# Patient Record
Sex: Male | Born: 1965 | Race: Black or African American | Hispanic: No | Marital: Single
Health system: Midwestern US, Community
[De-identification: ages and names within clinical notes are randomized; demographics above are authoritative.]

## PROBLEM LIST (undated history)

## (undated) ENCOUNTER — Emergency Department: Payer: Medicaid Other

## (undated) DIAGNOSIS — G459 Transient cerebral ischemic attack, unspecified: Secondary | ICD-10-CM

## (undated) DIAGNOSIS — F1010 Alcohol abuse, uncomplicated: Secondary | ICD-10-CM

## (undated) DIAGNOSIS — I5189 Other ill-defined heart diseases: Secondary | ICD-10-CM

## (undated) DIAGNOSIS — I10 Essential (primary) hypertension: Secondary | ICD-10-CM

## (undated) DIAGNOSIS — F1410 Cocaine abuse, uncomplicated: Secondary | ICD-10-CM

## (undated) DIAGNOSIS — F259 Schizoaffective disorder, unspecified: Secondary | ICD-10-CM

## (undated) DIAGNOSIS — I2699 Other pulmonary embolism without acute cor pulmonale: Secondary | ICD-10-CM

## (undated) DIAGNOSIS — E119 Type 2 diabetes mellitus without complications: Secondary | ICD-10-CM

## (undated) DIAGNOSIS — Z720 Tobacco use: Secondary | ICD-10-CM

## (undated) DIAGNOSIS — I639 Cerebral infarction, unspecified: Secondary | ICD-10-CM

## (undated) DIAGNOSIS — F250 Schizoaffective disorder, bipolar type: Secondary | ICD-10-CM

---

## 2013-03-12 LAB — METABOLIC PANEL, COMPREHENSIVE
A-G Ratio: 0.9 — ABNORMAL LOW (ref 1.0–3.1)
ALT (SGPT): 37 U/L (ref 12.0–78.0)
AST (SGOT): 16 U/L (ref 15–37)
Albumin: 3.4 g/dL (ref 3.40–5.00)
Alk. phosphatase: 130 U/L — ABNORMAL HIGH (ref 46–116)
Anion gap: 12 mmol/L (ref 10–17)
BUN/Creatinine ratio: 19 (ref 6.0–20.0)
BUN: 23 MG/DL — ABNORMAL HIGH (ref 7–18)
Bilirubin, total: 0.1 MG/DL — ABNORMAL LOW (ref 0.20–1.00)
CO2: 28 mmol/L (ref 21–32)
Calcium: 8.6 MG/DL (ref 8.5–10.1)
Chloride: 102 mmol/L (ref 98–107)
Creatinine: 1.2 MG/DL (ref 0.6–1.3)
GFR est AA: >60 mL/min/{1.73_m2} (ref >60)
GFR est non-AA: >60 mL/min/{1.73_m2} (ref >60)
Globulin: 3.6 g/dL
Glucose: 183 mg/dL — ABNORMAL HIGH (ref 74–106)
Potassium: 4.2 mmol/L (ref 3.50–5.10)
Protein, total: 7 g/dL (ref 6.40–8.20)
Sodium: 138 mmol/L (ref 136–145)

## 2013-08-09 LAB — URINALYSIS W/ RFLX MICROSCOPIC
Bilirubin: NEGATIVE
Blood: NEGATIVE
Glucose: NEGATIVE mg/dL
Ketone: NEGATIVE mg/dL
Leukocyte Esterase: NEGATIVE
Nitrites: NEGATIVE
Protein: 30 mg/dL — AB
Specific gravity: 1.03
Urobilinogen: 0.2 EU/dL (ref 0.2–1.0)
pH (UA): 6 (ref 5.0–7.0)

## 2013-08-09 LAB — METABOLIC PANEL, COMPREHENSIVE
A-G Ratio: 1.2 (ref 1.0–3.1)
ALT (SGPT): 29 U/L (ref 12.0–78.0)
AST (SGOT): 22 U/L (ref 15–37)
Albumin: 3.6 g/dL (ref 3.40–5.00)
Alk. phosphatase: 88 U/L (ref 46–116)
Anion gap: 13 mmol/L (ref 10–17)
BUN/Creatinine ratio: 12 (ref 6.0–20.0)
BUN: 18 MG/DL (ref 7–18)
Bilirubin, total: 0.5 MG/DL (ref 0.20–1.00)
CO2: 27 mmol/L (ref 21–32)
Calcium: 8.3 MG/DL — ABNORMAL LOW (ref 8.5–10.1)
Chloride: 98 mmol/L (ref 98–107)
Creatinine: 1.5 MG/DL — ABNORMAL HIGH (ref 0.6–1.3)
GFR est AA: >60 mL/min/{1.73_m2} (ref >60)
GFR est non-AA: 53 mL/min/{1.73_m2} — ABNORMAL LOW (ref >60)
Globulin: 3 g/dL
Glucose: 127 mg/dL — ABNORMAL HIGH (ref 74–106)
Potassium: 4 mmol/L (ref 3.50–5.10)
Protein, total: 6.6 g/dL (ref 6.40–8.20)
Sodium: 134 mmol/L — ABNORMAL LOW (ref 136–145)

## 2013-08-09 LAB — CBC W/O DIFF
HCT: 38.2 % (ref 36.8–45.2)
HGB: 12.5 g/dL — ABNORMAL LOW (ref 12.8–15.0)
MCH: 29.6 PG (ref 27–31)
MCHC: 32.7 g/dL (ref 32–36)
MCV: 90.5 FL (ref 81–99)
MPV: 9.4 FL (ref 7.4–10.4)
PLATELET: 208 10*3/uL (ref 140–450)
RBC: 4.22 M/uL (ref 4.0–5.2)
RDW: 14 % (ref 11.5–14.5)
WBC: 11.2 10*3/uL — ABNORMAL HIGH (ref 4.8–10.8)

## 2013-08-09 LAB — URINE MICROSCOPIC
Casts: NONE SEEN /lpf
Crystals, urine: NONE SEEN /LPF

## 2013-08-13 ENCOUNTER — Inpatient Hospital Stay
Admit: 2013-08-13 | Discharge: 2013-08-15 | Payer: MEDICAID | Attending: Infectious Disease | Admitting: Infectious Disease

## 2013-08-13 DIAGNOSIS — A419 Sepsis, unspecified organism: Secondary | ICD-10-CM

## 2013-08-13 MED ORDER — IPRATROPIUM BROMIDE 0.02 % SOLN FOR INHALATION
0.02 % | RESPIRATORY_TRACT | Status: AC
Start: 2013-08-13 — End: 2013-08-13
  Administered 2013-08-13: via RESPIRATORY_TRACT

## 2013-08-13 MED ORDER — ALBUTEROL SULFATE 0.083 % (0.83 MG/ML) SOLN FOR INHALATION
2.5 mg /3 mL (0.083 %) | RESPIRATORY_TRACT | Status: AC
Start: 2013-08-13 — End: 2013-08-13
  Administered 2013-08-13: via RESPIRATORY_TRACT

## 2013-08-13 MED ORDER — DIPHENHYDRAMINE HCL 50 MG/ML IJ SOLN
50 mg/mL | INTRAMUSCULAR | Status: AC
Start: 2013-08-13 — End: 2013-08-13
  Administered 2013-08-14: 01:00:00 via INTRAVENOUS

## 2013-08-13 MED FILL — ALBUTEROL SULFATE 0.083 % (0.83 MG/ML) SOLN FOR INHALATION: 2.5 mg /3 mL (0.083 %) | RESPIRATORY_TRACT | Qty: 3

## 2013-08-13 MED FILL — IPRATROPIUM BROMIDE 0.02 % SOLN FOR INHALATION: 0.02 % | RESPIRATORY_TRACT | Qty: 2.5

## 2013-08-13 MED FILL — CLEOCIN 900 MG/50 ML IN 5 % DEXTROSE INTRAVENOUS PIGGYBACK: 900 mg/50 mL | INTRAVENOUS | Qty: 50

## 2013-08-13 NOTE — ED Notes (Addendum)
Evaluated by Dr. Guillermina CityW. Robinson. Awaiting PICC line placement by vascular access RN.

## 2013-08-13 NOTE — ED Notes (Signed)
Assumed care of pt.  Report rec'd from Laura G. RN.  No change in status. No new orders. VSS. NAD noted.  Pt resting c no c/o @ present time.  awaiting provider reeval/dispo.

## 2013-08-13 NOTE — Progress Notes (Signed)
PICC Placement Note    PRE-PROCEDURE VERIFICATION  Correct Procedure: yes  Correct Site:  yes  Temperature: Temp: 98.4 ??F (36.9 ??C), Temperature Source: Temp Source: Oral  No results for input(s): BUN, CREA, PLT, INR, WBC in the last 72 hours.    Invalid input(s): PLTEXT, INREXT, APTHR  Allergies: Fish derived; Captopril; Glyburide; Haldol; Lovastatin; and Metoprolol    Education materials for PICC Care given to family: no. See Patient Education activity for further details.  PICC Booklet placed on chart: yes    PROCEDURE DETAIL  START TIME:     20:01  STOP TIME:       20:10  Utilizing realtime ultrasound guidance.A double lumen PICC line was started for vascular access. The following documentation is in addition to the PICC properties in the lines/airways flowsheet :  Lot #: [id number]  xylocaine used: yes, 1ML  Mid-Arm Circumference: 51 (cm)  Internal Catheter Length: 48 (cm)  External Catheter Length: 1 (cm)  Total Catheter Length: 49 (cm)  Vein Selection for PICC:right brachial  Central Line Bundle followed yes  Complication Related to Insertion: none    The placement was verified by X-ray: yes. The x-ray results state the tip location is on the right side and the tip overlies the lower superior vena cava. YES    Line is okay to use: yes    FIDEL Retail buyerVECINO BSN, RN, [facility]-BC, CRNI

## 2013-08-13 NOTE — ED Notes (Signed)
Bloods drwan from PICC line. CXR done to confirm placement.

## 2013-08-13 NOTE — ED Notes (Signed)
[facility] to [facility] out [facility]. States he has had left lower leg pain and swelling for 4 days, has been getting abx and Lovenox without any relief.

## 2013-08-13 NOTE — ED Provider Notes (Signed)
HPI Comments: Willie Powell is a 48 y.o. male who [facility] of left leg pain and swelling onset 5 days ago. Pt states that he has taken abx twice with no relief. Denies hx of DVT or cellulitis.       Patient is a 48 y.o. male presenting with lower extremity edema. The history is provided by the patient and medical records.   Leg Swelling   This is a new problem. The current episode started more than 2 days ago (5 days ago). The problem occurs constantly. The problem has not changed since onset.The pain is present in the left lower leg. The pain is moderate. Treatments tried: antibiotics  The treatment provided no relief. There has been no history of extremity trauma.        Past Medical History   Diagnosis Date   ??? Asthma    ??? Hypertension    ??? Diabetes (HCC)    ??? Psychiatric disorder      bipolar   ??? Hyperlipemia         History reviewed. No pertinent past surgical history.      History reviewed. No pertinent family history.     History     Social History   ??? Marital Status: SINGLE     Spouse Name: N/A     Number of Children: N/A   ??? Years of Education: N/A     Occupational History   ??? Not on file.     Social History Main Topics   ??? Smoking status: Never Smoker    ??? Smokeless tobacco: Not on file   ??? Alcohol Use: No   ??? Drug Use: No   ??? Sexual Activity: Not on file     Other Topics Concern   ??? Not on file     Social History Narrative   ??? No narrative on file                  ALLERGIES: Fish derived; Captopril; Glyburide; Haldol; Lovastatin; and Metoprolol      Review of Systems   Constitutional: Negative.  Negative for fever and chills.   HENT: Negative.  Negative for congestion and sore throat.    Respiratory: Negative.  Negative for cough and shortness of breath.    Cardiovascular: Positive for leg swelling (right leg). Negative for chest pain and palpitations.   Gastrointestinal: Negative.  Negative for nausea, vomiting, abdominal pain and diarrhea.    Genitourinary: Negative.  Negative for dysuria and frequency.   Musculoskeletal: Negative.  Negative for myalgias and arthralgias.        Right leg pain    Skin: Negative.  Negative for pallor and rash.   Allergic/Immunologic: Negative.  Negative for immunocompromised state.   Neurological: Negative.  Negative for weakness, light-headedness and headaches.   All other systems reviewed and are negative.      Filed Vitals:    08/13/13 1518   BP: 146/92   Pulse: 92   Temp: 98.1 ??F (36.7 ??C)   Resp: 18   Height: 5\' 7"  (1.702 m)   Weight: 124.739 kg (275 lb)   SpO2: 95%            Physical Exam   Constitutional: He is oriented to person, place, and time. He appears well-developed and well-nourished.   HENT:   Head: Normocephalic and atraumatic.   Eyes: Conjunctivae and EOM are normal. Pupils are equal, round, and reactive to light. Right  eye exhibits no discharge. Left eye exhibits no discharge. No scleral icterus.   Neck: Normal range of motion. Neck supple.   Cardiovascular: Normal rate, regular rhythm and normal heart sounds.  Exam reveals no gallop and no friction rub.    No murmur heard.  Pulmonary/Chest: Effort normal. No respiratory distress. He has wheezes (scattered, expiratory). He has no rales.   Abdominal: Soft. Bowel sounds are normal. He exhibits no distension. There is no tenderness. There is no rebound and no guarding.   Genitourinary:   Tenderness in left groin    Musculoskeletal: Normal range of motion. He exhibits edema.   edema to the distal portion of left lower leg up to the knee, hyperemia above the knee    Neurological: He is alert and oriented to person, place, and time.   Skin: Skin is warm and dry. Rash noted. No erythema.   Large urticarial plaques on lateral aspect of both elbows and upper arms laterally    Psychiatric: He has a normal mood and affect. His behavior is normal. Judgment and thought content normal.   Nursing note and vitals reviewed.       MDM   Number of Diagnoses or Management Options  Diagnosis management comments: 48 y.o. male with 4-5 day hx of progressive left foot, ankle and leg pain and edema. [facility] and was [facility] here for workup and [facility], Dr. Dwyane DeeAtnafu, was called and agrees to admit the pt. Incidentally, pt was found to have bronchial spasm. Pt has hx of asthma.     DDx  Cellulitis  DVT      Plan:   Labs: CBC, metabolic panel, lactic acid  UA  CT left leg   CXR  Abx: Vancomycin, Clindamycin  Benadryl   -----  5:41 PM  Documented by Ileene RubensLashaunda B Johnson, acting as a scribe for Dr. Johnna AcostaWalesia L Yerania Chamorro, MD      PROVIDER ATTESTATION:  11:43 PM    The entirety of this note, signed by me, accurately reflects all works, treatments, procedures, and medical decision making performed by me, Johnna AcostaWalesia L Arwen Haseley, MD.              Patient Progress  Patient progress: stable    Diagnostic Studies:  CXR 7:32 PM    Viewed by me     Interpreted by me   No acute disease    Abnormal : see below    Discussed with       Radiologist      Normal mediastinum  No infiltrates   Normal heart size   Interpretation: questionable borderline cardiomegaly, no focal infiltrate    Lab Data:  Labs Reviewed   CULTURE, BLOOD   CBC WITH AUTOMATED DIFF   METABOLIC PANEL, COMPREHENSIVE   LACTIC ACID, PLASMA   PROTHROMBIN TIME + INR   PTT   URINALYSIS W/ RFLX MICROSCOPIC   POC GLUCOSE         ED Course:   6:18 PM  Pt initially evaluated.     6:25 PM  Medical records reviwed. Sx began 4-5 days ago.     6:34 PM  Dr. Dwyane DeeAtnafu called. He wants a PICC line, double antibiotic therapy, CT of leg and admission.     6:41 PM  Spoke with PICC line access team. They agree to come down an put in a PICC line.     Procedures

## 2013-08-14 LAB — D-DIMER, QUANTITATIVE: D DIMER,DDMR: 1190 NG/ML — ABNORMAL HIGH (ref <400)

## 2013-08-14 LAB — HIV 1/2 SCREEN - L & D EXPOSURE
HIV RAPID SCR 1/2,RHIV12: NEGATIVE
HIV-1/2 rapid screen: NEGATIVE

## 2013-08-14 LAB — URINALYSIS W/ RFLX MICROSCOPIC
Bilirubin: NEGATIVE
Blood: NEGATIVE
Glucose: 100 mg/dL — AB
Ketone: NEGATIVE mg/dL
Leukocyte Esterase: NEGATIVE
Nitrites: NEGATIVE
Protein: NEGATIVE mg/dL
Specific gravity: 1.025 (ref 1.003–1.030)
Urobilinogen: 0.2 EU/dL (ref 0.2–1.0)
pH (UA): 6.5 (ref 5.0–7.0)

## 2013-08-14 LAB — EKG, 12 LEAD, INITIAL
Atrial Rate: 71 {beats}/min
Atrial Rate: 73 {beats}/min
Calculated P Axis: 38 degrees
Calculated P Axis: 47 degrees
Calculated R Axis: -11 degrees
Calculated R Axis: -15 degrees
Calculated T Axis: -2 degrees
Calculated T Axis: 0 degrees
Diagnosis: NORMAL
Diagnosis: NORMAL
P-R Interval: 174 ms
P-R Interval: 172 ms
Q-T Interval: 400 ms
Q-T Interval: 374 ms
QRS Duration: 104 ms
QRS Duration: 112 ms
QTC Calculation (Bezet): 434 ms
QTC Calculation (Bezet): 412 ms
Ventricular Rate: 71 {beats}/min
Ventricular Rate: 73 {beats}/min

## 2013-08-14 LAB — CBC WITH AUTOMATED DIFF
ABS. BASOPHILS: 0 10*3/uL (ref 0.0–0.2)
ABS. EOSINOPHILS: 0.2 10*3/uL (ref 0.0–0.7)
ABS. LYMPHOCYTES: 2.3 10*3/uL (ref 1.2–3.4)
ABS. MONOCYTES: 0.7 10*3/uL — ABNORMAL LOW (ref 1.1–3.2)
ABS. NEUTROPHILS: 4.5 10*3/uL (ref 1.4–6.5)
BASOPHILS: 0 % (ref 0–2)
EOSINOPHILS: 2 % (ref 0–5)
HCT: 34.6 % — ABNORMAL LOW (ref 36.8–45.2)
HGB: 11.7 g/dL — ABNORMAL LOW (ref 12.8–15.0)
IMMATURE GRANULOCYTES: 0.5 % (ref 0.0–5.0)
LYMPHOCYTES: 30 % (ref 16–40)
MCH: 29.1 PG (ref 27–31)
MCHC: 33.8 g/dL (ref 32–36)
MCV: 86.1 FL (ref 81–99)
MONOCYTES: 9 % (ref 0–12)
MPV: 9.4 FL (ref 7.4–10.4)
NEUTROPHILS: 59 % (ref 40–70)
PLATELET: 221 10*3/uL (ref 140–450)
RBC: 4.02 M/uL (ref 4.0–5.2)
RDW: 13 % (ref 11.5–14.5)
WBC: 7.7 10*3/uL (ref 4.8–10.8)

## 2013-08-14 LAB — METABOLIC PANEL, COMPREHENSIVE
A-G Ratio: 0.7 — ABNORMAL LOW (ref 1.0–3.1)
ALT (SGPT): 41 U/L (ref 12.0–78.0)
AST (SGOT): 35 U/L (ref 15–37)
Albumin: 2.9 g/dL — ABNORMAL LOW (ref 3.40–5.00)
Alk. phosphatase: 110 U/L (ref 46–116)
Anion gap: 12 mmol/L (ref 10–17)
BUN/Creatinine ratio: 11 (ref 6.0–20.0)
BUN: 12 MG/DL (ref 7–18)
Bilirubin, total: 0.3 MG/DL (ref 0.20–1.00)
CO2: 29 mmol/L (ref 21–32)
Calcium: 8.4 MG/DL — ABNORMAL LOW (ref 8.5–10.1)
Chloride: 100 mmol/L (ref 98–107)
Creatinine: 1.1 MG/DL (ref 0.6–1.3)
GFR est AA: >60 mL/min/{1.73_m2} (ref >60)
GFR est non-AA: >60 mL/min/{1.73_m2} (ref >60)
Globulin: 3.9 g/dL
Glucose: 135 mg/dL — ABNORMAL HIGH (ref 74–106)
Potassium: 3.2 mmol/L — ABNORMAL LOW (ref 3.50–5.10)
Protein, total: 6.8 g/dL (ref 6.40–8.20)
Sodium: 138 mmol/L (ref 136–145)

## 2013-08-14 LAB — GLUCOSE, POC
Glucose (POC): 152 mg/dL — ABNORMAL HIGH (ref 74–106)
Glucose (POC): 108 mg/dL — ABNORMAL HIGH (ref 74–106)
Glucose (POC): 150 mg/dL — ABNORMAL HIGH (ref 74–106)

## 2013-08-14 LAB — PTT: aPTT: 29.6 s (ref 24.5–31.6)

## 2013-08-14 LAB — LACTIC ACID: Lactic acid: 1.3 MMOL/L (ref 0.4–2.0)

## 2013-08-14 LAB — PROTHROMBIN TIME + INR
INR: 1.1
Prothrombin time: 11.1 s (ref 9.8–12.0)

## 2013-08-14 LAB — D DIMER: D-dimer: 1190 NG/ML — ABNORMAL HIGH (ref <400)

## 2013-08-14 MED ORDER — SODIUM CHLORIDE 0.9 % IJ SYRG
Freq: Once | INTRAMUSCULAR | Status: AC
Start: 2013-08-14 — End: 2013-08-13
  Administered 2013-08-14: 03:00:00 via INTRAVENOUS

## 2013-08-14 MED ORDER — IOPAMIDOL 61 % IV SOLN
300 mg iodine /mL (61 %) | Freq: Once | INTRAVENOUS | Status: AC
Start: 2013-08-14 — End: 2013-08-13
  Administered 2013-08-14: 03:00:00 via INTRAVENOUS

## 2013-08-14 MED ORDER — SODIUM CHLORIDE 0.9 % IV
10 gram | Freq: Two times a day (BID) | INTRAVENOUS | Status: DC
Start: 2013-08-14 — End: 2013-08-15
  Administered 2013-08-14: 21:00:00 via INTRAVENOUS

## 2013-08-14 MED ORDER — PHARMACY VANCOMYCIN NOTE
Status: DC | PRN
Start: 2013-08-14 — End: 2013-08-15

## 2013-08-14 MED ORDER — CLOTRIMAZOLE 1 % TOPICAL CREAM
1 % | CUTANEOUS | Status: AC
Start: 2013-08-14 — End: ?

## 2013-08-14 MED ADMIN — potassium chloride (K-DUR, KLOR-CON) SR tablet 40 mEq: ORAL | @ 21:00:00 | NDC 68084036011

## 2013-08-14 MED ADMIN — clindamycin (CLEOCIN) 900mg D5W 50mL IVPB (premix): INTRAVENOUS | @ 01:00:00 | NDC 00009338202

## 2013-08-14 MED ADMIN — cloNIDine HCl (CATAPRES) tablet 0.1 mg: ORAL | @ 21:00:00 | NDC 62584065711

## 2013-08-14 MED ADMIN — sodium chloride (NS) flush 5-10 mL: INTRAVENOUS | @ 21:00:00 | NDC 87701099893

## 2013-08-14 MED ADMIN — cloNIDine HCl (CATAPRES) tablet 0.1 mg: ORAL | @ 12:00:00 | NDC 62584065711

## 2013-08-14 MED ADMIN — dextrose 5% and 0.9% NaCl infusion: INTRAVENOUS | @ 12:00:00 | NDC 00338008904

## 2013-08-14 MED ADMIN — oxyCODONE-acetaminophen (PERCOCET) 5-325 mg per tablet 2 Tab: ORAL | @ 21:00:00 | NDC 68084035511

## 2013-08-14 MED ADMIN — oxyCODONE-acetaminophen (PERCOCET) 5-325 mg per tablet 2 Tab: ORAL | @ 12:00:00 | NDC 68084035511

## 2013-08-14 MED ADMIN — amLODIPine (NORVASC) tablet 10 mg: ORAL | @ 12:00:00 | NDC 68084026011

## 2013-08-14 MED ADMIN — 0.9% sodium chloride infusion: INTRAVENOUS | @ 16:00:00 | NDC 00338004904

## 2013-08-14 MED ADMIN — enoxaparin (LOVENOX) injection 120 mg: SUBCUTANEOUS | @ 16:00:00 | NDC 00703861021

## 2013-08-14 MED ADMIN — acetaminophen (TYLENOL) tablet 650 mg: ORAL | @ 12:00:00 | NDC 50580050110

## 2013-08-14 MED ADMIN — aspirin delayed-release tablet 81 mg: ORAL | @ 12:00:00 | NDC 00603002622

## 2013-08-14 MED ADMIN — sodium chloride (NS) flush 5-10 mL: INTRAVENOUS | @ 12:00:00 | NDC 87701099893

## 2013-08-14 MED ADMIN — oxyCODONE-acetaminophen (PERCOCET) 5-325 mg per tablet 2 Tab: ORAL | @ 16:00:00 | NDC 68084035511

## 2013-08-14 MED ADMIN — vancomycin (VANCOCIN) 1,000 mg in dextrose 5% (MBP/ADV) 250 mL adv: INTRAVENOUS | @ 01:00:00 | NDC 00409653501

## 2013-08-14 MED FILL — DEXTROSE 5% IN WATER (D5W) IV PIGGY BACK: 5 % | INTRAVENOUS | Qty: 250

## 2013-08-14 MED FILL — OXYCODONE-ACETAMINOPHEN 5 MG-325 MG TAB: 5-325 mg | ORAL | Qty: 2

## 2013-08-14 MED FILL — DEXTROSE 5% IN NORMAL SALINE IV: INTRAVENOUS | Qty: 1000

## 2013-08-14 MED FILL — PHARMACY VANCOMYCIN NOTE: Qty: 1

## 2013-08-14 MED FILL — TYLENOL 325 MG TABLET: 325 mg | ORAL | Qty: 2

## 2013-08-14 MED FILL — ENOXAPARIN 120 MG/0.8 ML SUB-Q SYRINGE: 120 mg/0.8 mL | SUBCUTANEOUS | Qty: 0.8

## 2013-08-14 MED FILL — CLONIDINE 0.1 MG TAB: 0.1 mg | ORAL | Qty: 1

## 2013-08-14 MED FILL — CARBAMAZEPINE 200 MG TAB: 200 mg | ORAL | Qty: 1

## 2013-08-14 MED FILL — BD POSIFLUSH NORMAL SALINE 0.9 % INJECTION SYRINGE: INTRAMUSCULAR | Qty: 10

## 2013-08-14 MED FILL — VANCOMYCIN 10 GRAM IV SOLR: 10 gram | INTRAVENOUS | Qty: 2000

## 2013-08-14 MED FILL — INSULIN ASPART 100 UNIT/ML INJECTION: 100 unit/mL | SUBCUTANEOUS | Qty: 1

## 2013-08-14 MED FILL — ASPIRIN 81 MG TAB, DELAYED RELEASE: 81 mg | ORAL | Qty: 1

## 2013-08-14 MED FILL — SODIUM CHLORIDE 0.9 % IV: INTRAVENOUS | Qty: 1000

## 2013-08-14 MED FILL — NORTRIPTYLINE 50 MG CAP: 50 mg | ORAL | Qty: 2

## 2013-08-14 MED FILL — AMLODIPINE 10 MG TAB: 10 mg | ORAL | Qty: 1

## 2013-08-14 MED FILL — KLOR-CON M20 MEQ TABLET,EXTENDED RELEASE: 20 mEq | ORAL | Qty: 2

## 2013-08-14 MED FILL — CLOTRIMAZOLE 1 % TOPICAL CREAM: 1 % | CUTANEOUS | Qty: 15

## 2013-08-14 MED FILL — ISOVUE-300  61 % INTRAVENOUS SOLUTION: 300 mg iodine /mL (61 %) | INTRAVENOUS | Qty: 100

## 2013-08-14 MED FILL — DIPHENHYDRAMINE HCL 50 MG/ML IJ SOLN: 50 mg/mL | INTRAMUSCULAR | Qty: 1

## 2013-08-14 NOTE — Progress Notes (Signed)
[facility] at 0330 am with a diagnosis of cellulitis and pain of the left lower leg. Patient has a history of Asthma, DM, HTN, Bipolar disorder.  Patient is alert and oriented x 3 lung sounds are clear upon ascultation.  IV PICC line in upper right arm, hep locked at this time.  Patient did not complain of pain, his leg has some swelling but is intact and open to air.  Patient has been oriented to the unit and the call light is within reach.  Will continue to monitor and follow plan of care already in place for this patient.

## 2013-08-14 NOTE — ED Notes (Signed)
No change in status. No new orders. VSS. NAD noted.  Pt resting c no c/o @ present time.  awaiting provider reeval/dispo.

## 2013-08-14 NOTE — H&P (Signed)
DATE OF ADMISSION: 08/14/2013    REASON FOR ADMISSION: Cellulitis of the right lower extremity with  lymphangitis and lymphadenitis on the same side.    HISTORY OF PRESENT ILLNESS: The patient is a 48 year old, African American  man. He has a past complicated medical history that includes hypertension,  obesity, diabetes mellitus, and depression with no suicidal or homicidal  ideation. He was complaining of pain in the right groin area 2 weeks ago.  Later it was followed by swelling of the lower extremity. [facility] He was started on oral antibiotic. Despite that, the  selling continued to get worse and got worse. I was consulted  on telemedicine about him. We put him in the infirmary, started him on  vancomycin. However, because of lack of IV access, he was switched to oral  Zyvox. Despite that, over the weekend, Dr. Jill PolingSisay, and on yesterday, Dr.  Girtha HakeAyalew told me that he was not doing good. Because of that, he was sent to the ER.  I discussed the case with Dr. Guillermina CityW. Robinson in the emergency room. She was  also concerned for a possible necrotizing infection. CAT scan was done and  it was not revealing a necrotizing infection. He will have venous Doppler.  The right leg is by far swollen than the left one. He started already from  the get go on Lovenox 120 mg subcutaneous daily so far with no sign of  bleeding. I will continue the IV vancomycin as he has a PICC line now. He  is also on clindamycin as well. Based on the venous Doppler, will make a  final decision later. All of his symptoms are associated with fever and  chills. On admission, he was tachycardic.    PAST MEDICAL HISTORY: Diabetes mellitus, hypertension, obesity, depression  and dyslipidemia.    PAST SURGICAL HISTORY: None.    MEDICATIONS AS AN OUTPATIENT  Include:  1. Amlodipine.  2. Aspirin.  3. Tegretol.  4. Clonidine.  5. Lovenox.  6. Zyvox.  7. NovoLog sliding scale.  8. Nortriptyline.  9. Senna.  10. Zocor.   11. Levemir.    ALLERGIES TO MEDICATIONS  1. FISH.  2. CAPTOPRIL.  3. GLYBURIDE.  4. HALDOL.  5. LOVASTATIN.  6. METOPROLOL.    PERSONAL HISTORY: He is single. He has 4 kids; they are all healthy. He  used to smoke cigarettes and socially drink alcohol, and quit in 1989.  Denies use of any drugs or intravenous drugs years ago.    PERSONAL HISTORY: Both his parents, his 4 brothers, and 1 sister are all  living. Denied any medical problem including clot forming or pulmonary  embolism.    REVIEW OF SYSTEMS: A 14-point review of systems asked and answered, and the  pertinent findings are the ones I mentioned in HPI.    PHYSICAL EXAMINATION  GENERAL: Not acutely sick-looking man, comfortable, lying on bed in no form  of cardiorespiratory distress.  VITAL SIGNS: Blood pressure 147/102 mmHg, pulse of 74 per minute,  respiratory rate of 20 per minute, saturating 94% to 100% on room air with  a T-max of 98.5 degrees Fahrenheit.  SKIN: Warm, dry. No rash or itch mark noted.  HEAD: Atraumatic, normocephalic.  EYES: Pink conjunctivae, anicteric sclerae. Extraocular muscles were  intact.  ENT: No sinus tenderness. No stuffy nose. Clear oropharynx and poor  dentition.  NECK: Supple. No JVP. No carotid bruit. No thyromegaly. No  lymphadenopathy.  AXILLAE: Free  of lymphadenopathy.  CHEST: Symmetrical, resonant, clear to auscultation.  HEART: S1, S2 regular. There was no gallop or murmur or rub.  ABDOMEN: Obese. It moves with respiration. It is soft, nontender. Bowel  sounds active. No mass or organomegaly.  GENITOURINARY: No CVA or suprapubic tenderness. No focal tenderness along  the spine and in the inguinal area. The left side of the inguinal area is  normal. The right side inguinal area is puffy and I could not feel any  lymphadenopathy; however, the area is very tender and a little bit warm and  red. There is sign of lymphangitis on the lower extremity on the right side   with massive swelling, erythema and warmth on the right side of the lower  extremity. Distal pulses are intact. Bilateral lower extremities athlete's  foot signs with cracking between the toes was noted.  NEUROLOGIC: He is awake, alert, oriented x3, and grossly nonfocal.    LABORATORY FINDINGS: His white blood cell count is 7.7, with 59%  neutrophils, 30% lymphocytes, 9% monocytes. Hemoglobin is 11.7, hematocrit  is 34.6, and platelet count is 221. Urinalysis is negative. INR is 1.1.  Sodium is 138, potassium is 3.2, bicarbonate is 29, glucose 135, BUN of 12,  creatinine 1.1, and bilirubin of 0.3, protein 6.8, albumin 2.9, ALT of 41,  lactic acid is 1.3.    CT scan of the lower extremity, still the results are pending. Chest x-ray  was reported as normal. A venous Doppler of the lower extremity is also  pending.    IMPRESSION  1. Sepsis.  2. Cellulitis with lymphangitis and lymphadenitis of the right lower  extremity.  3. Tinea pedis.  4. Obesity.  5. Hypertension.  6. Diabetes mellitus.  7. Dyslipidemia.  8. Hypokalemia.  9. Depression with no suicidal or homicidal ideation.    RECOMMENDATIONS  1. PICC line is already placed in.  2. IV hydration.  3. Will give him IV vancomycin and clindamycin.  4. Venous Doppler of the lower extremity.  5. Follow the CAT scan results.  6. Based on these results, there is a possibility that he may be discharged  as early as this afternoon.        Pauleen Goleman        Dictated byShelda Pal: [Shanard Treto]  Job#: [[id number]]: [[id number]]  DD: [08/14/2013 10:04 A]  DT: [08/14/2013 10:45 A]  Transcribed by: [[email or web address]]    cc:            Halina AndreasGedion Harolyn Cocker

## 2013-08-14 NOTE — Progress Notes (Signed)
Bedside and Verbal shift change report given to Patricia Drake RN (oncoming nurse) by ZAKIYA  KASUMBA-MUHANGI, RN (offgoing nurse). Report included the following information SBAR, Kardex and MAR.

## 2013-08-14 NOTE — Other (Signed)
Bedside and Verbal shift change report given to henya johnson rn (oncoming nurse) by patdrakern (offgoing nurse). Report included the following information SBAR and Kardex.   Pending discharge report have to be call to receiving facilty

## 2013-08-14 NOTE — H&P (Signed)
Admission note dictated # [id number]

## 2013-08-14 NOTE — Progress Notes (Signed)
Assumed care of this patient at 1915 in no acute distress. Pt is for discharge.  Ambulance to arrive shortly.  Ambulance was on back log due to the number of ambulance discharges that we had for these late discharges.  Pt with RUE DL PICC and [facility].  Report was given to [employer] RN.  Pt appears stable.  Recent low grade temp however pt has cellulitis and if for long term IV Vancomycin which has been started.  Safety maintained.  [facility].  Safety maintained.

## 2013-08-14 NOTE — Progress Notes (Signed)
Consult for Vancomycin Dosing by Pharmacy by Dr. Dwyane DeeAtnafu  Indication: Cellulitis with lymphangitis and Lymphadenitis of rt lower extremity  Target Level:10-15  Day of Therapy :2  Previous Regimen 1000 mg x 1 dose on 7/6   Last Level None   Other Current Antibiotics None   Significant Cultures None   Serum Creatinine Lab Results   Component Value Date/Time    CREATININE 1.10 08/13/2013 08:45 PM       Creatinine Clearance Estimated Creatinine Clearance: 104 mL/min (based on Cr of 1.1).   BUN Lab Results   Component Value Date/Time    BUN 12 08/13/2013 08:45 PM       WBC Lab Results   Component Value Date/Time    WBC 7.7 08/13/2013 08:45 PM       H/H Lab Results   Component Value Date/Time    HGB 11.7 08/13/2013 08:45 PM      Platelets Lab Results   Component Value Date/Time    PLATELET 221 08/13/2013 08:45 PM      Temp Temp: 97.7 ??F (36.5 ??C) ()       Dose administration notes:   Doses given appropriately as scheduled    Plan for level:Trough level on 17:30 pm on 7/8  Adjustments:  Start Vancomycin 2,000 mg q12h  Plan:  Continue to monitor

## 2013-08-14 NOTE — Other (Signed)
TRANSFER - OUT REPORT:    Verbal report given to [facility] RN(name) on Willie Powell  [facility] for routine progression of care.  Report consisted of patient???s Situation, Background, Assessment and   Recommendations(SBAR).     Information from the following report(s) SBAR, ED Summary, [facility] and Recent Results was reviewed with the receiving nurse.    Lines:   PICC Double Lumen 08/13/13 Right;Brachial (Active)        Opportunity for questions and clarification was provided.      Patient transported with:  Transport

## 2013-08-14 NOTE — Other (Signed)
Alert D-DIMER 1190. Dr Dwyane DeeAtnafu made aware order stat doppler. Pt left leg  remains red raised and swollen. Continue on lovenox  120 mg . Potassium 3.2 will receive k replacement.

## 2013-08-14 NOTE — Progress Notes (Signed)
Vascular lab preliminary report:      No evidence of DVT seen in the left lower extremity by duplex scan.

## 2013-08-14 NOTE — Discharge Summary (Signed)
Patient Discharge Summary    Willie Powell / [id number] DOB: 10/28/1965    Admitted 08/13/2013 Discharged: 08/14/2013     Patient Active Problem List   Diagnosis Code   ??? Cellulitis and abscess of leg, except foot 682.6   ??? Sepsis (HCC) 038.9, 995.91   ??? Hypokalemia 276.8     Plan:  Follow up as below  Vancomycin for 7 days  Elevate leg  ID consult on site  Lotrimin for 2 weeks for Athlet's foot    Procedures Done:  PICC INSERTION VASCULAR ACCESS TEAM  SALINE LOCK IV  INSERT PERIPHERAL IV  CT LOW EXT LT W CONT  XR CHEST PORT  XR CHEST PORT  DUPLEX LOWER EXT VENOUS LEFT  APPLY SEQUENTIAL COMPRESSION DEVICE  APPLY/MAINTAIN SEQUENTIAL COMPRESSION DEVICE  FULL CODE  IP CONSULT TO PHARMACY - VANCOMYCIN DOSING    Lab:  Recent Labs      08/13/13   2045   WBC  7.7   HGB  11.7*   HCT  34.6*   PLT  221     Recent Labs      08/13/13   2045   NA  138   K  3.2*   CL  100   CO2  29   GLU  135*   BUN  12   CREA  1.10   CA  8.4*   ALB  2.9*   SGOT  35   ALT  41     No components found for: GLPOC  No results for input(s): PH, PCO2, PO2, HCO3, FIO2 in the last 72 hours.  Recent Labs      08/13/13   2045   INR  1.1     Culture  No results found for: SDES, SREQ, CULT, RPT, GMS    Urine Culture  COLOR   Date Value Ref Range Status   08/13/2013 YELLOW   Final     APPEARANCE   Date Value Ref Range Status   08/13/2013 CLEAR   Final     SPECIFIC GRAVITY   Date Value Ref Range Status   08/09/2013 1.030   Final     PH (UA)   Date Value Ref Range Status   08/13/2013 6.5 5.0 - 7.0   Final     PROTEIN   Date Value Ref Range Status   08/13/2013 NEGATIVE  NEG mg/dL Final     KETONE   Date Value Ref Range Status   08/13/2013 NEGATIVE  NEG mg/dL Final     BILIRUBIN   Date Value Ref Range Status   08/13/2013 NEGATIVE  NEG   Final     BLOOD   Date Value Ref Range Status   08/13/2013 NEGATIVE  NEG   Final     UROBILINOGEN   Date Value Ref Range Status   08/13/2013 0.2 0.2 - 1.0 EU/dL Final     NITRITES   Date Value Ref Range Status    08/13/2013 NEGATIVE  NEG   Final     LEUKOCYTE ESTERASE   Date Value Ref Range Status   08/13/2013 NEGATIVE  NEG   Final       Urine Micro  WBC   Date Value Ref Range Status   08/09/2013 3-5 0 /hpf Final     RBC   Date Value Ref Range Status   08/09/2013 0-3 0 /hpf Final     EPITHELIAL CELLS   Date Value Ref Range Status   08/09/2013 FEW /hpf Final  BACTERIA   Date Value Ref Range Status   08/09/2013 FEW /hpf Final     CASTS   Date Value Ref Range Status   08/09/2013 NONE SEEN /lpf Final     CRYSTALS   Date Value Ref Range Status   08/09/2013 NONE SEEN /LPF Final     Radiology Studies:  Ct Low Ext Lt W Cont    08/14/2013   EXAM:  CT LOW EXT LT W CONT dated 08/13/2013 11:21 PM  INDICATION:   cellulitis of entire leg  COMPARISON:  None.  TECHNIQUE: Spiral CT through the left lower extremity from mid thigh to the foot was performed. Contrast material was     administered. 100 cc Isovue-300. 2.5 mm axial images with sagittal and coronal reconstructions produced. Preliminary report was given at the time of the exam.   FINDINGS:  There is diffuse subcutaneous edema consistent with the given diagnosis of cellulitis. This is mostly noted in the lower leg and foot. No focal fluid collection to indicate abscess. No distinct involvement of the deeper muscular tissues. No abnormal enhancement. The bones are unremarkable. There is a  tug lesion from the posterior distal femoral shaft at tendon insertion site.     08/14/2013   IMPRESSION: Diffuse subcutaneous edema. No focal abscess.     Xr Chest Port    08/14/2013   EXAM:  XR CHEST PORT dated 08/13/2013 8:19 PM  INDICATION:  Right Arm PICC placement, tip confirmation  COMPARISON: 08/13/13 at 1822 hrs.  FINDINGS: A portable AP erect radiograph of the chest was obtained at 1947 hours.      A PICC has been placed from the right. The tip is not well seen as it is projected over the spine. The tip appears to be in the lower SVC. Lungs remain clear. Mediastinum normal. No effusion.          08/14/2013   IMPRESSION:  No evidence of active disease. PICC tip not well seen but appears to be in the lower SVC.     Xr Chest Port    08/14/2013   EXAM:  XR CHEST PORT dated 08/13/2013 6:53 PM  INDICATION:  bronchospasm, chills  COMPARISON: None  FINDINGS: A portable AP erect radiograph of the chest was obtained at 1822 hours.      The lungs are normally expanded and clear of acute infiltrate.     Mediastinal structures are unremarkable. There is no effusion. Visualized bones are normal.              08/14/2013   IMPRESSION:  No evidence of active disease.         Isolation:   There are currently no Active Isolations    Lines:   Central Line    Allergy:   Allergies   Allergen Reactions   ??? Fish Derived Anaphylaxis   ??? Captopril Unknown (comments)   ??? Glyburide Other (comments)     Headache     ??? Haldol [Haloperidol Lactate] Unknown (comments)   ??? Lovastatin Other (comments)     Chest tightness   ??? Metoprolol Unknown (comments)     Medications:  Current Facility-Administered Medications   Medication Dose Route Frequency   ??? amLODIPine (NORVASC) tablet 10 mg  10 mg Oral DAILY   ??? aspirin delayed-release tablet 81 mg  81 mg Oral DAILY   ??? carBAMazepine (TEGretol) tablet 200 mg  200 mg Oral TID   ??? cloNIDine HCl (CATAPRES) tablet 0.1 mg  0.1 mg Oral  TID   ??? nitroglycerin (NITROSTAT) tablet 0.4 mg  0.4 mg SubLINGual Q5MIN PRN   ??? nortriptyline (PAMELOR) capsule 100 mg  100 mg Oral QHS   ??? simvastatin (ZOCOR) tablet 40 mg  40 mg Oral QHS   ??? senna (SENOKOT) tablet 8.6 mg  1 Tab Oral DAILY   ??? PERCOCET 5-325 mg per tablet 2 Tab  2 Tab Oral Q4H PRN   ??? insulin aspart (NOVOLOG) injection   SubCUTAneous AC&HS   ??? clotrimazole (LOTRIMIN) 1 % cream   Topical BID   ??? Vancomycin 2,000 mg in NS 500 mL IVPB  2,000 mg IntraVENous Q12H for 7 days       Recommended Diet:   Regular Diet    Recommended Activity:   Activity as tolerated    Follow up:  Follow-up Appointments   Procedures    ??? FOLLOW UP VISIT Appointment in: Other (Specify) Follow up with Primary Care Provider in 1-2 days.     Follow up with Primary Care Provider in 1-2 days.     Standing Status: Standing      Number of Occurrences: 1      Standing Expiration Date: 08/15/2013     Order Specific Question:  Appointment in     Answer:  Other (Specify)   ??? FOLLOW UP VISIT Appointment in: One Week ID specialist consult in one week     ID specialist consult in one week     Standing Status: Standing      Number of Occurrences: 1      Standing Expiration Date: 08/15/2013     Order Specific Question:  Appointment in     Answer:  One Week   ??? FOLLOW UP VISIT Appointment in: Other (Specify) CBC, CMP, Mg Q week for 2 weeks     CBC, CMP, Mg Q week for 2 weeks     Standing Status: Standing      Number of Occurrences: 1      Standing Expiration Date: 08/15/2013     Order Specific Question:  Appointment in     Answer:  Other [facility](Specify)     Hospital Course:  Willie Powell is a 48 y.o. year-old BLACK [place] male   He has a past complicated medical history that includes hypertension,  obesity, diabetes mellitus, and depression with no suicidal or homicidal  ideation. He was complaining of pain in the right groin area 2 weeks ago.  Later it was followed by swelling of the lower extremity. [facility] He was started on oral antibiotic. Despite that, the  selling continued to get worse and got worse. I was consulted  on telemedicine about him. We put him in the infirmary, started him on  vancomycin. However, because of lack of IV access, he was switched to oral  Zyvox. Despite that, over the weekend, Dr. Jill PolingSisay, and on yesterday, Dr.  Girtha HakeAyalew told me that he was not doing good. Because of that, he was sent to the ER.  I discussed the case with Dr. Guillermina CityW. Robinson in the emergency room. She was  also concerned for a possible necrotizing infection. CAT scan was done and   it was not revealing a necrotizing infection. Venous Doppler study was negative too.  I think he has bad cellulitis due to his athlete's foot and need to be treated aggressively.     A comprehensive review of systems was negative except for that written in the  HPI.     Physical Exam:     Patient Vitals for the past 12 hrs:   Temp Pulse Resp BP SpO2   08/14/13 1550 98.2 ??F (36.8 ??C) 79 18 128/85 mmHg 95 %   08/14/13 1134 97.7 ??F (36.5 ??C) 71 20 127/80 mmHg 96 %   08/14/13 0744 98 ??F (36.7 ??C) 73 20 147/102 mmHg 94 %       GENERAL: In no acute distress, comfortable, lying in bed.   SKIN: Warm, moist. Normal skin turgor. No rash or scratch mark noted.   HEENT: Normocephalic, Atraumatic,   Pink conjunctiva, Anicteric, EOMI,   No sinus tenderness or stuffy nose and   Clear & moist oral mucosa.   NECK: Supple, no JVD, no thyroidmegammy or lymphadenopathy.   AXILLA: No Lymphadenopathy.   LUNGS: Symmetrical, Resonant and CTA BL. No wheezes, rales,or rhonchi,   CARDIAC: S1 & S2, regular rate and rhythmn. No murmurs, rubs or gallops heard   ABDOMEN: Moves with respiration. Soft, NT, BS +, & no mass or organomegaly.   INGUINAL: No hernia or lymphadenopathy.   GUS: Genitals grossly intact. No supra pubic area tenderness. No CVAT.   EXTREM.: No leg edema and distal pulses intact on BL DP.  NEURO: AOX3, No obvious focal deficits. Sensation and strength grossly intact.   No astexis, Clonus or Babinski noted.     CODE STATUS:  Full code.    I have discussed the diagnosis and the treatment plan as well as the prognosis with the patient. I have discussed the type and duration of antibiotics to be administered. Potential side effects of these antibiotics like C. Diff colitis has been discussed with the patient as well.   Advised to be compliant with meds and follow ups.   Risks including death has been explained.   It is important that you take the medication exactly as they are prescribed.    Do not take other medications without consulting your doctor.   If you experience any worsening of your symptoms, please follow up with PMD  ASAP.      Signed By: Halina Andreas, MD     August 14, 2013

## 2013-08-15 LAB — GLUCOSE, POC: Glucose (POC): 174 mg/dL — ABNORMAL HIGH (ref 74–106)

## 2013-08-15 MED ADMIN — cloNIDine HCl (CATAPRES) tablet 0.1 mg: ORAL | @ 02:00:00 | NDC 62584065711

## 2013-08-15 MED ADMIN — oxyCODONE-acetaminophen (PERCOCET) 5-325 mg per tablet 2 Tab: ORAL | @ 02:00:00 | NDC 68084035511

## 2013-08-15 MED ADMIN — simvastatin (ZOCOR) tablet 40 mg: ORAL | @ 03:00:00 | NDC 68084051311

## 2013-08-15 MED ADMIN — nortriptyline (PAMELOR) capsule 100 mg: ORAL | @ 03:00:00 | NDC 00093081201

## 2013-08-15 MED ADMIN — sodium chloride (NS) flush 5-10 mL: INTRAVENOUS | @ 03:00:00 | NDC 87701099893

## 2013-08-15 MED FILL — CARBAMAZEPINE 200 MG TAB: 200 mg | ORAL | Qty: 1

## 2013-08-15 MED FILL — OXYCODONE-ACETAMINOPHEN 5 MG-325 MG TAB: 5-325 mg | ORAL | Qty: 2

## 2013-08-15 MED FILL — SIMVASTATIN 40 MG TAB: 40 mg | ORAL | Qty: 1

## 2013-08-15 MED FILL — CLONIDINE 0.1 MG TAB: 0.1 mg | ORAL | Qty: 1

## 2013-08-15 NOTE — Other (Signed)
Utilization Review          Systemic or Infectious Condition [facility] - Care Day 1 (08/14/2013) by Artist Pais, RN     08/14/2013 8:51 PM EDT by CABELL, CABELL      Subject: Admission     08/14/2013  v/s 98.1 146/92 92 18 95%  labs d dimer=1190  med note=48 y/o complaining of pain in the right groin area 2 weeks ago.Later it was followed by swelling of the lower extremity. He was [facility]. He was started on oral antibiotic. Despite that, theselling continued to get worse and got worse. However, because of lack of IV access, he was switched to oral Zyvox. Despite that, over the weekend, Dr. Jill Poling, and on yesterday, Dr.Ayalew told me that he was not doing good. Because of that, he was sent to the ER.I discussed the case with Dr. Guillermina City in the emergency room. She wasalso concerned for a possible necrotizing infection. CAT scan was done andit was not revealing a necrotizing infection. He will have venous Doppler.The right leg is by far swollen than the left one. I will continue the IV vancomycin as he has a PICC line now. He is also on clindamycin as well. Based on the venous Doppler, will make a final decision later.

## 2013-08-15 NOTE — Progress Notes (Signed)
[facility].  Pt leaving with RUE DL PICC for IV Vancomycin.  Discharge papers sent with ambo crew.  Safety maintained.

## 2013-08-17 LAB — METABOLIC PANEL, COMPREHENSIVE
A-G Ratio: 0.7 — ABNORMAL LOW (ref 1.0–3.1)
ALT (SGPT): 90 U/L — ABNORMAL HIGH (ref 12.0–78.0)
AST (SGOT): 60 U/L — ABNORMAL HIGH (ref 15–37)
Albumin: 2.8 g/dL — ABNORMAL LOW (ref 3.40–5.00)
Alk. phosphatase: 111 U/L (ref 46–116)
Anion gap: 12 mmol/L (ref 10–17)
BUN/Creatinine ratio: 11 (ref 6.0–20.0)
BUN: 14 MG/DL (ref 7–18)
Bilirubin, total: 0.2 MG/DL (ref 0.20–1.00)
CO2: 30 mmol/L (ref 21–32)
Calcium: 8.2 MG/DL — ABNORMAL LOW (ref 8.5–10.1)
Chloride: 101 mmol/L (ref 98–107)
Creatinine: 1.3 MG/DL (ref 0.6–1.3)
GFR est AA: >60 mL/min/{1.73_m2} (ref >60)
GFR est non-AA: >60 mL/min/{1.73_m2} (ref >60)
Globulin: 4.1 g/dL
Glucose: 190 mg/dL — ABNORMAL HIGH (ref 74–106)
Potassium: 4.2 mmol/L (ref 3.50–5.10)
Protein, total: 6.9 g/dL (ref 6.40–8.20)
Sodium: 139 mmol/L (ref 136–145)

## 2013-08-17 LAB — VANCOMYCIN, TROUGH: Vancomycin,trough: 22.2 ug/mL — ABNORMAL HIGH (ref 15–20)

## 2013-08-17 LAB — GENTAMICIN, TROUGH: Gentamicin, trough: 0.1 ug/ml — ABNORMAL LOW (ref 1–2)

## 2014-10-13 ENCOUNTER — Emergency Department
Admission: EM | Admit: 2014-10-13 | Discharge: 2014-10-13 | Disposition: A | Discharge status: Home / Self Care | Payer: Self-pay | Attending: Emergency Medicine | Admitting: Emergency Medicine

## 2014-10-13 ENCOUNTER — Encounter: Payer: Self-pay | Admitting: Medical Oncology

## 2014-10-13 DIAGNOSIS — I10 Essential (primary) hypertension: Secondary | ICD-10-CM | POA: Insufficient documentation

## 2014-10-13 DIAGNOSIS — E119 Type 2 diabetes mellitus without complications: Secondary | ICD-10-CM | POA: Insufficient documentation

## 2014-10-13 DIAGNOSIS — Z794 Long term (current) use of insulin: Secondary | ICD-10-CM | POA: Insufficient documentation

## 2014-10-13 DIAGNOSIS — E1165 Type 2 diabetes mellitus with hyperglycemia: Secondary | ICD-10-CM | POA: Insufficient documentation

## 2014-10-13 HISTORY — DX: Essential (primary) hypertension: I10

## 2014-10-13 HISTORY — DX: Type 2 diabetes mellitus without complications: E11.9

## 2014-10-13 LAB — CBC
HCT: 41.3 % (ref 40.0–52.0)
Hemoglobin: 14 g/dL (ref 13.0–18.0)
MCH: 31.8 pg (ref 26.0–34.0)
MCHC: 34 g/dL (ref 32.0–36.0)
MCV: 93.6 fL (ref 80.0–100.0)
Platelets: 200 10*3/uL (ref 150–440)
RBC: 4.41 MIL/uL (ref 4.40–5.90)
RDW: 13.7 % (ref 11.5–14.5)
WBC: 5.9 10*3/uL (ref 3.8–10.6)

## 2014-10-13 LAB — URINALYSIS COMPLETE WITH MICROSCOPIC (ARMC ONLY)
BILIRUBIN URINE: NEGATIVE
Bacteria, UA: NONE SEEN
Hgb urine dipstick: NEGATIVE
KETONES UR: NEGATIVE mg/dL
Leukocytes, UA: NEGATIVE
Nitrite: NEGATIVE
Protein, ur: NEGATIVE mg/dL
Specific Gravity, Urine: 1.041 — ABNORMAL HIGH (ref 1.005–1.030)
pH: 5 (ref 5.0–8.0)

## 2014-10-13 LAB — BASIC METABOLIC PANEL
Anion gap: 8 (ref 5–15)
BUN: 15 mg/dL (ref 6–20)
CALCIUM: 9.2 mg/dL (ref 8.9–10.3)
CO2: 27 mmol/L (ref 22–32)
CREATININE: 1.21 mg/dL (ref 0.61–1.24)
Chloride: 103 mmol/L (ref 101–111)
GFR calc non Af Amer: >60 mL/min (ref >60)
Glucose, Bld: 293 mg/dL — ABNORMAL HIGH (ref 65–99)
Potassium: 3.8 mmol/L (ref 3.5–5.1)
Sodium: 138 mmol/L (ref 135–145)

## 2014-10-13 LAB — GLUCOSE, CAPILLARY
Glucose-Capillary: 278 mg/dL — ABNORMAL HIGH (ref 65–99)
Glucose-Capillary: 209 mg/dL — ABNORMAL HIGH (ref 65–99)

## 2014-10-13 MED ORDER — HYDROCHLOROTHIAZIDE 25 MG PO TABS
25.0000 mg | ORAL_TABLET | Freq: Every day | ORAL | Status: DC
  Administered 2014-10-13: 25 mg via ORAL
  Filled 2014-10-13: qty 1

## 2014-10-13 MED ORDER — INSULIN DETEMIR 100 UNIT/ML ~~LOC~~ SOLN: 5.0000 [IU] | Freq: Every day | SUBCUTANEOUS | Status: DC

## 2014-10-13 NOTE — ED Provider Notes (Addendum)
[facility] Emergency Department Provider Note REMINDER - THIS NOTE IS NOT A FINAL MEDICAL RECORD UNTIL IT IS SIGNED. UNTIL THEN, THE CONTENT BELOW MAY REFLECT INFORMATION FROM A DOCUMENTATION TEMPLATE, NOT THE ACTUAL PATIENT VISIT. ____________________________________________  Time seen: Approximately 8:53 PM  I have reviewed the triage vital signs and the nursing notes.   HISTORY  Chief Complaint Hyperglycemia    HPI Henry Munoz is a 49 y.o. male history of diabetes. Also hypertension.  Patient reports he moved from [place] recently, been on Levemir for about a year for his diabetes and his blood sugars usually run about 200 using 5 or 8 units of Levemir nightly, but he ran out on Friday. He reports that he's been feeling increased thirst and more frequent urination since. He checked her blood sugar at home and it was elevated to over 400, and 1 reading greater than 500.  He comes without any other symptoms, denies any fevers or chills. No nausea or vomiting. He requested that they have a refill for Levemir and is seeking a primary care doctor.  Discussed blood pressure being elevated, the patient knows that he does have elevated blood pressure but did not take his medicine today. No headaches. No chest pain. No trouble breathing. No vision changes.  Past Medical History  Diagnosis Date  . Hypertension   . Diabetes mellitus without complication     There are no active problems to display for this patient.   History reviewed. No pertinent past surgical history.  Current Outpatient Rx  Name  Route  Sig  Dispense  Refill  . insulin detemir (LEVEMIR) 100 UNIT/ML injection   Subcutaneous   Inject 0.05 mLs (5 Units total) into the skin at bedtime.   10 mL   2     Allergies Review of patient's allergies indicates no known allergies.  No family history on file.  Social History Social History  Substance Use Topics  . Smoking status: Current  Every Day Smoker -- 0.50 packs/day    Types: Cigarettes  . Smokeless tobacco: None  . Alcohol Use: No    Review of Systems Constitutional: No fever/chills Eyes: No visual changes. ENT: No sore throat. Cardiovascular: Denies chest pain. Respiratory: Denies shortness of breath. Gastrointestinal: No abdominal pain.  No nausea, no vomiting.  No diarrhea.  No constipation. Genitourinary: Negative for dysuria. Increased urination. Increased thirst. Musculoskeletal: Negative for back pain. Skin: Negative for rash. Neurological: Negative for headaches, focal weakness or numbness.  10-point ROS otherwise negative.  ____________________________________________   PHYSICAL EXAM:  VITAL SIGNS: ED Triage Vitals  Enc Vitals Group     BP 10/13/14 1813 179/116 mmHg     Pulse Rate 10/13/14 1813 84     Resp 10/13/14 1813 20     Temp 10/13/14 1813 98.2 F (36.8 C)     Temp Source 10/13/14 1813 Oral     SpO2 10/13/14 1813 96 %     Weight 10/13/14 1813 280 lb (127.007 kg)     Height 10/13/14 1813 5\' 7"  (1.702 m)     Head Cir --      Peak Flow --      Pain Score --      Pain Loc --      Pain Edu? --      Excl. in GC? --    Constitutional: Alert and oriented. Well appearing and in no acute distress. Eyes: Conjunctivae are normal. PERRL. EOMI. Head: Atraumatic. Nose: No congestion/rhinnorhea. Mouth/Throat: Mucous membranes  are moist.  Oropharynx non-erythematous. Neck: No stridor.   Cardiovascular: Normal rate, regular rhythm. Grossly normal heart sounds.  Good peripheral circulation. Respiratory: Normal respiratory effort.  No retractions. Lungs CTAB. Gastrointestinal: Soft and nontender. No distention. No abdominal bruits. No CVA tenderness. Musculoskeletal: No lower extremity tenderness nor edema.  No joint effusions. Neurologic:  Normal speech and language. No gross focal neurologic deficits are appreciated. No gait instability. Skin:  Skin is warm, dry and intact. No rash  noted. Psychiatric: Mood and affect are normal. Speech and behavior are normal.  Essentially normal exam. Somewhat overweight. ____________________________________________   LABS (all labs ordered are listed, but only abnormal results are displayed)  Labs Reviewed  BASIC METABOLIC PANEL - Abnormal; Notable for the following:    Glucose, Bld 293 (*)    All other components within normal limits  URINALYSIS COMPLETEWITH MICROSCOPIC ([facility] ONLY) - Abnormal; Notable for the following:    Color, Urine YELLOW (*)    APPearance CLEAR (*)    Glucose, UA >500 (*)    Specific Gravity, Urine 1.041 (*)    Squamous Epithelial / LPF 0-5 (*)    All other components within normal limits  GLUCOSE, CAPILLARY - Abnormal; Notable for the following:    Glucose-Capillary 278 (*)    All other components within normal limits  GLUCOSE, CAPILLARY - Abnormal; Notable for the following:    Glucose-Capillary 209 (*)    All other components within normal limits  CBC  CBG MONITORING, ED   ____________________________________________  EKG   ____________________________________________  RADIOLOGY   ____________________________________________   PROCEDURES  Procedure(s) performed: None  Critical Care performed: No  ____________________________________________   INITIAL IMPRESSION / ASSESSMENT AND PLAN / ED COURSE  Pertinent labs & imaging results that were available during my care of the patient were reviewed by me and considered in my medical decision making (see chart for details).  Patient presents with polyuria and polydipsia he ran out of Levemir Friday. His blood sugar is less than 250, he has no anion gap, no evidence of acidosis or DKA.  I offered to place the patient back on insulin by starting in the ER, rather he would like to be given a prescription for his Levemir be discharged to fill it tonight. I discussed careful treatment of his diabetes, and follow up on his hypertension.  Patient is agreeable and will resume his normal home dosing of Levemir for which she states usually uses 5 units nightly, occasionally 8 units if his blood sugars are over 300.  Return precautions and signs and symptoms to return as well as signs and symptoms of hypoglycemia discussed with the patient is agreeable with plan.  I referred him to follow-up with [place] health care to assist in selecting an area primary care whom his insurance may be covered under. I have given information to follow-up with [facility] as well. ____________________________________________   FINAL CLINICAL IMPRESSION(S) / ED DIAGNOSES  Final diagnoses:  Hyperglycemia due to type 2 diabetes mellitus      Sharyn Creamer, MD 10/13/14 2056  Patient's blood pressure again noted to be quite elevated at the time of discharge. I discussed with the patient, he reports that he does have his blood pressure medication but he has not been taking it for the last 2 days. I offered him further blood pressure lowering, and discussed the risks of discharge with elevated blood pressure and the need for close follow-up. Patient agreeable to taking a blood pressure pill here before going  home, and understands the need for close follow-up. He reports that he does take his blood pressure, and is normally well controlled and he will follow-up closely and continue take his medication which she has been off the last 2 days. He does not require prescription for his HCTZ. No evidence of hypertensive emergency/urgency in the ER.  Sharyn Creamer, MD 10/13/14 2121

## 2014-10-13 NOTE — Discharge Instructions (Signed)
Please continue Levemir. Return to the emergency room right away if your blood sugar is persistently higher than 300, he developed nausea or vomiting, feel you're urinating frequently again, he feel lightheaded or shaky, or other new concerns arise.  Your blood pressure is also elevated, and I suggest she get close follow-up with primary care for repeat blood pressure monitoring and establishing care for diabetes care.  Insulin Treatment for Diabetes Diabetes is a disease that does not go away (chronic). It occurs when the body does not properly use the sugar (glucose) that is released from food after it is digested. Glucose levels are controlled by a hormone called insulin, which is made by your pancreas. Depending on the type of diabetes you have, either of the following will apply:   The pancreas does not make any insulin (type 1 diabetes).  The pancreas makes too little insulin, and the body cannot respond normally to the insulin that is made (type 2 diabetes). Without insulin, death can occur. However, with the addition of insulin, blood sugar monitoring, and treatment, someone with diabetes can live a full and productive life. This document will discuss the role of insulin in your treatment and provide information about its use.  HOW IS INSULIN GIVEN? Insulin is a medicine that can only be given by injection. Taking it by mouth makes it inactive because of the acid in your stomach. Insulin is injected under the skin by a syringe and needle, an insulin pen, a pump, or a jet injector. Your dose will be determined by your health care provider based on your individual needs. You will also be given guidance on which method of giving insulin is right for you. Remember that if you give insulin with a needle and syringe, you must do so using only a special insulin syringe made for this purpose. WHERE ON THE BODY SHOULD INSULIN BE INJECTED? Insulin is injected into the fatty layer of tissue just under  your skin. Good places to inject insulin include the upper arm, the front and outer area of the thigh, the hips, and the abdomen. Giving your insulin in the abdomen is preferred because this provides the most rapid and consistent absorption. Avoid the area 2 inches (5 cm) around the navel and avoid injecting into areas on your body with scar tissue. In addition, it is important to rotate your injection sites with every shot to prevent irritation and improve absorption. WHAT ARE THE DIFFERENT TYPES OF INSULIN?  If you have type 1 diabetes, you must take insulin to stay alive. Your body does not produce it. If you have type 2 diabetes, you might require insulin in addition to, or instead of, other medicines. In either case, proper use of insulin is critical to control your diabetes.  There are a number of different types of insulin. Usually, you will give yourself injections, though others can be trained to give them to you. Some people have an insulin pump that delivers insulin continuously through a tube (cannula) that is placed under the skin. Using insulin requires that you check your blood sugar several times a day. The exact number of times and time of day to check will vary depending on your type of diabetes, your type of insulin, and treatment goals. Your health care provider will direct you.  Generally, different insulins have different properties. The following is a general guide. Specifics will vary by product, and new products are introduced periodically.   Rapid-acting insulin starts working quickly (in as little  as 5 minutes) and wears off in 4 to 6 hours (sometimes longer). This type of insulin works well when taken just before a meal to bring your blood sugar quickly back to normal.   Short-acting insulin starts working in about 30 minutes and can last 6 to 10 hours. This type of insulin should be taken about 30 minutes before you start eating a meal.  Intermediate-acting insulin starts  working in 1-2 hours and wears off after about 10 to 18 hours. This insulin will lower your blood sugar for a longer period of time, but it will not be as effective in lowering your blood sugar right after a meal.   Long-acting insulin mimics the small amount of insulin that your pancreas usually produces throughout the day. You need to have some insulin present at all times. It is crucial to the metabolism of brain cells and other cells. Long-acting insulin is meant to be used either once or twice a day. It is usually used in combination with other types of insulin, or in combination with other diabetes medicines.  Discuss the type of insulin you are taking with your health care provider or pharmacist. You will then be aware of when the insulin can be expected to peak and when it will wear off. This is important to know so you can plan for meal times and periods of exercise.  Your health care provider will usually have a strategy in mind when treating you with insulin. This will vary with your type of diabetes, your diabetes treatment goals, and your health history. It is important that you understand this strategy so you can be an active partner in treating your diabetes. Here are some terms you might hear:   Basal insulin. This refers to the small amount of insulin that needs to be present in your blood at all times. Sometimes oral medicines will be enough. For other people, and especially for people with type 1 diabetes, insulin is needed. Usually, intermediate-acting or long-acting insulin is used once or twice a day to accomplish this.   Prandial (meal-related) insulin. Your blood sugar will rise rapidly after a meal. Rapid-acting or short-acting insulin can be used right before the meal to bring your blood sugar back to normal quickly. You might be instructed to adjust the amount of insulin depending on how much carbohydrate (starch) is in your meal.   Corrective insulin. You might be instructed  to check your blood sugar at certain times of the day. You then might use a small amount of rapid-acting or short-acting insulin to bring the blood sugar down to normal if it is elevated.   Tight control (also called intensive therapy). Tight control means keeping your blood sugar as close to your target as possible and keeping it from going too high after meals. People with tight control of their diabetes are shown to have fewer long-term problems from their diabetes.   Glycohemoglobin (also called glyco, glycosylated hemoglobin, hemoglobin A1c, or A1c) level. This measures how well your blood sugar has been controlled during the past 1 to 3 months. It helps your health care provider see how effective your treatment is and decide if any changes are needed. Your health care provider will discuss your target glycohemoglobin level with you.  Insulin treatment requires your careful attention. Treatment plans will be different for different people. Some people do well with a simple program. Others require more complicated programs, with multiple insulin injections daily. You will work with your health  care provider to develop the best program for you. Regardless of your insulin treatment plan, you must also do your best on weight control, diet and food choices, exercise, blood pressure control, cholesterol control, and stress levels.  WHAT ARE THE SIDE EFFECTS OF INSULIN? Although insulin treatment is important, it does have some side effects, such as:   Insulin can cause your blood sugar to go too low (hypoglycemia).   Weight gain can occur.   Improper injection technique can cause hypoglycemia, blood sugar to go too high (hyperglycemia), skin injury or irritation, or other problems. You must learn to inject insulin properly. Document Released: 04/23/2008 Document Revised: 06/11/2013 Document Reviewed: 07/09/2012 [facility] Patient Information 2015 [place], [place]. This information is not intended to  replace advice given to you by your health care provider. Make sure you discuss any questions you have with your health care provider.

## 2014-10-13 NOTE — ED Notes (Signed)
Pt reports that he has been urinating more frequently than usual and has been excessively thirsty, states that he has DM but has not taken his insulin in over a year. States that he checked his sugar at home and it was in the 500's.

## 2014-10-13 NOTE — ED Notes (Signed)
Patient give Hydrochlorothiazide  for BP.  States has his medication at home just did not take it today.  BP evaluated again prior to DC 188/124.

## 2014-10-13 NOTE — ED Notes (Signed)
In to DC patient BP 197/115, spoke with Dr. Fanny Bien patient to receive medication prior to DC.  Dr. Fanny Bien in to speak with patient.

## 2014-11-01 ENCOUNTER — Emergency Department
Admission: EM | Admit: 2014-11-01 | Discharge: 2014-11-01 | Disposition: A | Discharge status: Home / Self Care | Payer: Self-pay | Attending: Emergency Medicine | Admitting: Emergency Medicine

## 2014-11-01 ENCOUNTER — Encounter: Payer: Self-pay | Admitting: Emergency Medicine

## 2014-11-01 ENCOUNTER — Emergency Department: Payer: Self-pay

## 2014-11-01 DIAGNOSIS — Z794 Long term (current) use of insulin: Secondary | ICD-10-CM | POA: Insufficient documentation

## 2014-11-01 DIAGNOSIS — Z720 Tobacco use: Secondary | ICD-10-CM | POA: Insufficient documentation

## 2014-11-01 DIAGNOSIS — I10 Essential (primary) hypertension: Secondary | ICD-10-CM | POA: Insufficient documentation

## 2014-11-01 DIAGNOSIS — L03116 Cellulitis of left lower limb: Secondary | ICD-10-CM | POA: Insufficient documentation

## 2014-11-01 DIAGNOSIS — E119 Type 2 diabetes mellitus without complications: Secondary | ICD-10-CM | POA: Insufficient documentation

## 2014-11-01 LAB — COMPREHENSIVE METABOLIC PANEL
ALT: 24 U/L (ref 17–63)
ANION GAP: 8 (ref 5–15)
AST: 29 U/L (ref 15–41)
Albumin: 3.5 g/dL (ref 3.5–5.0)
Alkaline Phosphatase: 84 U/L (ref 38–126)
BUN: 16 mg/dL (ref 6–20)
CHLORIDE: 100 mmol/L — AB (ref 101–111)
CO2: 26 mmol/L (ref 22–32)
CREATININE: 1.13 mg/dL (ref 0.61–1.24)
Calcium: 8.9 mg/dL (ref 8.9–10.3)
Glucose, Bld: 257 mg/dL — ABNORMAL HIGH (ref 65–99)
POTASSIUM: 4.7 mmol/L (ref 3.5–5.1)
SODIUM: 134 mmol/L — AB (ref 135–145)
Total Bilirubin: 1 mg/dL (ref 0.3–1.2)
Total Protein: 7.3 g/dL (ref 6.5–8.1)

## 2014-11-01 LAB — CBC
HCT: 38.7 % — ABNORMAL LOW (ref 40.0–52.0)
Hemoglobin: 13 g/dL (ref 13.0–18.0)
MCH: 31.7 pg (ref 26.0–34.0)
MCHC: 33.6 g/dL (ref 32.0–36.0)
MCV: 94.4 fL (ref 80.0–100.0)
PLATELETS: 221 10*3/uL (ref 150–440)
RBC: 4.09 MIL/uL — AB (ref 4.40–5.90)
RDW: 13.3 % (ref 11.5–14.5)
WBC: 7.2 10*3/uL (ref 3.8–10.6)

## 2014-11-01 MED ORDER — ACETAMINOPHEN 500 MG PO TABS
ORAL_TABLET | ORAL | Status: AC
  Administered 2014-11-01: 1000 mg
  Filled 2014-11-01: qty 2

## 2014-11-01 MED ORDER — OXYCODONE-ACETAMINOPHEN 5-325 MG PO TABS: 1.0000 | ORAL_TABLET | Freq: Four times a day (QID) | ORAL | Status: DC | PRN

## 2014-11-01 MED ORDER — DOXYCYCLINE HYCLATE 100 MG PO CAPS: 100.0000 mg | ORAL_CAPSULE | Freq: Two times a day (BID) | ORAL | Status: DC

## 2014-11-01 MED ORDER — ACETAMINOPHEN 500 MG PO TABS: 1000.0000 mg | ORAL_TABLET | Freq: Once | ORAL | Status: AC

## 2014-11-01 MED ORDER — ACETAMINOPHEN 325 MG PO TABS: 650.0000 mg | ORAL_TABLET | Freq: Once | ORAL | Status: DC

## 2014-11-01 MED ORDER — DOXYCYCLINE HYCLATE 100 MG PO TABS
100.0000 mg | ORAL_TABLET | Freq: Once | ORAL | Status: AC
  Administered 2014-11-01: 100 mg via ORAL
  Filled 2014-11-01: qty 1

## 2014-11-01 NOTE — ED Provider Notes (Signed)
[facility] Emergency Department Provider Note  ____________________________________________  Time seen: 5:20 AM  I have reviewed the triage vital signs and the nursing notes.   HISTORY  Chief Complaint Cellulitis    HPI Henry Munoz is a 49 y.o. male who complains of redness swelling and pain of the left leg for about 2 days. He has a history of cellulitis and this seems exactly like it. Denies any recent travel trauma hospitalization or surgery. Review of previous notes that reveals that he has recent travel from [place]. Has a history of hypertension diabetes. Denies any recent wounds on the leg. No fever chills chest pain shortness breath abdominal pain nausea vomiting diarrhea.     Past Medical History  Diagnosis Date  . Hypertension   . Diabetes mellitus without complication      There are no active problems to display for this patient.    History reviewed. No pertinent past surgical history.   Current Outpatient Rx  Name  Route  Sig  Dispense  Refill  . insulin detemir (LEVEMIR) 100 UNIT/ML injection   Subcutaneous   Inject 0.05 mLs (5 Units total) into the skin at bedtime.   10 mL   2   . doxycycline (VIBRAMYCIN) 100 MG capsule   Oral   Take 1 capsule (100 mg total) by mouth 2 (two) times daily.   28 capsule   0   . oxyCODONE-acetaminophen (ROXICET) 5-325 MG per tablet   Oral   Take 1 tablet by mouth every 6 (six) hours as needed for severe pain.   12 tablet   0      Allergies Review of patient's allergies indicates no known allergies.   History reviewed. No pertinent family history.  Social History Social History  Substance Use Topics  . Smoking status: Current Every Day Smoker -- 0.50 packs/day    Types: Cigarettes  . Smokeless tobacco: None  . Alcohol Use: No    Review of Systems  Constitutional:   No fever or chills. No weight changes Eyes:   No blurry vision or double vision.  ENT:   No sore  throat. Cardiovascular:   No chest pain. Respiratory:   No dyspnea or cough. Gastrointestinal:   Negative for abdominal pain, vomiting and diarrhea.  No BRBPR or melena. Genitourinary:   Negative for dysuria, urinary retention, bloody urine, or difficulty urinating. Musculoskeletal:   Negative for back pain. Left leg pain and swelling and redness as above  Skin:   Left leg redness Neurological:   Negative for headaches, focal weakness or numbness. Psychiatric:  No anxiety or depression.   Endocrine:  No hot/cold intolerance, changes in energy, or sleep difficulty.  10-point ROS otherwise negative.  ____________________________________________   PHYSICAL EXAM:  VITAL SIGNS: ED Triage Vitals  Enc Vitals Group     BP 11/01/14 0325 187/109 mmHg     Pulse Rate 11/01/14 0325 83     Resp 11/01/14 0325 18     Temp 11/01/14 0325 99.1 F (37.3 C)     Temp Source 11/01/14 0325 Oral     SpO2 11/01/14 0325 97 %     Weight 11/01/14 0325 289 lb (131.09 kg)     Height 11/01/14 0325  (1.702 m)     Head Cir --      Peak Flow --      Pain Score 11/01/14 0326 9     Pain Loc --      Pain Edu? --  Excl. in GC? --      Constitutional:   Alert and oriented. Well appearing and in no distress. Energetic, no distress. Eyes:   No scleral icterus. No conjunctival pallor. PERRL. EOMI ENT   Head:   Normocephalic and atraumatic.   Nose:   No congestion/rhinnorhea. No septal hematoma   Mouth/Throat:   MMM, no pharyngeal erythema. No peritonsillar mass. No uvula shift.   Neck:   No stridor. No SubQ emphysema. No meningismus. Hematological/Lymphatic/Immunilogical:   No cervical lymphadenopathy. Cardiovascular:   RRR. Normal and symmetric distal pulses are present in all extremities. No murmurs, rubs, or gallops. Respiratory:   Normal respiratory effort without tachypnea nor retractions. Breath sounds are clear and equal bilaterally. No wheezes/rales/rhonchi. Gastrointestinal:    Soft and nontender. No distention. There is no CVA tenderness.  No rebound, rigidity, or guarding. Genitourinary:   deferred Musculoskeletal:   Left lower extremity edema, 1+ pitting, greater than the right. There is also redness in the shin area in between the knee and ankle on the left. It's tender to the touch. There is no crepitus or fluctuance. There is brawny erythema around the area suggestive of venous stasis disease. The right leg is essentially normal except for trace pitting edema. Neurologic:   Normal speech and language.  CN 2-10 normal. Motor grossly intact. No pronator drift.  Normal gait. No gross focal neurologic deficits are appreciated.  Skin:    Skin is warm, dry and intact. No rash noted.  No petechiae, purpura, or bullae. Psychiatric:   Mood and affect are normal. Speech and behavior are normal. Patient exhibits appropriate insight and judgment.  ____________________________________________    LABS (pertinent positives/negatives) (all labs ordered are listed, but only abnormal results are displayed) Labs Reviewed  CBC - Abnormal; Notable for the following:    RBC 4.09 (*)    HCT 38.7 (*)    All other components within normal limits  COMPREHENSIVE METABOLIC PANEL - Abnormal; Notable for the following:    Sodium 134 (*)    Chloride 100 (*)    Glucose, Bld 257 (*)    All other components within normal limits   ____________________________________________   EKG    ____________________________________________    RADIOLOGY  Ultrasound left lower extremity pending  ____________________________________________   PROCEDURES   ____________________________________________   INITIAL IMPRESSION / ASSESSMENT AND PLAN / ED COURSE  Pertinent labs & imaging results that were available during my care of the patient were reviewed by me and considered in my medical decision making (see chart for details).  We'll check ultrasound to evaluate for DVT of the  left leg. Otherwise we'll start the patient on doxycycline for a cellulitis of the leg. Although he has hypertension and diabetes, his vital signs are unremarkable and he looks very well and not in distress and nontoxic and suitable for a trial of outpatient therapy.  ----------------------------------------- 7:00 AM on 11/01/2014 -----------------------------------------  Patient remains hemodynamically stable. Symptoms are under control. Still awaiting ultrasound of the leg. Give the patient is signed out to oncoming physician Dr. Lenard Lance follow-up on the ultrasound. Patient is suitable for outpatient follow-up   ____________________________________________   FINAL CLINICAL IMPRESSION(S) / ED DIAGNOSES  Final diagnoses:  Cellulitis of left leg without foot      Sharman Cheek, MD 11/01/14 [phone]

## 2014-11-01 NOTE — ED Provider Notes (Signed)
-----------------------------------------   7:58 AM on 11/01/2014 -----------------------------------------  Ultrasound negative for DVT. We will discharge with doxycycline and Percocet. I discussed the findings with the patient who is agreeable to plan. Patient follow-up with his primary care physician.  Minna Antis, MD 11/01/14 [phone]

## 2014-11-01 NOTE — ED Notes (Signed)
Swelling and redness to left lower leg, hx of cellulitis a year ago.

## 2014-11-01 NOTE — ED Notes (Signed)
Pt in US at this time 

## 2014-11-01 NOTE — ED Notes (Signed)
Pt returns from US at this time.

## 2014-11-01 NOTE — Discharge Instructions (Signed)

## 2014-12-08 ENCOUNTER — Emergency Department
Admission: EM | Admit: 2014-12-08 | Discharge: 2014-12-08 | Disposition: A | Discharge status: Home / Self Care | Payer: Self-pay | Attending: Emergency Medicine | Admitting: Emergency Medicine

## 2014-12-08 ENCOUNTER — Encounter: Payer: Self-pay | Admitting: Emergency Medicine

## 2014-12-08 DIAGNOSIS — Z792 Long term (current) use of antibiotics: Secondary | ICD-10-CM | POA: Insufficient documentation

## 2014-12-08 DIAGNOSIS — N481 Balanitis: Secondary | ICD-10-CM | POA: Insufficient documentation

## 2014-12-08 DIAGNOSIS — Z720 Tobacco use: Secondary | ICD-10-CM | POA: Insufficient documentation

## 2014-12-08 DIAGNOSIS — I10 Essential (primary) hypertension: Secondary | ICD-10-CM | POA: Insufficient documentation

## 2014-12-08 DIAGNOSIS — E1365 Other specified diabetes mellitus with hyperglycemia: Secondary | ICD-10-CM | POA: Insufficient documentation

## 2014-12-08 DIAGNOSIS — Z794 Long term (current) use of insulin: Secondary | ICD-10-CM | POA: Insufficient documentation

## 2014-12-08 LAB — BASIC METABOLIC PANEL
Anion gap: 7 (ref 5–15)
BUN: 12 mg/dL (ref 6–20)
CALCIUM: 9.2 mg/dL (ref 8.9–10.3)
CO2: 27 mmol/L (ref 22–32)
CREATININE: 1 mg/dL (ref 0.61–1.24)
Chloride: 100 mmol/L — ABNORMAL LOW (ref 101–111)
GFR calc Af Amer: >60 mL/min (ref >60)
GFR calc non Af Amer: >60 mL/min (ref >60)
GLUCOSE: 497 mg/dL — AB (ref 65–99)
Potassium: 4.1 mmol/L (ref 3.5–5.1)
Sodium: 134 mmol/L — ABNORMAL LOW (ref 135–145)

## 2014-12-08 LAB — BLOOD GAS, VENOUS
Acid-Base Excess: 1.9 mmol/L (ref 0.0–3.0)
BICARBONATE: 29 meq/L — AB (ref 21.0–28.0)
Patient temperature: 37
pCO2, Ven: 55 mmHg (ref 44.0–60.0)
pH, Ven: 7.33 (ref 7.320–7.430)

## 2014-12-08 LAB — GLUCOSE, CAPILLARY
GLUCOSE-CAPILLARY: 400 mg/dL — AB (ref 65–99)
GLUCOSE-CAPILLARY: 305 mg/dL — AB (ref 65–99)
GLUCOSE-CAPILLARY: 327 mg/dL — AB (ref 65–99)
Glucose-Capillary: 477 mg/dL — ABNORMAL HIGH (ref 65–99)
Glucose-Capillary: 471 mg/dL — ABNORMAL HIGH (ref 65–99)
Glucose-Capillary: 398 mg/dL — ABNORMAL HIGH (ref 65–99)

## 2014-12-08 MED ORDER — INSULIN ASPART 100 UNIT/ML ~~LOC~~ SOLN
8.0000 [IU] | Freq: Once | SUBCUTANEOUS | Status: DC
  Filled 2014-12-08: qty 8

## 2014-12-08 MED ORDER — INSULIN ASPART 100 UNIT/ML ~~LOC~~ SOLN
4.0000 [IU] | Freq: Once | SUBCUTANEOUS | Status: AC
  Administered 2014-12-08: 4 [IU] via INTRAVENOUS
  Filled 2014-12-08: qty 4

## 2014-12-08 MED ORDER — SODIUM CHLORIDE 0.9 % IV BOLUS (SEPSIS)
1000.0000 mL | Freq: Once | INTRAVENOUS | Status: AC
Start: 2014-12-08 — End: 2014-12-08
  Administered 2014-12-08: 1000 mL via INTRAVENOUS

## 2014-12-08 MED ORDER — INSULIN ASPART 100 UNIT/ML ~~LOC~~ SOLN
6.0000 [IU] | Freq: Once | SUBCUTANEOUS | Status: AC
  Administered 2014-12-08: 6 [IU] via INTRAVENOUS

## 2014-12-08 MED ORDER — CLOTRIMAZOLE 1 % EX CREA: 1.0000 "application " | TOPICAL_CREAM | Freq: Two times a day (BID) | CUTANEOUS | Status: DC

## 2014-12-08 MED ORDER — CLOTRIMAZOLE 1 % EX CREA
TOPICAL_CREAM | CUTANEOUS | Status: AC
Start: 2014-12-08 — End: 2014-12-08
  Administered 2014-12-08: 16:00:00 via TOPICAL
  Filled 2014-12-08: qty 15

## 2014-12-08 NOTE — ED Notes (Signed)
Notified pharm of cream delay

## 2014-12-08 NOTE — ED Provider Notes (Signed)
[facility] Emergency Department Provider Note REMINDER - THIS NOTE IS NOT A FINAL MEDICAL RECORD UNTIL IT IS SIGNED. UNTIL THEN, THE CONTENT BELOW MAY REFLECT INFORMATION FROM A DOCUMENTATION TEMPLATE, NOT THE ACTUAL PATIENT VISIT. ____________________________________________  Time seen: Approximately 12:55 PM  I have reviewed the triage vital signs and the nursing notes.   HISTORY  Chief Complaint Hyperglycemia    HPI Henry Munoz is a 49 y.o. male history of insulin-dependent diabetes, hypertension.  Patient presents today states he's been having some blurring of his vision for last couple of days, he also notices that he gets these symptoms and his blood sugars begin running high. He supposed to be on Levemir, but reports that $400 was unaffordable.  Denies any nausea, vomiting, headache, fevers, chills. He has noticed increased urination over the last week. He reports that he is supposed to be taking 5 units of Levemir each evening and 20 units each morning, and that he is always use this twice daily.  No chest pain or trouble breathing. Denies loss of vision, just states his vision seems slightly off in blurry, having had these symptoms many times before with high sugar.   Past Medical History  Diagnosis Date  . Hypertension   . Diabetes mellitus without complication (HCC)     There are no active problems to display for this patient.   History reviewed. No pertinent past surgical history.  Current Outpatient Rx  Name  Route  Sig  Dispense  Refill  . clotrimazole (LOTRIMIN) 1 % cream   Topical   Apply 1 application topically 2 (two) times daily.   30 g   0   . doxycycline (VIBRAMYCIN) 100 MG capsule   Oral   Take 1 capsule (100 mg total) by mouth 2 (two) times daily.   28 capsule   0   . insulin detemir (LEVEMIR) 100 UNIT/ML injection   Subcutaneous   Inject 0.05 mLs (5 Units total) into the skin at bedtime.   10 mL   2   .  oxyCODONE-acetaminophen (ROXICET) 5-325 MG per tablet   Oral   Take 1 tablet by mouth every 6 (six) hours as needed for severe pain.   12 tablet   0     Allergies Review of patient's allergies indicates no known allergies.  No family history on file.  Social History Social History  Substance Use Topics  . Smoking status: Current Every Day Smoker -- 0.50 packs/day    Types: Cigarettes  . Smokeless tobacco: None  . Alcohol Use: No    Review of Systems Constitutional: No fever/chills Eyes: No visual changes for some slight blurring in both eyes. ENT: No sore throat. Cardiovascular: Denies chest pain. Respiratory: Denies shortness of breath. Gastrointestinal: No abdominal pain.  No nausea, no vomiting.  No diarrhea.  No constipation. Genitourinary: Negative for dysuria. Patient has noticed a slight amount of white crusting around the tip of the penis over the last week. Musculoskeletal: Negative for back pain. Skin: Negative for rash. Neurological: Negative for headaches, focal weakness or numbness.  10-point ROS otherwise negative.  ____________________________________________   PHYSICAL EXAM:  VITAL SIGNS: ED Triage Vitals  Enc Vitals Group     BP 12/08/14 1221 136/85 mmHg     Pulse Rate 12/08/14 1221 95     Resp 12/08/14 1221 14     Temp 12/08/14 1221 98.1 F (36.7 C)     Temp Source 12/08/14 1221 Oral     SpO2 12/08/14  1221 95 %     Weight 12/08/14 1221 274 lb (124.286 kg)     Height 12/08/14 1221 5\' 7"  (1.702 m)     Head Cir --      Peak Flow --      Pain Score 12/08/14 1239 8     Pain Loc --      Pain Edu? --      Excl. in GC? --    Constitutional: Alert and oriented. Well appearing and in no acute distress. Eyes: Conjunctivae are normal. PERRL. EOMI. Head: Atraumatic. Nose: No congestion/rhinnorhea. Extraocular movements normal. No posterior retinopathy or hemorrhages noted on ophthalmologic exam. Mouth/Throat: Mucous membranes are moist.  Oropharynx  non-erythematous. Neck: No stridor.   Cardiovascular: Normal rate, regular rhythm. Grossly normal heart sounds.  Good peripheral circulation. Respiratory: Normal respiratory effort.  No retractions. Lungs CTAB. Gastrointestinal: Soft and nontender. No distention. No abdominal bruits. No CVA tenderness. Normal scrotum and penile shaft, the patient is uncircumcised and has a slight amount of white discharge and slight ulcerations at the tip of the glans penis. he is able to extend and retract foreskin without issue. Musculoskeletal: No lower extremity tenderness nor edema.  No joint effusions. Neurologic:  Normal speech and language. No gross focal neurologic deficits are appreciated. No gait instability. Skin:  Skin is warm, dry and intact. No rash noted. Psychiatric: Mood and affect are normal. Speech and behavior are normal.  ____________________________________________   LABS (all labs ordered are listed, but only abnormal results are displayed)  Labs Reviewed  GLUCOSE, CAPILLARY - Abnormal; Notable for the following:    Glucose-Capillary 477 (*)    All other components within normal limits  BASIC METABOLIC PANEL - Abnormal; Notable for the following:    Sodium 134 (*)    Chloride 100 (*)    Glucose, Bld 497 (*)    All other components within normal limits  BLOOD GAS, VENOUS - Abnormal; Notable for the following:    Bicarbonate 29.0 (*)    All other components within normal limits  GLUCOSE, CAPILLARY - Abnormal; Notable for the following:    Glucose-Capillary 471 (*)    All other components within normal limits  GLUCOSE, CAPILLARY - Abnormal; Notable for the following:    Glucose-Capillary 400 (*)    All other components within normal limits  CBG MONITORING, ED  CBG MONITORING, ED   ____________________________________________  EKG   ____________________________________________  RADIOLOGY   ____________________________________________   PROCEDURES  Procedure(s)  performed: None  Critical Care performed: No  ____________________________________________   INITIAL IMPRESSION / ASSESSMENT AND PLAN / ED COURSE  Pertinent labs & imaging results that were available during my care of the patient were reviewed by me and considered in my medical decision making (see chart for details).  Vision presents for concerns of slight visual blurring, and polyuria over the last week. He most consistent with hyperglycemia, no evidence of stroke symptoms. Reassuring examination. We will check labs, apply insulin and fluids to improve hyperglycemia. Rule out DKA.  I did discuss with the patient that he needs his prescriptions filled, he still has his previous prescription for Levemir but has not filled it yet. Provided the patient with prescription discomfort program cards as well as we'll refer him to the [place] [facility] ([facility]) and [facility]. I printed and provided information for him to go tomorrow to see [facility] program and call [facility] for Levemir.  ----------------------------------------- 3:09 PM on 12/08/2014 -----------------------------------------  Patient blood sugars still  400 despite 2 doses of IV insulin, ordered an additional liter of fluid as well as an additional 6 units of insulin. Plan of care assigned to Dr. Inocencio Homes, once patient's glucose under 300 we will plan to discharge the patient to follow-up with medication assistance program and [facility]. Patient aware of the plan and return precautions. It is notable, the patient reports he still has his Levemir prescription from his previous ED visit which she can fill so I will not be giving him an additional prescription at this time. ____________________________________________   FINAL CLINICAL IMPRESSION(S) / ED DIAGNOSES  Final diagnoses:  Balanitis  Uncontrolled other specified diabetes mellitus with hyperglycemia, with long-term current use of insulin (HCC)      Sharyn Creamer,  MD 12/08/14 1510

## 2014-12-08 NOTE — ED Notes (Signed)
States he thinks his blood sugar is elevated ..and feels like swelling to groin area

## 2014-12-08 NOTE — ED Notes (Signed)
Pt reports history of diabetes. Pt states he has not had his insulin in 2 months. Blood glucose of 477 in triage. Pt states his blurry vision in both eye started this morning. Pt also reports redness and pain to the tip of his penis.

## 2014-12-08 NOTE — Discharge Instructions (Signed)
Balanitis Balanitis is inflammation of the head of the penis (glans).  CAUSES  Balanitis has multiple causes, both infectious and noninfectious. Often balanitis is the result of poor personal hygiene, especially in uncircumcised males. Without adequate washing, viruses, bacteria, and yeast collect between the foreskin and the glans. This can cause an infection. Lack of air and irritation from a normal secretion called smegma contribute to the cause in uncircumcised males. Other causes include:  Chemical irritation from the use of certain soaps and shower gels (especially soaps with perfumes), condoms, personal lubricants, petroleum jelly, spermicides, and fabric conditioners.  Skin conditions, such as eczema, dermatitis, and psoriasis.  Allergies to drugs, such as tetracycline and sulfa.  Certain medical conditions, including liver cirrhosis, congestive heart failure, and kidney disease.  Morbid obesity. RISK FACTORS  Diabetes mellitus.  A tight foreskin that is difficult to pull back past the glans (phimosis).  Sex without the use of a condom. SIGNS AND SYMPTOMS  Symptoms may include:  Discharge coming from under the foreskin.  Tenderness.  Itching and inability to get an erection (because of the pain).  Redness and a rash.  Sores on the glans and on the foreskin. DIAGNOSIS Diagnosis of balanitis is confirmed through a physical exam. TREATMENT The treatment is based on the cause of the balanitis. Treatment may include:  Frequent cleansing.  Keeping the glans and foreskin dry.  Use of medicines such as creams, pain medicines, antibiotics, or medicines to treat fungal infections.  Sitz baths. If the irritation has caused a scar on the foreskin that prevents easy retraction, a circumcision may be recommended.  HOME CARE INSTRUCTIONS  Sex should be avoided until the condition has cleared. MAKE SURE YOU:  Understand these instructions.  Will watch your  condition.  Will get help right away if you are not doing well or get worse.   This information is not intended to replace advice given to you by your health care provider. Make sure you discuss any questions you have with your health care provider.   Document Released: 06/13/2008 Document Revised: 01/30/2013 Document Reviewed: 07/17/2012 Elsevier Interactive Patient Education 2016 Elsevier Inc.  Type 1 Diabetes Mellitus, Adult Type 1 diabetes mellitus, often simply referred to as diabetes, is a long-term (chronic) disease. It occurs when the islet cells in the pancreas that make insulin (a hormone) are destroyed and can no longer make insulin. Insulin is needed to move sugars from food into the tissue cells. The tissue cells use the sugars for energy. In people with type 1 diabetes, the sugars build up in the blood instead of going into the tissue cells. As a result, high blood sugar (hyperglycemia) develops. Without insulin, the body breaks down fat cells for the needed energy. This breakdown of fat cells produces acid chemicals (ketones), which increases the acid levels in the body. The effect of either high ketone or high sugar (glucose) levels can be life-threatening.  Type 1 diabetes was also previously called juvenile diabetes. It most often occurs before the age of 18, but it can occur at any age. RISK FACTORS A person is predisposed to developing type 1 diabetes if someone in his or her family has the disease and is exposed to certain additional environmental triggers.  SYMPTOMS  Symptoms of type 1 diabetes may develop gradually over days to weeks or suddenly. The symptoms occur due to hyperglycemia. The symptoms can include:   Increased thirst (polydipsia).  Increased urination (polyuria).  Increased urination during the night (nocturia).  Weight  loss. This weight loss may be rapid.  Frequent, recurring infections.  Tiredness (fatigue).  Weakness.  Vision changes, such as  blurred vision.  Fruity smell to your breath.  Abdominal pain.  Nausea or vomiting.  An open skin wound (ulcer). DIAGNOSIS  Type 1 diabetes is diagnosed when symptoms of diabetes are present and when blood glucose levels are increased. Your blood glucose level may be checked by one or more of the following blood tests:  A fasting blood glucose test. You will not be allowed to eat for at least 8 hours before a blood sample is taken.  A random blood glucose test. Your blood glucose is checked at any time of the day regardless of when you ate.  A hemoglobin A1c blood glucose test. A hemoglobin A1c test provides information about blood glucose control over the previous 3 months. TREATMENT  Although type 1 diabetes cannot be prevented, it can be managed with insulin, diet, and exercise.  You will need to take insulin daily to keep blood glucose in the desired range.  You will need to match insulin dosing with exercise and healthy food choices. Generally, the goal of treatment is to maintain a pre-meal (preprandial) blood glucose level of 80-130 mg/dL. HOME CARE INSTRUCTIONS   Have your hemoglobin A1c level checked twice a year.  Perform daily blood glucose monitoring as directed by your health care provider.  Monitor urine ketones when you are ill and as directed by your health care provider.  Take your insulin as directed by your health care provider to maintain your blood glucose level in the desired range.  Never run out of insulin. It is needed every day.  Adjust insulin based on your intake of carbohydrates. Carbohydrates can raise blood glucose levels but need to be included in your diet. Carbohydrates provide vitamins, minerals, and fiber, which are an essential part of a healthy diet. Carbohydrates are found in fruits, vegetables, whole grains, dairy products, legumes, and foods containing added sugars.  Eat healthy foods. Alternate 3 meals with 3 snacks.  Maintain a  healthy weight.  Carry a medical alert card or wear your medical alert jewelry.  Carry a 15-gram carbohydrate snack with you at all times to treat low blood glucose (hypoglycemia). Some examples of 15-gram carbohydrate snacks include:  Glucose tablets, 3 or 4.  Glucose gel, 15-gram tube.  Raisins, 2 tablespoons (24 grams).  Jelly beans, 6.  Animal crackers, 8.  Fruit juice, regular soda, or low-fat milk, 4 ounces (120 mL).  Gummy treats, 9.  Recognize hypoglycemia. Hypoglycemia occurs with blood glucose levels of 70 mg/dL and below. The risk for hypoglycemia increases when fasting or skipping meals, during or after intense exercise, and during sleep. Hypoglycemia symptoms can include:  Tremors or shakes.  Decreased ability to concentrate.  Sweating.  Increased heart rate.  Headache.  Dry mouth.  Hunger.  Irritability.  Anxiety.  Restless sleep.  Altered speech or coordination.  Confusion.  Treat hypoglycemia promptly. If you are alert and able to safely swallow, follow the 15:15 rule:  Take 15-20 grams of rapid-acting glucose or carbohydrate. Rapid-acting options include glucose gel, glucose tablets, or 4 ounces (120 mL) of fruit juice, regular soda, or low-fat milk.  Check your blood glucose level 15 minutes after taking the glucose.  Take 15-20 grams more of glucose if the repeat blood glucose level is still 70 mg/dL or below.  Eat a meal or snack within 1 hour once blood glucose levels return to normal.  Be alert to polyuria and polydipsia, which are early signs of hyperglycemia. An early awareness of hyperglycemia allows for prompt treatment. Treat hyperglycemia as directed by your health care provider.  Exercise regularly as directed by your health care provider. This includes:  Stretching and performing strength training exercises, such as yoga or weight lifting, at least 2 times per week.  Performing a total of at least 150 minutes of  moderate-intensity exercise each week, such as brisk walking or water aerobics.  Exercising at least 3 days per week, making sure you allow no more than 2 consecutive days to pass without exercising.  Avoiding long periods of inactivity (90 minutes or more). When you have to spend an extended period of time sitting down, take frequent breaks to walk or stretch.  Adjust your insulin dosing and food intake as needed if you start a new exercise or sport.  Follow your sick-day plan at any time you are unable to eat or drink as usual.   Do not use any tobacco products including cigarettes, chewing tobacco, or electronic cigarettes. If you need help quitting, ask your health care provider.  Limit alcohol intake to no more than 1 drink per day for nonpregnant women and 2 drinks per day for men. You should drink alcohol only when you are also eating food. Talk with your health care provider about whether alcohol is safe for you. Tell your health care provider if you drink alcohol several times a week.  Keep all follow-up visits as directed by your health care provider.  Schedule an eye exam within 5 years of diagnosis and then annually.  Perform daily skin and foot care. Examine your skin and feet daily for cuts, bruises, redness, nail problems, bleeding, blisters, or sores. A foot exam should be done by a health care provider 5 years after diagnosis, and then every year after the first exam.  Brush your teeth and gums at least twice a day and floss at least once a day. Follow up with your dentist regularly.  Share your diabetes management plan with your workplace or school.  Keep your immunizations up to date. It is recommended that you receive a flu (influenza) vaccine every year. It is also recommended that you receive a pneumonia (pneumococcal) vaccine. If you are 62 years of age or older and have never received a pneumonia vaccine, this vaccine may be given as a series of two separate shots.  Ask your health care provider which additional vaccines may be recommended.  Learn to manage stress.  Obtain ongoing diabetes education and support as needed.  Participate in or seek rehabilitation as needed to maintain or improve independence and quality of life. Request a physical or occupational therapy referral if you are having foot or hand numbness, or difficulties with grooming, dressing, eating, or physical activity. SEEK MEDICAL CARE IF:   You are unable to eat food or drink fluids for more than 6 hours.  You have nausea and vomiting for more than 6 hours.  Your blood glucose level is over 240 mg/dL.  There is a change in mental status.  You develop an additional serious illness.  You have diarrhea for more than 6 hours.  You have been sick or have had a fever for a couple of days and are not getting better.  You have pain during any physical activity. SEEK IMMEDIATE MEDICAL CARE IF:  You have difficulty breathing.  You have moderate to large ketone levels. MAKE SURE YOU:  Understand these  instructions.  Will watch your condition.  Will get help right away if you are not doing well or get worse.   This information is not intended to replace advice given to you by your health care provider. Make sure you discuss any questions you have with your health care provider.   Document Released: 01/23/2000 Document Revised: 10/16/2014 Document Reviewed: 08/24/2011 Elsevier Interactive Patient Education [employer]2016 Elsevier Inc.

## 2014-12-08 NOTE — ED Provider Notes (Signed)
-----------------------------------------   4:35 PM on 12/08/2014 -----------------------------------------  Care was assumed from Dr. Fanny BienQuale pending improvement in the patient's hyperglycemia. At this time his glucose has improved to 327. He feels well, his vitals are stable and he is comfortable with discharge. We'll DC with return precautions as well as the prescriptions provided by Dr. Fanny BienQuale.  Gayla DossEryka A Donae Kueker, MD 12/08/14 [id number]

## 2014-12-08 NOTE — ED Notes (Signed)
BGL [id number]

## 2015-10-29 ENCOUNTER — Encounter: Payer: Self-pay | Admitting: Intensive Care

## 2015-10-29 ENCOUNTER — Emergency Department
Admission: EM | Admit: 2015-10-29 | Discharge: 2015-10-29 | Disposition: A | Discharge status: Home / Self Care | Payer: Self-pay | Attending: Emergency Medicine | Admitting: Emergency Medicine

## 2015-10-29 DIAGNOSIS — S39012A Strain of muscle, fascia and tendon of lower back, initial encounter: Secondary | ICD-10-CM | POA: Insufficient documentation

## 2015-10-29 DIAGNOSIS — Z794 Long term (current) use of insulin: Secondary | ICD-10-CM | POA: Insufficient documentation

## 2015-10-29 DIAGNOSIS — T7840XA Allergy, unspecified, initial encounter: Secondary | ICD-10-CM | POA: Insufficient documentation

## 2015-10-29 DIAGNOSIS — T148XXA Other injury of unspecified body region, initial encounter: Secondary | ICD-10-CM

## 2015-10-29 DIAGNOSIS — E119 Type 2 diabetes mellitus without complications: Secondary | ICD-10-CM | POA: Insufficient documentation

## 2015-10-29 DIAGNOSIS — H578 Other specified disorders of eye and adnexa: Secondary | ICD-10-CM | POA: Insufficient documentation

## 2015-10-29 DIAGNOSIS — I10 Essential (primary) hypertension: Secondary | ICD-10-CM | POA: Insufficient documentation

## 2015-10-29 DIAGNOSIS — Y990 Civilian activity done for income or pay: Secondary | ICD-10-CM | POA: Insufficient documentation

## 2015-10-29 DIAGNOSIS — Y929 Unspecified place or not applicable: Secondary | ICD-10-CM | POA: Insufficient documentation

## 2015-10-29 DIAGNOSIS — Y9389 Activity, other specified: Secondary | ICD-10-CM | POA: Insufficient documentation

## 2015-10-29 DIAGNOSIS — Z79899 Other long term (current) drug therapy: Secondary | ICD-10-CM | POA: Insufficient documentation

## 2015-10-29 DIAGNOSIS — F17210 Nicotine dependence, cigarettes, uncomplicated: Secondary | ICD-10-CM | POA: Insufficient documentation

## 2015-10-29 DIAGNOSIS — X500XXA Overexertion from strenuous movement or load, initial encounter: Secondary | ICD-10-CM | POA: Insufficient documentation

## 2015-10-29 LAB — CBC WITH DIFFERENTIAL/PLATELET
Basophils Absolute: 0 10*3/uL (ref 0–0.1)
Basophils Relative: 1 %
EOS ABS: 0 10*3/uL (ref 0–0.7)
Eosinophils Relative: 0 %
HEMATOCRIT: 41.5 % (ref 40.0–52.0)
HEMOGLOBIN: 14.4 g/dL (ref 13.0–18.0)
LYMPHS PCT: 10 %
Lymphs Abs: 0.7 10*3/uL — ABNORMAL LOW (ref 1.0–3.6)
MCH: 32.4 pg (ref 26.0–34.0)
MCHC: 34.7 g/dL (ref 32.0–36.0)
MCV: 93.3 fL (ref 80.0–100.0)
MONOS PCT: 7 %
Monocytes Absolute: 0.5 10*3/uL (ref 0.2–1.0)
NEUTROS PCT: 82 %
Neutro Abs: 5.7 10*3/uL (ref 1.4–6.5)
Platelets: 176 10*3/uL (ref 150–440)
RBC: 4.44 MIL/uL (ref 4.40–5.90)
RDW: 12.9 % (ref 11.5–14.5)
WBC: 7 10*3/uL (ref 3.8–10.6)

## 2015-10-29 LAB — BASIC METABOLIC PANEL
Anion gap: 7 (ref 5–15)
BUN: 10 mg/dL (ref 6–20)
CHLORIDE: 102 mmol/L (ref 101–111)
CO2: 26 mmol/L (ref 22–32)
CREATININE: 0.81 mg/dL (ref 0.61–1.24)
Calcium: 8.8 mg/dL — ABNORMAL LOW (ref 8.9–10.3)
GFR calc non Af Amer: >60 mL/min (ref >60)
Glucose, Bld: 258 mg/dL — ABNORMAL HIGH (ref 65–99)
POTASSIUM: 3.5 mmol/L (ref 3.5–5.1)
Sodium: 135 mmol/L (ref 135–145)

## 2015-10-29 LAB — URINALYSIS COMPLETE WITH MICROSCOPIC (ARMC ONLY)
BACTERIA UA: NONE SEEN
Bilirubin Urine: NEGATIVE
Glucose, UA: >500 mg/dL — AB
Hgb urine dipstick: NEGATIVE
Ketones, ur: NEGATIVE mg/dL
Leukocytes, UA: NEGATIVE
Nitrite: NEGATIVE
PH: 5 (ref 5.0–8.0)
PROTEIN: NEGATIVE mg/dL
Specific Gravity, Urine: 1.029 (ref 1.005–1.030)

## 2015-10-29 LAB — GLUCOSE, CAPILLARY: GLUCOSE-CAPILLARY: 277 mg/dL — AB (ref 65–99)

## 2015-10-29 MED ORDER — KETOROLAC TROMETHAMINE 30 MG/ML IJ SOLN
30.0000 mg | Freq: Once | INTRAMUSCULAR | Status: AC
  Administered 2015-10-29: 30 mg via INTRAMUSCULAR
  Filled 2015-10-29: qty 1

## 2015-10-29 MED ORDER — DIPHENHYDRAMINE HCL 50 MG/ML IJ SOLN
25.0000 mg | Freq: Once | INTRAMUSCULAR | Status: AC
  Administered 2015-10-29: 25 mg via INTRAVENOUS
  Filled 2015-10-29: qty 1

## 2015-10-29 MED ORDER — ACETAMINOPHEN 325 MG PO TABS
650.0000 mg | ORAL_TABLET | Freq: Once | ORAL | Status: AC
Start: 2015-10-29 — End: 2015-10-29
  Administered 2015-10-29: 650 mg via ORAL
  Filled 2015-10-29: qty 2

## 2015-10-29 MED ORDER — NAPROXEN 500 MG PO TABS: 500.0000 mg | ORAL_TABLET | Freq: Two times a day (BID) | ORAL | 0 refills | Status: DC

## 2015-10-29 MED ORDER — SODIUM CHLORIDE 0.9 % IV BOLUS (SEPSIS)
1000.0000 mL | Freq: Once | INTRAVENOUS | Status: AC
  Administered 2015-10-29: 1000 mL via INTRAVENOUS

## 2015-10-29 MED ORDER — CETIRIZINE HCL 10 MG PO TABS: 10.0000 mg | ORAL_TABLET | Freq: Every day | ORAL | 0 refills | Status: DC

## 2015-10-29 MED ORDER — FAMOTIDINE IN NACL 20-0.9 MG/50ML-% IV SOLN
20.0000 mg | Freq: Once | INTRAVENOUS | Status: AC
  Administered 2015-10-29: 20 mg via INTRAVENOUS
  Filled 2015-10-29: qty 50

## 2015-10-29 MED ORDER — DIPHENHYDRAMINE HCL 25 MG PO CAPS: 50.0000 mg | ORAL_CAPSULE | Freq: Four times a day (QID) | ORAL | 0 refills | Status: DC | PRN

## 2015-10-29 NOTE — ED Notes (Signed)
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

## 2015-10-29 NOTE — ED Triage Notes (Signed)
Pt presents to ER with fever and L buttocks pain that travels to lower back X3 days. 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 Temp in triage 99.1 oral. Denies falling. Pt has not had any medication today. States he has been taking Ibuprofen with minimal relief at home. Ambulatory in triage but reports pain during ambulation.

## 2015-10-29 NOTE — ED Provider Notes (Signed)
[facility] Emergency Department Provider Note  ____________________________________________  Time seen: Approximately 10:30 AM  I have reviewed the triage vital signs and the nursing notes.   HISTORY  Chief Complaint Fever and Tailbone Pain    HPI Henry Munoz is a 50 y.o. male, NAD, presents to the emergency department with complaint of left buttock pain that travels up to his left lower back x 3 days.  States the onset was sudden while he was at work, but does not recall any recent trauma, falls, or injury.  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  Patient describes pain as 10/10, sharp and constant.  Worse with walking and movement. He has tried ibuprofen and a muscle relaxant that he received from his mother, both of which only gave him temporary relief of pain. He denies pain radiation down the left leg.  Has had no numbness, weakness, tingling, saddle paresthesias nor loss of bowel or bladder control.  He denies change in bowel or bladder habits.  Has had fever and chills but has not checked his temperature. Reports waking with a headache "behind the eyes" that is achy.  Also notes his eyes were swollen and irritated when he woke this morning. Denies any drainage or matting of the eyes. Has not had any nasal congestion, runny nose, ear pain, sinus pressure. Denies chest pain, shortness breath, cough, chest congestion, abdominal pain, nausea, vomiting. Has not taken anything for eye irritation. Has not been exposed to any allergens. Has had no changes in lotions, soaps, detergents. Notes that he is diabetic and takes insulin twice daily. Last dose of insulin was yesterday morning. States that his blood sugars normally run in the 300s but yesterday it was 110. Has not checked his blood sugar today. States that [facility].   Past Medical History:  Diagnosis Date  . Diabetes mellitus without complication (HCC)   . Hypertension      There are no active problems to display for this patient.   History reviewed. No pertinent surgical history.  Prior to Admission medications   Medication Sig Start Date End Date Taking? Authorizing Provider  cetirizine (ZYRTEC) 10 MG tablet Take 1 tablet (10 mg total) by mouth daily. 10/29/15 11/28/15  Zarayah Lanting L Keilana Morlock, PA-C  clotrimazole (LOTRIMIN) 1 % cream Apply 1 application topically 2 (two) times daily. 12/08/14   Sharyn Creamer, MD  diphenhydrAMINE (BENADRYL) 25 mg capsule Take 2 capsules (50 mg total) by mouth every 6 (six) hours as needed for itching. 10/29/15 11/05/15  Ruthella Kirchman L Vladimir Lenhoff, PA-C  doxycycline (VIBRAMYCIN) 100 MG capsule Take 1 capsule (100 mg total) by mouth 2 (two) times daily. 11/01/14   Sharman Cheek, MD  insulin detemir (LEVEMIR) 100 UNIT/ML injection Inject 0.05 mLs (5 Units total) into the skin at bedtime. 10/13/14   Sharyn Creamer, MD  naproxen (NAPROSYN) 500 MG tablet Take 1 tablet (500 mg total) by mouth 2 (two) times daily with a meal. 10/29/15   Noeh Sparacino L Babbette Dalesandro, PA-C  oxyCODONE-acetaminophen (ROXICET) 5-325 MG per tablet Take 1 tablet by mouth every 6 (six) hours as needed for severe pain. 11/01/14   Sharman Cheek, MD    Allergies Review of patient's allergies indicates no known allergies.  History reviewed. No pertinent family history.  Social History Social History  Substance Use Topics  . Smoking status: Current Every Day Smoker    Packs/day: 0.50    Types: Cigarettes  . Smokeless tobacco: Never Used  .  Alcohol use 1.2 oz/week    2 Cans of beer per week     Comment: per week     Review of Systems  Constitutional: Positive for Subjective fevers, chills. Eyes: Positive bilateral eyelid swelling and irritation. No visual changes. No discharge, pain, redness ENT: No sore throat, nasal congestion, runny nose, sinus pressure, ear pain. Cardiovascular: No chest pain, palpitations. Respiratory: No cough. No shortness of breath. No wheezing Gastrointestinal: No  abdominal pain.  No nausea, vomiting.  No diarrhea.  No constipation.  Genitourinary: Negative for dysuria. No hematuria. No urinary hesitancy, urgency or increased frequency.   Musculoskeletal: Positive for lower left back pain and left buttock pain. Positive for decreased ROM of left hip and back.   Skin: Negative for skin sores, open wounds, oozing, weeping. Neurological: Positive for headache but no focal numbness, weakness, tingling. No saddle paresthesias. No loss of bowel or bladder control. No LOC, dizziness  10-point ROS otherwise negative.  ____________________________________________   PHYSICAL EXAM:  VITAL SIGNS: ED Triage Vitals  Enc Vitals Group     BP 10/29/15 0858 (!) 159/98     Pulse Rate 10/29/15 0858 97     Resp 10/29/15 0858 20     Temp 10/29/15 0858 99.1 F (37.3 C)     Temp Source 10/29/15 0858 Oral     SpO2 10/29/15 0858 96 %     Weight 10/29/15 0858 267 lb (121.1 kg)     Height 10/29/15 0858 5\' 7"  (1.702 m)     Head Circumference --      Peak Flow --      Pain Score 10/29/15 0859 10     Pain Loc --      Pain Edu? --      Excl. in GC? --      Constitutional: Alert and oriented. Appears in pain, but in no acute distress. Eyes: Conjunctivae are normal. PERRLA. EOMI without pain. Bilateral upper and lower eyelids with mild edema and irritation.  Head: Atraumatic.   ENT:      Ears: TMs visualized bilaterally without erythema, effusion, bulging, perforation      Nose: No congestion/rhinnorhea.      Mouth/Throat: Mucous membranes are moist. Airways patent. Uvula is midline. Neck: Upper with full range of motion. No stridor. Trachea is midline. Hematological/Lymphatic/Immunilogical: No cervical lymphadenopathy. Cardiovascular: Normal rate, regular rhythm. Normal S1 and S2.  Good peripheral circulation with 2+ pulses noted in bilateral lower extremities. Respiratory: Normal respiratory effort without tachypnea or retractions. Lungs CTAB sounds noted in all  lung fields. Musculoskeletal: No lower extremity tenderness nor edema.  Tenderness on palpation of left lumbar paraspinal regions and left buttock. No masses were palpated. No central thoracic, lumbar, sacral spinal tenderness. Increased pain in left lower back with raising the left leg but no radiation of pain nor true straight leg raise.   Neurologic:  Normal speech and language. No gross focal neurologic deficits are appreciated.  Skin:  Skin is warm, dry and intact. No rash, redness, swelling, bruising, skin sores noted about the back or buttocks. Psychiatric: Mood and affect are normal. Speech and behavior are normal. Patient exhibits appropriate insight and judgement.   ____________________________________________   LABS (all labs ordered are listed, but only abnormal results are displayed)  Labs Reviewed  BASIC METABOLIC PANEL - Abnormal; Notable for the following:       Result Value   Glucose, Bld 258 (*)    Calcium 8.8 (*)    All other components within  normal limits  CBC WITH DIFFERENTIAL/PLATELET - Abnormal; Notable for the following:    Lymphs Abs 0.7 (*)    All other components within normal limits  URINALYSIS COMPLETEWITH MICROSCOPIC ([facility] ONLY) - Abnormal; Notable for the following:    Color, Urine STRAW (*)    APPearance CLEAR (*)    Glucose, UA >500 (*)    Squamous Epithelial / LPF 0-5 (*)    All other components within normal limits  GLUCOSE, CAPILLARY - Abnormal; Notable for the following:    Glucose-Capillary 277 (*)    All other components within normal limits  CBG MONITORING, ED   ____________________________________________  EKG  None ____________________________________________  RADIOLOGY  None ____________________________________________    PROCEDURES  Procedure(s) performed: None   Procedures   Medications  acetaminophen (TYLENOL) tablet 650 mg (650 mg Oral Given 10/29/15 1038)  ketorolac (TORADOL) 30 MG/ML injection 30 mg (30 mg  Intramuscular Given 10/29/15 1038)  diphenhydrAMINE (BENADRYL) injection 25 mg (25 mg Intravenous Given 10/29/15 1208)  sodium chloride 0.9 % bolus 1,000 mL (0 mLs Intravenous Stopped 10/29/15 1336)  famotidine (PEPCID) IVPB 20 mg premix (0 mg Intravenous Stopped 10/29/15 1336)     ____________________________________________   INITIAL IMPRESSION / ASSESSMENT AND PLAN / ED COURSE  Pertinent labs & imaging results that were available during my care of the patient were reviewed by me and considered in my medical decision making (see chart for details).  Clinical Course  Comment By Time  Patient notes that his headache is been completely alleviated by Tylenol and Toradol. States he continues to have left lower back and buttock pain but it also has improved slightly. Notes that he continues to feel like his eyelids are swollen and he is fatigued. Does have a history of diabetes in which she states he has not taken his insulin last night or this morning due to his blood sugars last night being 110. States his blood sugars normally run in the 300s but has not checked his sugar today. Continues to deny any abdominal pain, nausea, vomiting. Hope Pigeon, PA-C 09/20 1112  Spoke to patient in regards to lab results which shows no sign of infection with a slightly elevated glucose of 258. Patient states this is close to his norm. He does state he feels somewhat better since being given fluid, Pepcid and Benadryl. His eyes are less edematous. Discussed that he would be ready for discharge once IV fluids had been completed. At this time he has 500 mL remaining in the back. Patient will be discharged home with treatment of muscle strain and allergic reaction. Hope Pigeon, PA-C 09/20 1247    Patient's diagnosis is consistent with Muscle strain and allergic reaction. Patient will be discharged home with prescriptions for Zyrtec, Benadryl and Naprosyn to take as directed. Patient is advised to take his insulin as  prescribed by his primary care provider and continue to monitor his sugar levels. Patient is to follow up with [facility] if symptoms persist past this treatment course. Patient is given ED precautions to return to the ED for any worsening or new symptoms.      ____________________________________________  FINAL CLINICAL IMPRESSION(S) / ED DIAGNOSES  Final diagnoses:  Muscle strain  Allergic reaction, initial encounter      NEW MEDICATIONS STARTED DURING THIS VISIT:  Discharge Medication List as of 10/29/2015  1:24 PM    START taking these medications   Details  cetirizine (ZYRTEC) 10 MG  tablet Take 1 tablet (10 mg total) by mouth daily., Starting Wed 10/29/2015, Until Fri 11/28/2015, Print    diphenhydrAMINE (BENADRYL) 25 mg capsule Take 2 capsules (50 mg total) by mouth every 6 (six) hours as needed for itching., Starting Wed 10/29/2015, Until Wed 11/05/2015, Print    naproxen (NAPROSYN) 500 MG tablet Take 1 tablet (500 mg total) by mouth 2 (two) times daily with a meal., Starting Wed 10/29/2015, Print              Ernestene Kiel [place], PA-C 10/29/15 1442    Jennye Moccasin, MD 10/29/15 1500

## 2016-01-30 ENCOUNTER — Inpatient Hospital Stay
Admission: EM | Admit: 2016-01-30 | Discharge: 2016-02-01 | DRG: 066 | Disposition: A | Discharge status: Home / Self Care | Payer: Self-pay | Attending: Internal Medicine | Admitting: Internal Medicine

## 2016-01-30 ENCOUNTER — Emergency Department: Payer: Self-pay

## 2016-01-30 ENCOUNTER — Inpatient Hospital Stay: Payer: Self-pay

## 2016-01-30 DIAGNOSIS — Z79899 Other long term (current) drug therapy: Secondary | ICD-10-CM

## 2016-01-30 DIAGNOSIS — I639 Cerebral infarction, unspecified: Secondary | ICD-10-CM

## 2016-01-30 DIAGNOSIS — R001 Bradycardia, unspecified: Secondary | ICD-10-CM | POA: Diagnosis present

## 2016-01-30 DIAGNOSIS — R51 Headache: Secondary | ICD-10-CM

## 2016-01-30 DIAGNOSIS — R262 Difficulty in walking, not elsewhere classified: Secondary | ICD-10-CM

## 2016-01-30 DIAGNOSIS — Z794 Long term (current) use of insulin: Secondary | ICD-10-CM

## 2016-01-30 DIAGNOSIS — I63511 Cerebral infarction due to unspecified occlusion or stenosis of right middle cerebral artery: Principal | ICD-10-CM | POA: Diagnosis present

## 2016-01-30 DIAGNOSIS — Z833 Family history of diabetes mellitus: Secondary | ICD-10-CM

## 2016-01-30 DIAGNOSIS — E1165 Type 2 diabetes mellitus with hyperglycemia: Secondary | ICD-10-CM | POA: Diagnosis present

## 2016-01-30 DIAGNOSIS — F17210 Nicotine dependence, cigarettes, uncomplicated: Secondary | ICD-10-CM | POA: Diagnosis present

## 2016-01-30 DIAGNOSIS — Z8249 Family history of ischemic heart disease and other diseases of the circulatory system: Secondary | ICD-10-CM

## 2016-01-30 DIAGNOSIS — R519 Headache, unspecified: Secondary | ICD-10-CM

## 2016-01-30 DIAGNOSIS — R29898 Other symptoms and signs involving the musculoskeletal system: Secondary | ICD-10-CM

## 2016-01-30 DIAGNOSIS — I10 Essential (primary) hypertension: Secondary | ICD-10-CM | POA: Diagnosis present

## 2016-01-30 DIAGNOSIS — F17200 Nicotine dependence, unspecified, uncomplicated: Secondary | ICD-10-CM

## 2016-01-30 LAB — DIFFERENTIAL
BASOS ABS: 0 10*3/uL (ref 0–0.1)
BASOS PCT: 1 %
Eosinophils Absolute: 0.1 10*3/uL (ref 0–0.7)
Eosinophils Relative: 2 %
LYMPHS PCT: 36 %
Lymphs Abs: 1.8 10*3/uL (ref 1.0–3.6)
Monocytes Absolute: 0.5 10*3/uL (ref 0.2–1.0)
Monocytes Relative: 10 %
NEUTROS ABS: 2.6 10*3/uL (ref 1.4–6.5)
NEUTROS PCT: 51 %

## 2016-01-30 LAB — CBC
HCT: 46.5 % (ref 40.0–52.0)
Hemoglobin: 15.7 g/dL (ref 13.0–18.0)
MCH: 31.8 pg (ref 26.0–34.0)
MCHC: 33.8 g/dL (ref 32.0–36.0)
MCV: 93.9 fL (ref 80.0–100.0)
PLATELETS: 217 10*3/uL (ref 150–440)
RBC: 4.95 MIL/uL (ref 4.40–5.90)
RDW: 13.5 % (ref 11.5–14.5)
WBC: 5 10*3/uL (ref 3.8–10.6)

## 2016-01-30 LAB — COMPREHENSIVE METABOLIC PANEL
ALBUMIN: 4.1 g/dL (ref 3.5–5.0)
ALK PHOS: 89 U/L (ref 38–126)
ALT: 19 U/L (ref 17–63)
AST: 25 U/L (ref 15–41)
Anion gap: 9 (ref 5–15)
BUN: 16 mg/dL (ref 6–20)
CALCIUM: 9.4 mg/dL (ref 8.9–10.3)
CHLORIDE: 102 mmol/L (ref 101–111)
CO2: 24 mmol/L (ref 22–32)
CREATININE: 0.94 mg/dL (ref 0.61–1.24)
GFR calc Af Amer: >60 mL/min (ref >60)
GFR calc non Af Amer: >60 mL/min (ref >60)
GLUCOSE: 347 mg/dL — AB (ref 65–99)
Potassium: 3.5 mmol/L (ref 3.5–5.1)
SODIUM: 135 mmol/L (ref 135–145)
Total Bilirubin: 0.5 mg/dL (ref 0.3–1.2)
Total Protein: 7.3 g/dL (ref 6.5–8.1)

## 2016-01-30 LAB — TROPONIN I: Troponin I: <0.03 ng/mL (ref <0.03)

## 2016-01-30 LAB — GLUCOSE, CAPILLARY
GLUCOSE-CAPILLARY: 331 mg/dL — AB (ref 65–99)
GLUCOSE-CAPILLARY: 261 mg/dL — AB (ref 65–99)
Glucose-Capillary: 445 mg/dL — ABNORMAL HIGH (ref 65–99)

## 2016-01-30 LAB — PROTIME-INR
INR: 1.02
PROTHROMBIN TIME: 13.4 s (ref 11.4–15.2)

## 2016-01-30 LAB — APTT: APTT: 26 s (ref 24–36)

## 2016-01-30 MED ORDER — NICOTINE 14 MG/24HR TD PT24
14.0000 mg | MEDICATED_PATCH | Freq: Every day | TRANSDERMAL | Status: DC
  Administered 2016-01-30 – 2016-02-01 (×3): 14 mg via TRANSDERMAL
  Filled 2016-01-30 (×3): qty 1

## 2016-01-30 MED ORDER — ASPIRIN 81 MG PO CHEW
324.0000 mg | CHEWABLE_TABLET | Freq: Once | ORAL | Status: AC
  Administered 2016-01-30: 324 mg via ORAL

## 2016-01-30 MED ORDER — ASPIRIN 81 MG PO CHEW
CHEWABLE_TABLET | ORAL | Status: AC
  Administered 2016-01-30: 324 mg via ORAL
  Filled 2016-01-30: qty 4

## 2016-01-30 MED ORDER — INSULIN ASPART 100 UNIT/ML ~~LOC~~ SOLN
0.0000 [IU] | Freq: Three times a day (TID) | SUBCUTANEOUS | Status: DC
  Administered 2016-01-31: 5 [IU] via SUBCUTANEOUS
  Administered 2016-01-31: 12:00:00 9 [IU] via SUBCUTANEOUS
  Administered 2016-01-31 – 2016-02-01 (×2): 3 [IU] via SUBCUTANEOUS
  Filled 2016-01-30: qty 3
  Filled 2016-01-30: qty 5
  Filled 2016-01-30: qty 9
  Filled 2016-01-30: qty 3

## 2016-01-30 MED ORDER — GADOBENATE DIMEGLUMINE 529 MG/ML IV SOLN: 20.0000 mL | Freq: Once | INTRAVENOUS | Status: DC | PRN

## 2016-01-30 MED ORDER — ENOXAPARIN SODIUM 40 MG/0.4ML ~~LOC~~ SOLN
40.0000 mg | Freq: Two times a day (BID) | SUBCUTANEOUS | Status: DC
  Administered 2016-01-30 – 2016-01-31 (×3): 40 mg via SUBCUTANEOUS
  Filled 2016-01-30 (×3): qty 0.4

## 2016-01-30 MED ORDER — INSULIN ASPART 100 UNIT/ML ~~LOC~~ SOLN
15.0000 [IU] | Freq: Once | SUBCUTANEOUS | Status: AC
  Administered 2016-01-30: 15 [IU] via SUBCUTANEOUS
  Filled 2016-01-30: qty 15

## 2016-01-30 MED ORDER — ACETAMINOPHEN 325 MG PO TABS
650.0000 mg | ORAL_TABLET | ORAL | Status: DC | PRN
  Administered 2016-01-31: 650 mg via ORAL
  Filled 2016-01-30: qty 2

## 2016-01-30 MED ORDER — ACETAMINOPHEN 160 MG/5ML PO SOLN
650.0000 mg | ORAL | Status: DC | PRN
  Filled 2016-01-30: qty 20.3

## 2016-01-30 MED ORDER — ROSUVASTATIN CALCIUM 20 MG PO TABS
40.0000 mg | ORAL_TABLET | Freq: Every day | ORAL | Status: DC
  Administered 2016-01-30 – 2016-02-01 (×3): 40 mg via ORAL
  Filled 2016-01-30: qty 2
  Filled 2016-01-30: qty 1
  Filled 2016-01-30 (×2): qty 2

## 2016-01-30 MED ORDER — ACETAMINOPHEN 650 MG RE SUPP: 650.0000 mg | RECTAL | Status: DC | PRN

## 2016-01-30 MED ORDER — STROKE: EARLY STAGES OF RECOVERY BOOK
Freq: Once | Status: AC
  Administered 2016-01-30: 18:00:00

## 2016-01-30 MED ORDER — INSULIN ASPART 100 UNIT/ML ~~LOC~~ SOLN
0.0000 [IU] | Freq: Every day | SUBCUTANEOUS | Status: DC
  Administered 2016-01-30: 22:00:00 3 [IU] via SUBCUTANEOUS
  Administered 2016-01-31: 2 [IU] via SUBCUTANEOUS
  Filled 2016-01-30: qty 3
  Filled 2016-01-30: qty 2

## 2016-01-30 MED ORDER — INSULIN DETEMIR 100 UNIT/ML ~~LOC~~ SOLN
5.0000 [IU] | Freq: Every day | SUBCUTANEOUS | Status: DC
  Administered 2016-01-30 – 2016-01-31 (×2): 5 [IU] via SUBCUTANEOUS
  Filled 2016-01-30 (×4): qty 0.05

## 2016-01-30 MED ORDER — ASPIRIN EC 81 MG PO TBEC
81.0000 mg | DELAYED_RELEASE_TABLET | Freq: Every day | ORAL | Status: DC
  Administered 2016-01-30 – 2016-02-01 (×3): 81 mg via ORAL
  Filled 2016-01-30 (×3): qty 1

## 2016-01-30 NOTE — ED Notes (Signed)
Pt requesting to go outside and smoke a cigarette. This RN states to pt that this is a non smoking facility and that it would be against his best interest to smoke.

## 2016-01-30 NOTE — H&P (Addendum)
[facility] [place] at [facility]   PATIENT NAME: Henry Munoz    MR#:  [id number]  DATE OF BIRTH:  1965/12/08  DATE OF ADMISSION:  01/30/2016  PRIMARY CARE PHYSICIAN: No PCP Per Patient   REQUESTING/REFERRING PHYSICIAN: Dr. Scotty CourtStafford  CHIEF COMPLAINT:   Difficulty walking HISTORY OF PRESENT ILLNESS:  Henry Munoz  is a 50 y.o. male with a known history of Diabetes and tobacco dependence who presents with above complaint. Patient reports that His symptoms of left-sided weakness, balance instability and speech issue started yesterday. He presents today because his gait was not any better. CT shows acute stroke. Neurology was consulted and contacted by myself. Neurologist looked at the CT scan and believes there is no hemorrhage.  PAST MEDICAL HISTORY:   Past Medical History:  Diagnosis Date  . Diabetes mellitus without complication (HCC)   . Hypertension     PAST SURGICAL HISTORY:  none  SOCIAL HISTORY:   Social History  Substance Use Topics  . Smoking status: Current Every Day Smoker    Packs/day: 0.50    Types: Cigarettes  . Smokeless tobacco: Never Used  . Alcohol use No     Comment: per week    FAMILY HISTORY:  No family history on file.  DRUG ALLERGIES:  No Known Allergies  REVIEW OF SYSTEMS:   Review of Systems  Constitutional: Negative.  Negative for chills, fever and malaise/fatigue.  HENT: Negative.  Negative for ear discharge, ear pain, hearing loss, nosebleeds and sore throat.   Eyes: Negative.  Negative for blurred vision and pain.  Respiratory: Negative.  Negative for cough, hemoptysis, shortness of breath and wheezing.   Cardiovascular: Negative.  Negative for chest pain, palpitations and leg swelling.  Gastrointestinal: Negative.  Negative for abdominal pain, blood in stool, diarrhea, nausea and vomiting.  Genitourinary: Negative.  Negative for dysuria.  Musculoskeletal: Negative.  Negative for back pain.  Skin: Negative.    Neurological: Positive for sensory change, speech change and focal weakness. Negative for dizziness, tremors, seizures and headaches.  Endo/Heme/Allergies: Negative.  Does not bruise/bleed easily.  Psychiatric/Behavioral: Negative.  Negative for depression, hallucinations and suicidal ideas.    MEDICATIONS AT HOME:   Prior to Admission medications   Medication Sig Start Date End Date Taking? Authorizing Provider  cetirizine (ZYRTEC) 10 MG tablet Take 1 tablet (10 mg total) by mouth daily. Patient not taking: Reported on 01/30/2016 10/29/15 11/28/15  Jami L Hagler, PA-C  clotrimazole (LOTRIMIN) 1 % cream Apply 1 application topically 2 (two) times daily. Patient not taking: Reported on 01/30/2016 12/08/14   Sharyn CreamerMark Quale, MD  diphenhydrAMINE (BENADRYL) 25 mg capsule Take 2 capsules (50 mg total) by mouth every 6 (six) hours as needed for itching. Patient not taking: Reported on 01/30/2016 10/29/15 11/05/15  Jami L Hagler, PA-C  insulin detemir (LEVEMIR) 100 UNIT/ML injection Inject 0.05 mLs (5 Units total) into the skin at bedtime. Patient not taking: Reported on 01/30/2016 10/13/14   Sharyn CreamerMark Quale, MD  naproxen (NAPROSYN) 500 MG tablet Take 1 tablet (500 mg total) by mouth 2 (two) times daily with a meal. 10/29/15   Jami L Hagler, PA-C  oxyCODONE-acetaminophen (ROXICET) 5-325 MG per tablet Take 1 tablet by mouth every 6 (six) hours as needed for severe pain. Patient not taking: Reported on 01/30/2016 11/01/14   Sharman CheekPhillip Stafford, MD      VITAL SIGNS:  Blood pressure 116/61, pulse 78, temperature 98 F (36.7 C), temperature source Oral, resp. rate (!) 22, height 5\' 7"  (  1.702 m), weight 121.1 kg (267 lb), SpO2 96 %.  PHYSICAL EXAMINATION:   Physical Exam  Constitutional: He is oriented to person, place, and time and well-developed, well-nourished, and in no distress. No distress.  HENT:  Head: Normocephalic.  Eyes: No scleral icterus.  Neck: Normal range of motion. Neck supple. No JVD present. No  tracheal deviation present.  Cardiovascular: Normal rate, regular rhythm and normal heart sounds.  Exam reveals no gallop and no friction rub.   No murmur heard. Pulmonary/Chest: Effort normal and breath sounds normal. No respiratory distress. He has no wheezes. He has no rales. He exhibits no tenderness.  Abdominal: Soft. Bowel sounds are normal. He exhibits no distension and no mass. There is no tenderness. There is no rebound and no guarding.  Musculoskeletal: Normal range of motion. He exhibits no edema.  Neurological: He is alert and oriented to person, place, and time.  Left upper and lower extremity weakness with slight left facial droop and slurred speech  Skin: Skin is warm. No rash noted. No erythema.  Psychiatric: Affect and judgment normal.      LABORATORY PANEL:   CBC  Recent Labs Lab 01/30/16 1207  WBC 5.0  HGB 15.7  HCT 46.5  PLT 217   ------------------------------------------------------------------------------------------------------------------  Chemistries   Recent Labs Lab 01/30/16 1207  NA 135  K 3.5  CL 102  CO2 24  GLUCOSE 347*  BUN 16  CREATININE 0.94  CALCIUM 9.4  AST 25  ALT 19  ALKPHOS 89  BILITOT 0.5   ------------------------------------------------------------------------------------------------------------------  Cardiac Enzymes  Recent Labs Lab 01/30/16 1207  TROPONINI <0.03   ------------------------------------------------------------------------------------------------------------------  RADIOLOGY:  Ct Head Wo Contrast  Result Date: 01/30/2016 CLINICAL DATA:  Sudden onset of difficulty walking and imbalance since yesterday. Headache. Left arm drooping. EXAM: CT HEAD WITHOUT CONTRAST TECHNIQUE: Contiguous axial images were obtained from the base of the skull through the vertex without intravenous contrast. COMPARISON:  None. FINDINGS: Brain: There is a large area of hypoattenuation in the right supratentorial brain,  involving posterior aspect of right frontal, right parietal and right occipital lobe. The ventricular system is normal. Diffuse bilateral changes of deep white matter mild microangiopathy. No evidence of hemorrhagic transformation. No evidence of midline shift. Vascular: No hyperdense vessels. Calcific atherosclerotic disease of bilateral vertebral arteries noted. Skull: Normal. Negative for fracture or focal lesion. Sinuses/Orbits: No acute finding. Other: None. IMPRESSION: Large area of abnormal hypoattenuation involving right supratentorial brain, mainly posterior aspect of right frontal lobe, right parietal lobe and right occipital lobe. This may represent an age-indeterminate ischemic infarction, subacute hemorrhage or vasogenic edema. These results were called by telephone at the time of interpretation on 01/30/2016 at 12:44 pm to Dr. Roxan Hockeyobinson, who verbally acknowledged these results. Electronically Signed   By: Ted Mcalpineobrinka  Dimitrova M.D.   On: 01/30/2016 12:47   Dg Chest Portable 1 View  Result Date: 01/30/2016 CLINICAL DATA:  50 year old diabetic hypertensive male with left upper extremity weakness. Initial encounter. EXAM: PORTABLE CHEST 1 VIEW COMPARISON:  None. FINDINGS: Two views obtained with different degrees of inspiration. Heart size top-normal. Central pulmonary vascular prominence without pulmonary edema. Minimal right perihilar peribronchial thickening may represent chronic changes. No segmental consolidation or pneumothorax. No plain film evidence of pulmonary malignancy. No acute osseous abnormality. IMPRESSION: No acute pulmonary abnormality. Electronically Signed   By: Lacy DuverneySteven  Olson M.D.   On: 01/30/2016 13:48    EKG:   Normal sinus rhythm left axis deviation heart rate 81 no ST elevation  IMPRESSION  AND PLAN:   50 year old male with history of tobacco dependence and diabetes who presents with weakness and found to have Large area of abnormal hypoattenuation involving right  supratentorial brain, mainly posterior aspect of right frontal lobe, right parietal lobe and right occipital lobe.  1. Acute/subacute right MCA territory CVA: Discussed case with neurologist who did not believe patient to have an intracranial bleed Recommendations were to obtain MRI/MRA of head and neck and Echocardiogram Start aspirin and high intensity statin Continue neuro checks as per protocol PT/OT/speech consult  2. Uncontrolled type II Diabetes: Check hemoglobin A1c Continue Levemir for now but will likely need this to be increased Start sliding scale insulin and ADA diet  3. Tobacco dependence: Patient is highly encouraged to stop smoking. Patient counseled for 3 minutes.  4. History of hypertension: Patient currently not taking any medications he said he had a side effect with shortness of breath using ACE inhibitor. Due to problem #1 we will continue to monitor blood pressure without blood pressure medications and to allow brain perfusion At discharge patient will need perhaps low-dose beta blocker or HCTZ with close follow-up and PCP  All the records are reviewed and case discussed with ED provider. Management plans discussed with the patient and he is in agreement  CODE STATUS: FULL  TOTAL TIME TAKING CARE OF THIS PATIENT: 50 minutes.    Faisal Stradling M.D on 01/30/2016 at 2:04 PM  Between 7am to 6pm - Pager - [phone]  After 6pm go to [email or web address] - Social research officer, government  [facility]  Office  [phone]  CC: Primary care physician; No PCP Per Patient

## 2016-01-30 NOTE — ED Triage Notes (Addendum)
Pt arrives with family member who report that pt had sudden onset of difficulty walking and balance issues yesterday around lunchtime when patient came back home from being out with friends. Pt c/o headache last night. Pt also having hard time straightening left arm that started when balance issues arose.. Pt speech clear and pt is oriented. Face symmetrical. Left arm droop and weakness is present at this time with some effort against gravity. Leg strength equal.

## 2016-01-30 NOTE — Progress Notes (Signed)
Anticoagulation monitoring(Lovenox):  50 yo male ordered Lovenox 40 mg Q24h  Filed Weights   01/30/16 1204  Weight: 267 lb (121.1 kg)   BMI 41.9    Lab Results  Component Value Date   CREATININE 0.94 01/30/2016   CREATININE 0.81 10/29/2015   CREATININE 1.00 12/08/2014   Estimated Creatinine Clearance: 117.2 mL/min (by C-G formula based on SCr of 0.94 mg/dL). Hemoglobin & Hematocrit     Component Value Date/Time   HGB 15.7 01/30/2016 1207   HCT 46.5 01/30/2016 1207     Per Protocol for Patient with estCrcl > 30 ml/min and BMI > 40, will transition to Lovenox 40 mg Q12h.

## 2016-01-30 NOTE — ED Notes (Signed)
Patient transported to MRI 

## 2016-01-30 NOTE — Progress Notes (Signed)
On call MD, Dr. Allena KatzPatel notified patient's blood sugar 445 this evening. Verbal order read back and verified for 15 units Novolog once now.

## 2016-01-30 NOTE — ED Notes (Signed)
Pt returns to ed room 1 at this time. Pt will be transported to 116.

## 2016-01-30 NOTE — ED Notes (Signed)
Patient transported to CT 

## 2016-01-30 NOTE — ED Notes (Signed)
Patient denies pain and is resting comfortably.  

## 2016-01-30 NOTE — ED Notes (Signed)
Pt currently in MRI with echo ordered after. [facility] will be going to room 116 after echo.

## 2016-01-30 NOTE — ED Provider Notes (Signed)
[facility] Emergency Department Provider Note  ____________________________________________  Time seen: Approximately 1:11 PM  I have reviewed the triage vital signs and the nursing notes.   HISTORY  Chief Complaint Difficulty Walking and Extremity Weakness    HPI Henry Munoz is a 50 y.o. male who complains of headache and left upper extremity weakness and feeling off balance for the past 24 hours. This all started just prior to lunch time yesterday.,. Smokes half pack a day. Rare alcohol use. No drug use.  As a history of hypertension diabetes. Does not take any medications reliably. No history of stroke. No thunderclap headache or vision changes. He does complain of a right parietal headache which is gradual in onset.     Past Medical History:  Diagnosis Date  . Diabetes mellitus without complication (HCC)   . Hypertension      There are no active problems to display for this patient.    History reviewed. No pertinent surgical history.   Prior to Admission medications   Medication Sig Start Date End Date Taking? Authorizing Provider  cetirizine (ZYRTEC) 10 MG tablet Take 1 tablet (10 mg total) by mouth daily. 10/29/15 11/28/15  Jami L Hagler, PA-C  clotrimazole (LOTRIMIN) 1 % cream Apply 1 application topically 2 (two) times daily. 12/08/14   Sharyn CreamerMark Quale, MD  diphenhydrAMINE (BENADRYL) 25 mg capsule Take 2 capsules (50 mg total) by mouth every 6 (six) hours as needed for itching. 10/29/15 11/05/15  Jami L Hagler, PA-C  doxycycline (VIBRAMYCIN) 100 MG capsule Take 1 capsule (100 mg total) by mouth 2 (two) times daily. 11/01/14   Sharman CheekPhillip Durante Violett, MD  insulin detemir (LEVEMIR) 100 UNIT/ML injection Inject 0.05 mLs (5 Units total) into the skin at bedtime. 10/13/14   Sharyn CreamerMark Quale, MD  naproxen (NAPROSYN) 500 MG tablet Take 1 tablet (500 mg total) by mouth 2 (two) times daily with a meal. 10/29/15   Jami L Hagler, PA-C  oxyCODONE-acetaminophen (ROXICET)  5-325 MG per tablet Take 1 tablet by mouth every 6 (six) hours as needed for severe pain. 11/01/14   Sharman CheekPhillip Clerance Umland, MD     Allergies Patient has no known allergies.   No family history on file.  Social History Social History  Substance Use Topics  . Smoking status: Current Every Day Smoker    Packs/day: 0.50    Types: Cigarettes  . Smokeless tobacco: Never Used  . Alcohol use No     Comment: per week    Review of Systems  Constitutional:   No fever or chills.  ENT:   No sore throat. No rhinorrhea. Cardiovascular:   No chest pain. Respiratory:   No dyspnea or cough. Gastrointestinal:   Negative for abdominal pain, vomiting and diarrhea.  Genitourinary:   Negative for dysuria or difficulty urinating. Musculoskeletal:   Negative for focal pain or swelling Neurological:   Positive right-sided headache as above, positive left arm weakness 10-point ROS otherwise negative.  ____________________________________________   PHYSICAL EXAM:  VITAL SIGNS: ED Triage Vitals [01/30/16 1204]  Enc Vitals Group     BP (!) 148/96     Pulse Rate 86     Resp 18     Temp 98 F (36.7 C)     Temp Source Oral     SpO2 96 %     Weight 267 lb (121.1 kg)     Height 5\' 7"  (1.702 m)     Head Circumference      Peak Flow  Pain Score      Pain Loc      Pain Edu?      Excl. in GC?     Vital signs reviewed, nursing assessments reviewed.   Constitutional:   Alert and oriented. Well appearing and in no distress. Eyes:   No scleral icterus. No conjunctival pallor. PERRL. EOMI.  No nystagmus. ENT   Head:   Normocephalic and atraumatic.   Nose:   No congestion/rhinnorhea. No septal hematoma   Mouth/Throat:   MMM, no pharyngeal erythema. No peritonsillar mass.    Neck:   No stridor. No SubQ emphysema. No meningismus. Hematological/Lymphatic/Immunilogical:   No cervical lymphadenopathy. Cardiovascular:   RRR. Symmetric bilateral radial and DP pulses.  No murmurs.   Respiratory:   Normal respiratory effort without tachypnea nor retractions. Breath sounds are clear and equal bilaterally. No wheezes/rales/rhonchi. Gastrointestinal:   Soft and nontender. Non distended. There is no CVA tenderness.  No rebound, rigidity, or guarding. Genitourinary:   deferred Musculoskeletal:   Nontender with normal range of motion in all extremities. No joint effusions.  No lower extremity tenderness.  No edema. Neurologic:   Normal speech and language.  CN 2-10 normal. Motor strength slightly diminished in the large muscle groups of the left arm compared to the right.. Weaker grip strength. There is positive drift in the left upper extremity. There is limb ataxia in the left upper extremity with finger to nose testing. Lower extremities symmetric.  NIH stroke scale equals 2 Skin:    Skin is warm, dry and intact. No rash noted.  No petechiae, purpura, or bullae.  ____________________________________________    LABS (pertinent positives/negatives) (all labs ordered are listed, but only abnormal results are displayed) Labs Reviewed  COMPREHENSIVE METABOLIC PANEL - Abnormal; Notable for the following:       Result Value   Glucose, Bld 347 (*)    All other components within normal limits  GLUCOSE, CAPILLARY - Abnormal; Notable for the following:    Glucose-Capillary 331 (*)    All other components within normal limits  PROTIME-INR  APTT  CBC  DIFFERENTIAL  TROPONIN I  CBG MONITORING, ED   ____________________________________________   EKG  Interpreted by me Normal sinus rhythm rate of 81. Left axis, normal intervals. Voltage criteria for LVH in the high lateral leads with associated repolarization abnormality T-wave inversion. Poor R-wave progression in anterior precordial leads. No acute ischemic changes.  ____________________________________________    RADIOLOGY  CT head consistent with right MCA  infarct.  ____________________________________________   PROCEDURES Procedures  ____________________________________________   INITIAL IMPRESSION / ASSESSMENT AND PLAN / ED COURSE  Pertinent labs & imaging results that were available during my care of the patient were reviewed by me and considered in my medical decision making (see chart for details).  Patient presents with stroke symptoms greater than 24 hours. CT suggestive of a diffuse right MCA infarct, however, exam findings are actually fairly mild compared to the appearance of the imaging. Case discussed with neurology Dr. Loretha BrasilZeylikman by phone who will review images. Recommends MRI MRA head and neck. Will evaluate in the hospital. Plan to admit. Oral aspirin for now. No cranial nerve involvement noted. Case discussed with hospitalist.   Clinical Course    ____________________________________________   FINAL CLINICAL IMPRESSION(S) / ED DIAGNOSES  Final diagnoses:  Cerebrovascular accident (CVA), unspecified mechanism (HCC)  Essential hypertension  Type 2 diabetes mellitus with hyperglycemia, without long-term current use of insulin (HCC)  Smoking      New Prescriptions  No medications on file     Portions of this note were generated with dragon dictation software. Dictation errors may occur despite best attempts at proofreading.    Sharman Cheek, MD 01/30/16 1341

## 2016-01-31 ENCOUNTER — Inpatient Hospital Stay
Admit: 2016-01-31 | Discharge: 2016-01-31 | Disposition: A | Discharge status: Home / Self Care | Payer: Self-pay | Attending: Internal Medicine | Admitting: Internal Medicine

## 2016-01-31 DIAGNOSIS — I63311 Cerebral infarction due to thrombosis of right middle cerebral artery: Secondary | ICD-10-CM

## 2016-01-31 LAB — GLUCOSE, CAPILLARY
GLUCOSE-CAPILLARY: 212 mg/dL — AB (ref 65–99)
GLUCOSE-CAPILLARY: 255 mg/dL — AB (ref 65–99)
GLUCOSE-CAPILLARY: 232 mg/dL — AB (ref 65–99)
Glucose-Capillary: 356 mg/dL — ABNORMAL HIGH (ref 65–99)
Glucose-Capillary: 248 mg/dL — ABNORMAL HIGH (ref 65–99)

## 2016-01-31 LAB — LIPID PANEL
CHOLESTEROL: 227 mg/dL — AB (ref 0–200)
HDL: 30 mg/dL — ABNORMAL LOW (ref >40)
LDL CALC: 156 mg/dL — AB (ref 0–99)
Total CHOL/HDL Ratio: 7.6 RATIO
Triglycerides: 205 mg/dL — ABNORMAL HIGH (ref <150)
VLDL: 41 mg/dL — AB (ref 0–40)

## 2016-01-31 MED ORDER — ASPIRIN 81 MG PO TBEC: 81.0000 mg | DELAYED_RELEASE_TABLET | Freq: Every day | ORAL | 0 refills | Status: DC

## 2016-01-31 MED ORDER — AMLODIPINE BESYLATE 10 MG PO TABS: 10.0000 mg | ORAL_TABLET | Freq: Every day | ORAL | 0 refills | Status: DC

## 2016-01-31 MED ORDER — AMLODIPINE BESYLATE 10 MG PO TABS
10.0000 mg | ORAL_TABLET | Freq: Every day | ORAL | Status: DC
  Administered 2016-01-31 – 2016-02-01 (×2): 10 mg via ORAL
  Filled 2016-01-31 (×2): qty 1

## 2016-01-31 MED ORDER — HYDRALAZINE HCL 20 MG/ML IJ SOLN
10.0000 mg | Freq: Once | INTRAMUSCULAR | Status: AC
  Administered 2016-01-31: 10 mg via INTRAVENOUS
  Filled 2016-01-31: qty 1

## 2016-01-31 MED ORDER — NICOTINE 14 MG/24HR TD PT24: 14.0000 mg | MEDICATED_PATCH | Freq: Every day | TRANSDERMAL | 0 refills | Status: DC

## 2016-01-31 MED ORDER — HYDROCHLOROTHIAZIDE 50 MG PO TABS
50.0000 mg | ORAL_TABLET | Freq: Every day | ORAL | Status: DC
  Administered 2016-01-31 – 2016-02-01 (×2): 50 mg via ORAL
  Filled 2016-01-31 (×3): qty 1

## 2016-01-31 MED ORDER — INSULIN DETEMIR 100 UNIT/ML ~~LOC~~ SOLN: 8.0000 [IU] | Freq: Every day | SUBCUTANEOUS | 0 refills | Status: DC

## 2016-01-31 MED ORDER — ATORVASTATIN CALCIUM 40 MG PO TABS: 40.0000 mg | ORAL_TABLET | Freq: Every day | ORAL | 0 refills | Status: DC

## 2016-01-31 MED ORDER — CLOPIDOGREL BISULFATE 75 MG PO TABS
75.0000 mg | ORAL_TABLET | Freq: Every day | ORAL | Status: DC
  Administered 2016-01-31 – 2016-02-01 (×2): 75 mg via ORAL
  Filled 2016-01-31 (×2): qty 1

## 2016-01-31 NOTE — Care Management Note (Signed)
Case Management Note  Patient Details  Name: Henry Munoz MRN: [id number] Date of Birth: 1965/03/06  Subjective/Objective:    Uninsured [name] is being discharged home with HH=PT, RN, OT, Aide, Speech, and a rolling walker. A charity application will be faxed to [facility] as soon as [name] can provide household income for the past 12 months to this Case Manager. Also, a request for a [name] has been [facility] to [facility] as soon as [name] provides the requested financial information.                 Action/Plan:   Expected Discharge Date:                  Expected Discharge Plan:     In-House Referral:     Discharge planning Services     Post Acute Care Choice:    Choice offered to:     DME Arranged:    DME Agency:     HH Arranged:    HH Agency:     Status of Service:     If discussed at [employer] Length of Stay Meetings, dates discussed:    Additional Comments:  Larysa Pall A, RN 01/31/2016, 9:13 AM

## 2016-01-31 NOTE — Progress Notes (Signed)
OT Cancellation Note  Patient Details Name: Henry Munoz MRN: [id number] DOB: June 07, 1965   Cancelled Treatment:     Attempted to see patient this am and he is off the floor for testing. Continue to reattempt to see patient for evaluation.   Maite Burlison T Kalaya Infantino, OTR/L, CLT   See [place] 01/31/2016, 10:49 AM

## 2016-01-31 NOTE — Progress Notes (Signed)
[employer] - [place] [facility]   PATIENT NAME: Henry Munoz    MR#:  [id number]  DATE OF BIRTH:  10-Nov-1965  SUBJECTIVE:   Patient still with left sided weakness but improved  REVIEW OF SYSTEMS:    Review of Systems  Constitutional: Negative.  Negative for chills, fever and malaise/fatigue.  HENT: Negative.  Negative for ear discharge, ear pain, hearing loss, nosebleeds and sore throat.   Eyes: Negative.  Negative for blurred vision and pain.  Respiratory: Negative.  Negative for cough, hemoptysis, shortness of breath and wheezing.   Cardiovascular: Negative.  Negative for chest pain, palpitations and leg swelling.  Gastrointestinal: Negative.  Negative for abdominal pain, blood in stool, diarrhea, nausea and vomiting.  Genitourinary: Negative.  Negative for dysuria.  Musculoskeletal: Negative.  Negative for back pain.  Skin: Negative.   Neurological: Positive for sensory change, speech change and focal weakness. Negative for dizziness, tremors, seizures and headaches.  Endo/Heme/Allergies: Negative.  Does not bruise/bleed easily.  Psychiatric/Behavioral: Negative.  Negative for depression, hallucinations and suicidal ideas.    Tolerating Diet:yes     DRUG ALLERGIES:  No Known Allergies  VITALS:  Blood pressure (!) 151/121, pulse 80, temperature 98.4 F (36.9 C), temperature source Oral, resp. rate 19, height 5\' 7"  (1.702 m), weight 121.1 kg (267 lb), SpO2 97 %.  PHYSICAL EXAMINATION:   Physical Exam  Constitutional: He is oriented to person, place, and time and well-developed, well-nourished, and in no distress. No distress.  HENT:  Head: Normocephalic.  Eyes: No scleral icterus.  Neck: Normal range of motion. Neck supple. No JVD present. No tracheal deviation present.  Cardiovascular: Normal rate, regular rhythm and normal heart sounds.  Exam reveals no gallop and no friction rub.   No murmur heard. Pulmonary/Chest: Effort normal and breath  sounds normal. No respiratory distress. He has no wheezes. He has no rales. He exhibits no tenderness.  Abdominal: Soft. Bowel sounds are normal. He exhibits no distension and no mass. There is no tenderness. There is no rebound and no guarding.  Musculoskeletal: Normal range of motion. He exhibits no edema.  Neurological: He is alert and oriented to person, place, and time. Coordination abnormal.  Left facial droop 4/5 LUE/LLE  Skin: Skin is warm. No rash noted. No erythema.  Psychiatric: Affect and judgment normal.      LABORATORY PANEL:   CBC  Recent Labs Lab 01/30/16 1207  WBC 5.0  HGB 15.7  HCT 46.5  PLT 217   ------------------------------------------------------------------------------------------------------------------  Chemistries   Recent Labs Lab 01/30/16 1207  NA 135  K 3.5  CL 102  CO2 24  GLUCOSE 347*  BUN 16  CREATININE 0.94  CALCIUM 9.4  AST 25  ALT 19  ALKPHOS 89  BILITOT 0.5   ------------------------------------------------------------------------------------------------------------------  Cardiac Enzymes  Recent Labs Lab 01/30/16 1207  TROPONINI <0.03   ------------------------------------------------------------------------------------------------------------------  RADIOLOGY:  Ct Head Wo Contrast  Result Date: 01/30/2016 CLINICAL DATA:  Sudden onset of difficulty walking and imbalance since yesterday. Headache. Left arm drooping. EXAM: CT HEAD WITHOUT CONTRAST TECHNIQUE: Contiguous axial images were obtained from the base of the skull through the vertex without intravenous contrast. COMPARISON:  None. FINDINGS: Brain: There is a large area of hypoattenuation in the right supratentorial brain, involving posterior aspect of right frontal, right parietal and right occipital lobe. The ventricular system is normal. Diffuse bilateral changes of deep white matter mild microangiopathy. No evidence of hemorrhagic transformation. No evidence of  midline shift. Vascular: No hyperdense  vessels. Calcific atherosclerotic disease of bilateral vertebral arteries noted. Skull: Normal. Negative for fracture or focal lesion. Sinuses/Orbits: No acute finding. Other: None. IMPRESSION: Large area of abnormal hypoattenuation involving right supratentorial brain, mainly posterior aspect of right frontal lobe, right parietal lobe and right occipital lobe. This may represent an age-indeterminate ischemic infarction, subacute hemorrhage or vasogenic edema. These results were called by telephone at the time of interpretation on 01/30/2016 at 12:44 pm to Dr. Roxan Hockey, who verbally acknowledged these results. Electronically Signed   By: Ted Mcalpine M.D.   On: 01/30/2016 12:47   Mr Angiogram Head Wo Contrast  Result Date: 01/30/2016 CLINICAL DATA:  Acute/subacute posterior right MCA territory infarct. EXAM: MRA HEAD WITHOUT CONTRAST TECHNIQUE: Angiographic images of the Circle of Willis were obtained using MRA technique without intravenous contrast. COMPARISON:  MRI brain from the same day. FINDINGS: Mild atherosclerotic changes are present within the left cavernous internal carotid artery. There is no significant stenosis through the ICA terminus bilaterally. The right AA once segment is intact. The anterior communicating artery is patent. Artifactual signal loss is present with an anterior communicating artery. There is no left A1 segment. There is significant signal loss the distal right M1 segment. The posterior right M2 segment is occluded. There is some attenuation of the distal left M1 segment. The left MCA bifurcation is intact. There is significant attenuation of the superior and posterior M2 branches on the left. Extensive small vessel disease is present. Moderate to high-grade stenosis is present in the right A2 segment. The vertebral arteries are codominant. The left PICA origin is below the field of view. The right PICA is not visualized. The basilar  artery is normal. Both posterior cerebral arteries originate from basilar tip. There is significant attenuation of distal P2 segments bilaterally, left greater than right. IMPRESSION: 1. High-grade stenosis versus occlusion of the posterior inferior right MCA branch vessels compatible with the area of acute infarction. 2. High-grade stenosis of the distal right M1 segment and proximal superior M2 division. 3. Mild narrowing of the distal left M1 segment with moderate to severe stenoses in the proximal M2 segments on the left. 4. Moderate to severe stenosis in the right A2 segment. 5. Moderate to severe PCA stenoses bilaterally. MRA can over exaggerate intracranial stenoses. CTA could be used for further evaluation if additional detail is warranted, for example, intracranial intervention. Electronically Signed   By: Marin Roberts M.D.   On: 01/30/2016 16:00   Mr Maxine Glenn Neck W Wo Contrast  Result Date: 01/30/2016 CLINICAL DATA:  Continued surveillance cerebral infarction, RIGHT MCA territory. LEFT-sided weakness. Symptoms for 24 hours. Stroke risk factors include hypertension and diabetes. EXAM: MRA NECK WITHOUT AND WITH CONTRAST TECHNIQUE: Multiplanar and multiecho pulse sequences of the neck were obtained without and with intravenous contrast. Angiographic images of the neck were obtained using MRA technique without and with intravenous contrast. CONTRAST:  MultiHance 20 mL. COMPARISON:  MRI brain and MRA intracranial reported separately. FINDINGS: Conventional branching of the great vessels from the arch. No focal stenosis at the origin of the innominate, common carotid arteries, or subclavian arteries. Mildly decreased caliber of the proximal 4 cm LEFT common carotid artery, suspected circumferential atheromatous change. The RIGHT carotid bifurcation demonstrates normal distal common carotid artery and proximal RIGHT internal carotid artery. Focal narrowing of the RIGHT external carotid artery, 50-75%.  Cervical ICA widely patent without evidence for dissection. LEFT carotid bifurcation demonstrates minimal posterior wall plaque at the origin of the LEFT ICA. No distal common  carotid artery or proximal external carotid artery atheromatous change. No evidence for ICA dissection. LEFT vertebral is widely patent. RIGHT vertebral demonstrates tortuosity at its origin, no definite ostial stenosis. Both vessels contribute to the basilar equally. There is some artifactual signal loss at the distal vertebral arteries bilaterally related to slice selection. IMPRESSION: No extracranial ICA stenosis or dissection is observed. Specifically, no extracranial cause is noted for the observed RIGHT MCA territory infarct. Electronically Signed   By: Elsie StainJohn T Curnes M.D.   On: 01/30/2016 16:18   Mr Brain Wo Contrast  Result Date: 01/30/2016 CLINICAL DATA:  Headache and left upper extremity weakness. Off balance over the last 24 hours. Symptoms began approximately 24 hours ago. EXAM: MRI HEAD WITHOUT CONTRAST TECHNIQUE: Multiplanar, multiecho pulse sequences of the brain and surrounding structures were obtained without intravenous contrast. COMPARISON:  CT head from the same day. FINDINGS: Brain: Diffusion-weighted imaging confirms an acute/ subacute nonhemorrhagic infarct involving the posterior insular cortex and right frontal operculum. Diffusion changes extend superiorly within the white matter in the posterior temporal and parietal lobe. Age advanced atrophy and white matter changes are present bilaterally in addition to this acute infarct. A remote lacunar infarct is present in the globus pallidus on the left. The ventricles are of normal size. No significant extra-axial fluid collection is present. The brainstem and cerebellum are normal. The internal auditory canals are within normal limits bilaterally. Vascular: Flow is present in the major intracranial arteries. Skull and upper cervical spine: The skullbase is within  normal limits. Midline sagittal structures are unremarkable. The craniocervical junction is normal. Marrow signal is within normal limits. The upper cervical spine is normal. Sinuses/Orbits: The paranasal sinuses and mastoid air cells are clear. The globes and orbits are within normal limits. IMPRESSION: 1. Large acute/subacute nonhemorrhagic infarct involving the posterior right insular cortex and operculum with extension superiorly in the posterior right temporal and parietal lobe. 2. No definite underlying mass. 3. Age advanced atrophy and diffuse white matter disease likely reflects the sequela of chronic microvascular ischemia. 4. Remote lacunar infarct within the left basal ganglia. Electronically Signed   By: Marin Robertshristopher  Mattern M.D.   On: 01/30/2016 15:40   Dg Chest Portable 1 View  Result Date: 01/30/2016 CLINICAL DATA:  50 year old diabetic hypertensive male with left upper extremity weakness. Initial encounter. EXAM: PORTABLE CHEST 1 VIEW COMPARISON:  None. FINDINGS: Two views obtained with different degrees of inspiration. Heart size top-normal. Central pulmonary vascular prominence without pulmonary edema. Minimal right perihilar peribronchial thickening may represent chronic changes. No segmental consolidation or pneumothorax. No plain film evidence of pulmonary malignancy. No acute osseous abnormality. IMPRESSION: No acute pulmonary abnormality. Electronically Signed   By: Lacy DuverneySteven  Olson M.D.   On: 01/30/2016 13:48     ASSESSMENT AND PLAN:   50 year old male with history of tobacco depends, diabetes and essential hypertension who presented with left sided weakness and found to have acute CVA.  1.Large acute/subacute nonhemorrhagic infarct involving the posterior right insular cortex and operculum with extension superiorly in the posterior right temporal and parietal lobe: Patient was started on aspirin and statin. He was encouraged to stop smoking He was evaluated by PT/OT and  neurology Carotid ultrasound showed no hemodynamically significant stenosis  2. Essential hypertension: Patient started on Norvasc at discharge. He has asymptomatic bradycardia and therefore beta blocker was not initiated  3. Diabetes: He will need close follow-up for diabetes. He will continue on Levemir  4. Tobacco dependence: Patient will be discharged on nicotine patch and  was encouraged to stop smoking.      Management plans discussed with the patient and he  is in agreement.  CODE STATUS: full  TOTAL TIME TAKING CARE OF THIS PATIENT: 30 minutes.     POSSIBLE D/C tomorrow, DEPENDING ON CLINICAL CONDITION.   Vaeda Westall M.D on 01/31/2016 at 10:49 AM  Between 7am to 6pm - Pager - [phone] After 6pm go to [email or web address] - Social research officer, governmentpassword [email or web address]  [facility]  Office  [phone]  CC: Primary care physician; No PCP Per Patient  Note: This dictation was prepared with Dragon dictation along with smaller phrase technology. Any transcriptional errors that result from this process are unintentional.

## 2016-01-31 NOTE — Progress Notes (Signed)
Inpatient Diabetes Program Recommendations  AACE/ADA: New Consensus Statement on Inpatient Glycemic Control (2015)  Target Ranges:  Prepandial:   less than 140 mg/dL      Peak postprandial:   less than 180 mg/dL (1-2 hours)      Critically ill patients:  140 - 180 mg/dL   Lab Results  Component Value Date   GLUCAP 212 (H) 01/31/2016   Results for [name], Henry (MRN [id number]) as of 01/31/2016 08:51  Ref. Range 01/30/2016 12:07  Glucose Latest Ref Range: 65 - 99 mg/dL 409347 (H)   Review of Glycemic Control:  Results for [name], Henry (MRN [id number]) as of 01/31/2016 08:51  Ref. Range 01/30/2016 12:21 01/30/2016 17:03 01/30/2016 20:43 01/31/2016 07:57  Glucose-Capillary Latest Ref Range: 65 - 99 mg/dL 956331 (H) 213445 (H) 086261 (H) 212 (H)   Diabetes history: Type 2 diabetes Outpatient Diabetes medications: Levemir 5 units daily (patient not taking) Current orders for Inpatient glycemic control:  Novolog sensitive tid with meals and HS, Levemir 5 units q HS  Inpatient Diabetes Program Recommendations:     According to case management note, patient is currently uninsured.  Patient states he will go next week to try and figure out medicaid status.  He states that he has not been taking insulin since last year.  He says when he checks his blood sugars they are in the 200's.  Explained that he needs MD to manage blood sugars/diabetes regularly.  Called and discussed with case management.  Unsure if patient can afford Levemir.  May need oral agents for affordability although patient states he cannot take metformin. Case manager also states patient is calling family to see if they can help him as well. Will order living well with diabetes booklet for patient as well.  Thanks, Beryl MeagerJenny Anchor Dwan, RN, BC-ADM Inpatient Diabetes Coordinator Pager [phone] (8a-5p)

## 2016-01-31 NOTE — Evaluation (Signed)
Physical Therapy Evaluation Patient Details Name: Phillips HayFloyd Sendejo MRN: [id number] DOB: 13-Mar-1965 Today's Date: 01/31/2016   History of Present Illness  50 y.o. male with a history of Diabetes and tobacco dependence who presents with difficulty walking, L sided weakness and coordination issues.  Imaging reveals CVA.  Clinical Impression  Pt initially very sleepy, but did wake up and participate well with PT.  He did not seem to appreciate his L sided weakness and coordination issues until we had walked a little and he saw how deliberate he had to be to advance L foot and hold walker with L hand.  He was inconsistent and though he did not have any LOBs he generally did not stay centered in the walker, often kicked the rear leg with L foot or dragged the L foot and did not advance it through enough.  Pt did improve with constant reminders/cuing for gait training, but ultimately was considerably different from his baseline and will need continued PT intervention.     Follow Up Recommendations Outpatient PT    Equipment Recommendations   (Pt has walker at home)    Recommendations for Other Services       Precautions / Restrictions Restrictions Weight Bearing Restrictions: No      Mobility  Bed Mobility Overal bed mobility: Modified Independent             General bed mobility comments: Pt heavily reliant on rails and though he showed good effort was generally weak and initially showed poor sitting balance and needed heavy R UE assist to maintain sitting  Transfers Overall transfer level: Modified independent Equipment used: Rolling walker (2 wheeled)             General transfer comment: Pt was able to rise to standing w/o direct assist but L LE was unsteady and he needed heavy walker use to maintain standing balnace  Ambulation/Gait Ambulation/Gait assistance: Min guard Ambulation Distance (Feet): 200 Feet Assistive device: Rolling walker (2 wheeled)       General  Gait Details: Pt with poor ability to maintain good/consistent grip with L hand on walker and L LE showed poor coordination, placement, and overall he was limited with balance, positioning and general quality of cadence t/o bout of ambulation.  He was able to take a few steps with light HHA but had signficant lateral lean and lacked confidence with L stance phase  Stairs            Wheelchair Mobility    Modified Rankin (Stroke Patients Only)       Balance Overall balance assessment: Needs assistance   Sitting balance-Leahy Scale: Fair       Standing balance-Leahy Scale: Fair Standing balance comment: Pt leaning to the L and needing some UE assist to maintain balance                             Pertinent Vitals/Pain Pain Assessment: No/denies pain    Home Living Family/patient expects to be discharged to:: Private residence Living Arrangements: Other relatives;Parent Available Help at Discharge: Family   Home Access: Level entry       Home Equipment: Walker - 2 wheels      Prior Function Level of Independence: Independent         Comments: Pt reports that he was able to be relatively active, out of the house regularly     Hand Dominance  Extremity/Trunk Assessment   Upper Extremity Assessment Upper Extremity Assessment: LUE deficits/detail LUE Deficits / Details: L UE grossly 3-/5, displays decreased coordination difficulty with object manipulation    Lower Extremity Assessment Lower Extremity Assessment: LLE deficits/detail LLE Deficits / Details: grossly 3+/5, decreased coordination       Communication   Communication: No difficulties  Cognition Arousal/Alertness: Lethargic Behavior During Therapy: WFL for tasks assessed/performed Overall Cognitive Status: Within Functional Limits for tasks assessed                      General Comments      Exercises     Assessment/Plan    PT Assessment Patient needs  continued PT services  PT Problem List Decreased coordination;Decreased activity tolerance;Decreased balance;Decreased range of motion;Decreased strength;Decreased mobility;Decreased safety awareness;Decreased knowledge of use of DME          PT Treatment Interventions DME instruction;Gait training;Functional mobility training;Therapeutic activities;Therapeutic exercise;Balance training;Neuromuscular re-education;Patient/family education    PT Goals (Current goals can be found in the Care Plan section)  Acute Rehab PT Goals Patient Stated Goal: go home PT Goal Formulation: With patient Time For Goal Achievement: 02/07/16 Potential to Achieve Goals: Good    Frequency 7X/week   Barriers to discharge        Co-evaluation               End of Session Equipment Utilized During Treatment: Gait belt Activity Tolerance: Patient tolerated treatment well Patient left: with bed alarm set;with call bell/phone within reach           Time: 0829-0856 PT Time Calculation (min) (ACUTE ONLY): 27 min   Charges:   PT Evaluation $PT Eval Low Complexity: 1 Procedure PT Treatments $Gait Training: 8-22 mins   PT G Codes:        Malachi ProGalen R Ray Glacken, DPT 01/31/2016, 9:53 AM

## 2016-01-31 NOTE — Progress Notes (Signed)
SLP Cancellation Note  Patient Details Name: Henry Munoz MRN: [id number] DOB: 03/14/1965   Cancelled treatment:       Reason Eval/Treat Not Completed: SLP screened, no needs identified, will sign off (chart reviewed; NSG consulted)  Met w/ pt who denied any deficits in swallowing or speech-language abilities. Pt conversed w/ SLP and Dining services staff to order meal stating he was doing "fine". Pt was eating his breakfast meal w/ no deficits noted, or reported by pt. Pt does appear min weak at this time and is being followed by PT. NSG to reconsult if any change in status while admitted.    Orinda Kenner, MS, CCC-SLP Watson,Katherine 01/31/2016, 9:32 AM

## 2016-01-31 NOTE — Progress Notes (Signed)
Made Dr. Juliene PinaMody aware of current BP and pt feeling dizzy like CBG is low.  CBG is 255.  One time order for hydralazine 10 mg given.  Dr. Juliene PinaMody still ok with pt discharging once echo done and seen by neurology.  Orson Apeanielle Melaine Mcphee, RN

## 2016-01-31 NOTE — Consult Note (Signed)
Referring Physician: Dr. Juliene PinaMody    Chief Complaint: L sided weakness   HPI: Henry Munoz is an 50 y.o. male with a known history of Diabetes and tobacco dependence who presents eith unsteady gait and L sided weakness. 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 Diabetes mellitus without complication (HCC)   . Hypertension     History reviewed. No pertinent surgical history.  History reviewed. No pertinent family history. Social History:  reports that he has been smoking Cigarettes.  He has been smoking about 0.50 packs per day. He has never used smokeless tobacco. He reports that he does not drink alcohol or use drugs.  Allergies: No Known Allergies  Medications: I have reviewed the patient's current medications.  ROS: History obtained from the patient  General ROS: negative for - chills, fatigue, fever, night sweats, weight gain or weight loss Psychological ROS: negative for - behavioral disorder, hallucinations, memory difficulties, mood swings or suicidal ideation Ophthalmic ROS: negative for - blurry vision, double vision, eye pain or loss of vision ENT ROS: negative for - epistaxis, nasal discharge, oral lesions, sore throat, tinnitus or vertigo Allergy and Immunology ROS: negative for - hives or itchy/watery eyes Hematological and Lymphatic ROS: negative for - bleeding problems, bruising or swollen lymph nodes Endocrine ROS: negative for - galactorrhea, hair pattern changes, polydipsia/polyuria or temperature intolerance Respiratory ROS: negative for - cough, hemoptysis, shortness of breath or wheezing Cardiovascular ROS: negative for - chest pain, dyspnea on exertion, edema or irregular heartbeat Gastrointestinal ROS: negative for - abdominal pain, diarrhea, hematemesis, nausea/vomiting or stool incontinence Genito-Urinary ROS: negative for - dysuria, hematuria, incontinence or urinary frequency/urgency Musculoskeletal  ROS: negative for - joint swelling or muscular weakness Neurological ROS: as noted in HPI Dermatological ROS: negative for rash and skin lesion changes  Physical Examination: Blood pressure 134/79, pulse 80, temperature 98.4 F (36.9 C), temperature source Oral, resp. rate 19, height 5\' 7"  (1.702 m), weight 121.1 kg (267 lb), SpO2 96 %.   Neurological Examination Mental Status: Alert, oriented, thought content appropriate.  Speech fluent without evidence of aphasia.  Able to follow 3 step commands without difficulty. Cranial Nerves: II: Discs flat bilaterally; Visual fields grossly normal, pupils equal, round, reactive to light and accommodation III,IV, VI: ptosis not present, extra-ocular motions intact bilaterally V,VII: smile symmetric, facial light touch sensation normal bilaterally VIII: hearing normal bilaterally IX,X: gag reflex present XI: bilateral shoulder shrug XII: midline tongue extension Motor: Right : Upper extremity   5/5    Left:     Upper extremity   4+/5  Lower extremity   5/5     Lower extremity   4+/5 Tone and bulk:normal tone throughout; no atrophy noted Sensory: Pinprick and light touch intact throughout, bilaterally Deep Tendon Reflexes: 2+ and symmetric throughout Plantars: Right: downgoing   Left: downgoing Cerebellar: normal finger-to-nose, normal rapid alternating movements and normal heel-to-shin test Gait: not tested       Laboratory Studies:  Basic Metabolic Panel:  Recent Labs Lab 01/30/16 1207  NA 135  K 3.5  CL 102  CO2 24  GLUCOSE 347*  BUN 16  CREATININE 0.94  CALCIUM 9.4    Liver Function Tests:  Recent Labs Lab 01/30/16 1207  AST 25  ALT 19  ALKPHOS 89  BILITOT 0.5  PROT 7.3  ALBUMIN 4.1   No results for input(s): LIPASE, AMYLASE in the last 168 hours. No results for  input(s): AMMONIA in the last 168 hours.  CBC:  Recent Labs Lab 01/30/16 1207  WBC 5.0  NEUTROABS 2.6  HGB 15.7  HCT 46.5  MCV 93.9  PLT  217    Cardiac Enzymes:  Recent Labs Lab 01/30/16 1207  TROPONINI <0.03    BNP: Invalid input(s): POCBNP  CBG:  Recent Labs Lab 01/30/16 1703 01/30/16 2043 01/31/16 0757 01/31/16 0934 01/31/16 1134  GLUCAP 445* 261* 212* 255* 356*    Microbiology: No results found for this or any previous visit.  Coagulation Studies:  Recent Labs  01/30/16 1207  LABPROT 13.4  INR 1.02    Urinalysis: No results for input(s): COLORURINE, LABSPEC, PHURINE, GLUCOSEU, HGBUR, BILIRUBINUR, KETONESUR, PROTEINUR, UROBILINOGEN, NITRITE, LEUKOCYTESUR in the last 168 hours.  Invalid input(s): APPERANCEUR  Lipid Panel:    Component Value Date/Time   CHOL 227 (H) 01/31/2016 0532   TRIG 205 (H) 01/31/2016 0532   HDL 30 (L) 01/31/2016 0532   CHOLHDL 7.6 01/31/2016 0532   VLDL 41 (H) 01/31/2016 0532   LDLCALC 156 (H) 01/31/2016 0532    HgbA1C: No results found for: HGBA1C  Urine Drug Screen:  No results found for: LABOPIA, COCAINSCRNUR, LABBENZ, AMPHETMU, THCU, LABBARB  Alcohol Level: No results for input(s): ETH in the last 168 hours.  Other results: EKG: normal EKG, normal sinus rhythm, unchanged from previous tracings.  Imaging: Ct Head Wo Contrast  Result Date: 01/30/2016 CLINICAL DATA:  Sudden onset of difficulty walking and imbalance since yesterday. Headache. Left arm drooping. EXAM: CT HEAD WITHOUT CONTRAST TECHNIQUE: Contiguous axial images were obtained from the base of the skull through the vertex without intravenous contrast. COMPARISON:  None. FINDINGS: Brain: There is a large area of hypoattenuation in the right supratentorial brain, involving posterior aspect of right frontal, right parietal and right occipital lobe. The ventricular system is normal. Diffuse bilateral changes of deep white matter mild microangiopathy. No evidence of hemorrhagic transformation. No evidence of midline shift. Vascular: No hyperdense vessels. Calcific atherosclerotic disease of bilateral  vertebral arteries noted. Skull: Normal. Negative for fracture or focal lesion. Sinuses/Orbits: No acute finding. Other: None. IMPRESSION: Large area of abnormal hypoattenuation involving right supratentorial brain, mainly posterior aspect of right frontal lobe, right parietal lobe and right occipital lobe. This may represent an age-indeterminate ischemic infarction, subacute hemorrhage or vasogenic edema. These results were called by telephone at the time of interpretation on 01/30/2016 at 12:44 pm to Dr. Roxan Hockey, who verbally acknowledged these results. Electronically Signed   By: Ted Mcalpine M.D.   On: 01/30/2016 12:47   Mr Angiogram Head Wo Contrast  Result Date: 01/30/2016 CLINICAL DATA:  Acute/subacute posterior right MCA territory infarct. EXAM: MRA HEAD WITHOUT CONTRAST TECHNIQUE: Angiographic images of the Circle of Willis were obtained using MRA technique without intravenous contrast. COMPARISON:  MRI brain from the same day. FINDINGS: Mild atherosclerotic changes are present within the left cavernous internal carotid artery. There is no significant stenosis through the ICA terminus bilaterally. The right AA once segment is intact. The anterior communicating artery is patent. Artifactual signal loss is present with an anterior communicating artery. There is no left A1 segment. There is significant signal loss the distal right M1 segment. The posterior right M2 segment is occluded. There is some attenuation of the distal left M1 segment. The left MCA bifurcation is intact. There is significant attenuation of the superior and posterior M2 branches on the left. Extensive small vessel disease is present. Moderate to high-grade stenosis is present in the right  A2 segment. The vertebral arteries are codominant. The left PICA origin is below the field of view. The right PICA is not visualized. The basilar artery is normal. Both posterior cerebral arteries originate from basilar tip. There is  significant attenuation of distal P2 segments bilaterally, left greater than right. IMPRESSION: 1. High-grade stenosis versus occlusion of the posterior inferior right MCA branch vessels compatible with the area of acute infarction. 2. High-grade stenosis of the distal right M1 segment and proximal superior M2 division. 3. Mild narrowing of the distal left M1 segment with moderate to severe stenoses in the proximal M2 segments on the left. 4. Moderate to severe stenosis in the right A2 segment. 5. Moderate to severe PCA stenoses bilaterally. MRA can over exaggerate intracranial stenoses. CTA could be used for further evaluation if additional detail is warranted, for example, intracranial intervention. Electronically Signed   By: Marin Roberts M.D.   On: 01/30/2016 16:00   Mr Maxine Glenn Neck W Wo Contrast  Result Date: 01/30/2016 CLINICAL DATA:  Continued surveillance cerebral infarction, RIGHT MCA territory. LEFT-sided weakness. Symptoms for 24 hours. Stroke risk factors include hypertension and diabetes. EXAM: MRA NECK WITHOUT AND WITH CONTRAST TECHNIQUE: Multiplanar and multiecho pulse sequences of the neck were obtained without and with intravenous contrast. Angiographic images of the neck were obtained using MRA technique without and with intravenous contrast. CONTRAST:  MultiHance 20 mL. COMPARISON:  MRI brain and MRA intracranial reported separately. FINDINGS: Conventional branching of the great vessels from the arch. No focal stenosis at the origin of the innominate, common carotid arteries, or subclavian arteries. Mildly decreased caliber of the proximal 4 cm LEFT common carotid artery, suspected circumferential atheromatous change. The RIGHT carotid bifurcation demonstrates normal distal common carotid artery and proximal RIGHT internal carotid artery. Focal narrowing of the RIGHT external carotid artery, 50-75%. Cervical ICA widely patent without evidence for dissection. LEFT carotid bifurcation  demonstrates minimal posterior wall plaque at the origin of the LEFT ICA. No distal common carotid artery or proximal external carotid artery atheromatous change. No evidence for ICA dissection. LEFT vertebral is widely patent. RIGHT vertebral demonstrates tortuosity at its origin, no definite ostial stenosis. Both vessels contribute to the basilar equally. There is some artifactual signal loss at the distal vertebral arteries bilaterally related to slice selection. IMPRESSION: No extracranial ICA stenosis or dissection is observed. Specifically, no extracranial cause is noted for the observed RIGHT MCA territory infarct. Electronically Signed   By: Elsie Stain M.D.   On: 01/30/2016 16:18   Mr Brain Wo Contrast  Result Date: 01/30/2016 CLINICAL DATA:  Headache and left upper extremity weakness. Off balance over the last 24 hours. Symptoms began approximately 24 hours ago. EXAM: MRI HEAD WITHOUT CONTRAST TECHNIQUE: Multiplanar, multiecho pulse sequences of the brain and surrounding structures were obtained without intravenous contrast. COMPARISON:  CT head from the same day. FINDINGS: Brain: Diffusion-weighted imaging confirms an acute/ subacute nonhemorrhagic infarct involving the posterior insular cortex and right frontal operculum. Diffusion changes extend superiorly within the white matter in the posterior temporal and parietal lobe. Age advanced atrophy and white matter changes are present bilaterally in addition to this acute infarct. A remote lacunar infarct is present in the globus pallidus on the left. The ventricles are of normal size. No significant extra-axial fluid collection is present. The brainstem and cerebellum are normal. The internal auditory canals are within normal limits bilaterally. Vascular: Flow is present in the major intracranial arteries. Skull and upper cervical spine: The skullbase is within normal  limits. Midline sagittal structures are unremarkable. The craniocervical junction  is normal. Marrow signal is within normal limits. The upper cervical spine is normal. Sinuses/Orbits: The paranasal sinuses and mastoid air cells are clear. The globes and orbits are within normal limits. IMPRESSION: 1. Large acute/subacute nonhemorrhagic infarct involving the posterior right insular cortex and operculum with extension superiorly in the posterior right temporal and parietal lobe. 2. No definite underlying mass. 3. Age advanced atrophy and diffuse white matter disease likely reflects the sequela of chronic microvascular ischemia. 4. Remote lacunar infarct within the left basal ganglia. Electronically Signed   By: Marin Robertshristopher  Mattern M.D.   On: 01/30/2016 15:40   Dg Chest Portable 1 View  Result Date: 01/30/2016 CLINICAL DATA:  50 year old diabetic hypertensive male with left upper extremity weakness. Initial encounter. EXAM: PORTABLE CHEST 1 VIEW COMPARISON:  None. FINDINGS: Two views obtained with different degrees of inspiration. Heart size top-normal. Central pulmonary vascular prominence without pulmonary edema. Minimal right perihilar peribronchial thickening may represent chronic changes. No segmental consolidation or pneumothorax. No plain film evidence of pulmonary malignancy. No acute osseous abnormality. IMPRESSION: No acute pulmonary abnormality. Electronically Signed   By: Lacy DuverneySteven  Olson M.D.   On: 01/30/2016 13:48    Assessment: 50 y.o. male with a known history of Diabetes and tobacco dependence who presents eith unsteady gait and L sided weakness. On CTH pt has R MCA stroke that is subacute in nature   Stroke Risk Factors - diabetes mellitus, family history, hyperlipidemia, hypertension and smoking  Plan: MRI showing R MCA subacute stroke.  Pt has significant intracranial diz  Prophylactic therapy-Antiplatelet med: Aspirin - dose 81 and Antiplatelet med: Plavix - dose 75 Stopping smoking  Likely new DM  HTN treatment as pt was not on any medications at home.   Likely  d/c in AM tomorrow.    01/31/2016, 12:47 PM

## 2016-02-01 LAB — BASIC METABOLIC PANEL
Anion gap: 8 (ref 5–15)
BUN: 18 mg/dL (ref 6–20)
CALCIUM: 9.1 mg/dL (ref 8.9–10.3)
CO2: 27 mmol/L (ref 22–32)
CREATININE: 1 mg/dL (ref 0.61–1.24)
Chloride: 100 mmol/L — ABNORMAL LOW (ref 101–111)
GFR calc Af Amer: >60 mL/min (ref >60)
GLUCOSE: 197 mg/dL — AB (ref 65–99)
Potassium: 3.3 mmol/L — ABNORMAL LOW (ref 3.5–5.1)
Sodium: 135 mmol/L (ref 135–145)

## 2016-02-01 LAB — ECHOCARDIOGRAM COMPLETE
Height: 67 in
WEIGHTICAEL: 4272 [oz_av]

## 2016-02-01 LAB — HEMOGLOBIN A1C
HEMOGLOBIN A1C: 12.6 % — AB (ref 4.8–5.6)
MEAN PLASMA GLUCOSE: 315 mg/dL

## 2016-02-01 LAB — GLUCOSE, CAPILLARY: Glucose-Capillary: 219 mg/dL — ABNORMAL HIGH (ref 65–99)

## 2016-02-01 MED ORDER — HYDROCHLOROTHIAZIDE 50 MG PO TABS: 50.0000 mg | ORAL_TABLET | Freq: Every day | ORAL | 0 refills | Status: DC

## 2016-02-01 MED ORDER — CLOPIDOGREL BISULFATE 75 MG PO TABS: 75.0000 mg | ORAL_TABLET | Freq: Every day | ORAL | 0 refills | Status: DC

## 2016-02-01 NOTE — Progress Notes (Signed)
Discussed discharge instructions and medications with pt. Made him aware of follow up appointments he needs to make on Tuesday when offices are open again.  IV removed. All questions addressed. Pt transported home via car by his friend.  Orson Apeanielle Patte Winkel, RN

## 2016-02-01 NOTE — Discharge Summary (Signed)
[facility] [facility] [place] [facility]   PATIENT NAME: Henry Munoz    MR#:  [id number]  DATE OF BIRTH:  [birth year]  DATE OF ADMISSION:  01/30/2016 ADMITTING PHYSICIAN: Adrian SaranSital Jakayden Cancio, MD  DATE OF DISCHARGE: 02/01/2016  PRIMARY CARE PHYSICIAN: No PCP Per Patient    ADMISSION DIAGNOSIS:  Smoking [F17.200] Right-sided headache [R51] LUE weakness [R29.898] Essential hypertension [I10] Type 2 diabetes mellitus with hyperglycemia, without long-term current use of insulin (HCC) [E11.65] Cerebrovascular accident (CVA), unspecified mechanism (HCC) [I63.9]  DISCHARGE DIAGNOSIS:  Active Problems:   Stroke (cerebrum) (HCC)   SECONDARY DIAGNOSIS:   Past Medical History:  Diagnosis Date  . Diabetes mellitus without complication (HCC)   . Hypertension     HOSPITAL COURSE:   50 year old male with history of tobacco depends, diabetes and essential hypertension who presented with leftsided weakness and found to have acute CVA.  1.Large acute/subacute nonhemorrhagic infarct involving the posterior right insular cortex and operculum with extension superiorly in the posterior right temporal and parietal lobe: Patient was started on aspirin, plavix and statin. He was encouraged to stop smoking He was evaluated by PT and neurology With recommendations for outpatient physical therapy. Carotid ultrasound showed no hemodynamically significant stenosis Echocardiogram needs to be [facility]   2. Elevated Essential hypertension in the setting of CVA: Patient is started on Norvasc and HCTZ. He needs the NP in one week since he is on the HCTZ. He needs close monitoring of his blood pressure with goal systolic less than 1:30. He has asymptomatic bradycardia and therefore beta blocker was not initiated  3. Diabetes: He will need close follow-up for diabetes. He will continue on Levemir  4. Tobacco dependence: Patient will be discharged on nicotine patch  and was encouraged to stop smoking.    DISCHARGE CONDITIONS AND DIET:   Stable for discharge on diabetic diet  CONSULTS OBTAINED:  Treatment Team:  Pauletta BrownsYuriy Zeylikman, MD  DRUG ALLERGIES:  No Known Allergies  DISCHARGE MEDICATIONS:   Current Discharge Medication List    START taking these medications   Details  amLODipine (NORVASC) 10 MG tablet Take 1 tablet (10 mg total) by mouth daily. Qty: 30 tablet, Refills: 0    aspirin EC 81 MG EC tablet Take 1 tablet (81 mg total) by mouth daily. Qty: 120 tablet, Refills: 0    atorvastatin (LIPITOR) 40 MG tablet Take 1 tablet (40 mg total) by mouth daily. Qty: 30 tablet, Refills: 0    clopidogrel (PLAVIX) 75 MG tablet Take 1 tablet (75 mg total) by mouth daily. Qty: 30 tablet, Refills: 0    hydrochlorothiazide (HYDRODIURIL) 50 MG tablet Take 1 tablet (50 mg total) by mouth daily. Qty: 30 tablet, Refills: 0    nicotine (NICODERM CQ - DOSED IN MG/24 HOURS) 14 mg/24hr patch Place 1 patch (14 mg total) onto the skin daily. Qty: 28 patch, Refills: 0      CONTINUE these medications which have CHANGED   Details  insulin detemir (LEVEMIR) 100 UNIT/ML injection Inject 0.08 mLs (8 Units total) into the skin at bedtime. Qty: 2.48 mL, Refills: 0      STOP taking these medications     cetirizine (ZYRTEC) 10 MG tablet      clotrimazole (LOTRIMIN) 1 % cream      diphenhydrAMINE (BENADRYL) 25 mg capsule      naproxen (NAPROSYN) 500 MG tablet      oxyCODONE-acetaminophen (ROXICET) 5-325 MG per tablet  Today   CHIEF COMPLAINT:  Patient continues to show improvement on left side   VITAL SIGNS:  Blood pressure 140/84, pulse 63, temperature 98.2 F (36.8 C), temperature source Oral, resp. rate 20, height 5\' 7"  (1.702 m), weight 121.1 kg (267 lb), SpO2 100 %.   REVIEW OF SYSTEMS:  Review of Systems  Constitutional: Negative.  Negative for chills, fever and malaise/fatigue.  HENT: Negative.  Negative for  ear discharge, ear pain, hearing loss, nosebleeds and sore throat.   Eyes: Negative.  Negative for blurred vision and pain.  Respiratory: Negative.  Negative for cough, hemoptysis, shortness of breath and wheezing.   Cardiovascular: Negative.  Negative for chest pain, palpitations and leg swelling.  Gastrointestinal: Negative.  Negative for abdominal pain, blood in stool, diarrhea, nausea and vomiting.  Genitourinary: Negative.  Negative for dysuria.  Musculoskeletal: Negative.  Negative for back pain.  Skin: Negative.   Neurological: Positive for sensory change, speech change and focal weakness. Negative for dizziness, tremors, seizures and headaches.  Endo/Heme/Allergies: Negative.  Does not bruise/bleed easily.  Psychiatric/Behavioral: Negative.  Negative for depression, hallucinations and suicidal ideas.     PHYSICAL EXAMINATION:  GENERAL:  50 y.o.-year-old patient lying in the bed with no acute distress.  NECK:  Supple, no jugular venous distention. No thyroid enlargement, no tenderness.  LUNGS: Normal breath sounds bilaterally, no wheezing, rales,rhonchi  No use of accessory muscles of respiration.  CARDIOVASCULAR: S1, S2 normal. No murmurs, rubs, or gallops.  ABDOMEN: Soft, non-tender, non-distended. Bowel sounds present. No organomegaly or mass.  EXTREMITIES: No pedal edema, cyanosis, or clubbing.  PSYCHIATRIC: The patient is alert and oriented x 3.  SKIN: No obvious rash, lesion, or ulcer.   Neuro: Minimal left-sided facial droop which is improving and left upper and left lower extremity strength 4-5 coordination of the left side is off gait not tested  DATA REVIEW:   CBC  Recent Labs Lab 01/30/16 1207  WBC 5.0  HGB 15.7  HCT 46.5  PLT 217    Chemistries   Recent Labs Lab 01/30/16 1207 02/01/16 0453  NA 135 135  K 3.5 3.3*  CL 102 100*  CO2 24 27  GLUCOSE 347* 197*  BUN 16 18  CREATININE 0.94 1.00  CALCIUM 9.4 9.1  AST 25  --   ALT 19  --   ALKPHOS 89   --   BILITOT 0.5  --     Cardiac Enzymes  Recent Labs Lab 01/30/16 1207  TROPONINI <0.03    Microbiology Results  @MICRORSLT48 @  RADIOLOGY:  Ct Head Wo Contrast  Result Date: 01/30/2016 CLINICAL DATA:  Sudden onset of difficulty walking and imbalance since yesterday. Headache. Left arm drooping. EXAM: CT HEAD WITHOUT CONTRAST TECHNIQUE: Contiguous axial images were obtained from the base of the skull through the vertex without intravenous contrast. COMPARISON:  None. FINDINGS: Brain: There is a large area of hypoattenuation in the right supratentorial brain, involving posterior aspect of right frontal, right parietal and right occipital lobe. The ventricular system is normal. Diffuse bilateral changes of deep white matter mild microangiopathy. No evidence of hemorrhagic transformation. No evidence of midline shift. Vascular: No hyperdense vessels. Calcific atherosclerotic disease of bilateral vertebral arteries noted. Skull: Normal. Negative for fracture or focal lesion. Sinuses/Orbits: No acute finding. Other: None. IMPRESSION: Large area of abnormal hypoattenuation involving right supratentorial brain, mainly posterior aspect of right frontal lobe, right parietal lobe and right occipital lobe. This may represent an age-indeterminate ischemic infarction, subacute hemorrhage or vasogenic edema. These  results were called by telephone at the time of interpretation on 01/30/2016 at 12:44 pm to Dr. Roxan Hockey, who verbally acknowledged these results. Electronically Signed   By: Ted Mcalpine M.D.   On: 01/30/2016 12:47   Mr Angiogram Head Wo Contrast  Result Date: 01/30/2016 CLINICAL DATA:  Acute/subacute posterior right MCA territory infarct. EXAM: MRA HEAD WITHOUT CONTRAST TECHNIQUE: Angiographic images of the Circle of Willis were obtained using MRA technique without intravenous contrast. COMPARISON:  MRI brain from the same day. FINDINGS: Mild atherosclerotic changes are present within  the left cavernous internal carotid artery. There is no significant stenosis through the ICA terminus bilaterally. The right AA once segment is intact. The anterior communicating artery is patent. Artifactual signal loss is present with an anterior communicating artery. There is no left A1 segment. There is significant signal loss the distal right M1 segment. The posterior right M2 segment is occluded. There is some attenuation of the distal left M1 segment. The left MCA bifurcation is intact. There is significant attenuation of the superior and posterior M2 branches on the left. Extensive small vessel disease is present. Moderate to high-grade stenosis is present in the right A2 segment. The vertebral arteries are codominant. The left PICA origin is below the field of view. The right PICA is not visualized. The basilar artery is normal. Both posterior cerebral arteries originate from basilar tip. There is significant attenuation of distal P2 segments bilaterally, left greater than right. IMPRESSION: 1. High-grade stenosis versus occlusion of the posterior inferior right MCA branch vessels compatible with the area of acute infarction. 2. High-grade stenosis of the distal right M1 segment and proximal superior M2 division. 3. Mild narrowing of the distal left M1 segment with moderate to severe stenoses in the proximal M2 segments on the left. 4. Moderate to severe stenosis in the right A2 segment. 5. Moderate to severe PCA stenoses bilaterally. MRA can over exaggerate intracranial stenoses. CTA could be used for further evaluation if additional detail is warranted, for example, intracranial intervention. Electronically Signed   By: Marin Roberts M.D.   On: 01/30/2016 16:00   Mr Maxine Glenn Neck W Wo Contrast  Result Date: 01/30/2016 CLINICAL DATA:  Continued surveillance cerebral infarction, RIGHT MCA territory. LEFT-sided weakness. Symptoms for 24 hours. Stroke risk factors include hypertension and diabetes.  EXAM: MRA NECK WITHOUT AND WITH CONTRAST TECHNIQUE: Multiplanar and multiecho pulse sequences of the neck were obtained without and with intravenous contrast. Angiographic images of the neck were obtained using MRA technique without and with intravenous contrast. CONTRAST:  MultiHance 20 mL. COMPARISON:  MRI brain and MRA intracranial reported separately. FINDINGS: Conventional branching of the great vessels from the arch. No focal stenosis at the origin of the innominate, common carotid arteries, or subclavian arteries. Mildly decreased caliber of the proximal 4 cm LEFT common carotid artery, suspected circumferential atheromatous change. The RIGHT carotid bifurcation demonstrates normal distal common carotid artery and proximal RIGHT internal carotid artery. Focal narrowing of the RIGHT external carotid artery, 50-75%. Cervical ICA widely patent without evidence for dissection. LEFT carotid bifurcation demonstrates minimal posterior wall plaque at the origin of the LEFT ICA. No distal common carotid artery or proximal external carotid artery atheromatous change. No evidence for ICA dissection. LEFT vertebral is widely patent. RIGHT vertebral demonstrates tortuosity at its origin, no definite ostial stenosis. Both vessels contribute to the basilar equally. There is some artifactual signal loss at the distal vertebral arteries bilaterally related to slice selection. IMPRESSION: No extracranial ICA stenosis or dissection  is observed. Specifically, no extracranial cause is noted for the observed RIGHT MCA territory infarct. Electronically Signed   By: Elsie StainJohn T Curnes M.D.   On: 01/30/2016 16:18   Mr Brain Wo Contrast  Result Date: 01/30/2016 CLINICAL DATA:  Headache and left upper extremity weakness. Off balance over the last 24 hours. Symptoms began approximately 24 hours ago. EXAM: MRI HEAD WITHOUT CONTRAST TECHNIQUE: Multiplanar, multiecho pulse sequences of the brain and surrounding structures were obtained  without intravenous contrast. COMPARISON:  CT head from the same day. FINDINGS: Brain: Diffusion-weighted imaging confirms an acute/ subacute nonhemorrhagic infarct involving the posterior insular cortex and right frontal operculum. Diffusion changes extend superiorly within the white matter in the posterior temporal and parietal lobe. Age advanced atrophy and white matter changes are present bilaterally in addition to this acute infarct. A remote lacunar infarct is present in the globus pallidus on the left. The ventricles are of normal size. No significant extra-axial fluid collection is present. The brainstem and cerebellum are normal. The internal auditory canals are within normal limits bilaterally. Vascular: Flow is present in the major intracranial arteries. Skull and upper cervical spine: The skullbase is within normal limits. Midline sagittal structures are unremarkable. The craniocervical junction is normal. Marrow signal is within normal limits. The upper cervical spine is normal. Sinuses/Orbits: The paranasal sinuses and mastoid air cells are clear. The globes and orbits are within normal limits. IMPRESSION: 1. Large acute/subacute nonhemorrhagic infarct involving the posterior right insular cortex and operculum with extension superiorly in the posterior right temporal and parietal lobe. 2. No definite underlying mass. 3. Age advanced atrophy and diffuse white matter disease likely reflects the sequela of chronic microvascular ischemia. 4. Remote lacunar infarct within the left basal ganglia. Electronically Signed   By: Marin Robertshristopher  Mattern M.D.   On: 01/30/2016 15:40   Dg Chest Portable 1 View  Result Date: 01/30/2016 CLINICAL DATA:  50 year old diabetic hypertensive male with left upper extremity weakness. Initial encounter. EXAM: PORTABLE CHEST 1 VIEW COMPARISON:  None. FINDINGS: Two views obtained with different degrees of inspiration. Heart size top-normal. Central pulmonary vascular prominence  without pulmonary edema. Minimal right perihilar peribronchial thickening may represent chronic changes. No segmental consolidation or pneumothorax. No plain film evidence of pulmonary malignancy. No acute osseous abnormality. IMPRESSION: No acute pulmonary abnormality. Electronically Signed   By: Lacy DuverneySteven  Olson M.D.   On: 01/30/2016 13:48      Management plans discussed with the patient and he is in agreement. Stable for discharge home  [facility]  CODE STATUS:     Code Status Orders        Start     Ordered   01/30/16 1403  Full code  Continuous     01/30/16 1404    Code Status History    Date Active Date Inactive Code Status Order ID Comments User Context   This patient has a current code status but no historical code status.      TOTAL TIME TAKING CARE OF THIS PATIENT: 37 minutes.    Note: This dictation was prepared with Dragon dictation along with smaller phrase technology. Any transcriptional errors that result from this process are unintentional.  Tisheena Maguire M.D on 02/01/2016 at 7:15 AM  Between 7am to 6pm - Pager - [phone] After 6pm go to [email or web address] - Social research officer, governmentpassword [email or web address]  [facility] [place] [facility]  Office  [phone]  CC: Primary care physician; No PCP Per Patient

## 2016-02-01 NOTE — Care Management Note (Addendum)
Case Management Note  Patient Details  Name: Phillips HayFloyd Pitstick MRN: [id number] Date of Birth: 08-24-1965  Subjective/Objective:           Provided [name] with a MATCH coupon.  Home health was set up yesterday with [facility].        Action/Plan:   Expected Discharge Date:                  Expected Discharge Plan:     In-House Referral:     Discharge planning Services     Post Acute Care Choice:    Choice offered to:     DME Arranged:    DME Agency:     HH Arranged:    HH Agency:     Status of Service:     If discussed at [employer] Length of Stay Meetings, dates discussed:    Additional Comments:  Shasta Chinn A, RN 02/01/2016, 9:43 AM

## 2016-02-03 NOTE — Care Management (Signed)
[facility] from [facility]. I have [facility] and to call this RNCM will result. He agrees. [facility] as it doesn't appear that application was delivered to patient.

## 2016-02-18 ENCOUNTER — Ambulatory Visit: Payer: Self-pay | Admitting: Pharmacy Technician

## 2016-02-18 DIAGNOSIS — Z79899 Other long term (current) drug therapy: Secondary | ICD-10-CM

## 2016-02-19 ENCOUNTER — Ambulatory Visit: Payer: Self-pay | Admitting: Adult Health Nurse Practitioner

## 2016-02-19 VITALS — BP 134/92 | HR 95 | Temp 98.8°F | Wt 245.8 lb

## 2016-02-19 DIAGNOSIS — E119 Type 2 diabetes mellitus without complications: Secondary | ICD-10-CM

## 2016-02-19 DIAGNOSIS — I10 Essential (primary) hypertension: Secondary | ICD-10-CM

## 2016-02-19 LAB — GLUCOSE, POCT (MANUAL RESULT ENTRY): POC GLUCOSE: 517 mg/dL — AB (ref 70–99)

## 2016-02-19 MED ORDER — INSULIN NPH ISOPHANE & REGULAR (70-30) 100 UNIT/ML ~~LOC~~ SUSP: 10.0000 [IU] | Freq: Two times a day (BID) | SUBCUTANEOUS | 0 refills | Status: DC

## 2016-02-19 MED ORDER — METFORMIN HCL 1000 MG PO TABS: 500.0000 mg | ORAL_TABLET | Freq: Two times a day (BID) | ORAL | 3 refills | Status: DC

## 2016-02-19 NOTE — Progress Notes (Signed)
  Patient: Henry Munoz Male    DOB: [birth year]   51 y.o.   MRN: [id number] Visit Date: 02/19/2016  Today's Provider: Jacelyn Pieah Doles-Johnson, NP   Chief Complaint  Patient presents with  . New Patient (Initial Visit)    stroke on 01/31/16, experiencing pain on left side    Subjective:    HPI    SP CVA in Dec 2017.  Carotid ultrasound with no hemodynamically significant stenosis.  PT coming to the house daily.   HTN:  BP borderline today.  Taking medications as directed.  Checking BP at home- running 130s.    DM:  A1C-12.6. Not taking insulin-levimir- does not have it, he cannot afford the insulin.      No Known Allergies Previous Medications   AMLODIPINE (NORVASC) 10 MG TABLET    Take 1 tablet (10 mg total) by mouth daily.   ASPIRIN EC 81 MG EC TABLET    Take 1 tablet (81 mg total) by mouth daily.   ATORVASTATIN (LIPITOR) 40 MG TABLET    Take 1 tablet (40 mg total) by mouth daily.   CLOPIDOGREL (PLAVIX) 75 MG TABLET    Take 1 tablet (75 mg total) by mouth daily.   HYDROCHLOROTHIAZIDE (HYDRODIURIL) 50 MG TABLET    Take 1 tablet (50 mg total) by mouth daily.    Review of Systems  All other systems reviewed and are negative.   Social History  Substance Use Topics  . Smoking status: Current Every Day Smoker    Packs/day: 0.50    Types: Cigarettes  . Smokeless tobacco: Never Used  . Alcohol use No     Comment: per week   Objective:   BP (!) 134/92   Pulse 95   Temp 98.8 F (37.1 C)   Wt 245 lb 12.8 oz (111.5 kg)   BMI 38.50 kg/m   Physical Exam  Constitutional: He is oriented to person, place, and time. He appears well-developed and well-nourished.  HENT:  Head: Normocephalic and atraumatic.  Eyes: Pupils are equal, round, and reactive to light.  Neck: Normal range of motion. Neck supple.  Cardiovascular: Normal rate and regular rhythm.   Pulmonary/Chest: Effort normal and breath sounds normal.  Abdominal: Soft. Bowel sounds are normal.  Neurological: He is  alert and oriented to person, place, and time.  Skin: Skin is warm and dry.  Vitals reviewed.       Assessment & Plan:       HTN:  BP borderline.  Monitor at next OV in 4 weeks.  IF still elevated add lisinopril.  Encouraged to check BP at home if able.  Low sodium diet.   DM:  Start Metformin 1000mg  BID.  Low sugar, carb diet low sodium.  Check CBG fasting 4 x weekly bring a log to next OV.  Increase exercise. Endo clinic referral.  Start Novolin 70/30 10 units BID.    Paperwork for charity care given for FU with neuorolgy and cardiology.  Tylenol for pain.  Told to avoid NSAIDs due to plavix use.   Smoking cessation counseling.        Jacelyn Pieah Doles-Johnson, NP   [facility]

## 2016-02-20 ENCOUNTER — Telehealth: Payer: Self-pay

## 2016-02-20 DIAGNOSIS — E118 Type 2 diabetes mellitus with unspecified complications: Secondary | ICD-10-CM

## 2016-02-20 MED ORDER — METFORMIN HCL 1000 MG PO TABS: 500.0000 mg | ORAL_TABLET | Freq: Two times a day (BID) | ORAL | 3 refills | Status: DC

## 2016-02-20 MED ORDER — INSULIN NPH ISOPHANE & REGULAR (70-30) 100 UNIT/ML ~~LOC~~ SUSP: 10.0000 [IU] | Freq: Two times a day (BID) | SUBCUTANEOUS | 0 refills | Status: DC

## 2016-02-20 NOTE — Progress Notes (Signed)
Met with patient completed financial assistance application for [employer] due to recent hospital visit.  Patient agreed to be responsible for gathering financial information and forwarding to appropriate department in [facility].    Completed [facility] application and contract.  Patient agreed to all terms of the [facility] contract.  Provided patient with Civil engineer, contracting based on his particular needs.    Patient stated that he uses Levemir.  Patient to see provider at [facility] on 02/19/16.  Once prescription is provided by [facility], the PAP application for the insulin will be completed.  [facility]

## 2016-02-20 NOTE — Telephone Encounter (Signed)
-----   Message from Sherilyn DacostaBetty J Kluttz sent at 02/20/2016  1:07 PM EST ----- Yes.  Please resend [facility].  Thank you. ----- Message ----- From: Audelia HivesAmanda E Micaiah Remillard, CMA Sent: 02/20/2016  10:44 AM To: Sherilyn DacostaBetty J Kluttz  Yes [facility]. Do you want me to resend them over to you?  ----- Message ----- From: Sherilyn DacostaBetty J Kluttz Sent: 02/20/2016  10:23 AM To: Larna DaughtersLorrie Holt, Tracy Salisbury, #  [facility] to the [facility], Maineeah Doles-Johnson sent prescriptions for Novolin 70/30 and Metformin to [facility] on [address], [place].  Should these prescriptions have been sent to [facility]?  Thanks.

## 2016-03-02 ENCOUNTER — Telehealth: Payer: Self-pay | Admitting: Pharmacy Technician

## 2016-03-02 NOTE — Telephone Encounter (Signed)
Novolin 70/30 PAP [facility].

## 2016-03-04 ENCOUNTER — Other Ambulatory Visit: Payer: Self-pay

## 2016-03-04 NOTE — Telephone Encounter (Signed)
Received PAP application from [facility] for Novolin 70/30 placed for provider to sign. 

## 2016-03-05 NOTE — Telephone Encounter (Signed)
Placed signed application/script in [facility] folder for pickup. 

## 2016-03-08 ENCOUNTER — Ambulatory Visit: Payer: Self-pay

## 2016-03-11 ENCOUNTER — Encounter: Payer: Self-pay | Admitting: Pharmacist

## 2016-03-23 ENCOUNTER — Ambulatory Visit: Payer: Self-pay

## 2016-03-30 ENCOUNTER — Ambulatory Visit: Payer: Self-pay

## 2016-04-27 ENCOUNTER — Ambulatory Visit: Payer: Self-pay | Admitting: Physician Assistant

## 2016-04-27 DIAGNOSIS — E1159 Type 2 diabetes mellitus with other circulatory complications: Secondary | ICD-10-CM

## 2016-04-27 DIAGNOSIS — E119 Type 2 diabetes mellitus without complications: Secondary | ICD-10-CM | POA: Insufficient documentation

## 2016-04-27 DIAGNOSIS — E118 Type 2 diabetes mellitus with unspecified complications: Secondary | ICD-10-CM

## 2016-04-27 MED ORDER — CLOPIDOGREL BISULFATE 75 MG PO TABS: 75.0000 mg | ORAL_TABLET | Freq: Every day | ORAL | 0 refills | Status: DC

## 2016-04-27 MED ORDER — METFORMIN HCL ER 500 MG PO TB24: 750.0000 mg | ORAL_TABLET | Freq: Two times a day (BID) | ORAL | Status: DC

## 2016-04-27 MED ORDER — INSULIN NPH ISOPHANE & REGULAR (70-30) 100 UNIT/ML ~~LOC~~ SUSP: 10.0000 [IU] | Freq: Two times a day (BID) | SUBCUTANEOUS | 0 refills | Status: DC

## 2016-04-27 MED ORDER — ASPIRIN 81 MG PO TBEC: 81.0000 mg | DELAYED_RELEASE_TABLET | Freq: Every day | ORAL | 0 refills | Status: DC

## 2016-04-27 MED ORDER — HYDROCHLOROTHIAZIDE 50 MG PO TABS: 50.0000 mg | ORAL_TABLET | Freq: Every day | ORAL | 0 refills | Status: DC

## 2016-04-27 MED ORDER — METFORMIN HCL ER (MOD) 1000 MG PO TB24: 1000.0000 mg | ORAL_TABLET | Freq: Two times a day (BID) | ORAL | 3 refills | Status: DC

## 2016-04-27 MED ORDER — AMLODIPINE BESYLATE 10 MG PO TABS: 10.0000 mg | ORAL_TABLET | Freq: Every day | ORAL | 0 refills | Status: DC

## 2016-04-27 MED ORDER — ATORVASTATIN CALCIUM 40 MG PO TABS: 40.0000 mg | ORAL_TABLET | Freq: Every day | ORAL | 0 refills | Status: DC
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
## 2016-04-27 NOTE — Patient Instructions (Addendum)
Check your blood sugar twice a day, before breakfast and before dinner. Bring your meter to the next appointment in 1 month so we can review your blood sugars and make changes to your medications if needed.   Check your blood sugar before you get on the treadmill and watch for low blood sugar (feeling shaky, dizzy, lightheaded).   Instead of shaking the 70/30 insulin, try to roll the vial to mix it up.   Try to avoid starchy foods that have lots of carbohydrates, like rice, potatoes, pasta, and breads.

## 2016-04-28 LAB — POCT GLYCOSYLATED HEMOGLOBIN (HGB A1C): Hemoglobin A1C: 14

## 2016-04-28 NOTE — Addendum Note (Signed)
Addended by: Smiley HousemanARTER, Landers Prajapati D on: 04/28/2016 09:21 AM   Modules accepted: Orders

## 2016-04-29 LAB — GLUCOSE, POCT (MANUAL RESULT ENTRY): POC Glucose: 414 mg/dl — AB (ref 70–99)

## 2016-04-29 NOTE — Addendum Note (Signed)
Addended by: Smiley HousemanARTER, Rifky Lapre D on: 04/29/2016 01:34 PM   Modules accepted: Orders

## 2016-05-25 ENCOUNTER — Ambulatory Visit: Payer: Self-pay

## 2016-06-05 ENCOUNTER — Emergency Department
Admission: EM | Admit: 2016-06-05 | Discharge: 2016-06-05 | Disposition: A | Discharge status: Home / Self Care | Payer: Self-pay | Attending: Emergency Medicine | Admitting: Emergency Medicine

## 2016-06-05 ENCOUNTER — Encounter: Payer: Self-pay | Admitting: Emergency Medicine

## 2016-06-05 DIAGNOSIS — R112 Nausea with vomiting, unspecified: Secondary | ICD-10-CM

## 2016-06-05 DIAGNOSIS — Z794 Long term (current) use of insulin: Secondary | ICD-10-CM | POA: Insufficient documentation

## 2016-06-05 DIAGNOSIS — Z7982 Long term (current) use of aspirin: Secondary | ICD-10-CM | POA: Insufficient documentation

## 2016-06-05 DIAGNOSIS — F17210 Nicotine dependence, cigarettes, uncomplicated: Secondary | ICD-10-CM | POA: Insufficient documentation

## 2016-06-05 DIAGNOSIS — N3001 Acute cystitis with hematuria: Secondary | ICD-10-CM | POA: Insufficient documentation

## 2016-06-05 DIAGNOSIS — E1165 Type 2 diabetes mellitus with hyperglycemia: Secondary | ICD-10-CM | POA: Insufficient documentation

## 2016-06-05 DIAGNOSIS — I10 Essential (primary) hypertension: Secondary | ICD-10-CM | POA: Insufficient documentation

## 2016-06-05 LAB — URINALYSIS, COMPLETE (UACMP) WITH MICROSCOPIC
BILIRUBIN URINE: NEGATIVE
Glucose, UA: >=500 mg/dL — AB
Ketones, ur: NEGATIVE mg/dL
LEUKOCYTES UA: NEGATIVE
Nitrite: NEGATIVE
PH: 5 (ref 5.0–8.0)
Protein, ur: 30 mg/dL — AB
SPECIFIC GRAVITY, URINE: 1.044 — AB (ref 1.005–1.030)

## 2016-06-05 LAB — CBC
HCT: 45.1 % (ref 40.0–52.0)
Hemoglobin: 15.3 g/dL (ref 13.0–18.0)
MCH: 31.2 pg (ref 26.0–34.0)
MCHC: 33.8 g/dL (ref 32.0–36.0)
MCV: 92.2 fL (ref 80.0–100.0)
PLATELETS: 238 10*3/uL (ref 150–440)
RBC: 4.89 MIL/uL (ref 4.40–5.90)
RDW: 13.2 % (ref 11.5–14.5)
WBC: 5.2 10*3/uL (ref 3.8–10.6)

## 2016-06-05 LAB — COMPREHENSIVE METABOLIC PANEL
ALT: 17 U/L (ref 17–63)
AST: 17 U/L (ref 15–41)
Albumin: 4.3 g/dL (ref 3.5–5.0)
Alkaline Phosphatase: 120 U/L (ref 38–126)
Anion gap: 11 (ref 5–15)
BILIRUBIN TOTAL: 0.8 mg/dL (ref 0.3–1.2)
BUN: 13 mg/dL (ref 6–20)
CALCIUM: 9.5 mg/dL (ref 8.9–10.3)
CHLORIDE: 99 mmol/L — AB (ref 101–111)
CO2: 25 mmol/L (ref 22–32)
CREATININE: 0.91 mg/dL (ref 0.61–1.24)
GFR calc Af Amer: >60 mL/min (ref >60)
Glucose, Bld: 317 mg/dL — ABNORMAL HIGH (ref 65–99)
Potassium: 3.8 mmol/L (ref 3.5–5.1)
Sodium: 135 mmol/L (ref 135–145)
Total Protein: 7.9 g/dL (ref 6.5–8.1)

## 2016-06-05 LAB — LIPASE, BLOOD: Lipase: 21 U/L (ref 11–51)

## 2016-06-05 LAB — TROPONIN I: Troponin I: <0.03 ng/mL (ref <0.03)

## 2016-06-05 LAB — GLUCOSE, CAPILLARY: GLUCOSE-CAPILLARY: 275 mg/dL — AB (ref 65–99)

## 2016-06-05 MED ORDER — ONDANSETRON HCL 4 MG/2ML IJ SOLN
4.0000 mg | Freq: Once | INTRAMUSCULAR | Status: AC
  Administered 2016-06-05: 4 mg via INTRAVENOUS
  Filled 2016-06-05: qty 2

## 2016-06-05 MED ORDER — CEPHALEXIN 500 MG PO CAPS: 500.0000 mg | ORAL_CAPSULE | Freq: Four times a day (QID) | ORAL | 0 refills | Status: AC

## 2016-06-05 MED ORDER — SODIUM CHLORIDE 0.9 % IV BOLUS (SEPSIS)
1000.0000 mL | Freq: Once | INTRAVENOUS | Status: AC
  Administered 2016-06-05: 1000 mL via INTRAVENOUS

## 2016-06-05 MED ORDER — CEFTRIAXONE SODIUM-DEXTROSE 1-3.74 GM-% IV SOLR
1.0000 g | Freq: Once | INTRAVENOUS | Status: AC
  Administered 2016-06-05: 1 g via INTRAVENOUS
  Filled 2016-06-05: qty 50

## 2016-06-05 MED ORDER — ONDANSETRON 4 MG PO TBDP
4.0000 mg | ORAL_TABLET | Freq: Once | ORAL | Status: AC | PRN
  Administered 2016-06-05: 4 mg via ORAL
  Filled 2016-06-05: qty 1

## 2016-06-05 MED ORDER — ONDANSETRON HCL 4 MG PO TABS: 4.0000 mg | ORAL_TABLET | Freq: Three times a day (TID) | ORAL | 0 refills | Status: DC | PRN

## 2016-06-05 MED ORDER — DEXTROSE 5 % IV SOLN: 1.0000 g | Freq: Once | INTRAVENOUS | Status: DC

## 2016-06-05 NOTE — ED Provider Notes (Signed)
[facility] Emergency Department Provider Note  ____________________________________________  Time seen: Approximately 4:07 PM  I have reviewed the triage vital signs and the nursing notes.   HISTORY  Chief Complaint Emesis   HPI Henry Munoz is a 51 y.o. male with a history of CVA on Plavix, diabetes, hypertension who presents for evaluation of vomiting. Patient reports 4-5 episodes of nonbloody nonbilious emesis a day for the last 3 days. No diarrhea, fever, chills, abdominal pain, chest pain, palpitations, shortness of breath, URI symptoms, dysuria or hematuria. He has been unable to keep his medications down. He reports that his sugars have been normal home. Last episode of vomiting was here in the waiting room. No melena, no hematemesis, no hematochezia, no coffee-ground emesis. Last bowel movement was yesterday and was normal. No abdominal distention, no prior abdominal surgeries, patient's passing flatus.  Past Medical History:  Diagnosis Date  . Diabetes mellitus without complication (HCC)   . Hypertension     Patient Active Problem List   Diagnosis Date Noted  . 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 Essential hypertension 02/19/2016  . Stroke (cerebrum) (HCC) 01/30/2016    History reviewed. No pertinent surgical history.  Prior to Admission medications   Medication Sig Start Date End Date Taking? Authorizing Provider  amLODipine (NORVASC) 10 MG tablet Take 1 tablet (10 mg total) by mouth daily. 04/27/16   Nehemiah Settle, PA  aspirin 81 MG EC tablet Take 1 tablet (81 mg total) by mouth daily. 04/27/16   Nehemiah Settle, PA  atorvastatin (LIPITOR) 40 MG tablet Take 1 tablet (40 mg total) by mouth daily. 04/27/16   Nehemiah Settle, PA  cephALEXin (KEFLEX) 500 MG capsule Take 1 capsule (500 mg total) by mouth 4 (four) times daily. 06/05/16 06/12/16  Nita Sickle, MD  clopidogrel (PLAVIX) 75 MG tablet  Take 1 tablet (75 mg total) by mouth daily. 04/27/16   Nehemiah Settle, PA  hydrochlorothiazide (HYDRODIURIL) 50 MG tablet Take 1 tablet (50 mg total) by mouth daily. 04/27/16   Nehemiah Settle, PA  insulin NPH-regular Human (NOVOLIN 70/30) (70-30) 100 UNIT/ML injection Inject 10 Units into the skin 2 (two) times daily with a meal. 04/27/16   Nehemiah Settle, PA  metFORMIN (GLUMETZA) 1000 MG (MOD) 24 hr tablet Take 1 tablet (1,000 mg total) by mouth 2 (two) times daily with a meal. 04/27/16 07/26/16  Nehemiah Settle, PA  ondansetron (ZOFRAN) 4 MG tablet Take 1 tablet (4 mg total) by mouth every 8 (eight) hours as needed for nausea or vomiting. 06/05/16   Nita Sickle, MD    Allergies Patient has no known allergies.  History reviewed. No pertinent family history.  Social History Social History  Substance Use Topics  . Smoking status: Current Every Day Smoker    Packs/day: 0.50    Types: Cigarettes  . Smokeless tobacco: Never Used  . Alcohol use No     Comment: per week    Review of Systems  Constitutional: Negative for fever. Eyes: Negative for visual changes. ENT: Negative for sore throat. Neck: No neck pain  Cardiovascular: Negative for chest pain. Respiratory: Negative for shortness of breath. Gastrointestinal: Negative for abdominal pain, diarrhea. + vomiting Genitourinary: Negative for dysuria. Musculoskeletal: Negative for back pain. Skin: Negative for rash. Neurological: Negative for headaches, weakness or numbness. Psych: No SI or HI  ____________________________________________   PHYSICAL EXAM:  VITAL SIGNS: ED Triage Vitals  Enc Vitals Group  BP 06/05/16 1442 (!) 161/99     Pulse Rate 06/05/16 1442 96     Resp 06/05/16 1442 18     Temp 06/05/16 1442 99.1 F (37.3 C)     Temp Source 06/05/16 1442 Oral     SpO2 06/05/16 1442 98 %     Weight 06/05/16 1441 246 lb (111.6 kg)     Height 06/05/16 1441  (1.702 m)     Head Circumference --      Peak Flow  --      Pain Score --      Pain Loc --      Pain Edu? --      Excl. in GC? --     Constitutional: Alert and oriented. Well appearing and in no apparent distress. HEENT:      Head: Normocephalic and atraumatic.         Eyes: Conjunctivae are normal. Sclera is non-icteric. EOMI. PERRL      Mouth/Throat: Mucous membranes are moist.       Neck: Supple with no signs of meningismus. Cardiovascular: Regular rate and rhythm. No murmurs, gallops, or rubs. 2+ symmetrical distal pulses are present in all extremities. No JVD. Respiratory: Normal respiratory effort. Lungs are clear to auscultation bilaterally. No wheezes, crackles, or rhonchi.  Gastrointestinal: Soft, non tender, and non distended with positive bowel sounds. No rebound or guarding. Genitourinary: No CVA tenderness. Musculoskeletal: Nontender with normal range of motion in all extremities. No edema, cyanosis, or erythema of extremities. Neurologic: Normal speech and language. Face is symmetric. Moving all extremities. No gross focal neurologic deficits are appreciated. Skin: Skin is warm, dry and intact. No rash noted. Psychiatric: Mood and affect are normal. Speech and behavior are normal.  ____________________________________________   LABS (all labs ordered are listed, but only abnormal results are displayed)  Labs Reviewed  COMPREHENSIVE METABOLIC PANEL - Abnormal; Notable for the following:       Result Value   Chloride 99 (*)    Glucose, Bld 317 (*)    All other components within normal limits  URINALYSIS, COMPLETE (UACMP) WITH MICROSCOPIC - Abnormal; Notable for the following:    Color, Urine YELLOW (*)    APPearance CLEAR (*)    Specific Gravity, Urine 1.044 (*)    Glucose, UA >=500 (*)    Hgb urine dipstick SMALL (*)    Protein, ur 30 (*)    Bacteria, UA RARE (*)    Squamous Epithelial / LPF 0-5 (*)    All other components within normal limits  URINE CULTURE  LIPASE, BLOOD  CBC  TROPONIN I  GLUCOSE,  CAPILLARY  CBG MONITORING, ED   ____________________________________________  EKG  ED ECG REPORT I, Nita Sickle, the attending physician, personally viewed and interpreted this ECG.  Normal sinus rhythm, rate of 87, normal intervals, left axis deviation, no ST elevations or depressions, flattening T waves in I and aVL. Unchanged from prior from 2017  ____________________________________________  RADIOLOGY  none ____________________________________________   PROCEDURES  Procedure(s) performed: None Procedures Critical Care performed:  None ____________________________________________   INITIAL IMPRESSION / ASSESSMENT AND PLAN / ED COURSE  51 y.o. male with a history of CVA on Plavix, diabetes, hypertension who presents for evaluation of vomiting x 3 days. Patient is well-appearing, in no distress, vital signs show low-grade temp of 99.98F but otherwise in no acute findings. No evidence of SBO with abdomen soft with no tenderness throughout, no distention, no prior abdominal surgeries, passing flatus and normal  BMs. UA with evidence of UTI and since patient has a low-grade temperature we'll treat with IV ceftriaxone. CBC with no acute findings. CMP showing hyperglycemia with no evidence of DKA. Lipase is normal. We'll check EKG and troponin to rule out silent MIs since patient is diabetic. We'll give IV fluids and IV Zofran.  Clinical Course as of Jun 06 1802  Sat Jun 05, 2016  1803 Patient is tolerating PO. NO longer vomiting. BG trending down. Will dc home on keflex for UTI and zofran. Recommended close f/u with PCP Monday.  [CV]    Clinical Course User Index [CV] Nita Sickle, MD    Pertinent labs & imaging results that were available during my care of the patient were reviewed by me and considered in my medical decision making (see chart for details).    ____________________________________________   FINAL CLINICAL IMPRESSION(S) / ED DIAGNOSES  Final  diagnoses:  Non-intractable vomiting with nausea, unspecified vomiting type  Type 2 diabetes mellitus with hyperglycemia, with long-term current use of insulin (HCC)  Acute cystitis with hematuria      NEW MEDICATIONS STARTED DURING THIS VISIT:  New Prescriptions   CEPHALEXIN (KEFLEX) 500 MG CAPSULE    Take 1 capsule (500 mg total) by mouth 4 (four) times daily.   ONDANSETRON (ZOFRAN) 4 MG TABLET    Take 1 tablet (4 mg total) by mouth every 8 (eight) hours as needed for nausea or vomiting.     Note:  This document was prepared using Dragon voice recognition software and may include unintentional dictation errors.    Nita Sickle, MD 06/05/16 [id number]

## 2016-06-05 NOTE — ED Notes (Signed)
Per Dr. Don Perking, OK for pt to drink.  Pt given Sprite to drink

## 2016-06-05 NOTE — ED Notes (Signed)
Pt sleeping. 

## 2016-06-05 NOTE — ED Notes (Signed)
Pt wondering around waiting area and outside, no vomiting noted. Pt in no distress.

## 2016-06-05 NOTE — ED Triage Notes (Signed)
Pt presents to ED c/o emesis x3 days. Denies diarrhea and abdominal pain. x2 episodes in last 24hours, denies hematemesis. LBM yesterday.

## 2016-06-07 LAB — URINE CULTURE

## 2016-06-21 ENCOUNTER — Ambulatory Visit
Admission: RE | Admit: 2016-06-21 | Discharge: 2016-06-21 | Disposition: A | Discharge status: Home / Self Care | Payer: Disability Insurance | Source: Ambulatory Visit | Attending: Orthopedic Surgery | Admitting: Orthopedic Surgery

## 2016-06-21 ENCOUNTER — Other Ambulatory Visit: Payer: Self-pay | Admitting: Orthopedic Surgery

## 2016-06-21 DIAGNOSIS — M25561 Pain in right knee: Secondary | ICD-10-CM | POA: Diagnosis present

## 2016-06-21 DIAGNOSIS — M545 Low back pain: Secondary | ICD-10-CM | POA: Diagnosis present

## 2016-06-21 DIAGNOSIS — M25562 Pain in left knee: Secondary | ICD-10-CM

## 2016-06-21 DIAGNOSIS — M1711 Unilateral primary osteoarthritis, right knee: Secondary | ICD-10-CM | POA: Diagnosis not present

## 2016-06-21 DIAGNOSIS — M5136 Other intervertebral disc degeneration, lumbar region: Secondary | ICD-10-CM | POA: Insufficient documentation

## 2016-07-19 ENCOUNTER — Emergency Department
Admission: EM | Admit: 2016-07-19 | Discharge: 2016-07-19 | Disposition: A | Discharge status: Home / Self Care | Payer: Disability Insurance | Attending: Emergency Medicine | Admitting: Emergency Medicine

## 2016-07-19 DIAGNOSIS — F250 Schizoaffective disorder, bipolar type: Secondary | ICD-10-CM

## 2016-07-19 DIAGNOSIS — Z7982 Long term (current) use of aspirin: Secondary | ICD-10-CM | POA: Insufficient documentation

## 2016-07-19 DIAGNOSIS — Z7984 Long term (current) use of oral hypoglycemic drugs: Secondary | ICD-10-CM | POA: Insufficient documentation

## 2016-07-19 DIAGNOSIS — Z7902 Long term (current) use of antithrombotics/antiplatelets: Secondary | ICD-10-CM | POA: Insufficient documentation

## 2016-07-19 DIAGNOSIS — Z79899 Other long term (current) drug therapy: Secondary | ICD-10-CM | POA: Insufficient documentation

## 2016-07-19 DIAGNOSIS — R443 Hallucinations, unspecified: Secondary | ICD-10-CM

## 2016-07-19 DIAGNOSIS — F17210 Nicotine dependence, cigarettes, uncomplicated: Secondary | ICD-10-CM | POA: Insufficient documentation

## 2016-07-19 DIAGNOSIS — I10 Essential (primary) hypertension: Secondary | ICD-10-CM | POA: Insufficient documentation

## 2016-07-19 DIAGNOSIS — Z8673 Personal history of transient ischemic attack (TIA), and cerebral infarction without residual deficits: Secondary | ICD-10-CM | POA: Insufficient documentation

## 2016-07-19 DIAGNOSIS — E119 Type 2 diabetes mellitus without complications: Secondary | ICD-10-CM | POA: Insufficient documentation

## 2016-07-19 DIAGNOSIS — Z794 Long term (current) use of insulin: Secondary | ICD-10-CM | POA: Insufficient documentation

## 2016-07-19 HISTORY — DX: Schizoaffective disorder, bipolar type: F25.0

## 2016-07-19 HISTORY — DX: Schizoaffective disorder, unspecified: F25.9

## 2016-07-19 LAB — CBC WITH DIFFERENTIAL/PLATELET
BASOS ABS: 0 10*3/uL (ref 0–0.1)
BASOS PCT: 1 %
Eosinophils Absolute: 0.1 10*3/uL (ref 0–0.7)
Eosinophils Relative: 1 %
HEMATOCRIT: 41.2 % (ref 40.0–52.0)
HEMOGLOBIN: 14.3 g/dL (ref 13.0–18.0)
LYMPHS PCT: 38 %
Lymphs Abs: 2.4 10*3/uL (ref 1.0–3.6)
MCH: 31.4 pg (ref 26.0–34.0)
MCHC: 34.6 g/dL (ref 32.0–36.0)
MCV: 90.8 fL (ref 80.0–100.0)
Monocytes Absolute: 0.5 10*3/uL (ref 0.2–1.0)
Monocytes Relative: 8 %
NEUTROS PCT: 52 %
Neutro Abs: 3.2 10*3/uL (ref 1.4–6.5)
Platelets: 244 10*3/uL (ref 150–440)
RBC: 4.54 MIL/uL (ref 4.40–5.90)
RDW: 13.1 % (ref 11.5–14.5)
WBC: 6.2 10*3/uL (ref 3.8–10.6)

## 2016-07-19 LAB — COMPREHENSIVE METABOLIC PANEL
ALBUMIN: 3.8 g/dL (ref 3.5–5.0)
ALT: 13 U/L — AB (ref 17–63)
AST: 16 U/L (ref 15–41)
Alkaline Phosphatase: 103 U/L (ref 38–126)
Anion gap: 9 (ref 5–15)
BILIRUBIN TOTAL: 0.5 mg/dL (ref 0.3–1.2)
BUN: 14 mg/dL (ref 6–20)
CO2: 25 mmol/L (ref 22–32)
CREATININE: 0.91 mg/dL (ref 0.61–1.24)
Calcium: 9.3 mg/dL (ref 8.9–10.3)
Chloride: 100 mmol/L — ABNORMAL LOW (ref 101–111)
GFR calc Af Amer: >60 mL/min (ref >60)
GFR calc non Af Amer: >60 mL/min (ref >60)
GLUCOSE: 313 mg/dL — AB (ref 65–99)
POTASSIUM: 3.5 mmol/L (ref 3.5–5.1)
Sodium: 134 mmol/L — ABNORMAL LOW (ref 135–145)
TOTAL PROTEIN: 7 g/dL (ref 6.5–8.1)

## 2016-07-19 LAB — GLUCOSE, CAPILLARY: Glucose-Capillary: 234 mg/dL — ABNORMAL HIGH (ref 65–99)

## 2016-07-19 LAB — URINE DRUG SCREEN, QUALITATIVE (ARMC ONLY)
Amphetamines, Ur Screen: NOT DETECTED
Barbiturates, Ur Screen: NOT DETECTED
Benzodiazepine, Ur Scrn: NOT DETECTED
CANNABINOID 50 NG, UR ~~LOC~~: NOT DETECTED
COCAINE METABOLITE, UR ~~LOC~~: NOT DETECTED
MDMA (ECSTASY) UR SCREEN: NOT DETECTED
METHADONE SCREEN, URINE: NOT DETECTED
OPIATE, UR SCREEN: NOT DETECTED
Phencyclidine (PCP) Ur S: NOT DETECTED
TRICYCLIC, UR SCREEN: NOT DETECTED

## 2016-07-19 LAB — TSH: TSH: 1.365 u[IU]/mL (ref 0.350–4.500)

## 2016-07-19 LAB — ETHANOL: Alcohol, Ethyl (B): <5 mg/dL (ref <5)

## 2016-07-19 LAB — SALICYLATE LEVEL: Salicylate Lvl: <7 mg/dL (ref 2.8–30.0)

## 2016-07-19 LAB — ACETAMINOPHEN LEVEL: Acetaminophen (Tylenol), Serum: <10 ug/mL — ABNORMAL LOW (ref 10–30)

## 2016-07-19 MED ORDER — QUETIAPINE FUMARATE 100 MG PO TABS: 100.0000 mg | ORAL_TABLET | Freq: Every day | ORAL | 1 refills | Status: DC
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
## 2016-07-19 NOTE — ED Triage Notes (Signed)
Pt brought in via ems for c/o visual and auditory hallucinations - pt is aware that the people he is seeing and talking to are not there and that they have been dead for years - pt reports he is more confused today than usual - CBG 313

## 2016-07-19 NOTE — Discharge Instructions (Signed)
You have been seen in the Emergency Department (ED)  today for a psychiatric complaint.  You have been evaluated by psychiatry and we believe you are safe to be discharged from the hospital.   ° °Please return to the Emergency Department (ED)  immediately if you have ANY thoughts of hurting yourself or anyone else, so that we may help you. ° °Please avoid alcohol and drug use. ° °Follow up with your doctor and/or therapist as soon as possible regarding today's ED  visit.  ° °You may call crisis hotline for [place] at [phone]. ° °

## 2016-07-19 NOTE — ED Notes (Signed)
Pt does not take BP meds

## 2016-07-19 NOTE — ED Notes (Signed)
The TTS provided [name] with resource information for outpatient services in the community for uninsured individuals.  The TTS provided information for [facility] and [facility].  The patient was given the opportunity to ask questions regarding the location of the services and understood that it is his responsibility to contact the agencies for services.

## 2016-07-19 NOTE — Consult Note (Signed)
[facility] Consult   Reason for Consult:  Consult for 51 year old man with a history of past mental health issues who was complaining of recent worsening of hallucinations and mood Referring Physician:  Eual Fines Patient Identification: Henry Munoz MRN:  [id number] Principal Diagnosis: Schizo-affective schizophrenia [facility]) Diagnosis:   Patient Active Problem List   Diagnosis Date Noted  . Schizo-affective schizophrenia ([place]) [F25.0] 07/19/2016  . Type 2 diabetes mellitus with other circulatory complications (CODE) [X32.35] 04/27/2016  . Essential hypertension [I10] 02/19/2016  . Stroke (cerebrum) ([place]) [I63.9] 01/30/2016    Total Time spent with patient: 1 hour  Subjective:   Henry Munoz is a 51 y.o. male patient admitted with "I'm not thinking straight".  HPI:  Patient interviewed chart reviewed. This is a 51 year old man who was seen in the emergency room for consult about recent worsening of hallucinations and confusion. Patient says that for about the past month may be a little bit more he feels like his thinking has been worse. He feels confused more the time. His mood is a little bit more down. The day before yesterday he had a visual hallucination of his deceased grandfather at the kitchen table and actually had a long conversation with him. Patient is sleeping poorly at night. He does not currently get any psychiatric medicine. He denies any suicidal or homicidal ideation. Not displaying any violent behavior. He has an awareness that something is not right. Doesn't have much social activity or anything doctor by his mind.  Social history: Lives with his parents. Says that he last worked about a year ago hasn't been able to go back since then. Sounds like his work career has been a bit sporadic over the years. Her [employer]  Medical history: Diabetes overweight had a stroke back in December with significant loss of function on his left side blood pressure is well.  Questionable medication compliance.  Substance abuse history: Patient denies any alcohol or drug abuse currently or any past problems with alcohol or drug abuse  Past Psychiatric History: Patient says that he is had psychiatric problems for years. He hasn't been seeing a psychiatrist or been taking any medication in at least 3 years however. That probably predated his move to [place]. He remembers having been on Seroquel lithium Sinequan and other psychiatric medicine in the past. He denies ever being in a psychiatric hospital however and denies any history of suicide attempts or violence. Diagnosis of schizoaffective disorder is on the chart.  Risk to Self: Is patient at risk for suicide?: No Risk to Others:   Prior Inpatient Therapy:   Prior Outpatient Therapy:    Past Medical History:  Past Medical History:  Diagnosis Date  . Diabetes mellitus without complication ([place])   . Hypertension   . Schizo-affective schizophrenia ([place])    History reviewed. No pertinent surgical history. Family History: No family history on file. Family Psychiatric  History: He says he has a brother who also has chronic hallucinations Social History:  History  Alcohol Use  . 1.2 oz/week  . 2 Cans of beer per week    Comment: per week     History  Drug Use No    Social History   Social History  . Marital status: Single    Spouse name: N/A  . Number of children: N/A  . Years of education: N/A   Social History Main Topics  . Smoking status: Current Every Day Smoker    Packs/day: 0.50    Types:  Cigarettes  . Smokeless tobacco: Never Used  . Alcohol use 1.2 oz/week    2 Cans of beer per week     Comment: per week  . Drug use: No  . Sexual activity: Not Asked   Other Topics Concern  . None   Social History Narrative  . None   Additional Social History:    Allergies:   Allergies  Allergen Reactions  . Haldol [Haloperidol Lactate]     Labs:  Results for orders placed or  performed during the hospital encounter of 07/19/16 (from the past 48 hour(s))  CBC with Differential/Platelet     Status: None   Collection Time: 07/19/16  4:35 PM  Result Value Ref Range   WBC 6.2 3.8 - 10.6 K/uL   RBC 4.54 4.40 - 5.90 MIL/uL   Hemoglobin 14.3 13.0 - 18.0 g/dL   HCT 41.2 40.0 - 52.0 %   MCV 90.8 80.0 - 100.0 fL   MCH 31.4 26.0 - 34.0 pg   MCHC 34.6 32.0 - 36.0 g/dL   RDW 13.1 11.5 - 14.5 %   Platelets 244 150 - 440 K/uL   Neutrophils Relative % 52 %   Neutro Abs 3.2 1.4 - 6.5 K/uL   Lymphocytes Relative 38 %   Lymphs Abs 2.4 1.0 - 3.6 K/uL   Monocytes Relative 8 %   Monocytes Absolute 0.5 0.2 - 1.0 K/uL   Eosinophils Relative 1 %   Eosinophils Absolute 0.1 0 - 0.7 K/uL   Basophils Relative 1 %   Basophils Absolute 0.0 0 - 0.1 K/uL  Comprehensive metabolic panel     Status: Abnormal   Collection Time: 07/19/16  4:35 PM  Result Value Ref Range   Sodium 134 (L) 135 - 145 mmol/L   Potassium 3.5 3.5 - 5.1 mmol/L   Chloride 100 (L) 101 - 111 mmol/L   CO2 25 22 - 32 mmol/L   Glucose, Bld 313 (H) 65 - 99 mg/dL   BUN 14 6 - 20 mg/dL   Creatinine, Ser 0.91 0.61 - 1.24 mg/dL   Calcium 9.3 8.9 - 10.3 mg/dL   Total Protein 7.0 6.5 - 8.1 g/dL   Albumin 3.8 3.5 - 5.0 g/dL   AST 16 15 - 41 U/L   ALT 13 (L) 17 - 63 U/L   Alkaline Phosphatase 103 38 - 126 U/L   Total Bilirubin 0.5 0.3 - 1.2 mg/dL   GFR calc non Af Amer >60 >60 mL/min   GFR calc Af Amer >60 >60 mL/min    Comment: (NOTE) The eGFR has been calculated using the CKD EPI equation. This calculation has not been validated in all clinical situations. eGFR's persistently <60 mL/min signify possible Chronic Kidney Disease.    Anion gap 9 5 - 15  Ethanol     Status: None   Collection Time: 07/19/16  4:35 PM  Result Value Ref Range   Alcohol, Ethyl (B) <5 <5 mg/dL    Comment:        LOWEST DETECTABLE LIMIT FOR SERUM ALCOHOL IS 5 mg/dL FOR MEDICAL PURPOSES ONLY   Salicylate level     Status: None    Collection Time: 07/19/16  4:35 PM  Result Value Ref Range   Salicylate Lvl <2.8 2.8 - 30.0 mg/dL  Acetaminophen level     Status: Abnormal   Collection Time: 07/19/16  4:35 PM  Result Value Ref Range   Acetaminophen (Tylenol), Serum <10 (L) 10 - 30 ug/mL    Comment:  THERAPEUTIC CONCENTRATIONS VARY SIGNIFICANTLY. A RANGE OF 10-30 ug/mL MAY BE AN EFFECTIVE CONCENTRATION FOR MANY PATIENTS. HOWEVER, SOME ARE BEST TREATED AT CONCENTRATIONS OUTSIDE THIS RANGE. ACETAMINOPHEN CONCENTRATIONS >150 ug/mL AT 4 HOURS AFTER INGESTION AND >50 ug/mL AT 12 HOURS AFTER INGESTION ARE OFTEN ASSOCIATED WITH TOXIC REACTIONS.   TSH     Status: None   Collection Time: 07/19/16  4:35 PM  Result Value Ref Range   TSH 1.365 0.350 - 4.500 uIU/mL    Comment: Performed by a 3rd Generation assay with a functional sensitivity of <=0.01 uIU/mL.  Urine Drug Screen, Qualitative ([facility] only)     Status: None   Collection Time: 07/19/16  4:37 PM  Result Value Ref Range   Tricyclic, Ur Screen NONE DETECTED NONE DETECTED   Amphetamines, Ur Screen NONE DETECTED NONE DETECTED   MDMA (Ecstasy)Ur Screen NONE DETECTED NONE DETECTED   Cocaine Metabolite,Ur [place] NONE DETECTED NONE DETECTED   Opiate, Ur Screen NONE DETECTED NONE DETECTED   Phencyclidine (PCP) Ur S NONE DETECTED NONE DETECTED   Cannabinoid 50 Ng, Ur [place] NONE DETECTED NONE DETECTED   Barbiturates, Ur Screen NONE DETECTED NONE DETECTED   Benzodiazepine, Ur Scrn NONE DETECTED NONE DETECTED   Methadone Scn, Ur NONE DETECTED NONE DETECTED    Comment: (NOTE) 295  Tricyclics, urine               Cutoff 1000 ng/mL 200  Amphetamines, urine             Cutoff 1000 ng/mL 300  MDMA (Ecstasy), urine           Cutoff 500 ng/mL 400  Cocaine Metabolite, urine       Cutoff 300 ng/mL 500  Opiate, urine                   Cutoff 300 ng/mL 600  Phencyclidine (PCP), urine      Cutoff 25 ng/mL 700  Cannabinoid, urine              Cutoff 50 ng/mL 800  Barbiturates,  urine             Cutoff 200 ng/mL 900  Benzodiazepine, urine           Cutoff 200 ng/mL 1000 Methadone, urine                Cutoff 300 ng/mL 1100 1200 The urine drug screen provides only a preliminary, unconfirmed 1300 analytical test result and should not be used for non-medical 1400 purposes. Clinical consideration and professional judgment should 1500 be applied to any positive drug screen result due to possible 1600 interfering substances. A more specific alternate chemical method 1700 must be used in order to obtain a confirmed analytical result.  1800 Gas chromato graphy / mass spectrometry (GC/MS) is the preferred 1900 confirmatory method.   Glucose, capillary     Status: Abnormal   Collection Time: 07/19/16  8:00 PM  Result Value Ref Range   Glucose-Capillary 234 (H) 65 - 99 mg/dL    No current facility-administered medications for this encounter.    Current Outpatient Prescriptions  Medication Sig Dispense Refill  . amLODipine (NORVASC) 10 MG tablet Take 1 tablet (10 mg total) by mouth daily. 30 tablet 0  . aspirin 81 MG EC tablet Take 1 tablet (81 mg total) by mouth daily. 120 tablet 0  . atorvastatin (LIPITOR) 40 MG tablet Take 1 tablet (40 mg total) by mouth daily. 30 tablet 0  . clopidogrel (  PLAVIX) 75 MG tablet Take 1 tablet (75 mg total) by mouth daily. 30 tablet 0  . hydrochlorothiazide (HYDRODIURIL) 50 MG tablet Take 1 tablet (50 mg total) by mouth daily. 30 tablet 0  . insulin NPH-regular Human (NOVOLIN 70/30) (70-30) 100 UNIT/ML injection Inject 10 Units into the skin 2 (two) times daily with a meal. 10 mL 0  . metFORMIN (GLUMETZA) 1000 MG (MOD) 24 hr tablet Take 1 tablet (1,000 mg total) by mouth 2 (two) times daily with a meal. 180 tablet 3  . ondansetron (ZOFRAN) 4 MG tablet Take 1 tablet (4 mg total) by mouth every 8 (eight) hours as needed for nausea or vomiting. 20 tablet 0  . QUEtiapine (SEROQUEL) 100 MG tablet Take 1 tablet (100 mg total) by mouth at  bedtime. 30 tablet 1    Musculoskeletal: Strength & Muscle Tone: abnormal Gait & Station: ataxic Patient leans: Left  Psychiatric Specialty Exam: Physical Exam  Nursing note and vitals reviewed. Constitutional: He appears well-developed and well-nourished.  HENT:  Head: Normocephalic and atraumatic.  Eyes: Conjunctivae are normal. Pupils are equal, round, and reactive to light.  Neck: Normal range of motion.  Cardiovascular: Normal heart sounds.   Respiratory: Effort normal.  GI: Soft.  Musculoskeletal: Normal range of motion.  Neurological: He is alert.  Patient has decreased strength on his left side especially the left upper extremity  Skin: Skin is warm and dry.    Review of Systems  Constitutional: Negative.   HENT: Negative.   Eyes: Negative.   Respiratory: Negative.   Cardiovascular: Negative.   Gastrointestinal: Negative.   Musculoskeletal: Negative.   Skin: Negative.   Neurological: Positive for focal weakness.  Psychiatric/Behavioral: Positive for depression, hallucinations and memory loss. Negative for substance abuse and suicidal ideas. The patient is nervous/anxious and has insomnia.     Blood pressure (!) 188/122, pulse 67, temperature 98.7 F (37.1 C), temperature source Oral, resp. rate 15, height _0  (1.702 m), weight 106.6 kg (235 lb), SpO2 100 %.Body mass index is 36.81 kg/m.  General Appearance: Disheveled  Eye Contact:  Fair  Speech:  Slow  Volume:  Decreased  Mood:  Dysphoric  Affect:  Constricted  Thought Process:  Disorganized  Orientation:  Full (Time, Place, and Person)  Thought Content:  Hallucinations: Auditory Visual  Suicidal Thoughts:  No  Homicidal Thoughts:  No  Memory:  Immediate;   Fair Recent;   Poor Remote;   Fair  Judgement:  Fair  Insight:  Fair  Psychomotor Activity:  Decreased  Concentration:  Concentration: Fair  Recall:  [employer] of Knowledge:  Fair  Language:  Fair  Akathisia:  No  Handed:  Right  AIMS (if  indicated):     Assets:  Desire for Improvement Housing Social Support  ADL's:  Intact  Cognition:  Impaired,  Mild  Sleep:        Treatment Plan Summary: Daily contact with patient to assess and evaluate symptoms and progress in treatment, Medication management and Plan 51 year old man with a past history of psychiatric problems is presenting with what sounds a Combination of psychotic symptoms and some degree of depression. He denies any suicidal or homicidal thoughts and has not shown any dangerous behavior. Patient does not meet commitment criteria and does not require inpatient treatment however would benefit from psychiatric follow-up. Reviewing his symptoms I suggested we restart Seroquel 100 mg at night. Prescription written and will be provided. I have requested TTS to see the patient and make  sure he has information about local mental health providers. Case reviewed with emergency room physician.  Disposition: Patient does not meet criteria for psychiatric inpatient admission. Supportive therapy provided about ongoing stressors.  Alethia Berthold, MD 07/19/2016 8:21 PM

## 2016-08-09 ENCOUNTER — Encounter: Payer: Self-pay | Admitting: Emergency Medicine

## 2016-08-09 ENCOUNTER — Emergency Department
Admission: EM | Admit: 2016-08-09 | Discharge: 2016-08-09 | Disposition: A | Discharge status: Other Acute Inpatient Hospital | Payer: Disability Insurance | Attending: Emergency Medicine | Admitting: Emergency Medicine

## 2016-08-09 ENCOUNTER — Inpatient Hospital Stay
Admission: EM | Admit: 2016-08-09 | Discharge: 2016-08-13 | DRG: 881 | Disposition: A | Discharge status: Home / Self Care | Payer: No Typology Code available for payment source | Source: Intra-hospital | Attending: Psychiatry | Admitting: Psychiatry

## 2016-08-09 DIAGNOSIS — I69318 Other symptoms and signs involving cognitive functions following cerebral infarction: Secondary | ICD-10-CM

## 2016-08-09 DIAGNOSIS — F1020 Alcohol dependence, uncomplicated: Secondary | ICD-10-CM

## 2016-08-09 DIAGNOSIS — F329 Major depressive disorder, single episode, unspecified: Secondary | ICD-10-CM | POA: Diagnosis present

## 2016-08-09 DIAGNOSIS — Z794 Long term (current) use of insulin: Secondary | ICD-10-CM | POA: Insufficient documentation

## 2016-08-09 DIAGNOSIS — Z7902 Long term (current) use of antithrombotics/antiplatelets: Secondary | ICD-10-CM | POA: Insufficient documentation

## 2016-08-09 DIAGNOSIS — F251 Schizoaffective disorder, depressive type: Secondary | ICD-10-CM | POA: Diagnosis present

## 2016-08-09 DIAGNOSIS — E1165 Type 2 diabetes mellitus with hyperglycemia: Secondary | ICD-10-CM | POA: Diagnosis present

## 2016-08-09 DIAGNOSIS — F17200 Nicotine dependence, unspecified, uncomplicated: Secondary | ICD-10-CM

## 2016-08-09 DIAGNOSIS — I10 Essential (primary) hypertension: Secondary | ICD-10-CM | POA: Diagnosis present

## 2016-08-09 DIAGNOSIS — I639 Cerebral infarction, unspecified: Secondary | ICD-10-CM

## 2016-08-09 DIAGNOSIS — Z7982 Long term (current) use of aspirin: Secondary | ICD-10-CM

## 2016-08-09 DIAGNOSIS — R443 Hallucinations, unspecified: Secondary | ICD-10-CM

## 2016-08-09 DIAGNOSIS — F14151 Cocaine abuse with cocaine-induced psychotic disorder with hallucinations: Secondary | ICD-10-CM | POA: Insufficient documentation

## 2016-08-09 DIAGNOSIS — F17210 Nicotine dependence, cigarettes, uncomplicated: Secondary | ICD-10-CM | POA: Insufficient documentation

## 2016-08-09 DIAGNOSIS — F1420 Cocaine dependence, uncomplicated: Secondary | ICD-10-CM | POA: Diagnosis present

## 2016-08-09 DIAGNOSIS — F250 Schizoaffective disorder, bipolar type: Secondary | ICD-10-CM

## 2016-08-09 DIAGNOSIS — Z79899 Other long term (current) drug therapy: Secondary | ICD-10-CM

## 2016-08-09 DIAGNOSIS — E119 Type 2 diabetes mellitus without complications: Secondary | ICD-10-CM | POA: Insufficient documentation

## 2016-08-09 DIAGNOSIS — G3184 Mild cognitive impairment, so stated: Secondary | ICD-10-CM

## 2016-08-09 DIAGNOSIS — F259 Schizoaffective disorder, unspecified: Secondary | ICD-10-CM | POA: Insufficient documentation

## 2016-08-09 DIAGNOSIS — Z7984 Long term (current) use of oral hypoglycemic drugs: Secondary | ICD-10-CM | POA: Insufficient documentation

## 2016-08-09 DIAGNOSIS — F1410 Cocaine abuse, uncomplicated: Secondary | ICD-10-CM

## 2016-08-09 LAB — GLUCOSE, CAPILLARY
Glucose-Capillary: 335 mg/dL — ABNORMAL HIGH (ref 65–99)
Glucose-Capillary: 91 mg/dL (ref 65–99)

## 2016-08-09 LAB — CBC
HEMATOCRIT: 41.4 % (ref 40.0–52.0)
HEMOGLOBIN: 14.3 g/dL (ref 13.0–18.0)
MCH: 30.9 pg (ref 26.0–34.0)
MCHC: 34.5 g/dL (ref 32.0–36.0)
MCV: 89.7 fL (ref 80.0–100.0)
PLATELETS: 238 10*3/uL (ref 150–440)
RBC: 4.62 MIL/uL (ref 4.40–5.90)
RDW: 13.1 % (ref 11.5–14.5)
WBC: 7.7 10*3/uL (ref 3.8–10.6)

## 2016-08-09 LAB — URINE DRUG SCREEN, QUALITATIVE (ARMC ONLY)
AMPHETAMINES, UR SCREEN: NOT DETECTED
Barbiturates, Ur Screen: NOT DETECTED
Benzodiazepine, Ur Scrn: NOT DETECTED
COCAINE METABOLITE, UR ~~LOC~~: POSITIVE — AB
Cannabinoid 50 Ng, Ur ~~LOC~~: NOT DETECTED
MDMA (ECSTASY) UR SCREEN: NOT DETECTED
Methadone Scn, Ur: NOT DETECTED
OPIATE, UR SCREEN: NOT DETECTED
PHENCYCLIDINE (PCP) UR S: NOT DETECTED
Tricyclic, Ur Screen: NOT DETECTED

## 2016-08-09 LAB — COMPREHENSIVE METABOLIC PANEL
ALBUMIN: 4.3 g/dL (ref 3.5–5.0)
ALT: 14 U/L — AB (ref 17–63)
AST: 17 U/L (ref 15–41)
Alkaline Phosphatase: 103 U/L (ref 38–126)
Anion gap: 9 (ref 5–15)
BUN: 15 mg/dL (ref 6–20)
CHLORIDE: 102 mmol/L (ref 101–111)
CO2: 25 mmol/L (ref 22–32)
CREATININE: 0.92 mg/dL (ref 0.61–1.24)
Calcium: 9.5 mg/dL (ref 8.9–10.3)
GFR calc Af Amer: >60 mL/min (ref >60)
GFR calc non Af Amer: >60 mL/min (ref >60)
GLUCOSE: 259 mg/dL — AB (ref 65–99)
POTASSIUM: 3.3 mmol/L — AB (ref 3.5–5.1)
Sodium: 136 mmol/L (ref 135–145)
Total Bilirubin: 0.7 mg/dL (ref 0.3–1.2)
Total Protein: 7.8 g/dL (ref 6.5–8.1)

## 2016-08-09 LAB — ACETAMINOPHEN LEVEL: Acetaminophen (Tylenol), Serum: <10 ug/mL — ABNORMAL LOW (ref 10–30)

## 2016-08-09 LAB — SALICYLATE LEVEL: Salicylate Lvl: <7 mg/dL (ref 2.8–30.0)

## 2016-08-09 LAB — ETHANOL: Alcohol, Ethyl (B): <5 mg/dL (ref <5)

## 2016-08-09 MED ORDER — CLOPIDOGREL BISULFATE 75 MG PO TABS
75.0000 mg | ORAL_TABLET | Freq: Every day | ORAL | Status: DC
  Administered 2016-08-09: 75 mg via ORAL
  Filled 2016-08-09: qty 1

## 2016-08-09 MED ORDER — INSULIN ASPART PROT & ASPART (70-30 MIX) 100 UNIT/ML ~~LOC~~ SUSP
10.0000 [IU] | Freq: Two times a day (BID) | SUBCUTANEOUS | Status: DC
  Filled 2016-08-09: qty 10

## 2016-08-09 MED ORDER — INSULIN ASPART 100 UNIT/ML ~~LOC~~ SOLN
0.0000 [IU] | Freq: Three times a day (TID) | SUBCUTANEOUS | Status: DC
  Administered 2016-08-09: 11 [IU] via SUBCUTANEOUS
  Filled 2016-08-09: qty 1

## 2016-08-09 MED ORDER — ALUM & MAG HYDROXIDE-SIMETH 200-200-20 MG/5ML PO SUSP: 30.0000 mL | ORAL | Status: DC | PRN

## 2016-08-09 MED ORDER — METFORMIN HCL ER 500 MG PO TB24
1000.0000 mg | ORAL_TABLET | Freq: Two times a day (BID) | ORAL | Status: DC
  Administered 2016-08-09 (×2): 1000 mg via ORAL
  Filled 2016-08-09 (×3): qty 2

## 2016-08-09 MED ORDER — INSULIN ASPART 100 UNIT/ML ~~LOC~~ SOLN
0.0000 [IU] | Freq: Three times a day (TID) | SUBCUTANEOUS | Status: DC
  Administered 2016-08-10: 5 [IU] via SUBCUTANEOUS

## 2016-08-09 MED ORDER — ONDANSETRON HCL 4 MG PO TABS: 4.0000 mg | ORAL_TABLET | Freq: Three times a day (TID) | ORAL | Status: DC | PRN

## 2016-08-09 MED ORDER — HYDROCHLOROTHIAZIDE 25 MG PO TABS
50.0000 mg | ORAL_TABLET | Freq: Every day | ORAL | Status: DC
  Administered 2016-08-09: 50 mg via ORAL
  Filled 2016-08-09: qty 2

## 2016-08-09 MED ORDER — QUETIAPINE FUMARATE 200 MG PO TABS: 100.0000 mg | ORAL_TABLET | Freq: Every day | ORAL | Status: DC

## 2016-08-09 MED ORDER — INSULIN ASPART PROT & ASPART (70-30 MIX) 100 UNIT/ML ~~LOC~~ SUSP
10.0000 [IU] | Freq: Two times a day (BID) | SUBCUTANEOUS | Status: DC
  Administered 2016-08-09 (×2): 10 [IU] via SUBCUTANEOUS
  Filled 2016-08-09 (×2): qty 10

## 2016-08-09 MED ORDER — CLOPIDOGREL BISULFATE 75 MG PO TABS
75.0000 mg | ORAL_TABLET | Freq: Every day | ORAL | Status: DC
  Administered 2016-08-10 – 2016-08-13 (×4): 75 mg via ORAL
  Filled 2016-08-09 (×4): qty 1

## 2016-08-09 MED ORDER — HYDROCHLOROTHIAZIDE 25 MG PO TABS
50.0000 mg | ORAL_TABLET | Freq: Every day | ORAL | Status: DC
  Administered 2016-08-10 – 2016-08-13 (×4): 50 mg via ORAL
  Filled 2016-08-09 (×4): qty 2

## 2016-08-09 MED ORDER — QUETIAPINE FUMARATE 300 MG PO TABS: 300.0000 mg | ORAL_TABLET | Freq: Every day | ORAL | Status: DC

## 2016-08-09 MED ORDER — METFORMIN HCL ER 500 MG PO TB24
1000.0000 mg | ORAL_TABLET | Freq: Two times a day (BID) | ORAL | Status: DC
  Administered 2016-08-10 – 2016-08-12 (×6): 1000 mg via ORAL
  Filled 2016-08-09 (×8): qty 2

## 2016-08-09 MED ORDER — INSULIN ASPART PROT & ASPART (70-30 MIX) 100 UNIT/ML ~~LOC~~ SUSP
10.0000 [IU] | Freq: Two times a day (BID) | SUBCUTANEOUS | Status: DC
  Filled 2016-08-09 (×2): qty 10

## 2016-08-09 MED ORDER — ATORVASTATIN CALCIUM 20 MG PO TABS
40.0000 mg | ORAL_TABLET | Freq: Every day | ORAL | Status: DC
  Administered 2016-08-09: 40 mg via ORAL
  Filled 2016-08-09: qty 2

## 2016-08-09 MED ORDER — ASPIRIN EC 81 MG PO TBEC
81.0000 mg | DELAYED_RELEASE_TABLET | Freq: Every day | ORAL | Status: DC
  Administered 2016-08-09: 81 mg via ORAL
  Filled 2016-08-09: qty 1

## 2016-08-09 MED ORDER — QUETIAPINE FUMARATE 200 MG PO TABS
300.0000 mg | ORAL_TABLET | Freq: Every day | ORAL | Status: DC
  Administered 2016-08-09: 22:00:00 300 mg via ORAL
  Filled 2016-08-09: qty 1

## 2016-08-09 MED ORDER — MAGNESIUM HYDROXIDE 400 MG/5ML PO SUSP: 30.0000 mL | Freq: Every day | ORAL | Status: DC | PRN

## 2016-08-09 MED ORDER — AMLODIPINE BESYLATE 5 MG PO TABS
10.0000 mg | ORAL_TABLET | Freq: Every day | ORAL | Status: DC
  Administered 2016-08-10 – 2016-08-13 (×4): 10 mg via ORAL
  Filled 2016-08-09 (×4): qty 2

## 2016-08-09 MED ORDER — ATORVASTATIN CALCIUM 20 MG PO TABS
40.0000 mg | ORAL_TABLET | Freq: Every day | ORAL | Status: DC
  Administered 2016-08-10 – 2016-08-13 (×4): 40 mg via ORAL
  Filled 2016-08-09 (×4): qty 2

## 2016-08-09 MED ORDER — AMLODIPINE BESYLATE 5 MG PO TABS
10.0000 mg | ORAL_TABLET | Freq: Every day | ORAL | Status: DC
  Administered 2016-08-09: 10 mg via ORAL
  Filled 2016-08-09: qty 2

## 2016-08-09 MED ORDER — ONDANSETRON HCL 4 MG PO TABS
4.0000 mg | ORAL_TABLET | Freq: Three times a day (TID) | ORAL | Status: DC | PRN
  Filled 2016-08-09: qty 1

## 2016-08-09 MED ORDER — ACETAMINOPHEN 325 MG PO TABS: 650.0000 mg | ORAL_TABLET | Freq: Four times a day (QID) | ORAL | Status: DC | PRN

## 2016-08-09 MED ORDER — ASPIRIN EC 81 MG PO TBEC
81.0000 mg | DELAYED_RELEASE_TABLET | Freq: Every day | ORAL | Status: DC
  Administered 2016-08-10 – 2016-08-13 (×4): 81 mg via ORAL
  Filled 2016-08-09 (×4): qty 1

## 2016-08-09 NOTE — BH Assessment (Signed)
Pt recommended for inpatient admission by Dr.Clapacs. Pt's chart currently under review for [facility] BMU bed assignment.

## 2016-08-09 NOTE — ED Notes (Signed)
Patient is alert and oriented x 4.  Presents with flat affect and depressed mood.  Denies pain, SI, auditory and visual hallucinations.  Medications given as prescribed.  Routine safety checks continues every 15 minutes for safety.  Patient offered support and encouragement as needed.  Patient safe on the unit.  No symptoms of hypoglycemia noted.  Food and fluid intake adequate.

## 2016-08-09 NOTE — ED Notes (Signed)

## 2016-08-09 NOTE — ED Notes (Signed)
Pt unable to sign E signature due to being IVC

## 2016-08-09 NOTE — ED Notes (Signed)
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
## 2016-08-09 NOTE — BH Assessment (Signed)
Attempted to complete TTS consult and the patient repeatedly fell asleep while talking.  Will attempt to complete assessment when patient is more alert.
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
## 2016-08-09 NOTE — ED Notes (Signed)
Report called to Margaret, RN in the ED BHU.  
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
## 2016-08-09 NOTE — BH Assessment (Signed)
[facility] by Dr. Toni Amendlapacs.  Attending Physician will be Dr. Ardyth HarpsHernandez.   Patient [facility] ChandlerMandy.   ER staff is aware of the admission Misty Stanley( Lisa, ER Sect.; Dr. Lamont Snowballifenbark, ER MD; Marylu LundJanet, Patient's Nurse & Sharia ReeveJosh, Patient Access).  Pt may transport after 1700.

## 2016-08-09 NOTE — ED Notes (Signed)
Report was received from Dorise HissElizabeth C., RN; Pt. Verbalizes  complaints of having auditory and visual hallucinations; seeing snakes; and hearing voices; distress; also having S.I; with no plan; denies having Hi; also with cocaine use;Marland Kitchen. Continue to monitor with 15 min. Monitoring.

## 2016-08-09 NOTE — Tx Team (Signed)
Initial Treatment Plan 08/09/2016 6:59 PM Henry Munoz Romig [id number]    PATIENT STRESSORS: Financial difficulties Health problems Marital or family conflict Medication change or noncompliance Substance abuse   PATIENT STRENGTHS: General fund of knowledge Supportive family/friends   PATIENT IDENTIFIED PROBLEMS:   AH-"they don't be saying anything, just telling me to do things"    Cocaine Use    Family conflict            DISCHARGE CRITERIA:  Adequate post-discharge living arrangements Improved stabilization in mood, thinking, and/or behavior Safe-care adequate arrangements made Verbal commitment to aftercare and medication compliance Withdrawal symptoms are absent or subacute and managed without 24-hour nursing intervention  PRELIMINARY DISCHARGE PLAN: Attend aftercare/continuing care group Outpatient therapy Return to previous living arrangement  PATIENT/FAMILY INVOLVEMENT: This treatment plan has been presented to and reviewed with the patient, Henry Munoz Meldrum.  The patient and family have been given the opportunity to ask questions and make suggestions.  Tonye PearsonAmanda N Aitana Burry, RN 08/09/2016, 6:59 PM
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
## 2016-08-09 NOTE — ED Notes (Addendum)
Pt changed into red scrubs without incident, pt POC discussed, pt requesting food, pt UDS collected 1 bag of belonging collected

## 2016-08-10 ENCOUNTER — Encounter: Payer: Self-pay | Admitting: Psychiatry

## 2016-08-10 DIAGNOSIS — I639 Cerebral infarction, unspecified: Secondary | ICD-10-CM

## 2016-08-10 DIAGNOSIS — F1420 Cocaine dependence, uncomplicated: Secondary | ICD-10-CM

## 2016-08-10 DIAGNOSIS — F251 Schizoaffective disorder, depressive type: Secondary | ICD-10-CM

## 2016-08-10 DIAGNOSIS — F1020 Alcohol dependence, uncomplicated: Secondary | ICD-10-CM

## 2016-08-10 DIAGNOSIS — F17200 Nicotine dependence, unspecified, uncomplicated: Secondary | ICD-10-CM

## 2016-08-10 LAB — LIPID PANEL
CHOL/HDL RATIO: 7.6 ratio
Cholesterol: 205 mg/dL — ABNORMAL HIGH (ref 0–200)
HDL: 27 mg/dL — ABNORMAL LOW (ref >40)
LDL Cholesterol: 152 mg/dL — ABNORMAL HIGH (ref 0–99)
Triglycerides: 130 mg/dL (ref <150)
VLDL: 26 mg/dL (ref 0–40)

## 2016-08-10 LAB — GLUCOSE, CAPILLARY
GLUCOSE-CAPILLARY: 206 mg/dL — AB (ref 65–99)
GLUCOSE-CAPILLARY: 217 mg/dL — AB (ref 65–99)
GLUCOSE-CAPILLARY: 242 mg/dL — AB (ref 65–99)
Glucose-Capillary: 269 mg/dL — ABNORMAL HIGH (ref 65–99)

## 2016-08-10 LAB — HEMOGLOBIN A1C
HEMOGLOBIN A1C: 13.1 % — AB (ref 4.8–5.6)
MEAN PLASMA GLUCOSE: 329 mg/dL

## 2016-08-10 LAB — TSH: TSH: 0.976 u[IU]/mL (ref 0.350–4.500)

## 2016-08-10 MED ORDER — INSULIN ASPART PROT & ASPART (70-30 MIX) 100 UNIT/ML ~~LOC~~ SUSP
10.0000 [IU] | Freq: Two times a day (BID) | SUBCUTANEOUS | Status: DC
  Administered 2016-08-10 – 2016-08-12 (×5): 10 [IU] via SUBCUTANEOUS
  Filled 2016-08-10 (×4): qty 10

## 2016-08-10 MED ORDER — INSULIN ASPART 100 UNIT/ML ~~LOC~~ SOLN
0.0000 [IU] | Freq: Three times a day (TID) | SUBCUTANEOUS | Status: DC
  Administered 2016-08-10 (×2): 3 [IU] via SUBCUTANEOUS
  Administered 2016-08-11: 5 [IU] via SUBCUTANEOUS
  Administered 2016-08-11 (×2): 3 [IU] via SUBCUTANEOUS
  Administered 2016-08-12 – 2016-08-13 (×4): 2 [IU] via SUBCUTANEOUS
  Administered 2016-08-13: 5 [IU] via SUBCUTANEOUS
  Filled 2016-08-10 (×2): qty 1

## 2016-08-10 MED ORDER — INSULIN ASPART 100 UNIT/ML ~~LOC~~ SOLN
0.0000 [IU] | Freq: Every day | SUBCUTANEOUS | Status: DC
  Administered 2016-08-10: 3 [IU] via SUBCUTANEOUS
  Administered 2016-08-11 – 2016-08-12 (×2): 2 [IU] via SUBCUTANEOUS
  Filled 2016-08-10 (×3): qty 1

## 2016-08-10 MED ORDER — TRAZODONE HCL 50 MG PO TABS
150.0000 mg | ORAL_TABLET | Freq: Every day | ORAL | Status: DC
  Administered 2016-08-10 – 2016-08-12 (×3): 150 mg via ORAL
  Filled 2016-08-10 (×3): qty 1
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
## 2016-08-10 NOTE — Progress Notes (Signed)
Patient was visible on the unit at times sitting in dayroom, but stays to self or stays in room. Patient appears to be guarded and has minimal interaction with other, but is cooperative and complaint medications, meals, and snack. Patient denies SI/HI/AH/VH. Patient is alert and oriented x 4, breathing unlabored, and extremities x 4 within normal limits.  Patient did not display any disruptive behavior. Patient continues to be monitored on 15 minute safety checks. Will continue to monitor patient and notify MD of any changes.

## 2016-08-10 NOTE — Progress Notes (Signed)
Recreation Therapy Notes  Date: 07.03.18 Time: 9:30 am Location: Craft Room  Group Topic: Goal Setting  Goal Area(s) Addresses:  Patient will identify at least one goal. Patient will identify at least one obstacle.  Behavioral Response: Did not attend  Intervention: Recovery Goal Chart  Activity: Patients were instructed to make a Recovery Goal Chart including goals, obstacles, the date they started working on their goals, and the date they achieved their goals.  Education: LRT educated patients on healthy ways to celebrate achieving their goals.  Education Outcome: Patient did not attend group.   Clinical Observations/Feedback: Patient did not attend group.  Jontue Crumpacker M, LRT/CTRS 08/10/2016 10:25 AM 

## 2016-08-10 NOTE — [facility] Group Notes (Signed)
Goals Group  Date/Time: 08/10/2016, 9:00 AM Type of Therapy and Topic: Group Therapy: Goals Group: SMART Goals  ?  Participation Level: Did not attend  ?  Description of Group:  ?  The purpose of a daily goals group is to assist and guide patients in setting recovery/wellness-related goals. The objective is to set goals as they relate to the crisis in which they were admitted. Patients will be using SMART goal modalities to set measurable goals. Characteristics of realistic goals will be discussed and patients will be assisted in setting and processing how one will reach their goal. Facilitator will also assist patients in applying interventions and coping skills learned in psycho-education groups to the SMART goal and process how one will achieve defined goal.  ?   ?  Therapeutic Modalities:  Motivational Interviewing  Cognitive Behavioral Therapy  Crisis Intervention Model  SMART goals setting  Hampton AbbotKadijah Sarh Kirschenbaum, MSW, LCSW-A 08/10/2016, 11:40AM

## 2016-08-10 NOTE — [facility] Suicide Risk Assessment (Signed)
[facility]   Nursing information obtained from:  Patient Demographic factors:  Male, Unemployed Current Mental Status:  NA Loss Factors:  Decline in physical health, Financial problems / change in socioeconomic status, Legal issues Historical Factors:  Family history of mental illness or substance abuse Risk Reduction Factors:  Sense of responsibility to family, Living with another person, especially a relative  Total Time spent with patient: 1 hour Principal Problem: Schizoaffective disorder, depressive type (HCC) Diagnosis:   Patient Active Problem List   Diagnosis Date Noted  . Tobacco use disorder [F17.200] 08/10/2016  . Cocaine use disorder, severe, dependence (HCC) [F14.20] 08/10/2016  . Alcohol use disorder, moderate, dependence (HCC) [F10.20] 08/10/2016  . Schizoaffective disorder, depressive type (HCC) [F25.1] 08/09/2016  . Diabetes (HCC) [E11.9] 04/27/2016  . Essential hypertension [I10] 02/19/2016   Subjective Data:   Continued Clinical Symptoms:  Alcohol Use Disorder Identification Test Final Score (AUDIT): 4 The "Alcohol Use Disorders Identification Test", Guidelines for Use in Primary Care, Second Edition.  World Science writerHealth Organization [facility](WHO). Score between 0-7:  no or low risk or alcohol related problems. Score between 8-15:  moderate risk of alcohol related problems. Score between 16-19:  high risk of alcohol related problems. Score 20 or above:  warrants further diagnostic evaluation for alcohol dependence and treatment.   CLINICAL FACTORS:   Alcohol/Substance Abuse/Dependencies Previous Psychiatric Diagnoses and Treatments Medical Diagnoses and Treatments/Surgeries   Psychiatric Specialty Exam: Physical Exam  ROS  Blood pressure 135/89, pulse 66, temperature 98.2 F (36.8 C), resp. rate 18, height 5\' 7"  (1.702 m), weight 107.5 kg (237 lb), SpO2 100 %.Body mass index is 37.12 kg/m.                                                     Sleep:  Number of Hours: 6      COGNITIVE FEATURES THAT CONTRIBUTE TO RISK:  Closed-mindedness    SUICIDE RISK:   Moderate:  Frequent suicidal ideation with limited intensity, and duration, some specificity in terms of plans, no associated intent, good self-control, limited dysphoria/symptomatology, some risk factors present, and identifiable protective factors, including available and accessible social support.  [facility]  I certify that inpatient services furnished can reasonably be expected to improve the patient's condition.   Jimmy FootmanHernandez-Gonzalez,  Malone Vanblarcom, MD 08/10/2016, 11:42 AM
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
## 2016-08-10 NOTE — H&P (Signed)
Psychiatric Admission Assessment Adult  Patient Identification: Forestine ChuteFloyd Lee Formoso MRN:  [id number] Date of Evaluation:  08/10/2016 Chief Complaint:  schizoaffective disorder Principal Diagnosis: Schizoaffective disorder, depressive type (HCC) Diagnosis:   Patient Active Problem List   Diagnosis Date Noted  . Tobacco use disorder [F17.200] 08/10/2016  . Cocaine use disorder, severe, dependence (HCC) [F14.20] 08/10/2016  . Alcohol use disorder, moderate, dependence (HCC) [F10.20] 08/10/2016  . Cerebrovascular accident (CVA) (HCC) [I63.9] 08/10/2016  . Schizoaffective disorder, depressive type (HCC) [F25.1] 08/09/2016  . Diabetes (HCC) [E11.9] 04/27/2016  . Essential hypertension [I10] 02/19/2016    Psychiatric Specialty Exam: Physical Exam  ROS   Physician Treatment Plan for Primary Diagnosis: Schizoaffective disorder, depressive type (HCC) Long Term Goal(s): Improvement in symptoms so as ready for discharge  Short Term Goals: Ability to identify changes in lifestyle to reduce recurrence of condition will improve, Ability to verbalize feelings will improve and Ability to identify and develop effective coping behaviors will improve  Physician Treatment Plan for Secondary Diagnosis: Principal Problem:   Schizoaffective disorder, depressive type (HCC) Active Problems:   Essential hypertension   Diabetes (HCC)   Tobacco use disorder   Cocaine use disorder, severe, dependence (HCC)   Alcohol use disorder, moderate, dependence (HCC)   Cerebrovascular accident (CVA) (HCC)  Long Term Goal(s): Improvement in symptoms so as ready for discharge  Short Term Goals: Ability to identify and develop effective coping behaviors will improve and Ability to identify triggers associated with substance abuse/mental health issues will improve  I certify that inpatient services furnished can reasonably be expected to improve the patient's condition.    Jimmy FootmanHernandez-Gonzalez,  Jontavious Commons, MD 7/3/20184:08  PM

## 2016-08-10 NOTE — B[facility] Counselor (Signed)
Adult Comprehensive Assessment  Patient ID: Henry Munoz, male   DOB: Jul 10, 1965, 51 y.o.   MRN: [id number]  Information Source: Information source: Patient  Current Stressors:  Educational / Learning stressors: No stressors identified  Employment / Job issues: No stressors identified  Family Relationships: No stressors identified  Surveyor, quantity / Lack of resources (include bankruptcy): Unable to support himself financially at this time Housing / Lack of housing: No stressors identified  Physical health (include injuries & life threatening diseases): Recently had stroke  Social relationships: No stressors identified  Substance abuse: Patient denies substance abuse - toxicology report shows cocaine   Bereavement / Loss: No stressors identified   Living/Environment/Situation:  Living Arrangements: Parent Living conditions (as described by patient or guardian): Lives with parents and brother - "they are fine" How long has patient lived in current situation?: Few years   Family History:  Marital status: Single What is your sexual orientation?: Heterosexual Has your sexual activity been affected by drugs, alcohol, medication, or emotional stress?: N/A Does patient have children?:  (Unknown)  Childhood History:  By whom was/is the patient raised?: Both parents Description of patient's relationship with caregiver when they were a child: Pt states that his upbringing was fine Patient's description of current relationship with people who raised him/her: "They are doing alright" Does patient have siblings?: Yes Description of patient's current relationship with siblings: Patient has one brother  Did patient suffer any verbal/emotional/physical/sexual abuse as a child?: No Did patient suffer from severe childhood neglect?: No Has patient ever been sexually abused/assaulted/raped as an adolescent or adult?: No Was the patient ever a victim of a crime or a disaster?: No Witnessed domestic  violence?: No Has patient been effected by domestic violence as an adult?: No  Education:  Currently a Consulting civil engineer?: No Learning disability?: No  Employment/Work Situation:   Employment situation: Unemployed 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 Has patient ever been in the [employer]?: No Has patient ever served in combat?: No Did You Receive Any Psychiatric Treatment/Services While in Equities trader?: No Are There Guns or Other Weapons in Your Home?: No Are These Comptroller?:  (N/A)  Financial Resources:   Financial resources: Support from parents / caregiver Does patient have a Lawyer or guardian?: No  Alcohol/Substance Abuse:   If attempted suicide, did drugs/alcohol play a role in this?: No Alcohol/Substance Abuse Treatment Hx: Denies past history  Social Support System:   Forensic psychologist System: Fair Museum/gallery exhibitions officer System: Patient reports support from parents and brother that lives with him Type of faith/religion: Unknown How does patient's faith help to cope with current illness?: Unknown   Leisure/Recreation:   Leisure and Hobbies: Unknown   Strengths/Needs:   What things does the patient do well?: Pt unable to identify things he do well  In what areas does patient struggle / problems for patient: Pt stated that he does not know at this time   Discharge Plan:   Does patient have access to transportation?: Yes Will patient be returning to same living situation after discharge?: Yes Currently receiving community mental health services: No If no, would patient like referral for services when discharged?: Yes (What [place] ) Does patient have financial barriers related to discharge medications?: Yes Patient description of barriers related to discharge medications: Referred to medication management clinic   Summary/Recommendations:   Summary and Recommendations (to be  completed by the evaluator): Patient presented to the hospital under  IVC and was admitted bizarre behavior and hallucinations.  Pt's primary diagnosis is schizophrenia disorder. Pt reports primary triggers for admission were auditory and visual hallucinations.  Pt reports his current stressors are lack of finances and relapse on cocaine.  Pt now denies SI/HI/AVH.  [place] in [place], [place].  Pt lists supports in the community as his parents and brother. Patient will benefit from crisis stabilization, medication evaluation, group therapy, and psycho education in addition to case management for discharge planning. Patient and CSW reviewed pt's identified goals and treatment plan. Pt verbalized understanding and agreed to treatment plan.  At discharge it is recommended that patient remain compliant with established plan and continue treatment.  Henry Munoz, MSW, LCSW-A  08/10/2016

## 2016-08-10 NOTE — [facility] Group Notes (Signed)
[facility]  Date/Time:08/10/16  Type of Therapy/Topic:  Group Therapy:  Feelings about Diagnosis  Participation Level:  Did Not Attend    Glennon MacSara P Dorine Duffey, LCSW 08/10/2016, 4:58 PM

## 2016-08-10 NOTE — Progress Notes (Signed)
Recreation Therapy Notes  At approximately 11:05 am, LRT attempted assessment. Patient sleeping and did not wake up when name was called.  Henry Munoz,[name], LRT/CTRS 08/10/2016 11:36 AM

## 2016-08-10 NOTE — Progress Notes (Signed)
D: Pt denies SI/HI/AVH. Pt is pleasant and cooperative, affect is bright. Patient's  thoughts are organized,no bizarre behavior noted, patient's gait is steady during shift , continues to be on fall precaution, and he is interacting with peers and staff appropriately.  A: Pt was offered support and encouragement. Pt was given scheduled medications. Pt was encouraged to attend groups. Q 15 minute checks were done for safety.  R:Pt attends groups and interacts well with peers and staff. Pt is complaint with medication. Pt receptive to treatment and safety maintained on unit, will continue to closely monitor.

## 2016-08-10 NOTE — [facility] Suicide Risk Assessment (Signed)
[facility] INPATIENT:  Family/Significant Other Suicide Prevention Education  Suicide Prevention Education:  Education Completed; mother, Henry LeffSarah Hyland ph#: [phone] has been identified by the patient as the family member/significant other with whom the patient will be residing, and identified as the person(s) who will aid the patient in the event of a mental health crisis (suicidal ideations/suicide attempt).  With written consent from the patient, the family member/significant other has been provided the following suicide prevention education, prior to the and/or following the discharge of the patient.  The suicide prevention education provided includes the following:  Suicide risk factors  Suicide prevention and interventions  National Suicide Hotline telephone number  [facility] assessment telephone number  [facility] Emergency Assistance 911  [facility] and/or [facility] telephone number  Request made of family/significant other to:  Remove weapons (e.g., guns, rifles, knives), all items previously/currently identified as safety concern.    Remove drugs/medications (over-the-counter, prescriptions, illicit drugs), all items previously/currently identified as a safety concern.  The family member/significant other verbalizes understanding of the suicide prevention education information provided.  The family member/significant other agrees to remove the items of safety concern listed above.  Henry OxfordKadijah R Carlette Palmatier, MSW, LCSW-A 08/10/2016, 10:28 AM

## 2016-08-10 NOTE — Progress Notes (Signed)
Recreation Therapy Notes  INPATIENT RECREATION THERAPY ASSESSMENT  Patient Details Name: Forestine ChuteFloyd Lee Vanalstyne MRN: [id number] DOB: 09/14/1965 Today's Date: 08/10/2016  Patient Stressors: Family, Friends (Had to leave house because pt stated his mom was stressful; lack of supportive friends)  Coping Skills:   Isolate, Substance Abuse, Avoidance, Exercise, Art/Dance, Music, Sports  Personal Challenges: Anger, Communication, Expressing Yourself, Relationships, Substance Abuse, Time Management, Trusting Others  Leisure Interests (2+):  Individual - Other (Comment) (Watch movies, exercise)  Awareness of Community Resources:  Yes  Community Resources:  Park  Current Use: Yes  If no, Barriers?:    Patient Strengths:  CopyCharismatic, helpful  Patient Identified Areas of Improvement:  Stomach and arms  Current Recreation Participation:  Smoking crack  Patient Goal for Hospitalization:  To get off crack and get stabilized   [place]  [place] Residence:  [place]   Current SI (including self-harm):  No  Current HI:  No  Consent to Intern Participation: N/A   Jacquelynn CreeGreene,Cola Highfill M, LRT/CTRS 08/10/2016, 3:59 PM

## 2016-08-10 NOTE — Progress Notes (Signed)
Inpatient Diabetes Program Recommendations  AACE/ADA: New Consensus Statement on Inpatient Glycemic Control (2015)  Target Ranges:  Prepandial:   less than 140 mg/dL      Peak postprandial:   less than 180 mg/dL (1-2 hours)      Critically ill patients:  140 - 180 mg/dL   Lab Results  Component Value Date   GLUCAP 206 (H) 08/10/2016   HGBA1C 13.1 (H) 08/09/2016    Review of Glycemic Control  Results for Henry Munoz, Leovardo LEE (MRN [id number]) as of 08/10/2016 09:12  Ref. Range 08/09/2016 16:33 08/09/2016 21:08 08/10/2016 06:42  Glucose-Capillary Latest Ref Range: 65 - 99 mg/dL 409335 (H) 91 811206 (H)    Diabetes history:Type 2 Outpatient Diabetes medications: NPH 70/30 10 units bid, Glumetza 1000mg  bid  Current orders for Inpatient glycemic control: Novolog 70/30 10 units bid, Novolog 0-15 units tid, Metformin 1000mg  bid  Inpatient Diabetes Program Recommendations:  Consider decreasing Novolog correction to 0-9 units tid and add Novolog 0-5 units qhs  Susette RacerJulie Kolleen Ochsner, RN, [place], [place]HA, CDE Diabetes Coordinator Inpatient Diabetes Program  [phone] (Team Pager) [phone] [facility] Office) 08/10/2016 9:14 AM

## 2016-08-10 NOTE — Plan of Care (Signed)
Problem: Safety: Goal: Periods of time without injury will increase Outcome: Progressing Patient remains safe and without injury during hospitalization and on Q 15 minute monitoring. Will continue to monitor patient.    

## 2016-08-11 LAB — GLUCOSE, CAPILLARY
GLUCOSE-CAPILLARY: 233 mg/dL — AB (ref 65–99)
Glucose-Capillary: 223 mg/dL — ABNORMAL HIGH (ref 65–99)
Glucose-Capillary: 269 mg/dL — ABNORMAL HIGH (ref 65–99)
Glucose-Capillary: 244 mg/dL — ABNORMAL HIGH (ref 65–99)

## 2016-08-11 LAB — HEMOGLOBIN A1C
Hgb A1c MFr Bld: 12.6 % — ABNORMAL HIGH (ref 4.8–5.6)
Mean Plasma Glucose: 315 mg/dL

## 2016-08-11 MED ORDER — NICOTINE 21 MG/24HR TD PT24
21.0000 mg | MEDICATED_PATCH | Freq: Every day | TRANSDERMAL | Status: DC
  Administered 2016-08-11 – 2016-08-13 (×3): 21 mg via TRANSDERMAL
  Filled 2016-08-11 (×3): qty 1

## 2016-08-11 MED ORDER — RISPERIDONE 1 MG PO TABS
0.5000 mg | ORAL_TABLET | Freq: Two times a day (BID) | ORAL | Status: DC
  Administered 2016-08-11 (×2): 0.5 mg via ORAL
  Filled 2016-08-11 (×2): qty 1

## 2016-08-11 NOTE — Tx Team (Signed)
Interdisciplinary Treatment and Diagnostic Plan Update  08/11/2016 Time of Session: 11:00 AM Henry Munoz MRN: [id number]  Principal Diagnosis: Psychosis  Secondary Diagnoses: Principal Problem:   Unspecified schizophrenia spectrum disorder Active Problems:   Essential hypertension   Diabetes (HCC)   Tobacco use disorder   Cocaine use disorder, severe, dependence (HCC)   Alcohol use disorder, moderate, dependence (HCC)   Cerebrovascular accident (CVA) (HCC)   Current Medications:  Current Facility-Administered Medications  Medication Dose Route Frequency Provider Last Rate Last Dose  . acetaminophen (TYLENOL) tablet 650 mg  650 mg Oral Q6H PRN Clapacs, John T, MD      . alum & mag hydroxide-simeth (MAALOX/MYLANTA) 200-200-20 MG/5ML suspension 30 mL  30 mL Oral Q4H PRN Clapacs, John T, MD      . amLODipine (NORVASC) tablet 10 mg  10 mg Oral Daily Clapacs, Jackquline Denmark, MD   10 mg at 08/11/16 0818  . aspirin EC tablet 81 mg  81 mg Oral Daily Clapacs, Jackquline Denmark, MD   81 mg at 08/11/16 0818  . atorvastatin (LIPITOR) tablet 40 mg  40 mg Oral Daily Clapacs, Jackquline Denmark, MD   40 mg at 08/11/16 0818  . clopidogrel (PLAVIX) tablet 75 mg  75 mg Oral Daily Clapacs, John T, MD   75 mg at 08/11/16 0818  . hydrochlorothiazide (HYDRODIURIL) tablet 50 mg  50 mg Oral Daily Clapacs, Jackquline Denmark, MD   50 mg at 08/11/16 0818  . insulin aspart (novoLOG) injection 0-5 Units  0-5 Units Subcutaneous QHS Jimmy Footman, MD   3 Units at 08/10/16 2233  . insulin aspart (novoLOG) injection 0-9 Units  0-9 Units Subcutaneous TID WC Jimmy Footman, MD   3 Units at 08/11/16 [phone]  . insulin aspart protamine- aspart (NOVOLOG MIX 70/30) injection 10 Units  10 Units Subcutaneous BID WC Jimmy Footman, MD   10 Units at 08/11/16 1191  . magnesium hydroxide (MILK OF MAGNESIA) suspension 30 mL  30 mL Oral Daily PRN Clapacs, John T, MD      . metFORMIN (GLUCOPHAGE-XR) 24 hr tablet 1,000 mg  1,000 mg Oral  BID WC Clapacs, John T, MD   1,000 mg at 08/11/16 0825  . ondansetron (ZOFRAN) tablet 4 mg  4 mg Oral Q8H PRN Clapacs, John T, MD      . traZODone (DESYREL) tablet 150 mg  150 mg Oral QHS Jimmy Footman, MD   150 mg at 08/10/16 2230   PTA Medications: Prescriptions Prior to Admission  Medication Sig Dispense Refill Last Dose  . amLODipine (NORVASC) 10 MG tablet Take 1 tablet (10 mg total) by mouth daily. 30 tablet 0 unknown at unknown  . aspirin 81 MG EC tablet Take 1 tablet (81 mg total) by mouth daily. 120 tablet 0 unknown at unknown  . atorvastatin (LIPITOR) 40 MG tablet Take 1 tablet (40 mg total) by mouth daily. 30 tablet 0 unknown at unknown  . clopidogrel (PLAVIX) 75 MG tablet Take 1 tablet (75 mg total) by mouth daily. 30 tablet 0 unknown at unknown  . hydrochlorothiazide (HYDRODIURIL) 50 MG tablet Take 1 tablet (50 mg total) by mouth daily. (Patient taking differently: Take 100 mg by mouth daily. ) 30 tablet 0 unknown at unknown  . insulin NPH-regular Human (NOVOLIN 70/30) (70-30) 100 UNIT/ML injection Inject 10 Units into the skin 2 (two) times daily with a meal. 10 mL 0 unknown at unknown  . metFORMIN (GLUMETZA) 1000 MG (MOD) 24 hr tablet Take 1 tablet (1,000 mg total)  by mouth 2 (two) times daily with a meal. 180 tablet 3 unknown at unknown  . ondansetron (ZOFRAN) 4 MG tablet Take 1 tablet (4 mg total) by mouth every 8 (eight) hours as needed for nausea or vomiting. 20 tablet 0 prn at prn  . QUEtiapine (SEROQUEL) 100 MG tablet Take 1 tablet (100 mg total) by mouth at bedtime. 30 tablet 1 unknown at unknown    Patient Stressors: Financial difficulties Health problems Marital or family conflict Medication change or noncompliance Substance abuse  Patient Strengths: General fund of knowledge Supportive family/friends  Treatment Modalities: Medication Management, Group therapy, Case management,  1 to 1 session with clinician, Psychoeducation, Recreational  therapy.   Physician Treatment Plan for Primary Diagnosis: Psychosis Long Term Goal(s): Improvement in symptoms so as ready for discharge Improvement in symptoms so as ready for discharge   Short Term Goals: Ability to identify changes in lifestyle to reduce recurrence of condition will improve Ability to verbalize feelings will improve Ability to identify and develop effective coping behaviors will improve Ability to identify and develop effective coping behaviors will improve Ability to identify triggers associated with substance abuse/mental health issues will improve  Medication Management: Evaluate patient's response, side effects, and tolerance of medication regimen.  Therapeutic Interventions: 1 to 1 sessions, Unit Group sessions and Medication administration.  Evaluation of Outcomes: Progressing  Physician Treatment Plan for Secondary Diagnosis: Principal Problem:   Unspecified schizophrenia spectrum disorder Active Problems:   Essential hypertension   Diabetes (HCC)   Tobacco use disorder   Cocaine use disorder, severe, dependence (HCC)   Alcohol use disorder, moderate, dependence (HCC)   Cerebrovascular accident (CVA) (HCC)  Long Term Goal(s): Improvement in symptoms so as ready for discharge Improvement in symptoms so as ready for discharge   Short Term Goals: Ability to identify changes in lifestyle to reduce recurrence of condition will improve Ability to verbalize feelings will improve Ability to identify and develop effective coping behaviors will improve Ability to identify and develop effective coping behaviors will improve Ability to identify triggers associated with substance abuse/mental health issues will improve     Medication Management: Evaluate patient's response, side effects, and tolerance of medication regimen.  Therapeutic Interventions: 1 to 1 sessions, Unit Group sessions and Medication administration.  Evaluation of Outcomes:  Progressing   RN Treatment Plan for Primary Diagnosis: Psychosis Long Term Goal(s): Knowledge of disease and therapeutic regimen to maintain health will improve  Short Term Goals: Ability to remain free from injury will improve, Ability to verbalize feelings will improve, Ability to identify and develop effective coping behaviors will improve and Compliance with prescribed medications will improve  Medication Management: RN will administer medications as ordered by provider, will assess and evaluate patient's response and provide education to patient for prescribed medication. RN will report any adverse and/or side effects to prescribing provider.  Therapeutic Interventions: 1 on 1 counseling sessions, Psychoeducation, Medication administration, Evaluate responses to treatment, Monitor vital signs and CBGs as ordered, Perform/monitor CIWA, COWS, AIMS and Fall Risk screenings as ordered, Perform wound care treatments as ordered.  Evaluation of Outcomes: Progressing   LCSW Treatment Plan for Primary Diagnosis: Psychosis Long Term Goal(s): Safe transition to appropriate next level of care at discharge, Engage patient in therapeutic group addressing interpersonal concerns.  Short Term Goals: Engage patient in aftercare planning with referrals and resources and Increase social support  Therapeutic Interventions: Assess for all discharge needs, 1 to 1 time with Social worker, Explore available resources and support systems,  Assess for adequacy in community support network, Educate family and significant other(s) on suicide prevention, Complete Psychosocial Assessment, Interpersonal group therapy.  Evaluation of Outcomes: Progressing    Recreational Therapy Treatment Plan for Primary Diagnosis: Psychosis Long Term Goal(s): Patient will participate in recreation therapy treatment in at least 2 group sessions without prompting from LRT  Short Term Goals: Increase healthy coping skills  Treatment  Modalities: Group Therapy and Individual Treatment Sessions  Therapeutic Interventions: Psychoeducation  Evaluation of Outcomes: Progressing   Progress in Treatment: Attending groups: No. Participating in groups: No. Taking medication as prescribed: Yes. Toleration medication: Yes. Family/Significant other contact made: Yes, individual(s) contacted:  CSW spoke with patient's mother. Patient understands diagnosis: Yes. Discussing patient identified problems/goals with staff: Yes. Medical problems stabilized or resolved: Yes. Denies suicidal/homicidal ideation: Yes. Issues/concerns per patient self-inventory: No.  New problem(s) identified: No, Describe:  None.  New Short Term/Long Term Goal(s): Patient stated that his goal is to get on the right medications for voices.  Discharge Plan or Barriers: CSW assessing appropriate discharge plans.  Reason for Continuation of Hospitalization: Hallucinations Medication stabilization  Psychosis  Estimated Length of Stay: 2 days   Attendees: Patient: Henry Munoz  08/11/2016 11:37 AM  Physician: Dr. Radene JourneyAndrea Hernandez, MD  08/11/2016 11:37 AM  Nursing: Hulan AmatoGwen Farrish, RN  08/11/2016 11:37 AM  RN Care Manager: 08/11/2016 11:37 AM  Social Worker: Hampton AbbotKadijah Grant, MSW, LCSW-A 08/11/2016 11:37 AM  Recreational Therapist: Princella IonElizabeth Mende [place], LRT/CTRS  08/11/2016 11:37 AM  Other:  08/11/2016 11:37 AM  Other:  08/11/2016 11:37 AM  Other: 08/11/2016 11:37 AM    Scribe for Treatment Team: Lynden OxfordKadijah R Grant, LCSWA 08/11/2016 11:37 AM

## 2016-08-11 NOTE — Plan of Care (Signed)
Problem: Safety: Goal: Ability to remain free from injury will improve Outcome: Progressing Patient remains safe on the unit. Gait remains unsteady, patient encouraged to keep room free from clutter that may cause a fall.

## 2016-08-11 NOTE — [facility] Group Notes (Signed)
[facility] Group Notes:  (Nursing/MHT/Case Management/Adjunct)  Date:  08/11/2016  Time:  11:22 PM  Type of Therapy:  Psychoeducational Skills  Participation Level:  Active  Participation Quality:  Appropriate, Attentive, Sharing and Supportive  Affect:  Appropriate  Cognitive:  Alert and Oriented  Insight:  Appropriate and Good  Engagement in Group:  Engaged  Modes of Intervention:  Discussion and Exploration  Summary of Progress/Problems:  Henry Munoz 08/11/2016, 11:22 PM
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
## 2016-08-11 NOTE — Progress Notes (Signed)
DAR Note: Pt at the time of assessment was flat and withdrawn to self even while in the dayroom. Pt at the time endorsed moderate anxiety and depression. Pt also complain of some passive SI and AH; states, "the voices have been telling me earlier to do something to myself; they have been quiet for some time now." Pt contracts for safety. Pt denies HI or pain. Medications offered as prescribed. All patient's questions and concerns addressed. Support, encouragement, and safe environment provided. 15-minute safety checks continue. Pt was med compliant. Safety checks continue.

## 2016-08-11 NOTE — Progress Notes (Signed)
Patient alert and oriented x 4. Patient has been isolative to self in room, minimal interaction with peers noted. Patient expressed passive SI, states-"I still feel somewhat suicidal. I can hear voices in my head telling me to get up and get my shoes on and leave out of here." Patient verbalizes the ability to contract for safety. Compliant with meals and medications. Breathing even and unlabored, gait unsteady due to left sided weakness, milieu remains safe. Will continue to monitor client and report any changes to the MD.

## 2016-08-11 NOTE — [facility] Group Notes (Signed)
[facility] Group Notes:  (Nursing/MHT/Case Management/Adjunct)  Date:  08/11/2016  Time:  7:09 AM  Type of Therapy:  Psychoeducational Skills  Participation Level:  Did Not Attend  Summary of Progress/Problems:  [name] 08/11/2016, 7:09 AM 

## 2016-08-12 DIAGNOSIS — G3184 Mild cognitive impairment, so stated: Secondary | ICD-10-CM

## 2016-08-12 DIAGNOSIS — F329 Major depressive disorder, single episode, unspecified: Secondary | ICD-10-CM

## 2016-08-12 LAB — GLUCOSE, CAPILLARY
GLUCOSE-CAPILLARY: 161 mg/dL — AB (ref 65–99)
GLUCOSE-CAPILLARY: 169 mg/dL — AB (ref 65–99)
GLUCOSE-CAPILLARY: 219 mg/dL — AB (ref 65–99)
Glucose-Capillary: 195 mg/dL — ABNORMAL HIGH (ref 65–99)

## 2016-08-12 MED ORDER — ATORVASTATIN CALCIUM 40 MG PO TABS: 40.0000 mg | ORAL_TABLET | Freq: Every day | ORAL | 0 refills | Status: DC

## 2016-08-12 MED ORDER — INSULIN ASPART PROT & ASPART (70-30 MIX) 100 UNIT/ML ~~LOC~~ SUSP: 11.0000 [IU] | Freq: Every day | SUBCUTANEOUS | 0 refills | Status: DC

## 2016-08-12 MED ORDER — ASPIRIN 81 MG PO TBEC: 81.0000 mg | DELAYED_RELEASE_TABLET | Freq: Every day | ORAL | 0 refills | Status: DC

## 2016-08-12 MED ORDER — RISPERIDONE 1 MG PO TABS: 1.0000 mg | ORAL_TABLET | Freq: Every day | ORAL | 0 refills | Status: DC

## 2016-08-12 MED ORDER — TRAZODONE HCL 150 MG PO TABS: 150.0000 mg | ORAL_TABLET | Freq: Every day | ORAL | 0 refills | Status: DC

## 2016-08-12 MED ORDER — AMLODIPINE BESYLATE 10 MG PO TABS: 10.0000 mg | ORAL_TABLET | Freq: Every day | ORAL | 0 refills | Status: DC

## 2016-08-12 MED ORDER — HYDROCHLOROTHIAZIDE 50 MG PO TABS: 50.0000 mg | ORAL_TABLET | Freq: Every day | ORAL | 0 refills | Status: DC

## 2016-08-12 MED ORDER — METFORMIN HCL ER (OSM) 1000 MG PO TB24: 1000.0000 mg | ORAL_TABLET | Freq: Two times a day (BID) | ORAL | 0 refills | Status: DC

## 2016-08-12 MED ORDER — INSULIN ASPART PROT & ASPART (70-30 MIX) 100 UNIT/ML ~~LOC~~ SUSP: 12.0000 [IU] | Freq: Every day | SUBCUTANEOUS | 0 refills | Status: DC

## 2016-08-12 MED ORDER — CLOPIDOGREL BISULFATE 75 MG PO TABS: 75.0000 mg | ORAL_TABLET | Freq: Every day | ORAL | 0 refills | Status: DC

## 2016-08-12 MED ORDER — SERTRALINE HCL 50 MG PO TABS: 50.0000 mg | ORAL_TABLET | Freq: Every day | ORAL | 0 refills | Status: DC

## 2016-08-12 MED ORDER — INSULIN ASPART PROT & ASPART (70-30 MIX) 100 UNIT/ML ~~LOC~~ SUSP
12.0000 [IU] | Freq: Every day | SUBCUTANEOUS | Status: DC
  Administered 2016-08-13: 12 [IU] via SUBCUTANEOUS
  Filled 2016-08-12: qty 10

## 2016-08-12 MED ORDER — RISPERIDONE 1 MG PO TABS
1.0000 mg | ORAL_TABLET | Freq: Every day | ORAL | Status: DC
  Administered 2016-08-12: 1 mg via ORAL
  Filled 2016-08-12: qty 1

## 2016-08-12 MED ORDER — SERTRALINE HCL 25 MG PO TABS
50.0000 mg | ORAL_TABLET | Freq: Every day | ORAL | Status: DC
  Administered 2016-08-12 – 2016-08-13 (×2): 50 mg via ORAL
  Filled 2016-08-12 (×3): qty 2

## 2016-08-12 MED ORDER — INSULIN ASPART PROT & ASPART (70-30 MIX) 100 UNIT/ML ~~LOC~~ SUSP
11.0000 [IU] | Freq: Every day | SUBCUTANEOUS | Status: DC
  Administered 2016-08-12: 11 [IU] via SUBCUTANEOUS
  Filled 2016-08-12: qty 10

## 2016-08-12 NOTE — [facility] Group Notes (Signed)
[facility] LCSW Group Therapy Note  Type of Therapy and Topic:  Group Therapy:  Goals Group: SMART Goals  Participation Level:  Patient did not attend group. CSW invited patient to group.   Description of Group:   The purpose of a daily goals group is to assist and guide patients in setting recovery/wellness-related goals.  The objective is to set goals as they relate to the crisis in which they were admitted. Patients will be using SMART goal modalities to set measurable goals.  Characteristics of realistic goals will be discussed and patients will be assisted in setting and processing how one will reach their goal. Facilitator will also assist patients in applying interventions and coping skills learned in psycho-education groups to the SMART goal and process how one will achieve defined goal.  Therapeutic Goals: -Patients will develop and document one goal related to or their crisis in which brought them into treatment. -Patients will be guided by LCSW using SMART goal setting modality in how to set a measurable, attainable, realistic and time sensitive goal.  -Patients will process barriers in reaching goal. -Patients will process interventions in how to overcome and successful in reaching goal.   Summary of Patient Progress:  Patient Goal: None identified at this time.   Therapeutic Modalities:   Motivational Interviewing Engineer, manufacturing systemsCognitive Behavioral Therapy Crisis Intervention Model SMART goals setting  Anijah Spohr G. Garnette CzechSampson MSW, LCSWA 08/12/2016 2:02 PM

## 2016-08-12 NOTE — Plan of Care (Signed)
Problem: [facility] Participation in Recreation Therapeutic Interventions Goal: STG-Patient will identify at least five coping skills for ** STG: Coping Skills - Within 4 treatment session, patient will verbalize at least 5 coping skills for anger in each of 2 treatment sessions to increase anger management skills.  Outcome: Not Applicable Date Met: 72/62/03 Patient stated he managed his anger well and did not want to work on anger management.  Leonette Monarch, LRT/CTRS 07.05.18 3:09 pm  Problem: [facility] Participation in Recreation Therapeutic Interventions Goal: STG-Patient will identify at least five coping skills for ** STG: Coping Skills - Within 4 treatment sessions, patient will verbalize at least 5 coping skills for substance abuse in each of 2 treatment sessions to decrease substance abuse.  Outcome: Progressing Treatment Session 1; Completed 1 out of 2: At approximately 2:40 pm, LRT met with patient in consultation room. Patient verbalized 5 coping skills for substance abuse. LRT educated patient on leisure and why it is important to implement it into his schedule. LRT educated and provided patient with blank schedules to help him plan his day and try to avoid using substances. LRT educated patient on healthy support systems.  Leonette Monarch, LRT/CTRS 07.05.18 3:13 pm

## 2016-08-12 NOTE — Progress Notes (Signed)
Recreation Therapy Notes  Date: 07.05.18 Time: 9:30 am Location: Craft Room  Group Topic: Leisure Education  Goal Area(s) Addresses:  Patient will identify things they are grateful for. Patient will identify how being grateful can influence decision making.  Behavioral Response: Attentive, Interactive  Intervention: Grateful Wheel  Activity: Patients were given an I Am Grateful For worksheet and were instructed to write things they are grateful for under each category.  Education: LRT educated patients on leisure.  Education Outcome: Acknowledges education/In group clarification offered   Clinical Observations/Feedback: Patient wrote things he is grateful for. Patient contributed to group discussion by stating things he is grateful for, what decisions he makes when he is aware of things he is grateful for, and why he does not think of things he is grateful for often.  Jacquelynn CreeGreene,Micahel Omlor M, LRT/CTRS 08/12/2016 10:24 AM

## 2016-08-12 NOTE — Plan of Care (Signed)
Problem: Activity: Goal: Sleeping patterns will improve Outcome: Progressing Patient slept for Estimated Hours of 5.45; q15 minutes safety round maintained, no injury or falls during this shift.    

## 2016-08-12 NOTE — Plan of Care (Signed)
Problem: Safety: Goal: Periods of time without injury will increase Outcome: Progressing Patient remains free from injury with 15 safety checks on the unit.  Problem: Activity: Goal: Will verbalize the importance of balancing activity with adequate rest periods Outcome: Progressing Patient noted to rest in between meals and classes.  Problem: Education: Goal: Will be free of psychotic symptoms Outcome: Progressing Patient does not exhibit any psychotic symptoms at this time. Goal: Knowledge of the prescribed therapeutic regimen will improve Outcome: Progressing Patient verbalized the understanding of his prescribed therapeutic regimen  Problem: Coping: Goal: Ability to cope will improve Outcome: Not Progressing Patient's ability to cope will improve Goal: Ability to verbalize feelings will improve Outcome: Progressing Patient able to verbalize his feelings appropriately.  Problem: Health Behavior/Discharge Planning: Goal: Compliance with prescribed medication regimen will improve Outcome: Progressing Patient is compliant with all of his meds.  Problem: Nutritional: Goal: Ability to achieve adequate nutritional intake will improve Outcome: Progressing Patient continues to have a balanced diet and snack between meals.  Problem: Safety: Goal: Ability to remain free from injury will improve Outcome: Progressing Patient remains safe on the unit.

## 2016-08-12 NOTE — Plan of Care (Signed)
Problem: Coping: Goal: Ability to verbalize frustrations and anger appropriately will improve Outcome: Not Progressing Patient continues to display frustrations to staff when he feels his needs are not being met immediately.

## 2016-08-12 NOTE — Progress Notes (Signed)
Patient up ad lib ambulating on the unit with a limp d/t L) sided weakness.  Alert and oriented x 4. Affect flat with selective peer interaction noted. Compliant with meals and medications. BS monitored ac and hs with coverage administered when needed.  Respiratory even and unlabored. Safety remains on the unit with q 15 minutes checks.

## 2016-08-12 NOTE — Progress Notes (Signed)
Patient ID: Forestine ChuteFloyd Lee Munoz, male   DOB: 1965/08/25, 51 y.o.   MRN: [id number] Disheveled, poor body hygiene, mood and affect flat, visible in the milieu and socializing with peers; unsteady gait due to h/o CVA, reminded to use call bell to prompt for help before getting OOB, denied SI/SIB/AVH

## 2016-08-12 NOTE — [facility] Group Notes (Signed)
[facility] LCSW Group Therapy   08/12/2016 1pm   Type of Therapy: Group Therapy   Participation Level: Active   Participation Quality: Attentive, Sharing and Supportive   Affect: Appropriate   Cognitive: Alert and Oriented   Insight: Developing/Improving and Engaged   Engagement in Therapy: Developing/Improving and Engaged   Modes of Intervention: Clarification, Confrontation, Discussion, Education, Exploration, Limit-setting, Orientation, Problem-solving, Rapport Building, Dance movement psychotherapisteality Testing, Socialization and Support   Summary of Progress/Problems: The topic for group was balance in life. Today's group focused on defining balance in one's own words, identifying things that can knock one off balance, and exploring healthy ways to maintain balance in life. Group members were asked to provide an example of a time when they felt off balance, describe how they handled that situation, and process healthier ways to regain balance in the future. Group members were asked to share the most important tool for maintaining balance [facility] and how they plan to apply this method after discharge.   Henry Munoz, MSW, LCSW-A 08/12/2016, 3:52PM

## 2016-08-12 NOTE — [facility] Suicide Risk Assessment (Signed)
[facility]   Principal Problem: MDD (major depressive disorder) Discharge Diagnoses:  Patient Active Problem List   Diagnosis Date Noted  . MDD (major depressive disorder) [F32.9] 08/12/2016  . 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 Tobacco use disorder [F17.200] 08/10/2016  . Cocaine use disorder, severe, dependence (HCC) [F14.20] 08/10/2016  . Alcohol use disorder, moderate, dependence (HCC) [F10.20] 08/10/2016  . Cerebrovascular accident (CVA) (HCC) [I63.9] 08/10/2016  . Diabetes (HCC) [E11.9] 04/27/2016  . Essential hypertension [I10] 02/19/2016     Psychiatric Specialty Exam: ROS  Blood pressure (!) 143/97, pulse 67, temperature 98.2 F (36.8 C), resp. rate 18, height 5\' 7"  (1.702 m), weight 107.5 kg (237 lb), SpO2 100 %.Body mass index is 37.12 kg/m.                                                       Mental Status Per Nursing Assessment::   On Admission:  NA  Demographic Factors:  Male, Low socioeconomic status and Unemployed  Loss Factors: Decline in physical health and Financial problems/change in socioeconomic status  Historical Factors: Impulsivity  Risk Reduction Factors:   Positive social support  Continued Clinical Symptoms:  Alcohol/Substance Abuse/Dependencies Previous Psychiatric Diagnoses and Treatments Medical Diagnoses and Treatments/Surgeries  Cognitive Features That Contribute To Risk:  Closed-mindedness    Suicide Risk:  Minimal: No identifiable suicidal ideation.  Patients presenting with no risk factors but with morbid ruminations; may be classified as minimal risk based on the severity of the depressive symptoms  Follow-up Information    [facility] Go on 08[facility]26[facility]2016.   Specialty:  Primary Care Why:  Your appointment is a 7:30 pm Contact information: [address] [place]  [id number] [phone]       [facility] Go on 08/17[facility]2016.   Specialty:  General Practice Why:  Your appointment is at 10:30 am. Please bring picture ID, proof of address and proof of income. Contact information: [address] [place] [address] [phone]        [facility] Go on 08/16/2016.   Why:  Please follow-up with [facility] on July [facility] at [facility] for medication management and therapy. Please call Unk PintoHarvey Bryant @ [phone]. Contact information: [address] [place] [address] [phone]            Jimmy FootmanHernandez-Gonzalez,  Torrence Branagan, MD 08/12/2016, 3:26 PM

## 2016-08-12 NOTE — Progress Notes (Signed)
Inpatient Diabetes Program Recommendations  AACE/ADA: New Consensus Statement on Inpatient Glycemic Control (2015)  Target Ranges:  Prepandial:   less than 140 mg/dL      Peak postprandial:   less than 180 mg/dL (1-2 hours)      Critically ill patients:  140 - 180 mg/dL   Lab Results  Component Value Date   GLUCAP 161 (H) 08/12/2016   HGBA1C 12.6 (H) 08/10/2016    Review of Glycemic Control  Results for Henry Munoz, Fields LEE (MRN [id number]) as of 08/12/2016 07:56  Ref. Range 08/11/2016 06:50 08/11/2016 11:43 08/11/2016 16:36 08/11/2016 20:36 08/12/2016 06:53  Glucose-Capillary Latest Ref Range: 65 - 99 mg/dL 188223 (H) 416269 (H) 606233 (H) 244 (H) 161 (H)     Diabetes history:Type 2 Outpatient Diabetes medications: NPH 70/30 10 units bid, Glumetza 1000mg  bid  Current orders for Inpatient glycemic control: Novolog 70/30 10 units bid, Novolog 0-9 units tid, Metformin 1000mg  bid, Novolog 0-5 units qhs  Inpatient Diabetes Program Recommendations:  Consider increasing Novolog 70/30 to 12 units qam, Novolog 70/30 to 11 units pre-supper.   Susette RacerJulie Rosaria Kubin, RN, BA, MHA, CDE Diabetes Coordinator Inpatient Diabetes Program  [phone] (Team Pager) [phone] [facility] Office) 08/12/2016 7:56 AM
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
## 2016-08-13 LAB — GLUCOSE, CAPILLARY
Glucose-Capillary: 165 mg/dL — ABNORMAL HIGH (ref 65–99)
Glucose-Capillary: 281 mg/dL — ABNORMAL HIGH (ref 65–99)

## 2016-08-13 NOTE — Plan of Care (Signed)
Problem: Activity: Goal: Interest or engagement in activities will improve Outcome: Progressing Slight improvement in participation Goal: Sleeping patterns will improve Outcome: Progressing Improved sleep >6 hours sleep  Problem: Education: Goal: Emotional status will improve Outcome: Progressing Pt not longer agitated and less hallucinations

## 2016-08-13 NOTE — Progress Notes (Signed)
Pt appeared to sleep about 7 hours while monitored on 15 minute safety checks. 

## 2016-08-13 NOTE — Progress Notes (Signed)
Patient discharged on above date/time, denied SI/HI and AVH . Patient picked up by his brother. Discharge instructions reviewed with patient by this writer prior to departure from the unit with patient verbalizing understanding of the information. Medications, Rx's and personal items handed to patient at discharge. Affect appropriate to situation, no complaints voiced.

## 2016-08-13 NOTE — Progress Notes (Signed)
Recreation Therapy Notes  INPATIENT RECREATION TR PLAN  Patient Details Name: Henry Munoz MRN: [id number] DOB: 07/02/1965 Today's Date: 08/13/2016  Rec Therapy Plan Is patient appropriate for Therapeutic Recreation?: Yes Treatment times per week: At least once a week TR Treatment/Interventions: 1:1 session, Group participation (Comment) (Appropriate participation in daily recreational therapy tx)  Discharge Criteria Pt will be discharged from therapy if:: Treatment goals are met, Discharged Treatment plan/goals/alternatives discussed and agreed upon by:: Patient/family  Discharge Summary Short term goals set: See Care Plan Short term goals met: Complete Progress toward goals comments: One-to-one attended Which groups?: Coping skills, Leisure education One-to-one attended: Coping Skills Reason goals not met: N/A Therapeutic equipment acquired: None Reason patient discharged from therapy: Discharge from hospital Pt/family agrees with progress & goals achieved: Yes Date patient discharged from therapy: 08/13/16   Leonette Monarch, LRT/CTRS 08/13/2016, 3:29 PM

## 2016-08-13 NOTE — Progress Notes (Signed)
Recreation Therapy Notes  Date: 07.06.18 Time: 9:30 am Location: Craft Room  Group Topic: Coping Skills  Goal Area(s) Addresses:  Patient will verbalize at least one emotion while participating in group. Patient will verbalize benefit of using art as a coping skill.  Behavioral Response: Attentive  Intervention: Coloring  Activity: Patients were given coloring sheets to color and were instructed to think about what emotions they were feeling and what their minds were focused on.  Education: LRT educated patients on healthy coping skills.  Education Outcome: Patient left before LRT educated group.  Clinical Observations/Feedback: Patient colored coloring sheet. Patient left group at approximately 9:57 am for a phone call. Patient did not return to group.  Jacquelynn CreeGreene,Tiwanda Threats M, LRT/CTRS 08/13/2016 10:36 AM

## 2016-08-13 NOTE — Progress Notes (Signed)
Patient is alert and oriented to person, place and time. Skin is warm, dry and intact. No limitations to all four extremities noted. Patient currently denies SI at this time. Medication is taken by patient without difficulty nor noted side affects. Patient was observed ambulating in hall during the shift with a steady gait. Attends meals and group with selective peer interaction noted. VS WNL, milieu remains therapeutic. Patient's BS monitored and coverage administered. Patient is scheduled to be discharged today.

## 2016-08-13 NOTE — Progress Notes (Signed)
Pt more visible on the periphery of unit.  Observed on the phone frequently looking disinterested in conversation he was having and held the phone upside down on his face. Pt's BS was 219 at HS given Humalog 2 units at HS and ate a snack. Pt denies current s/i, h/i or hallucinations.  Pt medication compliant. Monitored on 15 minute safety checks and maintained safety on the unit.

## 2016-08-13 NOTE — Progress Notes (Addendum)
  [facility] Adult Case Management Discharge Plan :  Will you be returning to the same living situation after discharge: Yes, returning home. At discharge, do you have transportation home?: Brother will pick pt up. Do you have the ability to pay for your medications: No.  [facility]  Patient's signature needed at discharge.  Patient to Follow up at: Follow-up Information    [facility]. Go on 08/26/2016.   Specialty:  Primary Care Why:  Your appointment is a 7:30 pm Contact information: [address] [place] [id number] [phone]       [facility] Go on 08/17/[facility].   Specialty:  General Practice Why:  Your appointment is at 10:30 am. Please bring picture ID, proof of address and proof of income. Contact information: [address] [place] [address] [phone]        [facility] Go on 08/16/2016.   Why:  Please follow-up with [facility] on July 9th at [facility] for medication management and therapy. Please call Unk PintoHarvey Bryant @ [phone]. Contact information: [address] [place] [address] [phone]           Next level of care provider has access to [facility] Link:no  Safety Planning and Suicide Prevention discussed: Yes,  SPE completed with pt's mother.  Have you used any form of tobacco in the last 30 days? (Cigarettes, Smokeless Tobacco, Cigars, and/or Pipes): Yes  Has patient been referred to the Quitline?: Patient refused referral  Patient has been referred for addiction treatment: Yes  Lynden OxfordKadijah R Najiyah Paris, MSW, LCSW-A 08/13/2016, 9:07 AM

## 2016-08-13 NOTE — Plan of Care (Signed)
Problem: [facility] Participation in Recreation Therapeutic Interventions Goal: STG-Patient will identify at least five coping skills for ** STG: Coping Skills - Within 4 treatment sessions, patient will verbalize at least 5 coping skills for substance abuse in each of 2 treatment sessions to decrease substance abuse.  Outcome: Completed/Met Date Met: 08/13/16 Treatment Session 2; Completed 2 out of 2: At approximately 8:40 am, LRT met with patient in patient room. Patient verbalized 5 coping skills for substance abuse. LRT encouraged patient to use his healthy coping skills instead of turning to substances.  Leonette Monarch, LRT/CTRS 07.06.18 3:28 pm

## 2016-08-26 ENCOUNTER — Ambulatory Visit: Payer: Self-pay

## 2017-10-09 ENCOUNTER — Inpatient Hospital Stay
Admission: EM | Admit: 2017-10-09 | Discharge: 2017-10-10 | DRG: 092 | Discharge status: Against Medical Advice | Payer: Medicaid Other | Attending: Internal Medicine | Admitting: Internal Medicine

## 2017-10-09 ENCOUNTER — Emergency Department: Payer: Medicaid Other

## 2017-10-09 ENCOUNTER — Encounter: Payer: Self-pay | Admitting: Emergency Medicine

## 2017-10-09 DIAGNOSIS — I639 Cerebral infarction, unspecified: Secondary | ICD-10-CM | POA: Diagnosis present

## 2017-10-09 DIAGNOSIS — E785 Hyperlipidemia, unspecified: Secondary | ICD-10-CM | POA: Diagnosis present

## 2017-10-09 DIAGNOSIS — Y92009 Unspecified place in unspecified non-institutional (private) residence as the place of occurrence of the external cause: Secondary | ICD-10-CM

## 2017-10-09 DIAGNOSIS — I10 Essential (primary) hypertension: Secondary | ICD-10-CM | POA: Diagnosis present

## 2017-10-09 DIAGNOSIS — Z6837 Body mass index (BMI) 37.0-37.9, adult: Secondary | ICD-10-CM

## 2017-10-09 DIAGNOSIS — F250 Schizoaffective disorder, bipolar type: Secondary | ICD-10-CM | POA: Diagnosis present

## 2017-10-09 DIAGNOSIS — R609 Edema, unspecified: Secondary | ICD-10-CM

## 2017-10-09 DIAGNOSIS — E860 Dehydration: Secondary | ICD-10-CM | POA: Diagnosis present

## 2017-10-09 DIAGNOSIS — Z7982 Long term (current) use of aspirin: Secondary | ICD-10-CM

## 2017-10-09 DIAGNOSIS — F17210 Nicotine dependence, cigarettes, uncomplicated: Secondary | ICD-10-CM | POA: Diagnosis present

## 2017-10-09 DIAGNOSIS — E119 Type 2 diabetes mellitus without complications: Secondary | ICD-10-CM

## 2017-10-09 DIAGNOSIS — Z888 Allergy status to other drugs, medicaments and biological substances status: Secondary | ICD-10-CM

## 2017-10-09 DIAGNOSIS — Z7902 Long term (current) use of antithrombotics/antiplatelets: Secondary | ICD-10-CM

## 2017-10-09 DIAGNOSIS — Z79899 Other long term (current) drug therapy: Secondary | ICD-10-CM

## 2017-10-09 DIAGNOSIS — Z5321 Procedure and treatment not carried out due to patient leaving prior to being seen by health care provider: Secondary | ICD-10-CM | POA: Diagnosis present

## 2017-10-09 DIAGNOSIS — F1110 Opioid abuse, uncomplicated: Secondary | ICD-10-CM | POA: Diagnosis present

## 2017-10-09 DIAGNOSIS — R4182 Altered mental status, unspecified: Secondary | ICD-10-CM

## 2017-10-09 DIAGNOSIS — R52 Pain, unspecified: Secondary | ICD-10-CM

## 2017-10-09 DIAGNOSIS — I69354 Hemiplegia and hemiparesis following cerebral infarction affecting left non-dominant side: Secondary | ICD-10-CM

## 2017-10-09 DIAGNOSIS — G92 Toxic encephalopathy: Principal | ICD-10-CM | POA: Diagnosis present

## 2017-10-09 DIAGNOSIS — G9340 Encephalopathy, unspecified: Secondary | ICD-10-CM | POA: Diagnosis present

## 2017-10-09 DIAGNOSIS — T50905A Adverse effect of unspecified drugs, medicaments and biological substances, initial encounter: Secondary | ICD-10-CM | POA: Diagnosis present

## 2017-10-09 DIAGNOSIS — Z794 Long term (current) use of insulin: Secondary | ICD-10-CM

## 2017-10-09 DIAGNOSIS — G3184 Mild cognitive impairment, so stated: Secondary | ICD-10-CM | POA: Diagnosis present

## 2017-10-09 DIAGNOSIS — I69318 Other symptoms and signs involving cognitive functions following cerebral infarction: Secondary | ICD-10-CM

## 2017-10-09 HISTORY — DX: Transient cerebral ischemic attack, unspecified: G45.9

## 2017-10-09 LAB — CBC
HCT: 34.8 % — ABNORMAL LOW (ref 40.0–52.0)
Hemoglobin: 11.9 g/dL — ABNORMAL LOW (ref 13.0–18.0)
MCH: 31.9 pg (ref 26.0–34.0)
MCHC: 34.1 g/dL (ref 32.0–36.0)
MCV: 93.5 fL (ref 80.0–100.0)
Platelets: 280 10*3/uL (ref 150–440)
RBC: 3.72 MIL/uL — ABNORMAL LOW (ref 4.40–5.90)
RDW: 15.3 % — AB (ref 11.5–14.5)
WBC: 5.9 10*3/uL (ref 3.8–10.6)

## 2017-10-09 LAB — URINALYSIS, COMPLETE (UACMP) WITH MICROSCOPIC
Bacteria, UA: NONE SEEN
Bilirubin Urine: NEGATIVE
GLUCOSE, UA: NEGATIVE mg/dL
Ketones, ur: NEGATIVE mg/dL
NITRITE: NEGATIVE
PH: 5 (ref 5.0–8.0)
Protein, ur: NEGATIVE mg/dL
SPECIFIC GRAVITY, URINE: 1.026 (ref 1.005–1.030)

## 2017-10-09 LAB — CK: Total CK: 96 U/L (ref 49–397)

## 2017-10-09 LAB — ACETAMINOPHEN LEVEL

## 2017-10-09 LAB — TROPONIN I

## 2017-10-09 LAB — HEPATIC FUNCTION PANEL
ALK PHOS: 102 U/L (ref 38–126)
ALT: 34 U/L (ref 0–44)
AST: 21 U/L (ref 15–41)
Albumin: 4 g/dL (ref 3.5–5.0)
BILIRUBIN TOTAL: 0.5 mg/dL (ref 0.3–1.2)
Total Protein: 7.5 g/dL (ref 6.5–8.1)

## 2017-10-09 LAB — BASIC METABOLIC PANEL
ANION GAP: 6 (ref 5–15)
BUN: 23 mg/dL — AB (ref 6–20)
CALCIUM: 9.3 mg/dL (ref 8.9–10.3)
CO2: 29 mmol/L (ref 22–32)
Chloride: 105 mmol/L (ref 98–111)
Creatinine, Ser: 0.94 mg/dL (ref 0.61–1.24)
GFR calc Af Amer: >60 mL/min (ref >60)
GLUCOSE: 133 mg/dL — AB (ref 70–99)
Potassium: 3.7 mmol/L (ref 3.5–5.1)
SODIUM: 140 mmol/L (ref 135–145)

## 2017-10-09 LAB — GLUCOSE, CAPILLARY: Glucose-Capillary: 155 mg/dL — ABNORMAL HIGH (ref 70–99)

## 2017-10-09 LAB — SALICYLATE LEVEL

## 2017-10-09 LAB — BRAIN NATRIURETIC PEPTIDE: B Natriuretic Peptide: 43 pg/mL (ref 0.0–100.0)

## 2017-10-09 MED ORDER — AMMONIA AROMATIC IN INHA
RESPIRATORY_TRACT | Status: AC
  Administered 2017-10-09: 23:00:00
  Filled 2017-10-09: qty 20

## 2017-10-09 NOTE — ED Provider Notes (Signed)
[facility] Emergency Department Provider Note  ____________________________________________   First MD Initiated Contact with Patient 10/09/17 2305     (approximate)  I have reviewed the triage vital signs and the nursing notes.   HISTORY  Chief Complaint Arm Pain (left) and Leg Swelling (bilateral )  5 exemption history limited by the patient's clinical condition  HPI Henry Munoz is a 52 y.o. male who comes to the emergency department via EMS  for unclear reasons.  The patient himself says he did not call 911 and "I am not in a damn hospital".  Apparently earlier today the patient took 2 tabs of Percocet for unclear reasons.  When EMS arrived at home they noted an overwhelming smell of marijuana and the patient was quite obtunded.  They gave him 2 mg of naloxone with no effect.  The patient says that he thinks his brother might of called 911 but he is not sure why.  The patient himself has no complaints at this time.   Past Medical History:  Diagnosis Date  . Diabetes mellitus without complication (HCC)   . Hypertension   . Schizo-affective schizophrenia (HCC)   . TIA (transient ischemic attack)     Patient Active Problem List   Diagnosis Date Noted  . Acute encephalopathy 10/10/2017  . MDD (major depressive disorder) 08/12/2016  . 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 Tobacco use disorder 08/10/2016  . Cocaine use disorder, severe, dependence (HCC) 08/10/2016  . Alcohol use disorder, moderate, dependence (HCC) 08/10/2016  . Cerebrovascular accident (CVA) (HCC) 08/10/2016  . Diabetes (HCC) 04/27/2016  . Essential hypertension 02/19/2016    History reviewed. No pertinent surgical history.  Prior to Admission medications   Medication Sig Start Date End Date Taking? Authorizing Provider  amLODipine (NORVASC) 10 MG tablet Take 1 tablet (10 mg total) by mouth daily. 08/13/16   Jimmy Footman, MD  aspirin EC 81 MG EC tablet Take 1 tablet (81 mg total) by mouth daily. 08/13/16   Jimmy Footman, MD  atorvastatin (LIPITOR) 40 MG tablet Take 1 tablet (40 mg total) by mouth daily. 08/13/16   Jimmy Footman, MD  clopidogrel (PLAVIX) 75 MG tablet Take 1 tablet (75 mg total) by mouth daily. 08/13/16   Jimmy Footman, MD  hydrochlorothiazide (HYDRODIURIL) 50 MG tablet Take 1 tablet (50 mg total) by mouth daily. 08/13/16   Jimmy Footman, MD  insulin aspart protamine- aspart (NOVOLOG MIX 70/30) (70-30) 100 UNIT/ML injection Inject 0.11 mLs (11 Units total) into the skin daily with supper. 08/12/16   Jimmy Footman, MD  insulin aspart protamine- aspart (NOVOLOG MIX 70/30) (70-30) 100 UNIT/ML injection Inject 0.12 mLs (12 Units total) into the skin daily with breakfast. 08/13/16   Jimmy Footman, MD  metFORMIN (FORTAMET) 1000 MG (OSM) 24 hr tablet Take 1 tablet (1,000 mg total) by mouth 2 (two) times daily with a meal. 08/12/16   Jimmy Footman, MD  risperiDONE (RISPERDAL) 1 MG tablet Take 1 tablet (1 mg total) by mouth at bedtime. 08/12/16   Jimmy Footman, MD  sertraline (ZOLOFT) 50 MG tablet Take 1 tablet (50 mg total) by mouth daily. 08/13/16   Jimmy Footman, MD  traZODone (DESYREL) 150 MG tablet Take 1 tablet (150 mg total) by mouth at bedtime. 08/12/16   Jimmy Footman, MD    Allergies Haldol [haloperidol lactate]  No family history on file.  Social History Social History   Tobacco Use  . Smoking status: Current Every Day Smoker  Packs/day: 0.50    Types: Cigarettes  . Smokeless tobacco: Never Used  Substance Use Topics  . Alcohol use: Yes    Alcohol/week: 2.0 standard drinks    Types: 2 Cans of beer per week    Comment: per week  . Drug use: Yes    Types: Cocaine    Review of Systems Level 5 exemption history limited by the patient's clinical  condition ____________________________________________   PHYSICAL EXAM:  VITAL SIGNS: ED Triage Vitals [10/09/17 2216]  Enc Vitals Group     BP (!) 164/97     Pulse Rate 74     Resp 17     Temp 98.9 F (37.2 C)     Temp Source Oral     SpO2 100 %     Weight      Height      Head Circumference      Peak Flow      Pain Score      Pain Loc      Pain Edu?      Excl. in GC?     Constitutional: Somnolent and somewhat difficult to arouse.  He falls asleep when I am talking to him. Eyes: PERRL EOMI. midrange and sluggish Head: Atraumatic. Nose: No congestion/rhinnorhea. Mouth/Throat: No trismus Neck: No stridor.  No meningismus Cardiovascular: Normal rate, regular rhythm. Grossly normal heart sounds.  Good peripheral circulation. Respiratory: Normal respiratory effort.  No retractions. Lungs CTAB and moving good air Gastrointestinal: Obese soft nontender Musculoskeletal: Legs are equal in size.  3+ pitting edema to thighs bilaterally Neurologic: Moves all 4 Skin:  Skin is warm, dry and intact. No rash noted. Psychiatric: Somnolent  ____________________________________________   DIFFERENTIAL includes but not limited to  Cocaine washout, methamphetamine washout, alcohol intoxication, opioid overdose, marijuana intoxication, intracerebral hemorrhage, encephalitis ____________________________________________   LABS (all labs ordered are listed, but only abnormal results are displayed)  Labs Reviewed  GLUCOSE, CAPILLARY - Abnormal; Notable for the following components:      Result Value   Glucose-Capillary 155 (*)    All other components within normal limits  BASIC METABOLIC PANEL - Abnormal; Notable for the following components:   Glucose, Bld 133 (*)    BUN 23 (*)    All other components within normal limits  CBC - Abnormal; Notable for the following components:   RBC 3.72 (*)    Hemoglobin 11.9 (*)    HCT 34.8 (*)    RDW 15.3 (*)    All other components within  normal limits  URINALYSIS, COMPLETE (UACMP) WITH MICROSCOPIC - Abnormal; Notable for the following components:   Color, Urine YELLOW (*)    APPearance CLEAR (*)    Hgb urine dipstick SMALL (*)    Leukocytes, UA TRACE (*)    All other components within normal limits  URINE DRUG SCREEN, QUALITATIVE ([facility] ONLY) - Abnormal; Notable for the following components:   Opiate, Ur Screen POSITIVE (*)    Benzodiazepine, Ur Scrn TEST NOT PERFORMED, REAGENT NOT AVAILABLE (*)    All other components within normal limits  ACETAMINOPHEN LEVEL - Abnormal; Notable for the following components:   Acetaminophen (Tylenol), Serum <10 (*)    All other components within normal limits  BASIC METABOLIC PANEL - Abnormal; Notable for the following components:   Potassium 3.2 (*)    Glucose, Bld 128 (*)    BUN 21 (*)    Calcium 8.7 (*)    All other components within normal limits  CBC - Abnormal;  Notable for the following components:   RBC 3.19 (*)    Hemoglobin 10.2 (*)    HCT 29.9 (*)    RDW 14.9 (*)    All other components within normal limits  GLUCOSE, CAPILLARY - Abnormal; Notable for the following components:   Glucose-Capillary 122 (*)    All other components within normal limits  TROPONIN I  HEPATIC FUNCTION PANEL  BRAIN NATRIURETIC PEPTIDE  CK  SALICYLATE LEVEL  HIV ANTIBODY (ROUTINE TESTING)    Lab work reviewed by me with no clear etiology of the patient's symptoms identified __________________________________________  EKG   ____________________________________________  RADIOLOGY  Chest x-ray reviewed by me with low volumes otherwise unremarkable ____________________________________________   PROCEDURES  Procedure(s) performed: Yes  .Lumbar Puncture Date/Time: 10/10/2017 12:26 AM Performed by: Merrily Brittle, MD Authorized by: Merrily Brittle, MD   Consent:    Consent obtained:  Emergent situation   Consent given by:  Healthcare agent   Risks discussed:  Bleeding,  infection, headache, nerve damage, repeat procedure and pain   Alternatives discussed:  Alternative treatment Pre-procedure details:    Procedure purpose:  Diagnostic Anesthesia (see MAR for exact dosages):    Anesthesia method:  Local infiltration   Local anesthetic:  Lidocaine 1% w/o epi Procedure details:    Lumbar space:  L3-L4 interspace   Patient position:  L lateral decubitus   Needle gauge:  18   Needle length (in):  3.5   Number of attempts:  4 Post-procedure:    Patient tolerance of procedure:  Procedure terminated at patient's request Comments:     The patient is morbidly obese and the LP was technically challenging.  I attempted 4 times without success and terminated the procedure 2/2 patient discomfort    Critical Care performed: no  ____________________________________________   INITIAL IMPRESSION / ASSESSMENT AND PLAN / ED COURSE  Pertinent labs & imaging results that were available during my care of the patient were reviewed by me and considered in my medical decision making (see chart for details).   As part of my medical decision making, I reviewed the following data within the electronic MEDICAL RECORD NUMBER History obtained from family if available, nursing notes, old chart and ekg, as well as notes from prior ED visits.  The patient comes to the emergency department profoundly obtunded.  He does wake to painful stimulus but falls asleep quickly while on talking to him.  His clinical condition seems consistent with cocaine or amphetamine washout.  Blood sugar is normal.  Broad labs are pending.  The patient's labs are largely unremarkable.  I appreciate that his temperature is only 99.4 degrees however I do have some concern for encephalopathy.  Head CT is unremarkable salt attempt lumbar puncture.  Patient's morbidly obese and lumbar puncture was technically difficult.  I was unable to get CSF.  At this point as the patient remains obtunded he will require  inpatient admission for full work-up and evaluation.      ____________________________________________   FINAL CLINICAL IMPRESSION(S) / ED DIAGNOSES  Final diagnoses:  Altered mental status, unspecified altered mental status type      NEW MEDICATIONS STARTED DURING THIS VISIT:  Current Discharge Medication List       Note:  This document was prepared using Dragon voice recognition software and may include unintentional dictation errors.     Merrily Brittle, MD 10/10/17 [phone]

## 2017-10-09 NOTE — ED Triage Notes (Signed)
Pt bib [facility] from home where EMS states cc: left arm pain, no injury, and +2 edema in bilateral legs. Pt denies meds for edema. Pt lethargic, but per EMS pt states only 2 percocets per usual and they normally cause him to "be out like this"- per pt. Pt received 2mg  narcan IV, no change in  Mentation. Ems reports strong marijuana odor in home where pt was. Hx dm, htn, tia 2 years ago.   cbg 155 at arrival

## 2017-10-10 ENCOUNTER — Other Ambulatory Visit: Payer: Self-pay

## 2017-10-10 DIAGNOSIS — F1110 Opioid abuse, uncomplicated: Secondary | ICD-10-CM

## 2017-10-10 DIAGNOSIS — Z7982 Long term (current) use of aspirin: Secondary | ICD-10-CM | POA: Diagnosis not present

## 2017-10-10 DIAGNOSIS — I69318 Other symptoms and signs involving cognitive functions following cerebral infarction: Secondary | ICD-10-CM | POA: Diagnosis not present

## 2017-10-10 DIAGNOSIS — I69354 Hemiplegia and hemiparesis following cerebral infarction affecting left non-dominant side: Secondary | ICD-10-CM | POA: Diagnosis not present

## 2017-10-10 DIAGNOSIS — G9340 Encephalopathy, unspecified: Secondary | ICD-10-CM | POA: Diagnosis present

## 2017-10-10 DIAGNOSIS — R4182 Altered mental status, unspecified: Secondary | ICD-10-CM | POA: Diagnosis present

## 2017-10-10 DIAGNOSIS — I10 Essential (primary) hypertension: Secondary | ICD-10-CM | POA: Diagnosis present

## 2017-10-10 DIAGNOSIS — Z7902 Long term (current) use of antithrombotics/antiplatelets: Secondary | ICD-10-CM | POA: Diagnosis not present

## 2017-10-10 DIAGNOSIS — Z5321 Procedure and treatment not carried out due to patient leaving prior to being seen by health care provider: Secondary | ICD-10-CM | POA: Diagnosis present

## 2017-10-10 DIAGNOSIS — Z79899 Other long term (current) drug therapy: Secondary | ICD-10-CM | POA: Diagnosis not present

## 2017-10-10 DIAGNOSIS — Z794 Long term (current) use of insulin: Secondary | ICD-10-CM | POA: Diagnosis not present

## 2017-10-10 DIAGNOSIS — Z6837 Body mass index (BMI) 37.0-37.9, adult: Secondary | ICD-10-CM | POA: Diagnosis not present

## 2017-10-10 DIAGNOSIS — T50905A Adverse effect of unspecified drugs, medicaments and biological substances, initial encounter: Secondary | ICD-10-CM | POA: Diagnosis present

## 2017-10-10 DIAGNOSIS — Y92009 Unspecified place in unspecified non-institutional (private) residence as the place of occurrence of the external cause: Secondary | ICD-10-CM | POA: Diagnosis not present

## 2017-10-10 DIAGNOSIS — E860 Dehydration: Secondary | ICD-10-CM | POA: Diagnosis present

## 2017-10-10 DIAGNOSIS — E119 Type 2 diabetes mellitus without complications: Secondary | ICD-10-CM | POA: Diagnosis present

## 2017-10-10 DIAGNOSIS — E785 Hyperlipidemia, unspecified: Secondary | ICD-10-CM | POA: Diagnosis present

## 2017-10-10 DIAGNOSIS — F250 Schizoaffective disorder, bipolar type: Secondary | ICD-10-CM | POA: Diagnosis present

## 2017-10-10 DIAGNOSIS — Z888 Allergy status to other drugs, medicaments and biological substances status: Secondary | ICD-10-CM | POA: Diagnosis not present

## 2017-10-10 DIAGNOSIS — F17210 Nicotine dependence, cigarettes, uncomplicated: Secondary | ICD-10-CM | POA: Diagnosis present

## 2017-10-10 DIAGNOSIS — G92 Toxic encephalopathy: Secondary | ICD-10-CM | POA: Diagnosis present

## 2017-10-10 LAB — URINE DRUG SCREEN, QUALITATIVE (ARMC ONLY)
AMPHETAMINES, UR SCREEN: NOT DETECTED
Barbiturates, Ur Screen: NOT DETECTED
CANNABINOID 50 NG, UR ~~LOC~~: NOT DETECTED
Cocaine Metabolite,Ur ~~LOC~~: NOT DETECTED
MDMA (ECSTASY) UR SCREEN: NOT DETECTED
METHADONE SCREEN, URINE: NOT DETECTED
Opiate, Ur Screen: POSITIVE — AB
Phencyclidine (PCP) Ur S: NOT DETECTED
Tricyclic, Ur Screen: NOT DETECTED

## 2017-10-10 LAB — BASIC METABOLIC PANEL
ANION GAP: 5 (ref 5–15)
BUN: 21 mg/dL — ABNORMAL HIGH (ref 6–20)
CHLORIDE: 109 mmol/L (ref 98–111)
CO2: 27 mmol/L (ref 22–32)
Calcium: 8.7 mg/dL — ABNORMAL LOW (ref 8.9–10.3)
Creatinine, Ser: 0.78 mg/dL (ref 0.61–1.24)
GFR calc non Af Amer: >60 mL/min (ref >60)
Glucose, Bld: 128 mg/dL — ABNORMAL HIGH (ref 70–99)
POTASSIUM: 3.2 mmol/L — AB (ref 3.5–5.1)
Sodium: 141 mmol/L (ref 135–145)

## 2017-10-10 LAB — CBC
HCT: 29.9 % — ABNORMAL LOW (ref 40.0–52.0)
HEMOGLOBIN: 10.2 g/dL — AB (ref 13.0–18.0)
MCH: 32 pg (ref 26.0–34.0)
MCHC: 34.2 g/dL (ref 32.0–36.0)
MCV: 93.7 fL (ref 80.0–100.0)
Platelets: 248 10*3/uL (ref 150–440)
RBC: 3.19 MIL/uL — ABNORMAL LOW (ref 4.40–5.90)
RDW: 14.9 % — ABNORMAL HIGH (ref 11.5–14.5)
WBC: 4.9 10*3/uL (ref 3.8–10.6)

## 2017-10-10 LAB — GLUCOSE, CAPILLARY
Glucose-Capillary: 122 mg/dL — ABNORMAL HIGH (ref 70–99)
Glucose-Capillary: 108 mg/dL — ABNORMAL HIGH (ref 70–99)
Glucose-Capillary: 96 mg/dL (ref 70–99)
Glucose-Capillary: 88 mg/dL (ref 70–99)

## 2017-10-10 MED ORDER — SERTRALINE HCL 50 MG PO TABS
50.0000 mg | ORAL_TABLET | Freq: Every day | ORAL | Status: DC
  Administered 2017-10-10: 50 mg via ORAL
  Filled 2017-10-10: qty 1

## 2017-10-10 MED ORDER — ATORVASTATIN CALCIUM 20 MG PO TABS
40.0000 mg | ORAL_TABLET | Freq: Every day | ORAL | Status: DC
  Administered 2017-10-10: 40 mg via ORAL
  Filled 2017-10-10: qty 2

## 2017-10-10 MED ORDER — SODIUM CHLORIDE 0.9 % IV SOLN
INTRAVENOUS | Status: DC
  Administered 2017-10-10 (×2): via INTRAVENOUS

## 2017-10-10 MED ORDER — CLOPIDOGREL BISULFATE 75 MG PO TABS
75.0000 mg | ORAL_TABLET | Freq: Every day | ORAL | Status: DC
  Administered 2017-10-10: 75 mg via ORAL
  Filled 2017-10-10: qty 1

## 2017-10-10 MED ORDER — ASPIRIN EC 81 MG PO TBEC
81.0000 mg | DELAYED_RELEASE_TABLET | Freq: Every day | ORAL | Status: DC
  Administered 2017-10-10: 81 mg via ORAL
  Filled 2017-10-10: qty 1

## 2017-10-10 MED ORDER — POTASSIUM CHLORIDE CRYS ER 20 MEQ PO TBCR: 40.0000 meq | EXTENDED_RELEASE_TABLET | Freq: Once | ORAL | Status: DC

## 2017-10-10 MED ORDER — ACETAMINOPHEN 325 MG PO TABS: 650.0000 mg | ORAL_TABLET | Freq: Four times a day (QID) | ORAL | Status: DC | PRN

## 2017-10-10 MED ORDER — ONDANSETRON HCL 4 MG PO TABS: 4.0000 mg | ORAL_TABLET | Freq: Four times a day (QID) | ORAL | Status: DC | PRN

## 2017-10-10 MED ORDER — HEPARIN SODIUM (PORCINE) 5000 UNIT/ML IJ SOLN
5000.0000 [IU] | Freq: Three times a day (TID) | INTRAMUSCULAR | Status: DC
  Administered 2017-10-10 (×2): 5000 [IU] via SUBCUTANEOUS
  Filled 2017-10-10: qty 1

## 2017-10-10 MED ORDER — ACETAMINOPHEN 650 MG RE SUPP: 650.0000 mg | Freq: Four times a day (QID) | RECTAL | Status: DC | PRN

## 2017-10-10 MED ORDER — ONDANSETRON HCL 4 MG/2ML IJ SOLN: 4.0000 mg | Freq: Four times a day (QID) | INTRAMUSCULAR | Status: DC | PRN

## 2017-10-10 MED ORDER — SERTRALINE HCL 50 MG PO TABS: 50.0000 mg | ORAL_TABLET | Freq: Every day | ORAL | Status: DC

## 2017-10-10 MED ORDER — INSULIN ASPART 100 UNIT/ML ~~LOC~~ SOLN: 0.0000 [IU] | Freq: Three times a day (TID) | SUBCUTANEOUS | Status: DC

## 2017-10-10 MED ORDER — INSULIN ASPART 100 UNIT/ML ~~LOC~~ SOLN: 0.0000 [IU] | Freq: Every day | SUBCUTANEOUS | Status: DC

## 2017-10-10 MED ORDER — DOCUSATE SODIUM 100 MG PO CAPS
100.0000 mg | ORAL_CAPSULE | Freq: Two times a day (BID) | ORAL | Status: DC
  Administered 2017-10-10: 100 mg via ORAL
  Filled 2017-10-10: qty 1

## 2017-10-10 MED ORDER — INSULIN ASPART PROT & ASPART (70-30 MIX) 100 UNIT/ML ~~LOC~~ SUSP: 8.0000 [IU] | Freq: Every day | SUBCUTANEOUS | Status: DC

## 2017-10-10 MED ORDER — SODIUM CHLORIDE 0.9 % IV BOLUS
1000.0000 mL | Freq: Once | INTRAVENOUS | Status: AC
  Administered 2017-10-10: 1000 mL via INTRAVENOUS

## 2017-10-10 MED ORDER — HYDROCODONE-ACETAMINOPHEN 5-325 MG PO TABS: 1.0000 | ORAL_TABLET | ORAL | Status: DC | PRN

## 2017-10-10 MED ORDER — INSULIN ASPART PROT & ASPART (70-30 MIX) 100 UNIT/ML ~~LOC~~ SUSP
9.0000 [IU] | Freq: Every day | SUBCUTANEOUS | Status: DC
  Administered 2017-10-10: 9 [IU] via SUBCUTANEOUS
  Filled 2017-10-10: qty 10

## 2017-10-10 MED ORDER — BISACODYL 5 MG PO TBEC: 5.0000 mg | DELAYED_RELEASE_TABLET | Freq: Every day | ORAL | Status: DC | PRN

## 2017-10-10 MED ORDER — AMLODIPINE BESYLATE 10 MG PO TABS
10.0000 mg | ORAL_TABLET | Freq: Every day | ORAL | Status: DC
  Administered 2017-10-10: 10 mg via ORAL
  Filled 2017-10-10: qty 1

## 2017-10-10 NOTE — H&P (Signed)
[facility] -  at [facility]   PATIENT NAME: Henry Munoz    MR#:  [id number]  DATE OF BIRTH:  28-Feb-1965  DATE OF ADMISSION:  10/09/2017  PRIMARY CARE PHYSICIAN: System, Pcp Not In   REQUESTING/REFERRING PHYSICIAN:   CHIEF COMPLAINT:   Chief Complaint  Patient presents with  . Arm Pain    left  . Leg Swelling    bilateral     HISTORY OF PRESENT ILLNESS: Jamaine Lahner  is a 52 y.o. male with a known history of diabetes type 2, schizophrenia, bipolar disorder, hypertension, TIA. Patient is currently very drowsy, unable to provide any history.  This information was taken from reviewing the medical chart and from discussion with emergency room physician. Apparently, he was brought to emergency room for altered mental status/severe drowsiness.  It is unclear who called the paramedics but, per EMS notes, he was found at home very obtunded.  The room had a strong smell of recent marijuana use.  Patient is home psych meds and he also took 2 tablets of Percocet a few hours ago.  He was given 2 mg of naloxone without any improvement. I do not know if patient was started on any new medication, especially psych medications. Blood test done emergency room revealed a glucose level at 133, creatinine level is 0.94, WBC at 5.9, hemoglobin at 11.9.  UA is negative for bacteria.  Chest x-ray is negative for any acute cardiopulmonary process. Head CT is negative for any acute intracranial abnormalities.  Patient is admitted for further evaluation and treatment.   PAST MEDICAL HISTORY:   Past Medical History:  Diagnosis Date  . Diabetes mellitus without complication (HCC)   . Hypertension   . Schizo-affective schizophrenia (HCC)   . TIA (transient ischemic attack)     PAST SURGICAL HISTORY: History reviewed. No pertinent surgical history.  SOCIAL HISTORY:  Social History   Tobacco Use  . Smoking status: Current Every Day Smoker    Packs/day: 0.50    Types:  Cigarettes  . Smokeless tobacco: Never Used  Substance Use Topics  . Alcohol use: Yes    Alcohol/week: 2.0 standard drinks    Types: 2 Cans of beer per week    Comment: per week    FAMILY HISTORY: No family history on file.  DRUG ALLERGIES:  Allergies  Allergen Reactions  . Haldol [Haloperidol Lactate]     REVIEW OF SYSTEMS:   Unable to obtain due to patient being very obtunded.  MEDICATIONS AT HOME:  Prior to Admission medications   Medication Sig Start Date End Date Taking? Authorizing Provider  amLODipine (NORVASC) 10 MG tablet Take 1 tablet (10 mg total) by mouth daily. 08/13/16   Jimmy Footman, MD  aspirin EC 81 MG EC tablet Take 1 tablet (81 mg total) by mouth daily. 08/13/16   Jimmy Footman, MD  atorvastatin (LIPITOR) 40 MG tablet Take 1 tablet (40 mg total) by mouth daily. 08/13/16   Jimmy Footman, MD  clopidogrel (PLAVIX) 75 MG tablet Take 1 tablet (75 mg total) by mouth daily. 08/13/16   Jimmy Footman, MD  hydrochlorothiazide (HYDRODIURIL) 50 MG tablet Take 1 tablet (50 mg total) by mouth daily. 08/13/16   Jimmy Footman, MD  insulin aspart protamine- aspart (NOVOLOG MIX 70/30) (70-30) 100 UNIT/ML injection Inject 0.11 mLs (11 Units total) into the skin daily with supper. 08/12/16   Jimmy Footman, MD  insulin aspart protamine- aspart (NOVOLOG MIX 70/30) (70-30) 100 UNIT/ML injection Inject 0.12 mLs (12  Units total) into the skin daily with breakfast. 08/13/16   Jimmy Footman, MD  metFORMIN (FORTAMET) 1000 MG (OSM) 24 hr tablet Take 1 tablet (1,000 mg total) by mouth 2 (two) times daily with a meal. 08/12/16   Jimmy Footman, MD  risperiDONE (RISPERDAL) 1 MG tablet Take 1 tablet (1 mg total) by mouth at bedtime. 08/12/16   Jimmy Footman, MD  sertraline (ZOLOFT) 50 MG tablet Take 1 tablet (50 mg total) by mouth daily. 08/13/16   Jimmy Footman, MD  traZODone (DESYREL)  150 MG tablet Take 1 tablet (150 mg total) by mouth at bedtime. 08/12/16   Jimmy Footman, MD      PHYSICAL EXAMINATION:   VITAL SIGNS: Blood pressure 129/79, pulse (!) 59, temperature 99.4 F (37.4 C), temperature source Rectal, resp. rate 15, SpO2 100 %.  GENERAL:  52 y.o.-year-old patient lying in the bed with no acute distress, sleeping.  He wakes up, if called, but remains very drowsy EYES: Pupils equal, round, reactive to light and accommodation. No scleral icterus.  HEENT: Head atraumatic, normocephalic.   NECK:  Supple, no jugular venous distention. No thyroid enlargement, no tenderness.  LUNGS: Normal breath sounds bilaterally, no wheezing, rales,rhonchi or crepitation. No use of accessory muscles of respiration.  CARDIOVASCULAR: S1, S2 normal. No S3/S4.  ABDOMEN: Soft, nontender, nondistended. Bowel sounds present. No organomegaly or mass.  EXTREMITIES: No pedal edema, cyanosis, or clubbing.  NEUROLOGIC EXAM: Is very limited, due to patient being very obtunded.  He seems to be moving all his extremities, no focal weakness appreciated. PSYCHIATRIC: The patient is very drowsy, but arousable.  SKIN: No obvious rash, lesion, or ulcer.   LABORATORY PANEL:   CBC Recent Labs  Lab 10/09/17 2219  WBC 5.9  HGB 11.9*  HCT 34.8*  PLT 280  MCV 93.5  MCH 31.9  MCHC 34.1  RDW 15.3*   ------------------------------------------------------------------------------------------------------------------  Chemistries  Recent Labs  Lab 10/09/17 2219  NA 140  K 3.7  CL 105  CO2 29  GLUCOSE 133*  BUN 23*  CREATININE 0.94  CALCIUM 9.3  AST 21  ALT 34  ALKPHOS 102  BILITOT 0.5   ------------------------------------------------------------------------------------------------------------------ CrCl cannot be calculated (Unknown ideal weight.). ------------------------------------------------------------------------------------------------------------------ No results  for input(s): TSH, T4TOTAL, T3FREE, THYROIDAB in the last 72 hours.  Invalid input(s): FREET3   Coagulation profile No results for input(s): INR, PROTIME in the last 168 hours. ------------------------------------------------------------------------------------------------------------------- No results for input(s): DDIMER in the last 72 hours. -------------------------------------------------------------------------------------------------------------------  Cardiac Enzymes Recent Labs  Lab 10/09/17 2219  TROPONINI <0.03   ------------------------------------------------------------------------------------------------------------------ Invalid input(s): POCBNP  ---------------------------------------------------------------------------------------------------------------  Urinalysis    Component Value Date/Time   COLORURINE YELLOW (A) 10/09/2017 2019   APPEARANCEUR CLEAR (A) 10/09/2017 2019   LABSPEC 1.026 10/09/2017 2019   PHURINE 5.0 10/09/2017 2019   GLUCOSEU NEGATIVE 10/09/2017 2019   HGBUR SMALL (A) 10/09/2017 2019   BILIRUBINUR NEGATIVE 10/09/2017 2019   KETONESUR NEGATIVE 10/09/2017 2019   PROTEINUR NEGATIVE 10/09/2017 2019   NITRITE NEGATIVE 10/09/2017 2019   LEUKOCYTESUR TRACE (A) 10/09/2017 2019     RADIOLOGY: Ct Head Wo Contrast  Result Date: 10/10/2017 CLINICAL DATA:  52 year old male with altered mental status. EXAM: CT HEAD WITHOUT CONTRAST TECHNIQUE: Contiguous axial images were obtained from the base of the skull through the vertex without intravenous contrast. COMPARISON:  Brain MRI dated 01/30/2016 and head CT dated 01/30/2016 FINDINGS: Evaluation of this exam is limited due to motion artifact. Brain: Large area of old infarct involving the right MCA territory  as seen on the prior CT. There is associated mild ex vacuo dilatation of the right lateral ventricle. Areas of hypodensity involving bilateral basal ganglia are age indeterminate but likely chronic.  Acute or subacute infarct is less likely but not entirely excluded. Clinical correlation is recommended. If there is high clinical concern for acute ischemia further evaluation with MRI is recommended. There is diffuse chronic microvascular ischemic changes. There is no acute intracranial hemorrhage. No mass effect or midline shift. No extra-axial fluid collection. Vascular: No hyperdense vessel or unexpected calcification. Skull: Normal. Negative for fracture or focal lesion. Sinuses/Orbits: No acute finding. Other: None IMPRESSION: 1. No acute intracranial hemorrhage. 2. 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 3. Bilateral basal ganglia low attenuation, likely chronic. If there is clinical concern for acute ischemia, further evaluation with MRI is recommended. Electronically Signed   By: Elgie Collard M.D.   On: 10/10/2017 00:23   Dg Chest Port 1 View  Result Date: 10/09/2017 CLINICAL DATA:  52 year old male with history of left-sided arm pain. No history of injury. Edema and legs bilaterally. EXAM: PORTABLE CHEST 1 VIEW COMPARISON:  Chest x-ray 01/30/2016. FINDINGS: Lung volumes are low. No consolidative airspace disease. No pleural effusions. No pneumothorax. No pulmonary nodule or mass noted. Pulmonary vasculature and the cardiomediastinal silhouette are within normal limits. IMPRESSION: 1. Low lung volumes without radiographic evidence of acute cardiopulmonary disease. Electronically Signed   By: Trudie Reed M.D.   On: 10/09/2017 23:26    EKG: Orders placed or performed during the hospital encounter of 10/09/17  . ED EKG within 10 minutes  . ED EKG within 10 minutes    IMPRESSION AND PLAN:  1.  Acute encephalopathy, likely related to medications side effects.  We will continue to monitor clinically closely.  We will start IV fluids and will hold medications with sedative effects. 2.  Diabetes type 2.  We will  monitor blood sugars before meals and at bedtime and use insulin treatment during the hospital stay. 3.  Hypertension, stable, will restart home medications. 4.  Hyperlipidemia, on statin. 5.  Schizophrenia and bipolar disorder.  Will consult psychiatry for further evaluation and treatment.  It is unclear what kind of psych meds is patient currently taking, but is possible that his psych meds are causing excessive drowsiness, especially in combination with narcotics.  Per old medication list from EMR, it seems that patient has been taking Risperdal, Zoloft and trazodone.  Will hold these medications for now, until patient becomes alert.  All the records are reviewed and case discussed with ED provider.   CODE STATUS: Full Code Status History    Date Active Date Inactive Code Status Order ID Comments User Context   08/09/2016 1818 08/13/2016 1753 Full Code [id number]  Audery Amel, MD Inpatient   01/30/2016 1404 02/01/2016 1357 Full Code [id number]  Adrian Saran, MD ED       TOTAL TIME TAKING CARE OF THIS PATIENT: 45 minutes.    Cammy Copa M.D on 10/10/2017 at 1:17 AM  Between 7am to 6pm - Pager - [phone]  After 6pm go to [email or web address] - password [email or web address] [facility] [place] at [facility]  [phone]  CC: Primary care physician; System, Pcp Not In

## 2017-10-10 NOTE — Progress Notes (Addendum)
[facility]   PATIENT NAME: Henry Munoz    MR#:  [id number]  DATE OF BIRTH:  1965-11-29  SUBJECTIVE:  CHIEF COMPLAINT:   Chief Complaint  Patient presents with  . Arm Pain    left  . Leg Swelling    bilateral   Patient seen today Responds to verbal commands but drifts back to sleep No shortness of breath No fever  REVIEW OF SYSTEMS:    ROS  CONSTITUTIONAL: No documented fever. Has fatigue, weakness. No weight gain, no weight loss.  EYES: No blurry or double vision.  ENT: No tinnitus. No postnasal drip. No redness of the oropharynx.  RESPIRATORY: No cough, no wheeze, no hemoptysis. No dyspnea.  CARDIOVASCULAR: No chest pain. No orthopnea. No palpitations. No syncope.  GASTROINTESTINAL: No nausea, no vomiting or diarrhea. No abdominal pain. No melena or hematochezia.  GENITOURINARY: No dysuria or hematuria.  ENDOCRINE: No polyuria or nocturia. No heat or cold intolerance.  HEMATOLOGY: No anemia. No bruising. No bleeding.  INTEGUMENTARY: No rashes. No lesions.  MUSCULOSKELETAL: No arthritis. No swelling. No gout.  NEUROLOGIC: No numbness, tingling, or ataxia. No seizure-type activity.  PSYCHIATRIC: No anxiety. No insomnia. No ADD.   DRUG ALLERGIES:   Allergies  Allergen Reactions  . Haldol [Haloperidol Lactate]     VITALS:  Blood pressure (!) 176/96, pulse 61, temperature 97.9 F (36.6 C), temperature source Oral, resp. rate 16, weight 107.5 kg, SpO2 97 %.  PHYSICAL EXAMINATION:   Physical Exam  GENERAL:  52 y.o.-year-old patient lying in the bed with no acute distress.  EYES: Pupils equal, round, reactive to light and accommodation. No scleral icterus. Extraocular muscles intact.  HEENT: Head atraumatic, normocephalic. Oropharynx and nasopharynx clear.  NECK:  Supple, no jugular venous distention. No thyroid enlargement, no tenderness.  LUNGS: Normal breath sounds bilaterally, no wheezing, rales, rhonchi. No use of  accessory muscles of respiration.  CARDIOVASCULAR: S1, S2 normal. No murmurs, rubs, or gallops.  ABDOMEN: Soft, nontender, nondistended. Bowel sounds present. No organomegaly or mass.  EXTREMITIES: No cyanosis, clubbing or edema b/l.    NEUROLOGIC: Cranial nerves II through XII are intact. No focal Motor or sensory deficits b/l.   PSYCHIATRIC: The patient is alert and oriented x 3.  SKIN: No obvious rash, lesion, or ulcer.   LABORATORY PANEL:   CBC Recent Labs  Lab 10/10/17 0357  WBC 4.9  HGB 10.2*  HCT 29.9*  PLT 248   ------------------------------------------------------------------------------------------------------------------ Chemistries  Recent Labs  Lab 10/09/17 2219 10/10/17 0357  NA 140 141  K 3.7 3.2*  CL 105 109  CO2 29 27  GLUCOSE 133* 128*  BUN 23* 21*  CREATININE 0.94 0.78  CALCIUM 9.3 8.7*  AST 21  --   ALT 34  --   ALKPHOS 102  --   BILITOT 0.5  --    ------------------------------------------------------------------------------------------------------------------  Cardiac Enzymes Recent Labs  Lab 10/09/17 2219  TROPONINI <0.03   ------------------------------------------------------------------------------------------------------------------  RADIOLOGY:  Ct Head Wo Contrast  Result Date: 10/10/2017 CLINICAL DATA:  52 year old male with altered mental status. EXAM: CT HEAD WITHOUT CONTRAST TECHNIQUE: Contiguous axial images were obtained from the base of the skull through the vertex without intravenous contrast. COMPARISON:  Brain MRI dated 01/30/2016 and head CT dated 01/30/2016 FINDINGS: Evaluation of this exam is limited due to motion artifact. 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 There is associated mild ex vacuo dilatation of the  right lateral ventricle. Areas of hypodensity involving bilateral basal ganglia are age indeterminate but likely chronic. Acute or subacute infarct is less likely  but not entirely excluded. Clinical correlation is recommended. If there is high clinical concern for acute ischemia further evaluation with MRI is recommended. There is diffuse chronic microvascular ischemic changes. There is no acute intracranial hemorrhage. No mass effect or midline shift. No extra-axial fluid collection. Vascular: No hyperdense vessel or unexpected calcification. Skull: Normal. Negative for fracture or focal lesion. Sinuses/Orbits: No acute finding. Other: None IMPRESSION: 1. No acute intracranial hemorrhage. 2. 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 3. Bilateral basal ganglia low attenuation, likely chronic. If there is clinical concern for acute ischemia, further evaluation with MRI is recommended. Electronically Signed   By: Elgie Collard M.D.   On: 10/10/2017 00:23   Dg Chest Port 1 View  Result Date: 10/09/2017 CLINICAL DATA:  52 year old male with history of left-sided arm pain. No history of injury. Edema and legs bilaterally. EXAM: PORTABLE CHEST 1 VIEW COMPARISON:  Chest x-ray 01/30/2016. FINDINGS: Lung volumes are low. No consolidative airspace disease. No pleural effusions. No pneumothorax. No pulmonary nodule or mass noted. Pulmonary vasculature and the cardiomediastinal silhouette are within normal limits. IMPRESSION: 1. Low lung volumes without radiographic evidence of acute cardiopulmonary disease. Electronically Signed   By: Trudie Reed M.D.   On: 10/09/2017 23:26     ASSESSMENT AND PLAN:   52 year old male patient with history of schizophrenia, bipolar disorder, hypertension, type 2 diabetes mellitus, TIA in the past currently under hospitalist service for encephalopathy  -Acute encephalopathy Probably medication induced (sedative meds) Sedative medications on hold CT head no acute abnormality  -Dehydration IV fluids  -Schizophrenia and bipolar disorder Follow-up psychiatric  consult for advice on his medications and dosing Was on Risperdal, Zoloft and trazodone at home Currently on oral zoloft  -Hyperlipidemia Continue statin  -Hypertension Continue Norvasc  -PT evaluation    All the records are reviewed and case discussed with Care Management/Social Worker. Management plans discussed with the patient, family and they are in agreement.  CODE STATUS: Full code  DVT Prophylaxis: SCDs  TOTAL TIME TAKING CARE OF THIS PATIENT: 45 minutes.   POSSIBLE D/C IN 2 to 3 DAYS, DEPENDING ON CLINICAL CONDITION.  Ihor Austin M.D on 10/10/2017 at 12:45 PM  Between 7am to 6pm - Pager - [phone]  After 6pm go to [email or web address] - password [email or web address]  [facility]  Office  [phone]  CC: Primary care physician; System, Pcp Not In  Note: This dictation was prepared with Dragon dictation along with smaller phrase technology. Any transcriptional errors that result from this process are unintentional.

## 2017-10-10 NOTE — Progress Notes (Signed)
Pt admitted to room 249, pt is lethargic, answers questions and goes right back to sleep. A&O x2. Will continue to monitor.

## 2017-10-10 NOTE — Consult Note (Signed)
[facility] Consult   Reason for Consult: Consult for this 52 year old man with a history of substance abuse problems who came into the hospital with altered mental status Referring Physician: Pyreddy Patient Identification: Henry Munoz MRN:  [id number] Principal Diagnosis: Acute encephalopathy Diagnosis:   Patient Active Problem List   Diagnosis Date Noted  . Acute encephalopathy [G93.40] 10/10/2017  . Opiate abuse, episodic ([place]) [F11.10] 10/10/2017  . MDD (major depressive disorder) [F32.9] 08/12/2016  . 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 Tobacco use disorder [F17.200] 08/10/2016  . Cocaine use disorder, severe, dependence ([place]) [F14.20] 08/10/2016  . Alcohol use disorder, moderate, dependence ([place]) [F10.20] 08/10/2016  . Cerebrovascular accident (CVA) ([place]) [I63.9] 08/10/2016  . Diabetes ([place]) [E11.9] 04/27/2016  . Essential hypertension [I10] 02/19/2016    Total Time spent with patient: 1 hour  Subjective:   Henry Munoz is a 52 y.o. male patient admitted with "my left side was hurting".  HPI: Patient interviewed chart reviewed.  Patient tells me he came into the hospital because his left side was hurting.  He says his left side has been in pain for years and it is a chronic issue ever since he had his stroke but that yesterday it was more sore than usual which he attributes to having "done a lot of work".  He says that he took 1 Percocet at home but that it was still hurting which is why he came to the hospital.  Chart indicates that when he was picked up at home he not only had been complaining of pain in the left side but also was having significant edema and appeared to have altered mental status.  Unable to give lucid history on presentation.  Had only a slight response apparently to Narcan.  She did not denies using any other drugs.  Denies having taken any medicine except the Percocet that would  make him sleepy although he thinks that is all that he would require.  He says his mood generally has been good.  Denies any depression.  Denies suicidal or homicidal thought.  Denies any hallucinations.  He says that he does continue to go to [facility] and takes Seroquel but also at one point mentions risperidone.  Social history: Lives with his mother and father.  Does not work.  Spends most of his time at home.  Medical history: History of a stroke with persistent left-sided weakness.  Diabetes high blood pressure.  There is a note in the chart that indicates he has a lot of [facility] probably suggesting he is not keeping up with medical care very well.  Substance abuse history: History of abuse of alcohol and cocaine in the past.  Current drug screen is positive only for opiates.  2 things I would mention.  First of all, Percocets do not usually show up as positive on the opiate drug screen because they are oxycodone.  Secondly there is no indication in the controlled substance database that he is prescribed any controlled substances at all.  Past Psychiatric History: Patient has a previous history of a diagnosis of schizophrenia although it looks like the last time he was here in the hospital Dr. Jerilee Hoh was questioning some of that and thought that most of it was just substance abuse.  No known history of suicide attempts or violence.  Risk to Self:   Risk to Others:   Prior Inpatient Therapy:   Prior Outpatient Therapy:  Past Medical History:  Past Medical History:  Diagnosis Date  . Diabetes mellitus without complication ([place])   . Hypertension   . Schizo-affective schizophrenia ([place])   . TIA (transient ischemic attack)    History reviewed. No pertinent surgical history. Family History: No family history on file. Family Psychiatric  History: None known Social History:  Social History   Substance and Sexual Activity  Alcohol Use Yes  . Alcohol/week:  2.0 standard drinks  . Types: 2 Cans of beer per week   Comment: per week     Social History   Substance and Sexual Activity  Drug Use Yes  . Types: Cocaine    Social History   Socioeconomic History  . Marital status: Single    Spouse name: Not on file  . Number of children: Not on file  . Years of education: Not on file  . Highest education level: Not on file  Occupational History  . Not on file  Social Needs  . Financial resource strain: Not on file  . Food insecurity:    Worry: Not on file    Inability: Not on file  . Transportation needs:    Medical: Not on file    Non-medical: Not on file  Tobacco Use  . Smoking status: Current Every Day Smoker    Packs/day: 0.50    Types: Cigarettes  . Smokeless tobacco: Never Used  Substance and Sexual Activity  . Alcohol use: Yes    Alcohol/week: 2.0 standard drinks    Types: 2 Cans of beer per week    Comment: per week  . Drug use: Yes    Types: Cocaine  . Sexual activity: Not on file  Lifestyle  . Physical activity:    Days per week: Not on file    Minutes per session: Not on file  . Stress: Not on file  Relationships  . Social connections:    Talks on phone: Not on file    Gets together: Not on file    Attends religious service: Not on file    Active member of club or organization: Not on file    Attends meetings of clubs or organizations: Not on file    Relationship status: Not on file  Other Topics Concern  . Not on file  Social History Narrative  . Not on file   Additional Social History:    Allergies:   Allergies  Allergen Reactions  . Haldol [Haloperidol Lactate]     Labs:  Results for orders placed or performed during the hospital encounter of 10/09/17 (from the past 48 hour(s))  Urinalysis, Complete w Microscopic     Status: Abnormal   Collection Time: 10/09/17  8:19 PM  Result Value Ref Range   Color, Urine YELLOW (A) YELLOW   APPearance CLEAR (A) CLEAR   Specific Gravity, Urine 1.026 1.005  - 1.030   pH 5.0 5.0 - 8.0   Glucose, UA NEGATIVE NEGATIVE mg/dL   Hgb urine dipstick SMALL (A) NEGATIVE   Bilirubin Urine NEGATIVE NEGATIVE   Ketones, ur NEGATIVE NEGATIVE mg/dL   Protein, ur NEGATIVE NEGATIVE mg/dL   Nitrite NEGATIVE NEGATIVE   Leukocytes, UA TRACE (A) NEGATIVE   RBC / HPF 6-10 0 - 5 RBC/hpf   WBC, UA 21-50 0 - 5 WBC/hpf   Bacteria, UA NONE SEEN NONE SEEN   Squamous Epithelial / LPF 0-5 0 - 5   Mucus PRESENT     Comment: Performed at [facility], [address] [place], [place] [address]  Glucose, capillary     Status: Abnormal   Collection Time: 10/09/17 10:10 PM  Result Value Ref Range   Glucose-Capillary 155 (H) 70 - 99 mg/dL  Basic metabolic panel     Status: Abnormal   Collection Time: 10/09/17 10:19 PM  Result Value Ref Range   Sodium 140 135 - 145 mmol/L   Potassium 3.7 3.5 - 5.1 mmol/L   Chloride 105 98 - 111 mmol/L   CO2 29 22 - 32 mmol/L   Glucose, Bld 133 (H) 70 - 99 mg/dL   BUN 23 (H) 6 - 20 mg/dL   Creatinine, Ser 0.94 0.61 - 1.24 mg/dL   Calcium 9.3 8.9 - 10.3 mg/dL   GFR calc non Af Amer >60 >60 mL/min   GFR calc Af Amer >60 >60 mL/min    Comment: (NOTE) The eGFR has been calculated using the CKD EPI equation. This calculation has not been validated in all clinical situations. eGFR's persistently <60 mL/min signify possible Chronic Kidney Disease.    Anion gap 6 5 - 15    Comment: Performed at [facility], [address] [place], [place] [address]  CBC     Status: Abnormal   Collection Time: 10/09/17 10:19 PM  Result Value Ref Range   WBC 5.9 3.8 - 10.6 K/uL   RBC 3.72 (L) 4.40 - 5.90 MIL/uL   Hemoglobin 11.9 (L) 13.0 - 18.0 g/dL   HCT 34.8 (L) 40.0 - 52.0 %   MCV 93.5 80.0 - 100.0 fL   MCH 31.9 26.0 - 34.0 pg   MCHC 34.1 32.0 - 36.0 g/dL   RDW 15.3 (H) 11.5 - 14.5 %   Platelets 280 150 - 440 K/uL    Comment: Performed at [facility], [address] [place], [place] [address]  Troponin I      Status: None   Collection Time: 10/09/17 10:19 PM  Result Value Ref Range   Troponin I <0.03 <0.03 ng/mL    Comment: Performed at [facility], [address] [place], [place] [address]  Hepatic function panel     Status: None   Collection Time: 10/09/17 10:19 PM  Result Value Ref Range   Total Protein 7.5 6.5 - 8.1 g/dL   Albumin 4.0 3.5 - 5.0 g/dL   AST 21 15 - 41 U/L   ALT 34 0 - 44 U/L   Alkaline Phosphatase 102 38 - 126 U/L   Total Bilirubin 0.5 0.3 - 1.2 mg/dL   Bilirubin, Direct <0.1 0.0 - 0.2 mg/dL   Indirect Bilirubin NOT CALCULATED 0.3 - 0.9 mg/dL    Comment: Performed at [facility], [address] [place], [place] [address]  Brain natriuretic peptide     Status: None   Collection Time: 10/09/17 10:19 PM  Result Value Ref Range   B Natriuretic Peptide 43.0 0.0 - 100.0 pg/mL    Comment: Performed at [facility], [address] [place], [place] [address]  CK     Status: None   Collection Time: 10/09/17 10:19 PM  Result Value Ref Range   Total CK 96 49 - 397 U/L    Comment: Performed at [facility], [address] [place], [place] [address]  Acetaminophen level     Status: Abnormal   Collection Time: 10/09/17 10:19 PM  Result Value Ref Range   Acetaminophen (Tylenol), Serum <10 (L) 10 - 30 ug/mL    Comment: (NOTE) Therapeutic concentrations vary  significantly. A range of 10-30 ug/mL  may be an effective concentration for many patients. However, some  are best treated at concentrations outside of this range. Acetaminophen concentrations >150 ug/mL at 4 hours after ingestion  and >50 ug/mL at 12 hours after ingestion are often associated with  toxic reactions. Performed at [facility], [address] [place], [place] [address]   Salicylate level     Status: None   Collection Time: 10/09/17 10:19 PM  Result Value Ref Range   Salicylate Lvl <3.5 2.8 - 30.0 mg/dL    Comment: Performed at [facility],  [address] [place], [place] [address]  Urine Drug Screen, Qualitative     Status: Abnormal   Collection Time: 10/09/17 10:30 PM  Result Value Ref Range   Tricyclic, Ur Screen NONE DETECTED NONE DETECTED   Amphetamines, Ur Screen NONE DETECTED NONE DETECTED   MDMA (Ecstasy)Ur Screen NONE DETECTED NONE DETECTED   Cocaine Metabolite,Ur [place] NONE DETECTED NONE DETECTED   Opiate, Ur Screen POSITIVE (A) NONE DETECTED   Phencyclidine (PCP) Ur S NONE DETECTED NONE DETECTED   Cannabinoid 50 Ng, Ur [place] NONE DETECTED NONE DETECTED   Barbiturates, Ur Screen NONE DETECTED NONE DETECTED   Benzodiazepine, Ur Scrn TEST NOT PERFORMED, REAGENT NOT AVAILABLE (A) NONE DETECTED   Methadone Scn, Ur NONE DETECTED NONE DETECTED    Comment: (NOTE) Tricyclics + metabolites, urine    Cutoff 1000 ng/mL Amphetamines + metabolites, urine  Cutoff 1000 ng/mL MDMA (Ecstasy), urine              Cutoff 500 ng/mL Cocaine Metabolite, urine          Cutoff 300 ng/mL Opiate + metabolites, urine        Cutoff 300 ng/mL Phencyclidine (PCP), urine         Cutoff 25 ng/mL Cannabinoid, urine                 Cutoff 50 ng/mL Barbiturates + metabolites, urine  Cutoff 200 ng/mL Benzodiazepine, urine              Cutoff 200 ng/mL Methadone, urine                   Cutoff 300 ng/mL The urine drug screen provides only a preliminary, unconfirmed analytical test result and should not be used for non-medical purposes. Clinical consideration and professional judgment should be applied to any positive drug screen result due to possible interfering substances. A more specific alternate chemical method must be used in order to obtain a confirmed analytical result. Gas chromatography / mass spectrometry (GC/MS) is the preferred confirmat ory method. Performed at [facility], [address] [place], [place] [address]   Glucose, capillary     Status: Abnormal   Collection Time: 10/10/17  2:13 AM  Result Value Ref Range    Glucose-Capillary 122 (H) 70 - 99 mg/dL  Basic metabolic panel     Status: Abnormal   Collection Time: 10/10/17  3:57 AM  Result Value Ref Range   Sodium 141 135 - 145 mmol/L   Potassium 3.2 (L) 3.5 - 5.1 mmol/L   Chloride 109 98 - 111 mmol/L   CO2 27 22 - 32 mmol/L   Glucose, Bld 128 (H) 70 - 99 mg/dL   BUN 21 (H) 6 - 20 mg/dL   Creatinine, Ser 0.78 0.61 - 1.24 mg/dL   Calcium 8.7 (L) 8.9 - 10.3 mg/dL   GFR calc non Af  Amer >60 >60 mL/min   GFR calc Af Amer >60 >60 mL/min    Comment: (NOTE) The eGFR has been calculated using the CKD EPI equation. This calculation has not been validated in all clinical situations. eGFR's persistently <60 mL/min signify possible Chronic Kidney Disease.    Anion gap 5 5 - 15    Comment: Performed at [facility], [address] [place], [place] [address]  CBC     Status: Abnormal   Collection Time: 10/10/17  3:57 AM  Result Value Ref Range   WBC 4.9 3.8 - 10.6 K/uL   RBC 3.19 (L) 4.40 - 5.90 MIL/uL   Hemoglobin 10.2 (L) 13.0 - 18.0 g/dL   HCT 29.9 (L) 40.0 - 52.0 %   MCV 93.7 80.0 - 100.0 fL   MCH 32.0 26.0 - 34.0 pg   MCHC 34.2 32.0 - 36.0 g/dL   RDW 14.9 (H) 11.5 - 14.5 %   Platelets 248 150 - 440 K/uL    Comment: Performed at [facility], [address] [place], [place] [address]  Glucose, capillary     Status: Abnormal   Collection Time: 10/10/17  8:06 AM  Result Value Ref Range   Glucose-Capillary 108 (H) 70 - 99 mg/dL  Glucose, capillary     Status: None   Collection Time: 10/10/17 12:17 PM  Result Value Ref Range   Glucose-Capillary 96 70 - 99 mg/dL  Glucose, capillary     Status: None   Collection Time: 10/10/17  4:51 PM  Result Value Ref Range   Glucose-Capillary 88 70 - 99 mg/dL    Current Facility-Administered Medications  Medication Dose Route Frequency Provider Last Rate Last Dose  . 0.9 %  sodium chloride infusion   Intravenous Continuous Amelia Jo, MD 100 mL/hr at 10/10/17 1409    .  acetaminophen (TYLENOL) tablet 650 mg  650 mg Oral Q6H PRN Amelia Jo, MD       Or  . acetaminophen (TYLENOL) suppository 650 mg  650 mg Rectal Q6H PRN Amelia Jo, MD      . amLODipine (NORVASC) tablet 10 mg  10 mg Oral Daily Amelia Jo, MD   10 mg at 10/10/17 1040  . aspirin EC tablet 81 mg  81 mg Oral Daily Amelia Jo, MD   81 mg at 10/10/17 1040  . atorvastatin (LIPITOR) tablet 40 mg  40 mg Oral Daily Amelia Jo, MD   40 mg at 10/10/17 1040  . bisacodyl (DULCOLAX) EC tablet 5 mg  5 mg Oral Daily PRN Amelia Jo, MD      . clopidogrel (PLAVIX) tablet 75 mg  75 mg Oral Daily Amelia Jo, MD   75 mg at 10/10/17 1040  . docusate sodium (COLACE) capsule 100 mg  100 mg Oral BID Amelia Jo, MD   100 mg at 10/10/17 1040  . heparin injection 5,000 Units  5,000 Units Subcutaneous Q8H Amelia Jo, MD   5,000 Units at 10/10/17 0502  . HYDROcodone-acetaminophen (NORCO/VICODIN) 5-325 MG per tablet 1-2 tablet  1-2 tablet Oral Q4H PRN Amelia Jo, MD      . insulin aspart (novoLOG) injection 0-15 Units  0-15 Units Subcutaneous TID WC Amelia Jo, MD      . insulin aspart (novoLOG) injection 0-5 Units  0-5 Units Subcutaneous QHS Amelia Jo, MD      . insulin aspart protamine- aspart (NOVOLOG MIX 70/30) injection 8 Units  8 Units Subcutaneous Q supper Amelia Jo, MD      .  insulin aspart protamine- aspart (NOVOLOG MIX 70/30) injection 9 Units  9 Units Subcutaneous Q breakfast Amelia Jo, MD   9 Units at 10/10/17 1039  . ondansetron (ZOFRAN) tablet 4 mg  4 mg Oral Q6H PRN Amelia Jo, MD       Or  . ondansetron [facility]) injection 4 mg  4 mg Intravenous Q6H PRN Amelia Jo, MD      . potassium chloride SA (K-DUR,KLOR-CON) CR tablet 40 mEq  40 mEq Oral Once Saundra Shelling, MD      . sertraline (ZOLOFT) tablet 50 mg  50 mg Oral Daily Amelia Jo, MD   50 mg at 10/10/17 1040    Musculoskeletal: Strength & Muscle Tone: decreased Gait & Station: unsteady Patient leans:  N/A  Psychiatric Specialty Exam: Physical Exam  Nursing note and vitals reviewed. Constitutional: He appears well-developed and well-nourished.  HENT:  Head: Normocephalic and atraumatic.  Eyes: Pupils are equal, round, and reactive to light. Conjunctivae are normal.  Neck: Normal range of motion.  Cardiovascular: Regular rhythm and normal heart sounds.  Respiratory: Effort normal. No respiratory distress.  GI: Soft.  Musculoskeletal: Normal range of motion.  Neurological: He is alert. He displays atrophy.  Patient has significant weakness on his left side with decreased use of his left upper extremity in particular  Skin: Skin is warm and dry.  Psychiatric: He has a normal mood and affect. His speech is delayed. He is slowed. Thought content is not paranoid. He expresses impulsivity. He expresses no homicidal and no suicidal ideation. He exhibits abnormal recent memory.    Review of Systems  Constitutional: Negative.   HENT: Negative.   Eyes: Negative.   Respiratory: Negative.   Cardiovascular: Negative.   Gastrointestinal: Negative.   Musculoskeletal: Negative.   Skin: Negative.   Neurological: Positive for focal weakness.  Psychiatric/Behavioral: Negative.     Blood pressure (!) 176/96, pulse 61, temperature 97.9 F (36.6 C), temperature source Oral, resp. rate 16, weight 107.5 kg, SpO2 97 %.Body mass index is 37.1 kg/m.  General Appearance: Casual  Eye Contact:  Fair  Speech:  Slow  Volume:  Decreased  Mood:  Euthymic  Affect:  Constricted  Thought Process:  Goal Directed  Orientation:  Full (Time, Place, and Person)  Thought Content:  Tangential  Suicidal Thoughts:  No  Homicidal Thoughts:  No  Memory:  Immediate;   Fair Recent;   Fair Remote;   Fair  Judgement:  Impaired  Insight:  Shallow  Psychomotor Activity:  Decreased  Concentration:  Concentration: Fair  Recall:  [employer] of Knowledge:  Poor  Language:  Fair  Akathisia:  No  Handed:  Right  AIMS  (if indicated):     Assets:  Desire for Improvement Housing  ADL's:  Impaired  Cognition:  Impaired,  Mild  Sleep:        Treatment Plan Summary: Plan 53 year old man with a history of substance abuse.  Came into the hospital with altered mental status.  Most likely opiate use would account for it.  I do not think that he can be relied on to give an accurate history of how much substances he had been taking.  Psychoeducation completed about the potential danger of overdose on pain medicine.  No indication however of acute suicidality no need for psychiatric hospitalization.  Encourage patient to seriously follow-up with [facility] and take better care of his medical problems.  Patient tells me that he is signing himself out of the hospital.  No  psychiatric indication to prevent this.  Disposition: No evidence of imminent risk to self or others at present.   Patient does not meet criteria for psychiatric inpatient admission. Supportive therapy provided about ongoing stressors.  Alethia Berthold, MD 10/10/2017 5:28 PM

## 2017-10-10 NOTE — Care Management (Addendum)
RNCM attempted to meet with patient to discuss [facility] and medication management - patient was sleeping. RNCM will need to follow. Application left at beside. I see lots of no-shows and co-pays owed to [facility] as he has been referred in the past.

## 2017-10-10 NOTE — Progress Notes (Signed)
Pt feeling "much better." states he is going home and will sign himself out. I explained dr. pyreddy wanted him to stay for one more day, but pt was adament about leaving. He signed a.m.aa. For. Brother to come to drive him home.

## 2017-10-11 ENCOUNTER — Ambulatory Visit: Payer: Self-pay

## 2017-10-12 LAB — HIV ANTIBODY (ROUTINE TESTING W REFLEX): HIV SCREEN 4TH GENERATION: NONREACTIVE

## 2017-10-13 ENCOUNTER — Ambulatory Visit: Payer: Self-pay | Admitting: Family Medicine

## 2017-10-13 VITALS — BP 156/98 | HR 72 | Temp 98.0°F | Ht 66.0 in | Wt 238.8 lb

## 2017-10-13 DIAGNOSIS — F331 Major depressive disorder, recurrent, moderate: Secondary | ICD-10-CM

## 2017-10-13 DIAGNOSIS — I639 Cerebral infarction, unspecified: Secondary | ICD-10-CM

## 2017-10-13 DIAGNOSIS — E1159 Type 2 diabetes mellitus with other circulatory complications: Secondary | ICD-10-CM

## 2017-10-13 DIAGNOSIS — G5692 Unspecified mononeuropathy of left upper limb: Secondary | ICD-10-CM

## 2017-10-13 DIAGNOSIS — Z794 Long term (current) use of insulin: Secondary | ICD-10-CM

## 2017-10-13 DIAGNOSIS — I10 Essential (primary) hypertension: Secondary | ICD-10-CM

## 2017-10-13 MED ORDER — GABAPENTIN 300 MG PO CAPS: 300.0000 mg | ORAL_CAPSULE | Freq: Three times a day (TID) | ORAL | 0 refills | Status: DC

## 2017-10-13 MED ORDER — SERTRALINE HCL 50 MG PO TABS: 50.0000 mg | ORAL_TABLET | Freq: Every day | ORAL | 0 refills | Status: DC

## 2017-10-13 MED ORDER — INSULIN ASPART PROT & ASPART (70-30 MIX) 100 UNIT/ML ~~LOC~~ SUSP: SUBCUTANEOUS | 1 refills | Status: DC

## 2017-10-13 MED ORDER — HYDROCHLOROTHIAZIDE 25 MG PO TABS: 25.0000 mg | ORAL_TABLET | Freq: Every day | ORAL | 1 refills | Status: DC

## 2017-10-13 MED ORDER — CLOPIDOGREL BISULFATE 75 MG PO TABS: 75.0000 mg | ORAL_TABLET | Freq: Every day | ORAL | 0 refills | Status: DC

## 2017-10-13 MED ORDER — AMLODIPINE BESYLATE 10 MG PO TABS: 10.0000 mg | ORAL_TABLET | Freq: Every day | ORAL | 0 refills | Status: DC

## 2017-10-13 MED ORDER — RISPERIDONE 1 MG PO TABS: 1.0000 mg | ORAL_TABLET | Freq: Every day | ORAL | 0 refills | Status: DC

## 2017-10-13 MED ORDER — ATORVASTATIN CALCIUM 40 MG PO TABS: 40.0000 mg | ORAL_TABLET | Freq: Every day | ORAL | 0 refills | Status: DC

## 2017-10-13 MED ORDER — TRAZODONE HCL 150 MG PO TABS: 150.0000 mg | ORAL_TABLET | Freq: Every day | ORAL | 0 refills | Status: DC

## 2017-10-13 NOTE — Patient Instructions (Signed)
Resume prior medicines  Start gabapentin 300mg  daily at bedtime and then after 1 week, increase to three times daily if able to tolerate This is for neuropathy

## 2017-10-13 NOTE — Progress Notes (Signed)
Primary Care Progress Note  Patient: Henry Munoz Male    DOB: Feb 23, 1965   53 y.o.   MRN: [id number] Visit Date: 10/15/2017  Today's Provider: Shirlee Latch MD  Chief Complaint  Patient presents with  . Medication Refill    Been out for 8 mo  . Peripheral Neuropathy    L arm - interested in meds   Subjective:    HPI   Patient's main concern is getting refills on his medications.  He states that he has been out of all of his medications for more than 8 months.  He last had lab work when he was seen in the emergency department on 10/09/2017 for altered mental status.  He was positive for opiates on urine drug screen.  He had a negative CT head and chest x-ray.  UA was negative for bacteria.  Stable CBC and CMP.  This was thought to be related to medication side effects as he had recently taken Percocet.  Patient has history of stroke in 2017.  He is taking Plavix and Lipitor.  He is also been out of these for more than 8 months.  He is most concerned about his left arm pain and numbness.  He was told in the past that this was neuropathy.  He has never taken medication for this.  Patient has history of substance abuse, including cocaine abuse.  He states he has been clean for about 2 years.  He was also previously taking amlodipine 10 mg daily, HCTZ 25 mg daily for hypertension.  He was taking NovoLog 70/30 insulin 12 units with breakfast and 11 units with dinner.  He was given insulin during his hospital stay, but otherwise has been out for several months.  He was also previously taking Risperdal, Zoloft, and trazodone for depression and possible schizoaffective disorder.  He denies any SI/HI, hallucinations, headaches, chest pain, visual changes, shortness of breath, abdominal pain.  It seems that at some point he was followed by [facility], but he does not think he currently has a psychiatrist.  He is planning to call [facility] to get a follow-up appointment, however.  Allergies  Allergen  Reactions  . Haldol [Haloperidol Lactate]     Current Outpatient Medications:  .  amLODipine (NORVASC) 10 MG tablet, Take 1 tablet (10 mg total) by mouth daily., Disp: 30 tablet, Rfl: 0 .  aspirin EC 81 MG EC tablet, Take 1 tablet (81 mg total) by mouth daily., Disp: 30 tablet, Rfl: 0 .  atorvastatin (LIPITOR) 40 MG tablet, Take 1 tablet (40 mg total) by mouth daily., Disp: 30 tablet, Rfl: 0 .  clopidogrel (PLAVIX) 75 MG tablet, Take 1 tablet (75 mg total) by mouth daily., Disp: 30 tablet, Rfl: 0 .  hydrochlorothiazide (HYDRODIURIL) 25 MG tablet, Take 1 tablet (25 mg total) by mouth daily., Disp: 30 tablet, Rfl: 1 .  insulin aspart protamine- aspart (NOVOLOG MIX 70/30) (70-30) 100 UNIT/ML injection, Inject 12 units daily with breakfast and 11units daily with dinner, Disp: 10 mL, Rfl: 1 .  risperiDONE (RISPERDAL) 1 MG tablet, Take 1 tablet (1 mg total) by mouth at bedtime., Disp: 30 tablet, Rfl: 0 .  sertraline (ZOLOFT) 50 MG tablet, Take 1 tablet (50 mg total) by mouth daily., Disp: 30 tablet, Rfl: 0 .  gabapentin (NEURONTIN) 300 MG capsule, Take 1 capsule (300 mg total) by mouth 3 (three) times daily., Disp: 90 capsule, Rfl: 0 .  traZODone (DESYREL) 150 MG tablet, Take 1 tablet (150 mg  total) by mouth at bedtime., Disp: 30 tablet, Rfl: 0  Review of Systems  Constitutional: Negative.   HENT: Negative.   Respiratory: Negative.   Cardiovascular: Negative.   Gastrointestinal: Negative.   Endocrine: Negative.   Genitourinary: Negative.   Neurological: Positive for weakness and numbness. Negative for dizziness, tremors, seizures, syncope, facial asymmetry, speech difficulty, light-headedness and headaches.  Psychiatric/Behavioral: Negative.     Social History   Tobacco Use  . Smoking status: Current Every Day Smoker    Packs/day: 0.50    Types: Cigarettes  . Smokeless tobacco: Never Used  Substance Use Topics  . Alcohol use: Yes    Alcohol/week: 2.0 standard drinks    Types: 2 Cans  of beer per week    Comment: per week   Objective:   BP (!) 156/98   Pulse 72   Temp 98 F (36.7 C)   Ht 5\' 6"  (1.676 m)   Wt 238 lb 12.8 oz (108.3 kg)   SpO2 100%   BMI 38.54 kg/m   Physical Exam  Constitutional: He is oriented to person, place, and time. He appears well-developed and well-nourished. No distress.  HENT:  Head: Normocephalic and atraumatic.  Eyes: Pupils are equal, round, and reactive to light. Conjunctivae and EOM are normal. No scleral icterus.  Neck: Neck supple. No thyromegaly present.  Cardiovascular: Normal rate, regular rhythm, normal heart sounds and intact distal pulses.  No murmur heard. Pulmonary/Chest: Effort normal and breath sounds normal. No respiratory distress. He has no wheezes. He has no rales.  Abdominal: Soft. Bowel sounds are normal. He exhibits no distension. There is no tenderness.  Musculoskeletal: He exhibits no edema or deformity.  Lymphadenopathy:    He has no cervical adenopathy.  Neurological: He is alert and oriented to person, place, and time.  Weakness of left arm compared to right arm in all muscle groups.  Decreased sensation of left arm as well.  Skin: Skin is warm and dry. Capillary refill takes less than 2 seconds. No rash noted.  Psychiatric: He has a normal mood and affect. His behavior is normal.  Vitals reviewed.       Assessment & Plan:   Problem List Items Addressed This Visit      Cardiovascular and Mediastinum   Essential hypertension    Uncontrolled Currently off all medications Resume amlodipine and HCTZ at previous doses Discussed warning signs of hypotension Check CMP      Relevant Medications   amLODipine (NORVASC) 10 MG tablet   atorvastatin (LIPITOR) 40 MG tablet   hydrochlorothiazide (HYDRODIURIL) 25 MG tablet   Other Relevant Orders   Comprehensive metabolic panel   Cerebrovascular accident (CVA) (HCC)    History of With residual left arm weakness and numbness Discussed importance of  blood pressure, diabetes, and cholesterol control as well as regularly taking his antiplatelet therapy Resume Plavix and aspirin, as well as Lipitor and blood pressure management      Relevant Medications   amLODipine (NORVASC) 10 MG tablet   atorvastatin (LIPITOR) 40 MG tablet   hydrochlorothiazide (HYDRODIURIL) 25 MG tablet   Other Relevant Orders   Lipid panel     Endocrine   Diabetes (HCC)    Previously uncontrolled Off of all medications Recheck A1c Resume NovoLog 70/30 12 units in the morning and 11 units with dinner Follow-up on blood sugars at next visit      Relevant Medications   insulin aspart protamine- aspart (NOVOLOG MIX 70/30) (70-30) 100 UNIT/ML injection   atorvastatin (  LIPITOR) 40 MG tablet   Other Relevant Orders   Hemoglobin A1c     Nervous and Auditory   Neuropathy, arm, left - Primary    New problem Likely residual stroke effects Start gabapentin 300 mg nightly and titrate up to 300 mg 3 times daily Follow-up on compliance and improvement at next visit Could consider further dose titration if needed      Relevant Medications   risperiDONE (RISPERDAL) 1 MG tablet   sertraline (ZOLOFT) 50 MG tablet   traZODone (DESYREL) 150 MG tablet   gabapentin (NEURONTIN) 300 MG capsule     Other   MDD (major depressive disorder)    Previously treated by psychiatry Discussed the importance of follow-up with [facility] regularly Refill of Zoloft, trazodone, and risperidone for 1 month with plan for patient to follow-up with psychiatry before this runs out      Relevant Medications   sertraline (ZOLOFT) 50 MG tablet   traZODone (DESYREL) 150 MG tablet      Return in about 4 weeks (around 11/10/2017) for diabetes, BP, and psych f/u.   Shirlee Latch, MD MPH

## 2017-10-14 NOTE — Discharge Summary (Signed)
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 Diabetes mellitus without complication (HCC)   . Hypertension   . Schizo-affective schizophrenia (HCC)   . TIA (transient ischemic attack)      ADMITTING HISTORY  Henry Munoz  is a 52 y.o. male with a known history of diabetes type 2, schizophrenia, bipolar disorder, hypertension, TIA. Patient is currently very drowsy, unable to provide any history.  This information was taken from reviewing the medical chart and from discussion with emergency room physician.Apparently, he was brought to emergency room for altered mental status/severe drowsiness.  It is unclear who called the paramedics but, per EMS notes, he was found at home very obtunded.  The room had a strong smell of recent marijuana use.  Patient is home psych meds and he also took 2 tablets of Percocet a few hours ago.  He was given 2 mg of naloxone without any improvement. I do not know if patient was started on any new medication, especially psych medications.Blood test done emergency room revealed a glucose level at 133, creatinine level is 0.94, WBC at 5.9, hemoglobin at 11.9.  UA is negative for bacteria.  Chest x-ray is negative for any acute  cardiopulmonary process.Head CT is negative for any acute intracranial abnormalities.  Patient is admitted for further evaluation and treatment  HOSPITAL COURSE:   Patient admitted to telemetry. He received IV fluids during stay in the hospital.His sedative meds were held. Patient mental status improved. He signed against medical advise and left hospital.  CONSULTS OBTAINED:  Treatment Team:  Clapacs, Jackquline Denmark, MD  DRUG ALLERGIES:   Allergies  Allergen Reactions  . Haldol [Haloperidol Lactate]     DISCHARGE MEDICATIONS:   Allergies as of 10/10/2017      Reactions   Haldol [haloperidol Lactate]       Medication List    ASK your doctor about these medications   aspirin 81 MG EC tablet Take 1 tablet (81 mg total) by mouth daily.       Today  Patient seen today Awake and responding to all verbal commands Patient left AMA  VITAL SIGNS:  Blood pressure (!) 176/96, pulse 61, temperature 97.9 F (36.6 C), temperature source Oral, resp. rate 16, weight 107.5 kg, SpO2 97 %.  I/O:  No intake or output data in the 24 hours ending 10/14/17 0941  PHYSICAL EXAMINATION:  Physical Exam  GENERAL:  52 y.o.-year-old patient lying in the bed with no acute distress.  LUNGS: Normal breath sounds bilaterally, no wheezing, rales,rhonchi or crepitation. No use of accessory muscles of respiration.  CARDIOVASCULAR: S1, S2 normal. No murmurs, rubs, or gallops.  ABDOMEN: Soft, non-tender, non-distended. Bowel sounds present. No organomegaly or mass.  NEUROLOGIC: Moves all 4 extremities. PSYCHIATRIC: The patient is alert and oriented x 3.  SKIN: No obvious rash, lesion, or ulcer.   DATA REVIEW:   CBC Recent Labs  Lab 10/10/17 0357  WBC 4.9  HGB 10.2*  HCT 29.9*  PLT 248    Chemistries  Recent Labs  Lab 10/09/17 2219 10/10/17 0357  NA 140 141  K 3.7 3.2*  CL 105 109  CO2 29 27  GLUCOSE 133* 128*  BUN 23* 21*  CREATININE 0.94 0.78  CALCIUM 9.3 8.7*  AST 21  --   ALT 34   --   ALKPHOS 102  --   BILITOT 0.5  --     Cardiac Enzymes Recent Labs  Lab 10/09/17 2219  TROPONINI <0.03    Microbiology Results  Results for orders placed or performed during the hospital encounter of 06/05/16  Urine culture     Status: Abnormal   Collection Time: 06/05/16  2:46 PM  Result Value Ref Range Status   Specimen Description URINE, RANDOM  Final   Special Requests NONE  Final   Culture MULTIPLE SPECIES PRESENT, SUGGEST RECOLLECTION (A)  Final   Report Status 06/07/2016 FINAL  Final    RADIOLOGY:  No results found.  Follow up with PCP in 1 week.  Management plans discussed with the patient, family and they are in agreement.  CODE STATUS: Full code Code Status History    Date Active Date Inactive Code Status Order ID Comments User Context   10/10/2017 0148 10/10/2017 2054 Full Code [id number]  Cammy Copa, MD Inpatient   08/09/2016 1818 08/13/2016 1753 Full Code [id number]  Audery Amel, MD Inpatient   01/30/2016 1404 02/01/2016 1357 Full Code [id number]  Adrian Saran, MD ED      TOTAL TIME TAKING CARE OF THIS PATIENT ON DAY OF DISCHARGE: more than 30 minutes.   Ihor Austin M.D on 10/14/2017 at 9:41 AM  Between 7am to 6pm - Pager - [phone]  After 6pm go to [email or web address] - password [email or web address]  [facility] [place] [facility]  Office  [phone]  CC: Primary care physician; System, Pcp Not In  Note: This dictation was prepared with Dragon dictation along with smaller phrase technology. Any transcriptional errors that result from this process are unintentional.

## 2017-10-15 DIAGNOSIS — G5692 Unspecified mononeuropathy of left upper limb: Secondary | ICD-10-CM | POA: Insufficient documentation

## 2017-10-15 NOTE — Assessment & Plan Note (Signed)
New problem Likely residual stroke effects Start gabapentin 300 mg nightly and titrate up to 300 mg 3 times daily Follow-up on compliance and improvement at next visit Could consider further dose titration if needed

## 2017-10-15 NOTE — Assessment & Plan Note (Signed)
Previously uncontrolled Off of all medications Recheck A1c Resume NovoLog 70/30 12 units in the morning and 11 units with dinner Follow-up on blood sugars at next visit

## 2017-10-15 NOTE — Assessment & Plan Note (Signed)
Previously treated by psychiatry Discussed the importance of follow-up with [facility] regularly Refill of Zoloft, trazodone, and risperidone for 1 month with plan for patient to follow-up with psychiatry before this runs out

## 2017-10-15 NOTE — Assessment & Plan Note (Addendum)
History of With residual left arm weakness and numbness Discussed importance of blood pressure, diabetes, and cholesterol control as well as regularly taking his antiplatelet therapy Resume Plavix and aspirin, as well as Lipitor and blood pressure management

## 2017-10-15 NOTE — Assessment & Plan Note (Signed)
Uncontrolled Currently off all medications Resume amlodipine and HCTZ at previous doses Discussed warning signs of hypotension Check CMP

## 2017-10-18 ENCOUNTER — Telehealth: Payer: Self-pay | Admitting: Adult Health Nurse Practitioner

## 2017-10-18 ENCOUNTER — Other Ambulatory Visit: Payer: Self-pay

## 2017-10-18 NOTE — Telephone Encounter (Signed)
Patient called to reschedule labs because he does not have ride here

## 2017-10-20 ENCOUNTER — Other Ambulatory Visit: Payer: Medicaid Other

## 2017-10-20 DIAGNOSIS — I10 Essential (primary) hypertension: Secondary | ICD-10-CM

## 2017-10-21 LAB — COMPREHENSIVE METABOLIC PANEL
ALBUMIN: 3.8 g/dL (ref 3.5–5.5)
ALK PHOS: 102 IU/L (ref 39–117)
ALT: 19 IU/L (ref 0–44)
AST: 14 IU/L (ref 0–40)
Albumin/Globulin Ratio: 1.4 (ref 1.2–2.2)
BUN / CREAT RATIO: 15 (ref 9–20)
BUN: 13 mg/dL (ref 6–24)
Bilirubin Total: <0.2 mg/dL (ref 0.0–1.2)
CALCIUM: 9.3 mg/dL (ref 8.7–10.2)
CO2: 25 mmol/L (ref 20–29)
CREATININE: 0.88 mg/dL (ref 0.76–1.27)
Chloride: 104 mmol/L (ref 96–106)
GFR calc Af Amer: 114 mL/min/{1.73_m2} (ref >59)
GFR calc non Af Amer: 99 mL/min/{1.73_m2} (ref >59)
Globulin, Total: 2.8 g/dL (ref 1.5–4.5)
Glucose: 130 mg/dL — ABNORMAL HIGH (ref 65–99)
Potassium: 4 mmol/L (ref 3.5–5.2)
SODIUM: 145 mmol/L — AB (ref 134–144)
Total Protein: 6.6 g/dL (ref 6.0–8.5)

## 2017-10-21 LAB — HEMOGLOBIN A1C
Est. average glucose Bld gHb Est-mCnc: 123 mg/dL
HEMOGLOBIN A1C: 5.9 % — AB (ref 4.8–5.6)

## 2017-11-08 ENCOUNTER — Ambulatory Visit: Payer: Self-pay

## 2017-12-01 ENCOUNTER — Telehealth: Payer: Self-pay | Admitting: Pharmacy Technician

## 2017-12-01 NOTE — Telephone Encounter (Signed)
Patient failed to provide 2019 poi.  No additional medication assistance will be provided by [facility] without the required proof of income documentation.  Patient notified by letter.  Arin Peral J. Shabre Kreher Care Manager [facility] 

## 2018-09-07 IMAGING — CT CT HEAD W/O CM
3 of 6 series · 15 of 47 positions shown, 18 images · non-contrast
Comparison: Brain MRI dated 01/30/2016 and head CT dated 01/30/2016

CLINICAL DATA: 52-year-old male with altered mental status.

EXAM:
CT HEAD WITHOUT CONTRAST
TECHNIQUE: Contiguous axial images were obtained from the base of the skull
through the vertex without intravenous contrast.

[Series 3: head wo · axial · 0.42mm/px · z∈[+437,+562]mm · 10 of 31 slices shown, 13 images]
[im 3/31  brain]
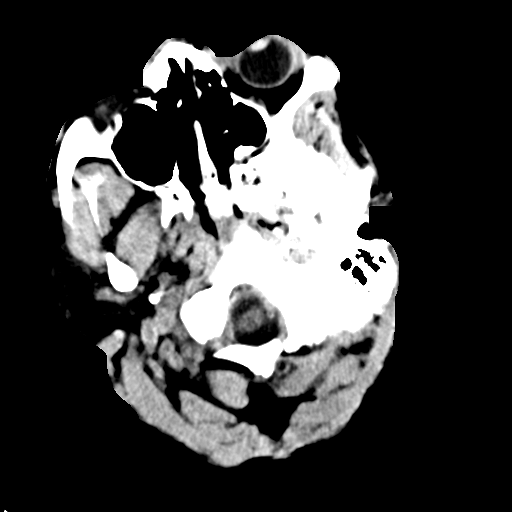
[im 3/31  bone]
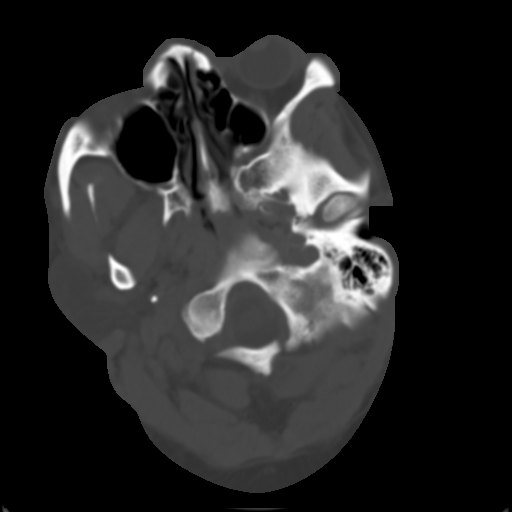
[im 5/31  brain]
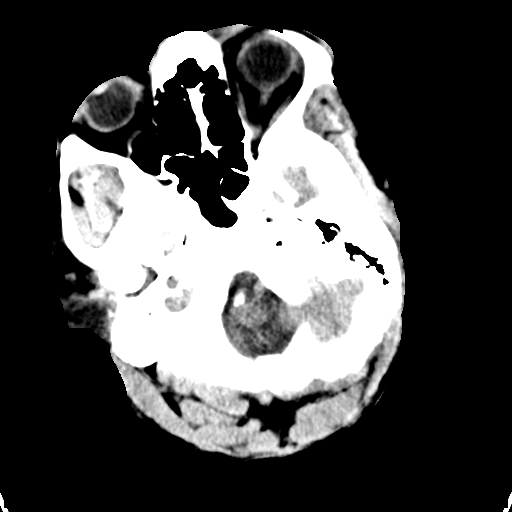
[im 9/31  brain]
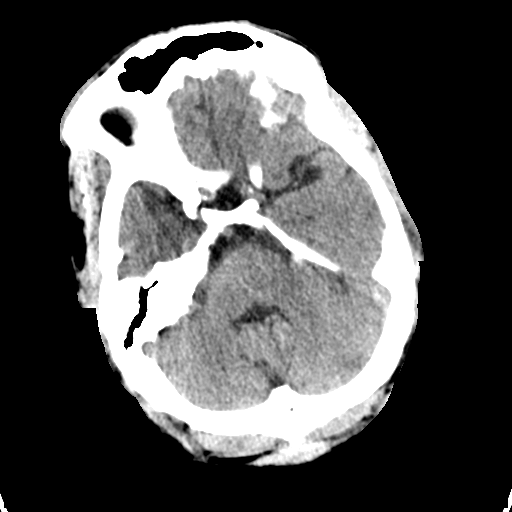
[im 11/31  brain]
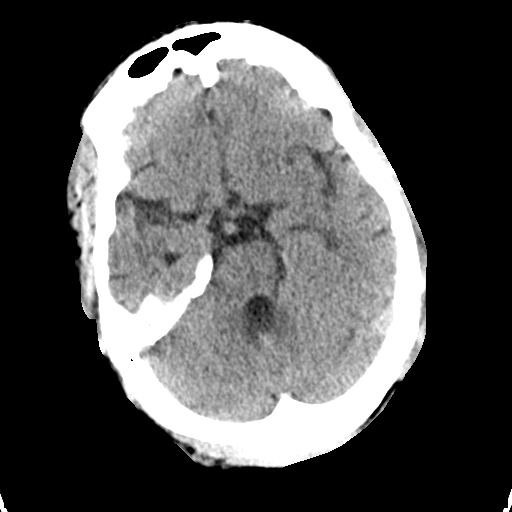
[im 13/31  brain]
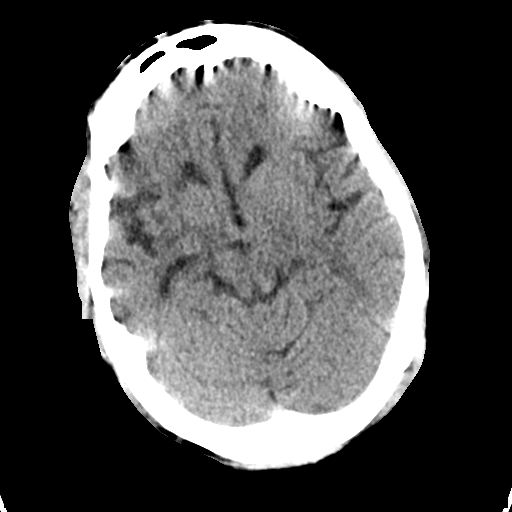
[im 13/31  bone]
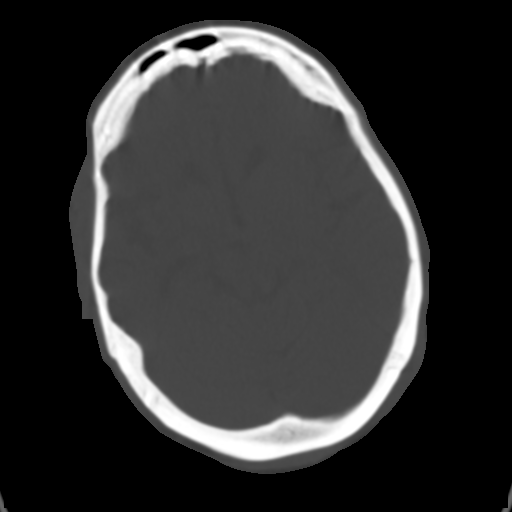
[im 18/31  brain]
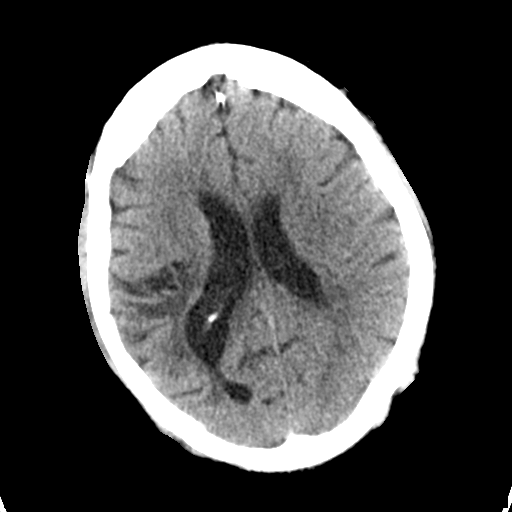
[im 20/31  brain]
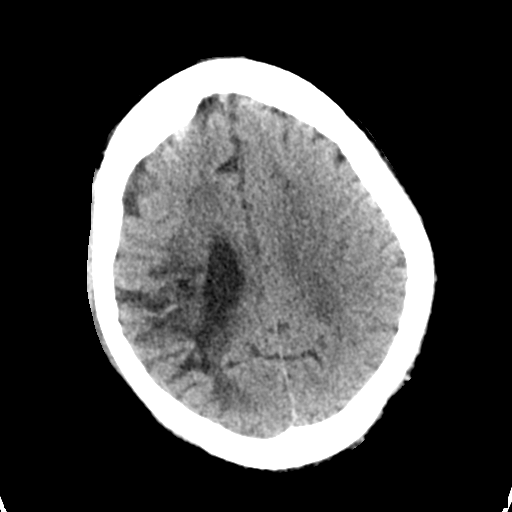
[im 22/31  brain]
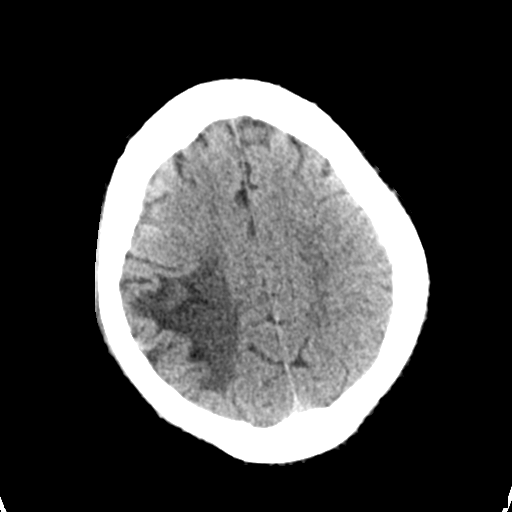
[im 26/31  brain]
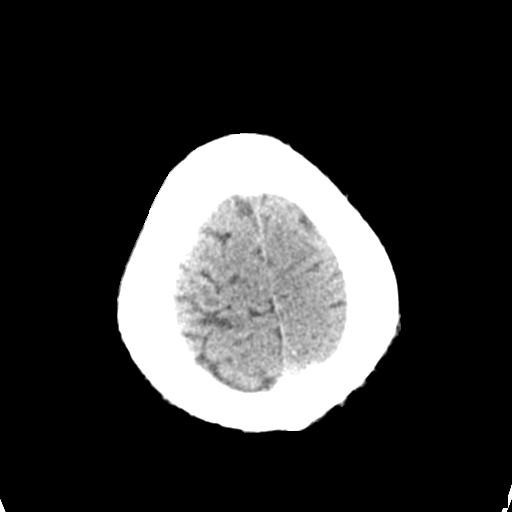
[im 26/31  bone]
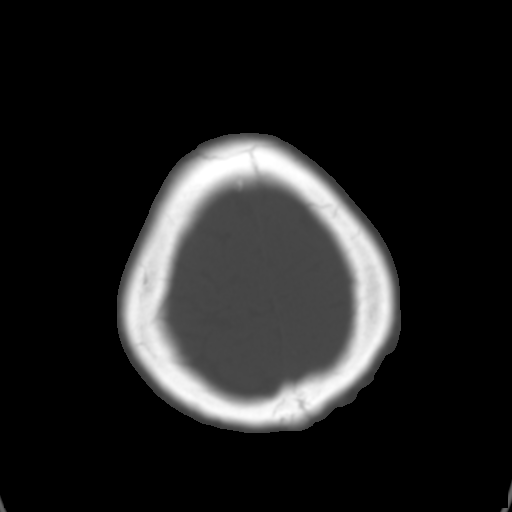
[im 28/31  brain]
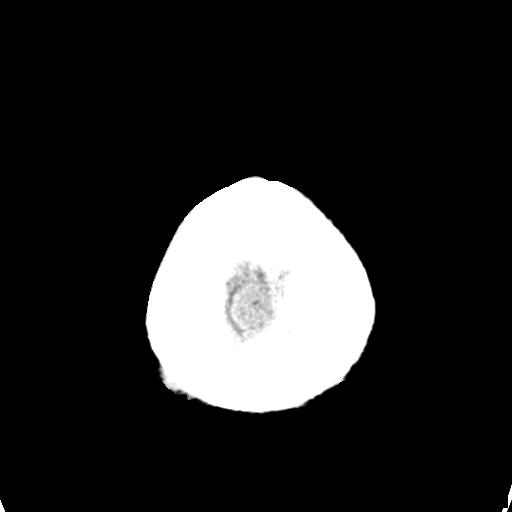

[Series 6: coronal soft tissue · coronal · 0.31mm/px · 3 of 66 slices shown]
[im 17/66  brain]
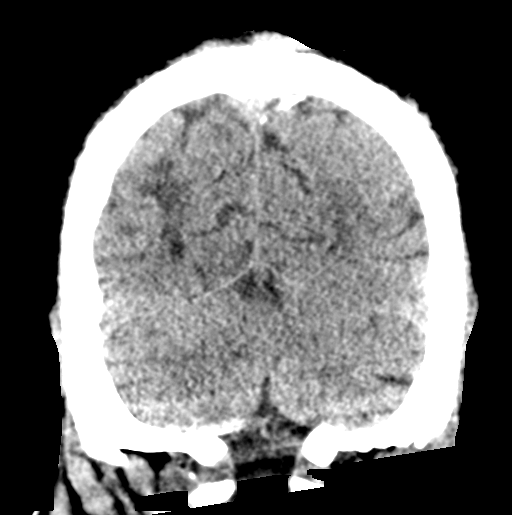
[im 33/66  brain]
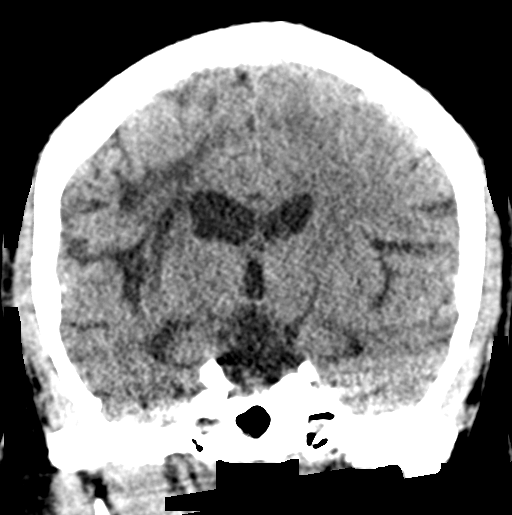
[im 49/66  brain]
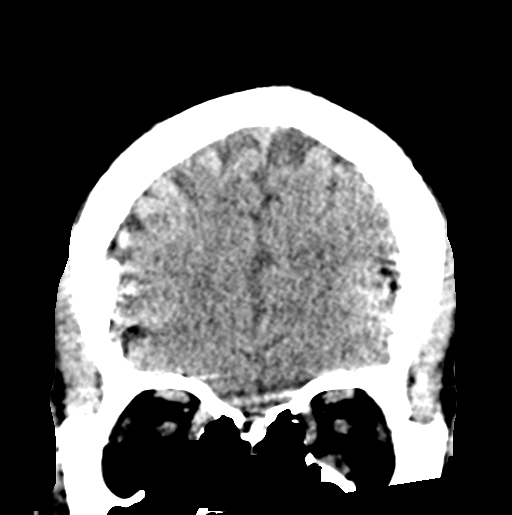

[Series 9: sagittal soft tissue · sagittal · 0.30mm/px · 2 of 54 slices shown]
[im 18/54  brain]
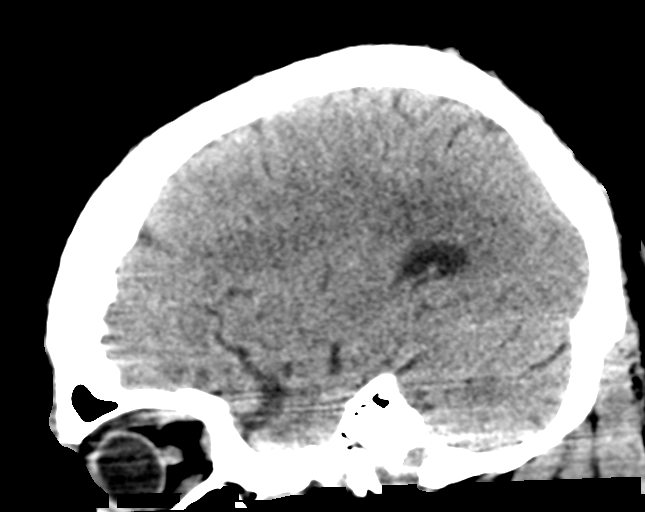
[im 36/54  brain]
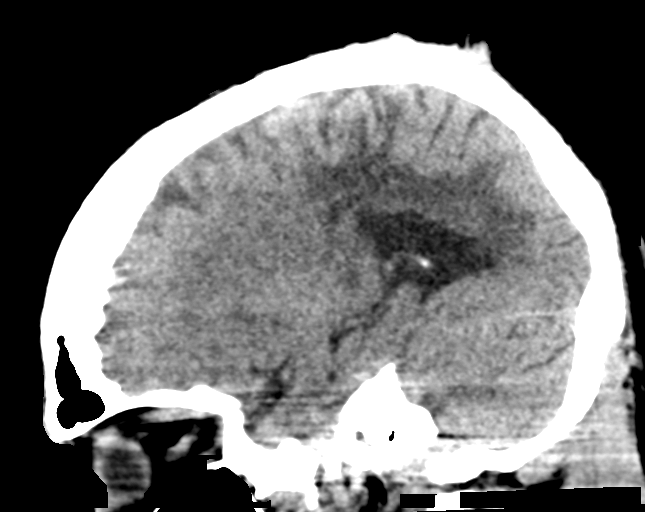

[15 of 47 positions shown; findings below may reference images not displayed]

FINDINGS: Evaluation of this exam is limited due to motion artifact.

Brain: Large area of old infarct involving the right MCA territory
as seen on the prior CT. There is associated mild ex vacuo
dilatation of the right lateral ventricle. Areas of hypodensity
involving bilateral basal ganglia are age indeterminate but likely
chronic. Acute or subacute infarct is less likely but not entirely
excluded. Clinical correlation is recommended. If there is high
clinical concern for acute ischemia further evaluation with MRI is
recommended. There is diffuse chronic microvascular ischemic
changes. There is no acute intracranial hemorrhage. No mass effect
or midline shift. No extra-axial fluid collection.

Vascular: No hyperdense vessel or unexpected calcification.

Skull: Normal. Negative for fracture or focal lesion.

Sinuses/Orbits: No acute finding.

Other: None
IMPRESSION: 1. No acute intracranial hemorrhage.
2. Age-related atrophy and chronic microvascular ischemic changes
and large area of right MCA territory old infarct.
3. Bilateral basal ganglia low attenuation, likely chronic. If there
is clinical concern for acute ischemia, further evaluation with MRI
is recommended.

## 2018-11-08 ENCOUNTER — Telehealth: Payer: Self-pay

## 2018-11-08 NOTE — Telephone Encounter (Signed)
Called pt on 9/30 at 3:21pm and left message with his mom to call us back for fu appt and eligibility update

## 2021-10-26 ENCOUNTER — Emergency Department: Payer: Medicaid Other

## 2021-10-26 ENCOUNTER — Emergency Department
Admission: EM | Admit: 2021-10-26 | Discharge: 2021-10-26 | Disposition: A | Discharge status: Home / Self Care | Payer: Medicaid Other | Attending: Emergency Medicine | Admitting: Emergency Medicine

## 2021-10-26 ENCOUNTER — Other Ambulatory Visit: Payer: Self-pay

## 2021-10-26 DIAGNOSIS — M545 Low back pain, unspecified: Secondary | ICD-10-CM | POA: Diagnosis present

## 2021-10-26 DIAGNOSIS — M5442 Lumbago with sciatica, left side: Secondary | ICD-10-CM | POA: Diagnosis not present

## 2021-10-26 DIAGNOSIS — E119 Type 2 diabetes mellitus without complications: Secondary | ICD-10-CM | POA: Diagnosis not present

## 2021-10-26 MED ORDER — METHOCARBAMOL 500 MG PO TABS
500.0000 mg | ORAL_TABLET | Freq: Four times a day (QID) | ORAL | 0 refills | Status: DC
  Filled 2021-10-26: qty 30, 8d supply, fill #0

## 2021-10-26 MED ORDER — KETOROLAC TROMETHAMINE 30 MG/ML IJ SOLN
30.0000 mg | Freq: Once | INTRAMUSCULAR | Status: AC
  Administered 2021-10-26: 30 mg via INTRAMUSCULAR
  Filled 2021-10-26: qty 1

## 2021-10-26 MED ORDER — METHOCARBAMOL 500 MG PO TABS
1000.0000 mg | ORAL_TABLET | Freq: Once | ORAL | Status: AC
  Administered 2021-10-26: 1000 mg via ORAL
  Filled 2021-10-26: qty 2

## 2021-10-26 MED ORDER — MELOXICAM 15 MG PO TABS
15.0000 mg | ORAL_TABLET | Freq: Every day | ORAL | 0 refills | Status: DC
  Filled 2021-10-26: qty 30, 30d supply, fill #0

## 2021-10-26 MED ORDER — OXYCODONE-ACETAMINOPHEN 5-325 MG PO TABS
1.0000 | ORAL_TABLET | Freq: Once | ORAL | Status: AC
  Administered 2021-10-26: 1 via ORAL
  Filled 2021-10-26: qty 1

## 2021-10-26 NOTE — ED Triage Notes (Signed)
Pt comes with c/o left foot pain. Pt states hx of neuropathy. Pt states pain is shooting down back and leg now. Pt denies any known injuries.

## 2021-10-26 NOTE — ED Provider Notes (Signed)
[facility] Provider Note  Patient Contact: 3:22 PM (approximate)   History   Foot Injury   HPI  Henry Munoz is a 56 y.o. male who presents the emergency department complaining of lower back pain rating down the left leg.  Patient has a history of low back pain after an injury in the 31s.  Patient takes gabapentin for peripheral the secondary to both diabetes and his back issues.  Patient states that he has developed acute lower back pain.  There is radicular symptoms on the left leg with pain.  There is no numbness no loss of sensation, bowel or bladder dysfunction or saddle anesthesia.  Patient takes gabapentin regularly for his neuropathy.  Neuropathy affects both sides, with left side being worse.  He does have a history of a previous CVA back in 2017 which left generalized left-sided weakness when compared with right.  Patient is noted no change in weakness, only pain on the left leg.     Physical Exam   Triage Vital Signs: ED Triage Vitals  Enc Vitals Group     BP 10/26/21 1427 (!) 176/92     Pulse Rate 10/26/21 1427 75     Resp 10/26/21 1427 18     Temp 10/26/21 1427 98 F (36.7 C)     Temp src --      SpO2 10/26/21 1427 100 %     Weight 10/26/21 1520 238 lb 12.1 oz (108.3 kg)     Height 10/26/21 1520 5\' 6"  (1.676 m)     Head Circumference --      Peak Flow --      Pain Score 10/26/21 1425 10     Pain Loc --      Pain Edu? --      Excl. in [place]? --     Most recent vital signs: Vitals:   10/26/21 1427  BP: (!) 176/92  Pulse: 75  Resp: 18  Temp: 98 F (36.7 C)  SpO2: 100%     General: Alert and in no acute distress. Neck: No stridor. No cervical spine tenderness to palpation.  Cardiovascular:  Good peripheral perfusion Respiratory: Normal respiratory effort without tachypnea or retractions. Lungs CTAB. Good air entry to the bases with no decreased or absent breath sounds. Musculoskeletal: Full range of motion to all extremities.   Visualization of the lumbar spine reveals no visible signs of trauma.  Patient has midline and left paraspinal muscle tenderness to palpation.  There is diffuse tenderness without point specific findings or palpable abnormality.  No right-sided paraspinal muscle tenderness.  Extension into the SI joint and sciatic notch is appreciated with tenderness.  Patient with ongoing good range of motion to left lower extremity.  There is weakness of the left side when compared with right, however degree of weakness appears consistent with left arm weakness when compared with right. Neurologic:  No gross focal neurologic deficits are appreciated.  Skin:   No rash noted Other:   ED Results / Procedures / Treatments   Labs (all labs ordered are listed, but only abnormal results are displayed) Labs Reviewed - No data to display   EKG     RADIOLOGY  I personally viewed, evaluated, and interpreted these images as part of my medical decision making, as well as reviewing the written report by the radiologist.  ED Provider Interpretation: CT scan reveals multiple levels of degenerative changes without other acute finding.  CT Lumbar Spine Wo Contrast  Result  Date: 10/26/2021 CLINICAL DATA:  Low back pain, cauda equina syndrome suspected; left foot pain EXAM: CT LUMBAR SPINE WITHOUT CONTRAST TECHNIQUE: Multidetector CT imaging of the lumbar spine was performed without intravenous contrast administration. Multiplanar CT image reconstructions were also generated. RADIATION DOSE REDUCTION: This exam was performed according to the departmental dose-optimization program which includes automated exposure control, adjustment of the mA and/or kV according to patient size and/or use of iterative reconstruction technique. COMPARISON:  No prior CT of the lumbar spine, correlation is made with lumbar spine radiographs 06/21/2016 FINDINGS: Segmentation: 5 lumbar-type vertebral bodies. Alignment: S shaped curvature of the  thoracolumbar spine. No listhesis. Vertebrae: No acute fracture or suspicious osseous lesion. Endplate degenerative changes and Schmorl's node formation at the L4-L5 endplates. Paraspinal and other soft tissues: Vascular calcifications. Otherwise negative. Disc levels: T12-L1: No significant disc bulge. No spinal canal stenosis or neural foraminal narrowing. L1-L2: No significant disc bulge. No spinal canal stenosis or neural foraminal narrowing. L2-L3: Mild disc bulge. Mild facet arthropathy. Epidural lipomatosis. Mild thecal sac narrowing. Mild left neural foraminal narrowing. L3-L4: Mild disc bulge. Mild facet arthropathy. Epidural lipomatosis. Ligamentum flavum hypertrophy. Mild thecal sac narrowing. Mild-to-moderate bilateral neural foraminal narrowing. L4-L5: Moderate disc bulge. Mild facet arthropathy. Ligamentum flavum hypertrophy. Epidural lipomatosis. Mild thecal sac narrowing. Mild-to-moderate left and mild right neural foraminal narrowing. L5-S1: Mild disc bulge. Mild facet arthropathy. No spinal canal stenosis or neural foraminal narrowing. IMPRESSION: 1. Degenerative changes and prominent epidural fat cause mild thecal sac narrowing at L2-L3, L3-L4, and L4-L5. 2. Multilevel facet arthropathy contributes to mild and mild-to-moderate neural foraminal narrowing at L2-L3, L3-L4, and L4-L5, scribed above. 3. No acute fracture or suspicious osseous lesion. Electronically Signed   By: Wiliam Ke M.D.   On: 10/26/2021 16:39    PROCEDURES:  Critical Care performed: No  Procedures   MEDICATIONS ORDERED IN ED: Medications  oxyCODONE-acetaminophen (PERCOCET/ROXICET) 5-325 MG per tablet 1 tablet (has no administration in time range)  ketorolac (TORADOL) 30 MG/ML injection 30 mg (has no administration in time range)  methocarbamol (ROBAXIN) tablet 1,000 mg (has no administration in time range)     IMPRESSION / MDM / ASSESSMENT AND PLAN / ED COURSE  I reviewed the triage vital signs and the  nursing notes.                              Differential diagnosis includes, but is not limited to, lumbago, sciatica, neuropathy, cauda equina  Patient's presentation is most consistent with acute presentation with potential threat to life or bodily function.   Patient's diagnosis is consistent with lumbago with sciatica.  Patient presented to the emergency department with increasing left leg pain.  Pain radiates from the patient's back down the left leg.  There is no concerning neuro deficits of weakness, bowel or bladder incontinence or saddle anesthesia.  Patient is noted to have left-sided generalized weakness from a previous CVA but the amount of weakness on the left lower extremity is equivalent to the amount of left upper extremity weakness when compared with right side.  Patient has not noticed any weakness but has had increased pain.  CT scan revealed multilevel degenerative changes.  At this time do not feel indication for further work-up given the patient's reassuring neuro status and no acute findings on CT scan.  I will treat the patient symptoms with anti-inflammatory, muscle relaxer.  Follow-up with neurosurgery.  Concerning signs and symptoms neurologically are discussed  with the patient.. Patient is given ED precautions to return to the ED for any worsening or new symptoms.        FINAL CLINICAL IMPRESSION(S) / ED DIAGNOSES   Final diagnoses:  Acute left-sided low back pain with left-sided sciatica     Rx / DC Orders   ED Discharge Orders          Ordered    meloxicam (MOBIC) 15 MG tablet  Daily        10/26/21 1727    methocarbamol (ROBAXIN) 500 MG tablet  4 times daily        10/26/21 1727             Note:  This document was prepared using Dragon voice recognition software and may include unintentional dictation errors.   Brynda Peon 10/26/21 1727    Carrie Mew, MD 10/27/21 [phone]

## 2021-10-28 ENCOUNTER — Other Ambulatory Visit: Payer: Self-pay

## 2021-11-06 ENCOUNTER — Other Ambulatory Visit: Payer: Self-pay

## 2021-11-27 ENCOUNTER — Encounter: Payer: Self-pay | Admitting: Family Medicine

## 2021-12-02 ENCOUNTER — Emergency Department
Admission: EM | Admit: 2021-12-02 | Discharge: 2021-12-02 | Disposition: A | Discharge status: Home / Self Care | Payer: Medicaid Other | Attending: Emergency Medicine | Admitting: Emergency Medicine

## 2021-12-02 ENCOUNTER — Other Ambulatory Visit: Payer: Self-pay

## 2021-12-02 ENCOUNTER — Encounter: Payer: Self-pay | Admitting: Emergency Medicine

## 2021-12-02 DIAGNOSIS — E1165 Type 2 diabetes mellitus with hyperglycemia: Secondary | ICD-10-CM | POA: Diagnosis not present

## 2021-12-02 DIAGNOSIS — R21 Rash and other nonspecific skin eruption: Secondary | ICD-10-CM | POA: Diagnosis present

## 2021-12-02 DIAGNOSIS — Z794 Long term (current) use of insulin: Secondary | ICD-10-CM | POA: Diagnosis not present

## 2021-12-02 DIAGNOSIS — R739 Hyperglycemia, unspecified: Secondary | ICD-10-CM

## 2021-12-02 DIAGNOSIS — I10 Essential (primary) hypertension: Secondary | ICD-10-CM | POA: Diagnosis not present

## 2021-12-02 DIAGNOSIS — N481 Balanitis: Secondary | ICD-10-CM | POA: Diagnosis not present

## 2021-12-02 LAB — CBC
HCT: 37.7 % — ABNORMAL LOW (ref 39.0–52.0)
Hemoglobin: 12 g/dL — ABNORMAL LOW (ref 13.0–17.0)
MCH: 29.6 pg (ref 26.0–34.0)
MCHC: 31.8 g/dL (ref 30.0–36.0)
MCV: 93.1 fL (ref 80.0–100.0)
Platelets: 254 10*3/uL (ref 150–400)
RBC: 4.05 MIL/uL — ABNORMAL LOW (ref 4.22–5.81)
RDW: 14.8 % (ref 11.5–15.5)
WBC: 6 10*3/uL (ref 4.0–10.5)
nRBC: 0 % (ref 0.0–0.2)

## 2021-12-02 LAB — COMPREHENSIVE METABOLIC PANEL
ALT: 18 U/L (ref 0–44)
AST: 14 U/L — ABNORMAL LOW (ref 15–41)
Albumin: 3.8 g/dL (ref 3.5–5.0)
Alkaline Phosphatase: 134 U/L — ABNORMAL HIGH (ref 38–126)
Anion gap: 9 (ref 5–15)
BUN: 16 mg/dL (ref 6–20)
CO2: 24 mmol/L (ref 22–32)
Calcium: 9.4 mg/dL (ref 8.9–10.3)
Chloride: 99 mmol/L (ref 98–111)
Creatinine, Ser: 1.13 mg/dL (ref 0.61–1.24)
GFR, Estimated: >60 mL/min (ref >60)
Glucose, Bld: 464 mg/dL — ABNORMAL HIGH (ref 70–99)
Potassium: 3.9 mmol/L (ref 3.5–5.1)
Sodium: 132 mmol/L — ABNORMAL LOW (ref 135–145)
Total Bilirubin: 0.4 mg/dL (ref 0.3–1.2)
Total Protein: 7.2 g/dL (ref 6.5–8.1)

## 2021-12-02 LAB — URINALYSIS, ROUTINE W REFLEX MICROSCOPIC
Bilirubin Urine: NEGATIVE
Glucose, UA: >=500 mg/dL — AB
Ketones, ur: NEGATIVE mg/dL
Nitrite: NEGATIVE
Protein, ur: NEGATIVE mg/dL
Specific Gravity, Urine: 1.035 — ABNORMAL HIGH (ref 1.005–1.030)
pH: 5 (ref 5.0–8.0)

## 2021-12-02 LAB — CBG MONITORING, ED
Glucose-Capillary: 438 mg/dL — ABNORMAL HIGH (ref 70–99)
Glucose-Capillary: 384 mg/dL — ABNORMAL HIGH (ref 70–99)
Glucose-Capillary: 270 mg/dL — ABNORMAL HIGH (ref 70–99)

## 2021-12-02 MED ORDER — SODIUM CHLORIDE 0.9 % IV BOLUS
1000.0000 mL | Freq: Once | INTRAVENOUS | Status: AC
  Administered 2021-12-02: 1000 mL via INTRAVENOUS

## 2021-12-02 MED ORDER — CLOTRIMAZOLE 1 % EX CREA
1.0000 | TOPICAL_CREAM | Freq: Two times a day (BID) | CUTANEOUS | 0 refills | Status: DC
  Filled 2021-12-02: qty 28, 14d supply, fill #0

## 2021-12-02 MED ORDER — FLUCONAZOLE 50 MG PO TABS
150.0000 mg | ORAL_TABLET | Freq: Once | ORAL | Status: AC
  Administered 2021-12-02: 150 mg via ORAL
  Filled 2021-12-02: qty 1

## 2021-12-02 MED ORDER — CLOTRIMAZOLE 1 % EX CREA: 1.0000 | TOPICAL_CREAM | Freq: Two times a day (BID) | CUTANEOUS | 0 refills | Status: DC

## 2021-12-02 MED ORDER — INSULIN ASPART 100 UNIT/ML IJ SOLN
10.0000 [IU] | Freq: Once | INTRAMUSCULAR | Status: AC
  Administered 2021-12-02: 10 [IU] via INTRAVENOUS
  Filled 2021-12-02: qty 1

## 2021-12-02 MED ORDER — "INSULIN SYRINGE-NEEDLE U-100 31G X 1/4"" 0.5 ML MISC"
0 refills | Status: DC
  Filled 2021-12-02: qty 60, 30d supply, fill #0

## 2021-12-02 MED ORDER — FENTANYL CITRATE PF 50 MCG/ML IJ SOSY
50.0000 ug | PREFILLED_SYRINGE | Freq: Once | INTRAMUSCULAR | Status: AC
  Administered 2021-12-02: 50 ug via INTRAVENOUS
  Filled 2021-12-02: qty 1

## 2021-12-02 MED ORDER — INSULIN ASPART PROT & ASPART (70-30 MIX) 100 UNIT/ML ~~LOC~~ SUSP
SUBCUTANEOUS | 1 refills | Status: DC
  Filled 2021-12-02: qty 10, fill #0
  Filled 2021-12-02: qty 10, 43d supply, fill #0

## 2021-12-02 NOTE — ED Provider Notes (Signed)
[facility] Provider Note    Event Date/Time   First MD Initiated Contact with Patient 12/02/21 [phone]     (approximate)  History   Chief Complaint: Hyperglycemia and Rash  HPI  Henry Munoz is a 56 y.o. male with a past medical history of diabetes, schizophrenia, hypertension, presents emergency department for a rash to the head of his penis.  According to the patient for the last 3 days he has been out of his regular insulin but still using his Lantus insulin.  Patient states he believes his blood sugar has been running high.  He states for the past 2 days he has noted a rash to the head of his penis.  Patient is uncircumcised.  Patient states he has had a yeast infection to this area before when his blood sugar got very high and this feels similar.  Physical Exam   Triage Vital Signs: ED Triage Vitals  Enc Vitals Group     BP 12/02/21 0128 (!) 150/92     Pulse Rate 12/02/21 0128 (!) 109     Resp 12/02/21 0128 20     Temp 12/02/21 0128 98.8 F (37.1 C)     Temp Source 12/02/21 0128 Oral     SpO2 12/02/21 0128 98 %     Weight 12/02/21 0129 (!) 305 lb (138.3 kg)     Height 12/02/21 0129 5\' 7"  (1.702 m)     Head Circumference --      Peak Flow --      Pain Score --      Pain Loc --      Pain Edu? --      Excl. in [place]? --     Most recent vital signs: Vitals:   12/02/21 0128  BP: (!) 150/92  Pulse: (!) 109  Resp: 20  Temp: 98.8 F (37.1 C)  SpO2: 98%    General: Awake, no distress.  CV:  Good peripheral perfusion.  Regular rate and rhythm  Resp:  Normal effort.  Equal breath sounds bilaterally.  Abd:  No distention.  Soft, nontender.  No rebound or guarding. Other:  Patient is uncircumcised, has erythema noted to the portion of the glans that is visible with white discharge between the glans of the penis and foreskin with thickening of the distal foreskin.  Exam most consistent with balanitis.   ED Results / Procedures / Treatments    MEDICATIONS ORDERED IN ED: Medications  sodium chloride 0.9 % bolus 1,000 mL (has no administration in time range)     IMPRESSION / MDM / ASSESSMENT AND PLAN / ED COURSE  I reviewed the triage vital signs and the nursing notes.  Patient's presentation is most consistent with acute presentation with potential threat to life or bodily function.  Patient presents emergency department for what appears to be consistent with balanitis.  EMS states blood sugar 495.  We will check labs and obtain a urine sample.  We will begin IV hydration.  Patient will likely require insulin administration.  As far as the balanitis we will prescribe antifungal medication for the patient.  Patient agreeable to plan of care.  Chemistry shows blood sugar 464 otherwise reassuring.  Reassuring CBC urinalysis shows no significant findings besides glucose.  Patient's work-up is reassuring, blood sugars down below 300.  We will discharge with clotrimazole for balanitis.  Patient agreeable to plan of care.  I will also refill the patient's short acting insulin.  FINAL CLINICAL IMPRESSION(S) /  ED DIAGNOSES   Balanitis Hyperglycemia    Note:  This document was prepared using Dragon voice recognition software and may include unintentional dictation errors.   Harvest Dark, MD 12/02/21 [phone]

## 2021-12-02 NOTE — ED Triage Notes (Signed)
Pt to ED for what he describes as a "diabetic Yeast infection" to the head of his penis x1 day. Pt also concerned that in route his blood sugar was elevated to 495 and he has been without his regular insulin x3 days but still compliant with his Lantis injections.

## 2021-12-21 ENCOUNTER — Other Ambulatory Visit: Payer: Self-pay

## 2021-12-22 ENCOUNTER — Other Ambulatory Visit: Payer: Self-pay

## 2022-02-18 ENCOUNTER — Ambulatory Visit: Payer: Medicaid Other | Admitting: Physician Assistant

## 2022-06-29 ENCOUNTER — Emergency Department: Payer: Medicaid Other

## 2022-06-29 ENCOUNTER — Inpatient Hospital Stay: Payer: Medicaid Other

## 2022-06-29 ENCOUNTER — Inpatient Hospital Stay
Admission: EM | Admit: 2022-06-29 | Discharge: 2022-06-30 | DRG: 163 | Discharge status: Against Medical Advice | Payer: Medicaid Other | Attending: Internal Medicine | Admitting: Internal Medicine

## 2022-06-29 ENCOUNTER — Inpatient Hospital Stay (HOSPITAL_COMMUNITY)
Admit: 2022-06-29 | Discharge: 2022-06-29 | Disposition: A | Discharge status: Home / Self Care | Payer: Medicaid Other | Attending: Nurse Practitioner | Admitting: Nurse Practitioner

## 2022-06-29 DIAGNOSIS — I2609 Other pulmonary embolism with acute cor pulmonale: Secondary | ICD-10-CM | POA: Diagnosis present

## 2022-06-29 DIAGNOSIS — I959 Hypotension, unspecified: Secondary | ICD-10-CM | POA: Diagnosis present

## 2022-06-29 DIAGNOSIS — I6611 Occlusion and stenosis of right anterior cerebral artery: Secondary | ICD-10-CM | POA: Diagnosis present

## 2022-06-29 DIAGNOSIS — E1165 Type 2 diabetes mellitus with hyperglycemia: Secondary | ICD-10-CM

## 2022-06-29 DIAGNOSIS — I6389 Other cerebral infarction: Secondary | ICD-10-CM | POA: Diagnosis not present

## 2022-06-29 DIAGNOSIS — I10 Essential (primary) hypertension: Secondary | ICD-10-CM | POA: Diagnosis present

## 2022-06-29 DIAGNOSIS — R7881 Bacteremia: Secondary | ICD-10-CM | POA: Diagnosis not present

## 2022-06-29 DIAGNOSIS — Z794 Long term (current) use of insulin: Secondary | ICD-10-CM

## 2022-06-29 DIAGNOSIS — T8089XA Other complications following infusion, transfusion and therapeutic injection, initial encounter: Secondary | ICD-10-CM | POA: Diagnosis not present

## 2022-06-29 DIAGNOSIS — I2699 Other pulmonary embolism without acute cor pulmonale: Secondary | ICD-10-CM | POA: Diagnosis present

## 2022-06-29 DIAGNOSIS — Z7982 Long term (current) use of aspirin: Secondary | ICD-10-CM

## 2022-06-29 DIAGNOSIS — F1410 Cocaine abuse, uncomplicated: Secondary | ICD-10-CM | POA: Diagnosis present

## 2022-06-29 DIAGNOSIS — E1110 Type 2 diabetes mellitus with ketoacidosis without coma: Secondary | ICD-10-CM | POA: Diagnosis present

## 2022-06-29 DIAGNOSIS — I69354 Hemiplegia and hemiparesis following cerebral infarction affecting left non-dominant side: Secondary | ICD-10-CM

## 2022-06-29 DIAGNOSIS — R782 Finding of cocaine in blood: Secondary | ICD-10-CM

## 2022-06-29 DIAGNOSIS — Z888 Allergy status to other drugs, medicaments and biological substances status: Secondary | ICD-10-CM | POA: Diagnosis not present

## 2022-06-29 DIAGNOSIS — R7989 Other specified abnormal findings of blood chemistry: Secondary | ICD-10-CM | POA: Insufficient documentation

## 2022-06-29 DIAGNOSIS — F17210 Nicotine dependence, cigarettes, uncomplicated: Secondary | ICD-10-CM | POA: Diagnosis present

## 2022-06-29 DIAGNOSIS — Z7902 Long term (current) use of antithrombotics/antiplatelets: Secondary | ICD-10-CM

## 2022-06-29 DIAGNOSIS — F259 Schizoaffective disorder, unspecified: Secondary | ICD-10-CM | POA: Diagnosis present

## 2022-06-29 DIAGNOSIS — Y848 Other medical procedures as the cause of abnormal reaction of the patient, or of later complication, without mention of misadventure at the time of the procedure: Secondary | ICD-10-CM | POA: Diagnosis not present

## 2022-06-29 DIAGNOSIS — F319 Bipolar disorder, unspecified: Secondary | ICD-10-CM | POA: Diagnosis present

## 2022-06-29 DIAGNOSIS — E119 Type 2 diabetes mellitus without complications: Secondary | ICD-10-CM

## 2022-06-29 DIAGNOSIS — N179 Acute kidney failure, unspecified: Secondary | ICD-10-CM | POA: Insufficient documentation

## 2022-06-29 DIAGNOSIS — I50810 Right heart failure, unspecified: Secondary | ICD-10-CM

## 2022-06-29 DIAGNOSIS — T801XXA Vascular complications following infusion, transfusion and therapeutic injection, initial encounter: Secondary | ICD-10-CM | POA: Diagnosis not present

## 2022-06-29 DIAGNOSIS — Z5329 Procedure and treatment not carried out because of patient's decision for other reasons: Secondary | ICD-10-CM | POA: Diagnosis present

## 2022-06-29 DIAGNOSIS — B9561 Methicillin susceptible Staphylococcus aureus infection as the cause of diseases classified elsewhere: Secondary | ICD-10-CM | POA: Diagnosis present

## 2022-06-29 DIAGNOSIS — Z79899 Other long term (current) drug therapy: Secondary | ICD-10-CM | POA: Diagnosis not present

## 2022-06-29 DIAGNOSIS — Z791 Long term (current) use of non-steroidal anti-inflammatories (NSAID): Secondary | ICD-10-CM | POA: Diagnosis not present

## 2022-06-29 DIAGNOSIS — E875 Hyperkalemia: Secondary | ICD-10-CM | POA: Diagnosis not present

## 2022-06-29 DIAGNOSIS — R0902 Hypoxemia: Secondary | ICD-10-CM | POA: Diagnosis present

## 2022-06-29 LAB — URINALYSIS, ROUTINE W REFLEX MICROSCOPIC
Bilirubin Urine: NEGATIVE
Glucose, UA: >=500 mg/dL — AB
Hgb urine dipstick: NEGATIVE
Ketones, ur: 20 mg/dL — AB
Leukocytes,Ua: NEGATIVE
Nitrite: NEGATIVE
Protein, ur: 100 mg/dL — AB
Specific Gravity, Urine: 1.039 — ABNORMAL HIGH (ref 1.005–1.030)
pH: 5 (ref 5.0–8.0)

## 2022-06-29 LAB — BLOOD GAS, VENOUS
Acid-base deficit: 5.7 mmol/L — ABNORMAL HIGH (ref 0.0–2.0)
Bicarbonate: 20.7 mmol/L (ref 20.0–28.0)
O2 Saturation: 70.3 %
Patient temperature: 37
pCO2, Ven: 43 mmHg — ABNORMAL LOW (ref 44–60)
pH, Ven: 7.29 (ref 7.25–7.43)
pO2, Ven: 46 mmHg — ABNORMAL HIGH (ref 32–45)

## 2022-06-29 LAB — DIFFERENTIAL
Abs Immature Granulocytes: 0.03 10*3/uL (ref 0.00–0.07)
Basophils Absolute: 0 10*3/uL (ref 0.0–0.1)
Basophils Relative: 0 %
Eosinophils Absolute: 0 10*3/uL (ref 0.0–0.5)
Eosinophils Relative: 0 %
Immature Granulocytes: 0 %
Lymphocytes Relative: 20 %
Lymphs Abs: 1.5 10*3/uL (ref 0.7–4.0)
Monocytes Absolute: 0.5 10*3/uL (ref 0.1–1.0)
Monocytes Relative: 6 %
Neutro Abs: 5.6 10*3/uL (ref 1.7–7.7)
Neutrophils Relative %: 74 %

## 2022-06-29 LAB — CBC
HCT: 43.8 % (ref 39.0–52.0)
HCT: 41.7 % (ref 39.0–52.0)
Hemoglobin: 13.8 g/dL (ref 13.0–17.0)
Hemoglobin: 13.2 g/dL (ref 13.0–17.0)
MCH: 30.6 pg (ref 26.0–34.0)
MCH: 30.8 pg (ref 26.0–34.0)
MCHC: 31.5 g/dL (ref 30.0–36.0)
MCHC: 31.7 g/dL (ref 30.0–36.0)
MCV: 97.1 fL (ref 80.0–100.0)
MCV: 97.2 fL (ref 80.0–100.0)
Platelets: 197 10*3/uL (ref 150–400)
Platelets: 168 10*3/uL (ref 150–400)
RBC: 4.51 MIL/uL (ref 4.22–5.81)
RBC: 4.29 MIL/uL (ref 4.22–5.81)
RDW: 12.8 % (ref 11.5–15.5)
RDW: 13 % (ref 11.5–15.5)
WBC: 7.6 10*3/uL (ref 4.0–10.5)
WBC: 10 10*3/uL (ref 4.0–10.5)
nRBC: 0 % (ref 0.0–0.2)
nRBC: 0 % (ref 0.0–0.2)

## 2022-06-29 LAB — BLOOD CULTURE ID PANEL (REFLEXED) - BCID2

## 2022-06-29 LAB — BASIC METABOLIC PANEL
Anion gap: 13 (ref 5–15)
Anion gap: 11 (ref 5–15)
Anion gap: 9 (ref 5–15)
Anion gap: 8 (ref 5–15)
BUN: 18 mg/dL (ref 6–20)
BUN: 17 mg/dL (ref 6–20)
BUN: 17 mg/dL (ref 6–20)
BUN: 17 mg/dL (ref 6–20)
CO2: 18 mmol/L — ABNORMAL LOW (ref 22–32)
CO2: 21 mmol/L — ABNORMAL LOW (ref 22–32)
CO2: 23 mmol/L (ref 22–32)
CO2: 24 mmol/L (ref 22–32)
Calcium: 8.5 mg/dL — ABNORMAL LOW (ref 8.9–10.3)
Calcium: 8.9 mg/dL (ref 8.9–10.3)
Calcium: 8.8 mg/dL — ABNORMAL LOW (ref 8.9–10.3)
Calcium: 8.7 mg/dL — ABNORMAL LOW (ref 8.9–10.3)
Chloride: 99 mmol/L (ref 98–111)
Chloride: 103 mmol/L (ref 98–111)
Chloride: 102 mmol/L (ref 98–111)
Chloride: 103 mmol/L (ref 98–111)
Creatinine, Ser: 1.4 mg/dL — ABNORMAL HIGH (ref 0.61–1.24)
Creatinine, Ser: 1.28 mg/dL — ABNORMAL HIGH (ref 0.61–1.24)
Creatinine, Ser: 1.05 mg/dL (ref 0.61–1.24)
Creatinine, Ser: 1.12 mg/dL (ref 0.61–1.24)
GFR, Estimated: 59 mL/min — ABNORMAL LOW (ref >60)
GFR, Estimated: >60 mL/min (ref >60)
GFR, Estimated: >60 mL/min (ref >60)
GFR, Estimated: >60 mL/min (ref >60)
Glucose, Bld: 381 mg/dL — ABNORMAL HIGH (ref 70–99)
Glucose, Bld: 245 mg/dL — ABNORMAL HIGH (ref 70–99)
Glucose, Bld: 158 mg/dL — ABNORMAL HIGH (ref 70–99)
Glucose, Bld: 154 mg/dL — ABNORMAL HIGH (ref 70–99)
Potassium: 6.8 mmol/L (ref 3.5–5.1)
Potassium: 3.4 mmol/L — ABNORMAL LOW (ref 3.5–5.1)
Potassium: 3.9 mmol/L (ref 3.5–5.1)
Potassium: 3.8 mmol/L (ref 3.5–5.1)
Sodium: 130 mmol/L — ABNORMAL LOW (ref 135–145)
Sodium: 135 mmol/L (ref 135–145)
Sodium: 134 mmol/L — ABNORMAL LOW (ref 135–145)
Sodium: 135 mmol/L (ref 135–145)

## 2022-06-29 LAB — URINE DRUG SCREEN, QUALITATIVE (ARMC ONLY)
Amphetamines, Ur Screen: NOT DETECTED
Barbiturates, Ur Screen: NOT DETECTED
Benzodiazepine, Ur Scrn: NOT DETECTED
Cannabinoid 50 Ng, Ur ~~LOC~~: NOT DETECTED
Cocaine Metabolite,Ur ~~LOC~~: POSITIVE — AB
MDMA (Ecstasy)Ur Screen: NOT DETECTED
Methadone Scn, Ur: NOT DETECTED
Opiate, Ur Screen: NOT DETECTED
Phencyclidine (PCP) Ur S: NOT DETECTED
Tricyclic, Ur Screen: NOT DETECTED

## 2022-06-29 LAB — LIPASE, BLOOD: Lipase: 33 U/L (ref 11–51)

## 2022-06-29 LAB — CBG MONITORING, ED
Glucose-Capillary: 417 mg/dL — ABNORMAL HIGH (ref 70–99)
Glucose-Capillary: 441 mg/dL — ABNORMAL HIGH (ref 70–99)

## 2022-06-29 LAB — MAGNESIUM: Magnesium: 2 mg/dL (ref 1.7–2.4)

## 2022-06-29 LAB — CULTURE, BLOOD (ROUTINE X 2)

## 2022-06-29 LAB — ETHANOL: Alcohol, Ethyl (B): <10 mg/dL (ref <10)

## 2022-06-29 LAB — COMPREHENSIVE METABOLIC PANEL
ALT: 64 U/L — ABNORMAL HIGH (ref 0–44)
AST: 73 U/L — ABNORMAL HIGH (ref 15–41)
Albumin: 3.8 g/dL (ref 3.5–5.0)
Alkaline Phosphatase: 89 U/L (ref 38–126)
Anion gap: 17 — ABNORMAL HIGH (ref 5–15)
BUN: 18 mg/dL (ref 6–20)
CO2: 18 mmol/L — ABNORMAL LOW (ref 22–32)
Calcium: 8.9 mg/dL (ref 8.9–10.3)
Chloride: 97 mmol/L — ABNORMAL LOW (ref 98–111)
Creatinine, Ser: 1.55 mg/dL — ABNORMAL HIGH (ref 0.61–1.24)
GFR, Estimated: 52 mL/min — ABNORMAL LOW (ref >60)
Glucose, Bld: 444 mg/dL — ABNORMAL HIGH (ref 70–99)
Potassium: 3.8 mmol/L (ref 3.5–5.1)
Sodium: 132 mmol/L — ABNORMAL LOW (ref 135–145)
Total Bilirubin: 1.3 mg/dL — ABNORMAL HIGH (ref 0.3–1.2)
Total Protein: 6.8 g/dL (ref 6.5–8.1)

## 2022-06-29 LAB — ECHOCARDIOGRAM COMPLETE
AR max vel: 2.57 cm2
AV Area VTI: 2.87 cm2
AV Area mean vel: 2.66 cm2
AV Mean grad: 2 mmHg
AV Peak grad: 4 mmHg
Ao pk vel: 1 m/s
Area-P 1/2: 3.03 cm2
Height: 67 in
S' Lateral: 2.9 cm
Weight: 4035.3 oz

## 2022-06-29 LAB — GLUCOSE, CAPILLARY
Glucose-Capillary: 326 mg/dL — ABNORMAL HIGH (ref 70–99)
Glucose-Capillary: 220 mg/dL — ABNORMAL HIGH (ref 70–99)
Glucose-Capillary: 197 mg/dL — ABNORMAL HIGH (ref 70–99)
Glucose-Capillary: 172 mg/dL — ABNORMAL HIGH (ref 70–99)
Glucose-Capillary: 157 mg/dL — ABNORMAL HIGH (ref 70–99)
Glucose-Capillary: 160 mg/dL — ABNORMAL HIGH (ref 70–99)
Glucose-Capillary: 84 mg/dL (ref 70–99)

## 2022-06-29 LAB — LACTIC ACID, PLASMA
Lactic Acid, Venous: 2.9 mmol/L (ref 0.5–1.9)
Lactic Acid, Venous: 2.4 mmol/L (ref 0.5–1.9)

## 2022-06-29 LAB — HEMOGLOBIN A1C
Hgb A1c MFr Bld: 14 % — ABNORMAL HIGH (ref 4.8–5.6)
Mean Plasma Glucose: 355.1 mg/dL

## 2022-06-29 LAB — TROPONIN I (HIGH SENSITIVITY)
Troponin I (High Sensitivity): 362 ng/L (ref <18)
Troponin I (High Sensitivity): 466 ng/L (ref <18)

## 2022-06-29 LAB — HEPARIN LEVEL (UNFRACTIONATED)
Heparin Unfractionated: 0.81 IU/mL — ABNORMAL HIGH (ref 0.30–0.70)
Heparin Unfractionated: 0.74 IU/mL — ABNORMAL HIGH (ref 0.30–0.70)
Heparin Unfractionated: 0.39 IU/mL (ref 0.30–0.70)

## 2022-06-29 LAB — BETA-HYDROXYBUTYRIC ACID
Beta-Hydroxybutyric Acid: 3.09 mmol/L — ABNORMAL HIGH (ref 0.05–0.27)
Beta-Hydroxybutyric Acid: 3.65 mmol/L — ABNORMAL HIGH (ref 0.05–0.27)
Beta-Hydroxybutyric Acid: 0.29 mmol/L — ABNORMAL HIGH (ref 0.05–0.27)
Beta-Hydroxybutyric Acid: 0.37 mmol/L — ABNORMAL HIGH (ref 0.05–0.27)

## 2022-06-29 LAB — TSH: TSH: 0.881 u[IU]/mL (ref 0.350–4.500)

## 2022-06-29 LAB — BRAIN NATRIURETIC PEPTIDE: B Natriuretic Peptide: 205.8 pg/mL — ABNORMAL HIGH (ref 0.0–100.0)

## 2022-06-29 LAB — HIV ANTIBODY (ROUTINE TESTING W REFLEX): HIV Screen 4th Generation wRfx: NONREACTIVE

## 2022-06-29 LAB — LIPID PANEL
Cholesterol: 251 mg/dL — ABNORMAL HIGH (ref 0–200)
HDL: 26 mg/dL — ABNORMAL LOW (ref >40)
LDL Cholesterol: 206 mg/dL — ABNORMAL HIGH (ref 0–99)
Total CHOL/HDL Ratio: 9.7 RATIO
Triglycerides: 96 mg/dL (ref <150)
VLDL: 19 mg/dL (ref 0–40)

## 2022-06-29 LAB — PROTIME-INR
INR: 1.3 — ABNORMAL HIGH (ref 0.8–1.2)
Prothrombin Time: 16.2 seconds — ABNORMAL HIGH (ref 11.4–15.2)

## 2022-06-29 LAB — STREP PNEUMONIAE URINARY ANTIGEN: Strep Pneumo Urinary Antigen: NEGATIVE

## 2022-06-29 LAB — APTT: aPTT: 28 seconds (ref 24–36)

## 2022-06-29 LAB — PHOSPHORUS: Phosphorus: 4.2 mg/dL (ref 2.5–4.6)

## 2022-06-29 LAB — MRSA NEXT GEN BY PCR, NASAL: MRSA by PCR Next Gen: NOT DETECTED

## 2022-06-29 LAB — PROCALCITONIN: Procalcitonin: 0.16 ng/mL

## 2022-06-29 MED ORDER — IOHEXOL 350 MG/ML SOLN
100.0000 mL | Freq: Once | INTRAVENOUS | Status: AC | PRN
  Administered 2022-06-29: 100 mL via INTRAVENOUS

## 2022-06-29 MED ORDER — DEXTROSE IN LACTATED RINGERS 5 % IV SOLN: INTRAVENOUS | Status: DC

## 2022-06-29 MED ORDER — ASPIRIN 81 MG PO CHEW
324.0000 mg | CHEWABLE_TABLET | Freq: Once | ORAL | Status: AC
  Administered 2022-06-29: 324 mg via ORAL
  Filled 2022-06-29: qty 4

## 2022-06-29 MED ORDER — CHLORHEXIDINE GLUCONATE CLOTH 2 % EX PADS: 6.0000 | MEDICATED_PAD | Freq: Every day | CUTANEOUS | Status: DC

## 2022-06-29 MED ORDER — LACTATED RINGERS IV BOLUS
1000.0000 mL | Freq: Once | INTRAVENOUS | Status: AC
  Administered 2022-06-29: 1000 mL via INTRAVENOUS

## 2022-06-29 MED ORDER — HEPARIN (PORCINE) 25000 UT/250ML-% IV SOLN
1600.0000 [IU]/h | INTRAVENOUS | Status: DC
  Administered 2022-06-29: 1400 [IU]/h via INTRAVENOUS
  Administered 2022-06-29: 1500 [IU]/h via INTRAVENOUS
  Administered 2022-06-30: 1400 [IU]/h via INTRAVENOUS
  Filled 2022-06-29 (×3): qty 250

## 2022-06-29 MED ORDER — INSULIN REGULAR(HUMAN) IN NACL 100-0.9 UT/100ML-% IV SOLN
INTRAVENOUS | Status: DC
  Administered 2022-06-29: 9.5 [IU]/h via INTRAVENOUS
  Filled 2022-06-29 (×2): qty 100

## 2022-06-29 MED ORDER — VANCOMYCIN HCL 1500 MG/300ML IV SOLN
1500.0000 mg | Freq: Once | INTRAVENOUS | Status: AC
  Administered 2022-06-29: 1500 mg via INTRAVENOUS
  Filled 2022-06-29: qty 300

## 2022-06-29 MED ORDER — DEXTROSE 50 % IV SOLN: 0.0000 mL | INTRAVENOUS | Status: DC | PRN

## 2022-06-29 MED ORDER — POTASSIUM CHLORIDE 10 MEQ/100ML IV SOLN
10.0000 meq | Freq: Once | INTRAVENOUS | Status: AC
  Administered 2022-06-29: 10 meq via INTRAVENOUS
  Filled 2022-06-29: qty 100

## 2022-06-29 MED ORDER — INSULIN GLARGINE-YFGN 100 UNIT/ML ~~LOC~~ SOLN
16.0000 [IU] | Freq: Every day | SUBCUTANEOUS | Status: DC
  Administered 2022-06-29 – 2022-06-30 (×2): 16 [IU] via SUBCUTANEOUS
  Filled 2022-06-29 (×2): qty 0.16

## 2022-06-29 MED ORDER — VANCOMYCIN HCL 1250 MG/250ML IV SOLN: 1250.0000 mg | INTRAVENOUS | Status: DC

## 2022-06-29 MED ORDER — POTASSIUM CHLORIDE 10 MEQ/100ML IV SOLN
10.0000 meq | INTRAVENOUS | Status: AC
  Administered 2022-06-29 (×2): 10 meq via INTRAVENOUS
  Filled 2022-06-29: qty 100

## 2022-06-29 MED ORDER — HEPARIN SODIUM (PORCINE) 5000 UNIT/ML IJ SOLN: 4000.0000 [IU] | Freq: Once | INTRAMUSCULAR | Status: DC

## 2022-06-29 MED ORDER — LACTATED RINGERS IV SOLN: INTRAVENOUS | Status: DC

## 2022-06-29 MED ORDER — DOCUSATE SODIUM 100 MG PO CAPS: 100.0000 mg | ORAL_CAPSULE | Freq: Two times a day (BID) | ORAL | Status: DC | PRN

## 2022-06-29 MED ORDER — LACTATED RINGERS IV BOLUS: 20.0000 mL/kg | Freq: Once | INTRAVENOUS | Status: DC

## 2022-06-29 MED ORDER — VANCOMYCIN HCL IN DEXTROSE 1-5 GM/200ML-% IV SOLN
1000.0000 mg | Freq: Once | INTRAVENOUS | Status: AC
  Administered 2022-06-29: 1000 mg via INTRAVENOUS
  Filled 2022-06-29: qty 200

## 2022-06-29 MED ORDER — SODIUM CHLORIDE 0.9 % IV SOLN
2.0000 g | Freq: Three times a day (TID) | INTRAVENOUS | Status: DC
  Administered 2022-06-29 (×2): 2 g via INTRAVENOUS
  Filled 2022-06-29 (×4): qty 12.5

## 2022-06-29 MED ORDER — VANCOMYCIN HCL 1750 MG/350ML IV SOLN
1750.0000 mg | INTRAVENOUS | Status: DC
  Administered 2022-06-30: 1750 mg via INTRAVENOUS
  Filled 2022-06-29: qty 350

## 2022-06-29 MED ORDER — CALCIUM GLUCONATE-NACL 1-0.675 GM/50ML-% IV SOLN
1.0000 g | Freq: Once | INTRAVENOUS | Status: AC
  Administered 2022-06-29: 1000 mg via INTRAVENOUS
  Filled 2022-06-29 (×2): qty 50

## 2022-06-29 MED ORDER — HEPARIN BOLUS VIA INFUSION
5000.0000 [IU] | Freq: Once | INTRAVENOUS | Status: AC
  Administered 2022-06-29: 5000 [IU] via INTRAVENOUS
  Filled 2022-06-29: qty 5000

## 2022-06-29 MED ORDER — POLYETHYLENE GLYCOL 3350 17 G PO PACK: 17.0000 g | PACK | Freq: Every day | ORAL | Status: DC | PRN

## 2022-06-29 MED ORDER — IOHEXOL 350 MG/ML SOLN
125.0000 mL | Freq: Once | INTRAVENOUS | Status: AC | PRN
  Administered 2022-06-29: 125 mL via INTRAVENOUS

## 2022-06-29 NOTE — Progress Notes (Signed)
PHARMACY CONSULT NOTE  Pharmacy Consult for Electrolyte Monitoring and Replacement   Recent Labs: Potassium (mmol/L)  Date Value  06/29/2022 3.4 (L)   Magnesium (mg/dL)  Date Value  16/11/9602 2.0   Calcium (mg/dL)  Date Value  54/10/8117 8.9   Albumin (g/dL)  Date Value  14/78/2956 3.8  10/20/2017 3.8   Phosphorus (mg/dL)  Date Value  21/30/8657 4.2   Sodium (mmol/L)  Date Value  06/29/2022 135  10/20/2017 145 (H)     Assessment: 57 y.o  male with significant PMH of Diabetes Mellitus, schizophrenia, Hypertension, CVA/TIA, polysubstance abuse who presented to the ED with chief complaints of left-sided weakness   Goal of Therapy:  Electrolytes WNL  Plan:  ---10 mEq IV KCl x 1 ---recheck electrolytes in am  Lowella Bandy ,PharmD Clinical Pharmacist 06/29/2022 8:16 AM

## 2022-06-29 NOTE — ED Notes (Addendum)
1610- Code Stroke cart activated- Dr Katrinka Blazing at bedside- pt with reported LKW of 0100 presented via EMS after being found in a parking lot with L side weakness. Glucose 417, pt in DKA, AMS. BP 89/67, HR 107. 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 Door time was 0143 [phone] TS paged [phone] Dr Patricia Nettle on camera [phone] Dr Patricia Nettle spoke with Dr Katrinka Blazing and recommended heparin, ACA occlusion not interveneable.  Laurann Montana, RN Telestroke nurse

## 2022-06-29 NOTE — Progress Notes (Signed)
ANTICOAGULATION CONSULT NOTE  Pharmacy Consult for Heparin  Indication: pulmonary embolus  Allergies  Allergen Reactions   Haldol [Haloperidol Lactate]     Patient Measurements: Height: 5\' 7"  (170.2 cm) Weight: 114.4 kg (252 lb 3.3 oz) IBW/kg (Calculated) : 66.1 Heparin Dosing Weight: 92.2 kg   Vital Signs: Temp: 98.3 F (36.8 C) (05/21 0545) Temp Source: Oral (05/21 0545) BP: 120/85 (05/21 0700) Pulse Rate: 87 (05/21 0700)  Labs: Recent Labs    06/29/22 0243 06/29/22 0446  HGB 13.8 13.2  HCT 43.8 41.7  PLT 197 168  APTT 28  --   LABPROT 16.2*  --   INR 1.3*  --   CREATININE 1.55* 1.40*  TROPONINIHS 362* 466*     Estimated Creatinine Clearance: 71.2 mL/min (A) (by C-G formula based on SCr of 1.4 mg/dL (H)).   Medical History: Past Medical History:  Diagnosis Date   Diabetes mellitus without complication (HCC)    Hypertension    Schizo-affective schizophrenia (HCC)    TIA (transient ischemic attack)     Medications:  Medications Prior to Admission  Medication Sig Dispense Refill Last Dose   LANTUS SOLOSTAR 100 UNIT/ML Solostar Pen Inject 32 Units into the skin daily.      OZEMPIC, 0.25 OR 0.5 MG/DOSE, 2 MG/3ML SOPN Inject 0.5 mg into the skin once a week.      SEROQUEL 100 MG tablet Take 100 mg by mouth at bedtime.      amLODipine (NORVASC) 10 MG tablet Take 1 tablet (10 mg total) by mouth daily. 30 tablet 0    aspirin EC 81 MG EC tablet Take 1 tablet (81 mg total) by mouth daily. 30 tablet 0    atorvastatin (LIPITOR) 40 MG tablet Take 1 tablet (40 mg total) by mouth daily. 30 tablet 0    clopidogrel (PLAVIX) 75 MG tablet Take 1 tablet (75 mg total) by mouth daily. 30 tablet 0    clotrimazole (LOTRIMIN) 1 % cream Apply 1 Application topically 2 (two) times daily. 30 g 0    gabapentin (NEURONTIN) 300 MG capsule Take 1 capsule (300 mg total) by mouth 3 (three) times daily. 90 capsule 0    hydrochlorothiazide (HYDRODIURIL) 25 MG tablet Take 1 tablet (25 mg  total) by mouth daily. 30 tablet 1    insulin aspart protamine- aspart (NOVOLOG MIX 70/30) (70-30) 100 UNIT/ML injection Inject 12 units under the skin daily with breakfast and 11 units daily with dinner 10 mL 1    Insulin Syringe-Needle U-100 (ULTICARE INSULIN SYRINGE) 31G X 1/4" 0.5 ML MISC use with novolog mix 70/30 60 each 0    meloxicam (MOBIC) 15 MG tablet Take 1 tablet (15 mg total) by mouth daily. 30 tablet 0    methocarbamol (ROBAXIN) 500 MG tablet Take 1 tablet (500 mg total) by mouth 4 (four) times daily. 30 tablet 0    risperiDONE (RISPERDAL) 1 MG tablet Take 1 tablet (1 mg total) by mouth at bedtime. 30 tablet 0    sertraline (ZOLOFT) 50 MG tablet Take 1 tablet (50 mg total) by mouth daily. 30 tablet 0    traZODone (DESYREL) 150 MG tablet Take 1 tablet (150 mg total) by mouth at bedtime. 30 tablet 0     Assessment: Pharmacy consulted to dose heparin in this 57 year old male admitted with PE.  No prior anticoag noted.   Goal of Therapy:  Heparin level:  0.3 - 0.7 Monitor platelets by anticoagulation protocol: Yes   Plan: 1st heparin  level slightly supratherapeutic ---reduce heparin infusion rate to 1400 units/hr ---recheck anti-Xa level in 6 hours after rate change and at least once daily while on heparin ---Continue to monitor H&H and platelets  Lowella Bandy 06/29/2022,7:32 AM

## 2022-06-29 NOTE — H&P (Signed)
NAME:  Henry Munoz, MRN:  [id number], DOB:  1966-02-05, LOS: 0 ADMISSION DATE:  06/29/2022, CONSULTATION DATE:  06/29/2022 REFERRING MD: Delton [place], CHIEF COMPLAINT: Left-sided weakness   HPI  57 y.o  male with significant PMH of Diabetes Mellitus, schizophrenia, Hypertension, CVA/TIA, polysubstance abuse who presented to the ED with chief complaints of left-sided weakness.  Per ED report, patient was found in a parking lot by friends who reported onset of symptoms at approximately 0110 with no reports of other associated symptoms.   ED Course: Initial vital signs showed HR of 114 beats/minute, BP 89/70 mm Hg, the RR 28 breaths/minute, and the oxygen saturation 97% on 2 L and a temperature of 100.75F (37.9C).  Finding as above concerning for acute stroke code stroke was activated at 03:15.  Stat CT head, CTA head and neck and CT perfusion study was obtained and patient evaluated by teleneurologist who deemed patient not a candidate for thrombolytics.  Pertinent Labs/Diagnostics Findings: Na+/ K+: 132/3.8 glucose: 444 BUN/Cr.:  18/1.55  AST/ALT: 73/64, beta hydroxybutyrate 3.09 WBC: Hgb/Hct: Plts:  PCT: pending Lactic acid: 2.9 COVID PCR: Negative, Troponin: 362 BNP: 205 UDS +Cocaine VBG:pO2 46; pCO2 43; pH 7.29;  HCO3 20.7, %O2 Sat 20.3.   CTA head and neck showed large bilateral pulmonary arterial filling defect, consistent with acute pulmonary embolism and occlusion of the proximal right A2 segment of the anterior cerebral artery.  Follow-up CTA chest was obtained which confirmed large volume of acute pulmonary emboli affecting all lobar branches with right heart strain.  Patient was started on heparin drip for PE protocol.  PCCM consulted for admission  Past Medical History  Diabetes Mellitus, schizophrenia, Hypertension, CVA/TIA, polysubstance abuse   Significant Hospital Events   5/21: Admit to the ICU with acute multilobar pulmonary embolism with right heart  strain  Consults:  Vascular  Procedures:  None  Significant Diagnostic Tests:   06/29/22: Noncontrast CT head> IMPRESSION: 1. No acute hemorrhage. 2. 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 3. ASPECTS is 10.  06/29/22: CTA Head and Neck> MPRESSION: 1. Large bilateral pulmonary arterial filling defects, consistent with acute pulmonary emboli. 2. 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  06/29/22: CTA Chest IMPRESSION: 1. Large volume of acute pulmonary emboli affecting all lobar branches with right heart strain. 2. Coronary atherosclerosis.  Interim History / Subjective:  -  Micro Data:  5/21: SARS-CoV-2 PCR>  5/21: Influenza PCR>  5/21: Blood culture x2> 5/21: MRSA PCR>>  5/21: Strep pneumo urinary antigen> 5/21: Legionella urinary antigen>  Antimicrobials:  Vancomycin 5/21> Cefepime 5/21>  OBJECTIVE  Blood pressure (!) 89/70, pulse (!) 114, temperature 100.2 F (37.9 C), temperature source Rectal, resp. rate (!) 28, height 5\' 7"  (1.702 m), weight 114.4 kg, SpO2 97 %.       No intake or output data in the 24 hours ending 06/29/22 0354 Filed Weights   06/29/22 0304  Weight: 114.4 kg   Physical Examination  GENERAL: 57 year-old critically ill patient lying in the bed in no acute disress EYES: PEERLA. No scleral icterus. Extraocular muscles intact.  HEENT: Head atraumatic, normocephalic. Oropharynx and nasopharynx clear.  NECK:  No JVD, supple  LUNGS: Normal breath sounds bilaterally.  No use of accessory muscles of respiration.  CARDIOVASCULAR: S1, S2 normal. No murmurs, rubs, or gallops.  ABDOMEN: Soft, NTND EXTREMITIES: Trace edema of bilateral lower extremities no erythema.  Capillary  refill is less than 3 seconds in all extremities. Pulses palpable distally. NEUROLOGIC: The patient is lethargic but awakens easily, follows command, oriented x 4.  No focal neurological deficit.  Cranial nerves are intact.  SKIN: No obvious rash, lesion, or ulcer. Warm to touch Labs/imaging that I havepersonally reviewed  (right click and "Reselect all SmartList Selections" daily)     Labs   CBC: Recent Labs  Lab 06/29/22 0243  WBC 7.6  NEUTROABS 5.6  HGB 13.8  HCT 43.8  MCV 97.1  PLT 197    Basic Metabolic Panel: Recent Labs  Lab 06/29/22 0243  NA 132*  K 3.8  CL 97*  CO2 18*  GLUCOSE 444*  BUN 18  CREATININE 1.55*  CALCIUM 8.9   GFR: Estimated Creatinine Clearance: 64.3 mL/min (A) (by C-G formula based on SCr of 1.55 mg/dL (H)). Recent Labs  Lab 06/29/22 0243  WBC 7.6    Liver Function Tests: Recent Labs  Lab 06/29/22 0243  AST 73*  ALT 64*  ALKPHOS 89  BILITOT 1.3*  PROT 6.8  ALBUMIN 3.8   No results for input(s): "LIPASE", "AMYLASE" in the last 168 hours. No results for input(s): "AMMONIA" in the last 168 hours.  ABG    Component Value Date/Time   HCO3 20.7 06/29/2022 0220   ACIDBASEDEF 5.7 (H) 06/29/2022 0220   O2SAT 70.3 06/29/2022 0220     Coagulation Profile: Recent Labs  Lab 06/29/22 0243  INR 1.3*    Cardiac Enzymes: No results for input(s): "CKTOTAL", "CKMB", "CKMBINDEX", "TROPONINI" in the last 168 hours.  HbA1C: Hgb A1c MFr Bld  Date/Time Value Ref Range Status  10/20/2017 07:21 PM 5.9 (H) 4.8 - 5.6 % Final    Comment:             Prediabetes: 5.7 - 6.4          Diabetes: >6.4          Glycemic control for adults with diabetes: <7.0   08/10/2016 06:39 AM 12.6 (H) 4.8 - 5.6 % Final    Comment:    (NOTE)         Pre-diabetes: 5.7 - 6.4         Diabetes: >6.4         Glycemic control for adults with diabetes: <7.0     CBG: Recent Labs  Lab 06/29/22 0204  GLUCAP 417*    Review of Systems:   Unable to be obtained secondary to the patient's lethargic and poor historian   Past Medical History  He,  has a past medical history of Diabetes mellitus without complication  (HCC), Hypertension, Schizo-affective schizophrenia (HCC), and TIA (transient ischemic attack).   Surgical History   No past surgical history on file.   Social History   reports that he has been smoking cigarettes. He has been smoking an average of .5 packs per day. He has never used smokeless tobacco. He reports current alcohol use of about 2.0 standard drinks of alcohol per week. He reports current drug use. Drug: Cocaine.   Family History   His family history is not on file.   Allergies Allergies  Allergen Reactions   Haldol [Haloperidol Lactate]      Home Medications  Prior to Admission medications   Medication Sig Start Date End Date Taking? Authorizing Provider  amLODipine (NORVASC) 10 MG tablet Take 1 tablet (10 mg total) by mouth daily. 10/13/17   Erasmo Downer, MD  aspirin EC 81 MG EC tablet Take  1 tablet (81 mg total) by mouth daily. 08/13/16   Jimmy Footman, MD  atorvastatin (LIPITOR) 40 MG tablet Take 1 tablet (40 mg total) by mouth daily. 10/13/17   Erasmo Downer, MD  clopidogrel (PLAVIX) 75 MG tablet Take 1 tablet (75 mg total) by mouth daily. 10/13/17   Erasmo Downer, MD  clotrimazole (LOTRIMIN) 1 % cream Apply 1 Application topically 2 (two) times daily. 12/02/21   Minna Antis, MD  gabapentin (NEURONTIN) 300 MG capsule Take 1 capsule (300 mg total) by mouth 3 (three) times daily. 10/13/17   Erasmo Downer, MD  hydrochlorothiazide (HYDRODIURIL) 25 MG tablet Take 1 tablet (25 mg total) by mouth daily. 10/13/17   Erasmo Downer, MD  insulin aspart protamine- aspart (NOVOLOG MIX 70/30) (70-30) 100 UNIT/ML injection Inject 12 units under the skin daily with breakfast and 11 units daily with dinner 12/02/21   Minna Antis, MD  Insulin Syringe-Needle U-100 Stann Ore INSULIN SYRINGE) 31G X 1/4" 0.5 ML MISC use with novolog mix 70/30 12/02/21   Minna Antis, MD  meloxicam (MOBIC) 15 MG tablet Take 1 tablet (15 mg total) by mouth  daily. 10/26/21 10/26/22  Cuthriell, Delorise Royals, PA-C  methocarbamol (ROBAXIN) 500 MG tablet Take 1 tablet (500 mg total) by mouth 4 (four) times daily. 10/26/21   Cuthriell, Delorise Royals, PA-C  risperiDONE (RISPERDAL) 1 MG tablet Take 1 tablet (1 mg total) by mouth at bedtime. 10/13/17   Erasmo Downer, MD  sertraline (ZOLOFT) 50 MG tablet Take 1 tablet (50 mg total) by mouth daily. 10/13/17   Erasmo Downer, MD  traZODone (DESYREL) 150 MG tablet Take 1 tablet (150 mg total) by mouth at bedtime. 10/13/17   Erasmo Downer, MD    Scheduled Meds: Continuous Infusions:  ceFEPime (MAXIPIME) IV Stopped (06/29/22 0505)   dextrose 5% lactated ringers Stopped (06/29/22 0421)   heparin 1,500 Units/hr (06/29/22 0543)   insulin 9.5 Units/hr (06/29/22 0543)   lactated ringers     lactated ringers 125 mL/hr at 06/29/22 0543   [START ON 06/30/2022] vancomycin     vancomycin 1,500 mg (06/29/22 0604)   PRN Meds:.dextrose, docusate sodium, polyethylene glycol  Active Hospital Problem list     Assessment & Plan:  #Acute Multilobar Pulmonary embolism  ?Cocaine Induced thrombosis Elevated Troponin- likely demand in the setting of PE  CTA showed bilateral submassive PE with CT evidence of right heart strain.  Patient is hemodynamically stable.  Heart rate 114, oxygen saturation 96% on 2L  -Supplemental O2 as needed to maintain O2 > 92% -heparin drip initiated -2D echocardiogram ordered -LE dopplers ordered to evaluate for DVT -PRN Vasopressor to keep MAP>65 -prn albuterol nebs and mucinex  -Vascular Consult. Discuss with Dr.Schnier with plans for thrombectomy in the am  Left sided weakness  Initial concerns for ischemic event in a patient with  hx of stroke and risk factor and small vessel disease (DM, HTN, HLD, polysubstane abuse). Patient was on DUAP Aspirin and Plavix prior to event. -CTA head and neck reviewed and shows no acute intracranial abnormality or LVO -Check HgbA1c, fasting lipid  panel -Obtain MRI  of the brain without contrast -PT consult, OT consult, Speech consult -Echocardiogram -NPO until RN stroke swallow screen -Frequent neuro checks -Neurology consult  #DKA  #Hx of T2DM -check Hemoglobin A1c -Check CXR, UA, Blood Cultures for infection -Troponin, EKG for ischemia -check Lipase  -Continue Insulin drip per DKA protocol  with Glucose check: q1h to titrate insulin -Q4h BMP+Phosphorus+Mag +  pH (ABG/VBG)  -Keep NPO -IVFs -Diabetes coordinator consult  #Suspected Sepsis of unknown source meets SIRS criteria: Heart Rate 114 beats/minute, Respiratory Rate 28 breaths/minute,Temperature 100.2,  -F/u cultures, trend lactic/ PCT -Monitor WBC/ fever curve -Given hx of cocaine use, low grade fever, will obtain Bcx, Echo and start on empiric abx cefepime & vancomycin  -IVF hydration as -Strict I/O's   #Acute Kidney Injury #Hyponatremia- likely pseudohyponatremia in the setting of DKA correct for hyperglycemia -Monitor I&O's / urinary output -Follow BMP -Foley catheter for high urine output monitoring -Ensure adequate renal perfusion -Avoid nephrotoxic agents as able -Replace electrolytes as indicated   #Hx of schizophrenia -Continue Trazodone, Zoloft, Seroquel, Risperidone once able to take po  Best practice:  Diet:  NPO Pain/Anxiety/Delirium protocol (if indicated): No VAP protocol (if indicated): Not indicated DVT prophylaxis: Systemic AC GI prophylaxis: N/A Glucose control:  Insulin gtt Central venous access:  N/A Arterial line:  N/A Foley:  Yes, and it is still needed Mobility:  bed rest  PT consulted: N/A Last date of multidisciplinary goals of care discussion [5/21] Code Status:  full code Disposition: ICU   = Goals of Care = Code Status Order: FULL  Primary Emergency Contact: Hartsough,Sarah, Home Phone: [phone] Wishes to pursue full aggressive treatment and intervention options, including CPR and intubation, but goals of care will  be addressed on going with family if that should become necessary.   Critical care time: 45 minutes      Webb Silversmith DNP, CCRN, FNP-C, AGACNP-BC Acute Care & Family Nurse Practitioner [facility] PCCM on call pager [phone]

## 2022-06-29 NOTE — Progress Notes (Signed)
Pharmacy Antibiotic Note  Henry Munoz is a 57 y.o. male admitted on 06/29/2022 with sepsis.  Pharmacy has been consulted for Cefepime, Vancomycin dosing.  Plan: Cefepime 2 gm IV Q8H ordered to start on 5/21 @ 0400.  Vancomycin 1 gm IV X 1 followed by 1500 mg IV X 1 ordered for 5/21 @ ~ 0400 as loading dose. Vancomycin 1250 mg IV Q24H ordered to start on 5/22 @ ~ 0500.  AUC = 478.2 Vanc trough = 11.7   Height: 5\' 7"  (170.2 cm) Weight: 114.4 kg (252 lb 3.3 oz) IBW/kg (Calculated) : 66.1  Temp (24hrs), Avg:100.2 F (37.9 C), Min:100.2 F (37.9 C), Max:100.2 F (37.9 C)  Recent Labs  Lab 06/29/22 0243  WBC 7.6  CREATININE 1.55*    Estimated Creatinine Clearance: 64.3 mL/min (A) (by C-G formula based on SCr of 1.55 mg/dL (H)).    Allergies  Allergen Reactions   Haldol [Haloperidol Lactate]     Antimicrobials this admission:   >>    >>   Dose adjustments this admission:   Microbiology results:  BCx:   UCx:    Sputum:    MRSA PCR:   Thank you for allowing pharmacy to be a part of this patient's care.  Crosley Stejskal D 06/29/2022 4:06 AM

## 2022-06-29 NOTE — Progress Notes (Signed)
Critical labs received from lab : relayed to NP and pt Nurse : potassium 6.8, troponin 466, lactic 2.9

## 2022-06-29 NOTE — Inpatient Diabetes Management (Signed)
Inpatient Diabetes Program Recommendations  AACE/ADA: New Consensus Statement on Inpatient Glycemic Control (2015)  Target Ranges:  Prepandial:   less than 140 mg/dL      Peak postprandial:   less than 180 mg/dL (1-2 hours)      Critically ill patients:  140 - 180 mg/dL   Lab Results  Component Value Date   GLUCAP 197 (H) 06/29/2022   HGBA1C 5.9 (H) 10/20/2017    Latest Reference Range & Units 06/29/22 07:07  Sodium 135 - 145 mmol/L 135  Potassium 3.5 - 5.1 mmol/L 3.4 (L)  Chloride 98 - 111 mmol/L 103  CO2 22 - 32 mmol/L 21 (L)  Glucose 70 - 99 mg/dL 161 (H)  BUN 6 - 20 mg/dL 17  Creatinine 0.96 - 0.45 mg/dL 4.09 (H)  Calcium 8.9 - 10.3 mg/dL 8.9  Anion gap 5 - 15  11  (L): Data is abnormally low (H): Data is abnormally high  Latest Reference Range & Units 06/29/22 07:07  Lactic Acid, Venous 0.5 - 1.9 mmol/L 2.4 (HH)  (HH): Data is critically high  Diabetes history: DM2 Outpatient Diabetes medications: Novolog 70/30 12 units ac breakfast, 11 units ac dinner Current orders for Inpatient glycemic control: IV insulin  Inpatient Diabetes Program Recommendations:   When ready to transition to subcutaneous insulin, please consider: -Semglee 16 units qd (give first dose 2 hrs. Prior to D/C insulin drip) -Add Novolog 3 units tid meal coverage when eating @ least 50% meal -Novolog correction 0-9 units q 4 hrs. If NPO or tid, 0-5 units hs if eating (cover with correction when IV insulin discontinued)  Thank you, Billy Fischer. Coren Sagan, RN, MSN, CDE  Diabetes Coordinator Inpatient Glycemic Control Team Team Pager [phone] (8am-5pm) 06/29/2022 9:14 AM

## 2022-06-29 NOTE — Progress Notes (Signed)
eLink Physician-Brief Progress Note Patient Name: Amad Formoso DOB: 01/10/1966 MRN: [id number]   Date of Service  06/29/2022  HPI/Events of Note  57 year old male that initially presented with diaphoresis with a chief complaint of left-sided weakness found down by friends in a parking lot with last known well of 110.  Stroke alert was initiated and CT results were consistent with a right proximal ACA occlusion.  Intervention was not recommended and the patient was started on heparin drip.  Treated empirically with antibiotics.  Initial vitals show mild hypotension, tachycardia, tachypnea.  All vitals are improved since admission.  NIH stroke scale of 6  Initial metabolic panel shows acute kidney injury with creatinine of 1.4 complicated by hyperkalemia of 6.8 mild troponin elevation lactic acidosis at 2 point and essentially normal CBC.  Beta hydroxybutyrate elevated, glucose is elevated.  Unremarkable chest x-ray and abdominal film. ECG consistent with RV strain.  QTc 452.  On CT pulmonary exam,bilateral pulmonary emboli present.   No previous head trauma related bleeding, no obvious intracranial hemorrhage.  tPA for stroke was deferred due to unknown last known and out of time window.  Otherwise technically no contraindications to tPA for PE if necessary.  Is only received 1 L and fluids.  Appears to be in diabetic ketoacidosis.  eICU Interventions  For DKA, recommend additional 1 L of LR (ordered) and maintenance insulin drip.  Maintenance LR/LR with dextrose per DKA protocol.  Recheck lactate and metabolic panel in 4 hours.  With regards to his PE, echocardiogram has been ordered.  Given troponin elevation, elevated BNP, and RV strain on CT PE, he qualifies as a submassive pulmonary embolus.  In the setting of concurrent stroke, catheter directed intervention is relatively contraindicated.  Maintain therapeutic heparin drip.  If he develops worsening hypotension, low threshold to  push tPA.  Would obtain lower extremity Dopplers and assess previous age-appropriate cancer screening  With regards to his stroke, MR is pending, stroke is pending, urine drug screen consistent with cocaine.  A1c was sent, will add on lipid panel.  Unclear indication for antibiotics although given hypotension and multiorgan dysfunction, not unreasonable to maintain for now.  Add on procalcitonin.  Hold home antiplatelet agents, resume statins.  Standard hyperkalemia protocol while waiting for insulin/fluids to kick in.  Target SBP less than 180.  Monitor for withdrawal from cocaine     Intervention Category Evaluation Type: New Patient Evaluation  Yonah Tangeman 06/29/2022, 5:58 AM

## 2022-06-29 NOTE — Progress Notes (Signed)
ANTICOAGULATION CONSULT NOTE - Initial Consult  Pharmacy Consult for Heparin  Indication: pulmonary embolus  Allergies  Allergen Reactions   Haldol [Haloperidol Lactate]     Patient Measurements: Height: 5\' 7"  (170.2 cm) Weight: 114.4 kg (252 lb 3.3 oz) IBW/kg (Calculated) : 66.1 Heparin Dosing Weight: 92.2 kg   Vital Signs: Temp: 100.2 F (37.9 C) (05/21 0304) Temp Source: Rectal (05/21 0304) BP: 89/70 (05/21 0249) Pulse Rate: 114 (05/21 0249)  Labs: Recent Labs    06/29/22 0243  HGB 13.8  HCT 43.8  PLT 197  APTT 28  LABPROT 16.2*  INR 1.3*  CREATININE 1.55*  TROPONINIHS 362*    Estimated Creatinine Clearance: 64.3 mL/min (A) (by C-G formula based on SCr of 1.55 mg/dL (H)).   Medical History: Past Medical History:  Diagnosis Date   Diabetes mellitus without complication (HCC)    Hypertension    Schizo-affective schizophrenia (HCC)    TIA (transient ischemic attack)     Medications:  (Not in a hospital admission)   Assessment: Pharmacy consulted to dose heparin in this 57 year old male admitted with PE.  No prior anticoag noted.  CrCl = 64.3 ml/min  Goal of Therapy:  HL:  0.3 - 0.7 Monitor platelets by anticoagulation protocol: Yes   Plan:  Give 5000 units bolus x 1 Start heparin infusion at 1500 units/hr Check anti-Xa level in 6 hours and daily while on heparin Continue to monitor H&H and platelets  Elanie Hammitt D 06/29/2022,3:31 AM

## 2022-06-29 NOTE — ED Notes (Signed)
Report given to Shari Prows (receiving ICU RN). Pt belongings put in single belongings bag and transported with pt to ICU 11. Belongings include: phone, pants, underwear, jacket, and pair of shoes.

## 2022-06-29 NOTE — Progress Notes (Signed)
*  PRELIMINARY RESULTS* Echocardiogram 2D Echocardiogram has been performed.  Henry Munoz 06/29/2022, 1:54 PM

## 2022-06-29 NOTE — Consult Note (Signed)
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    After further questioning the patient he states he has had a stroke in the past and did have residual left-sided weakness    It is possible his current weakness at this time is recrudescence    Main concern however is the large bilateral pulmonary embolisms and the patient does need to be urgently started on anticoagulation which I do feel is reasonable at this point    There is no hemorrhage on the noncontrast CT of the head    I would recommend proceeding with heparin drip    Consider serial noncontrast CTs of the head while he is anticoagulated    MRI of the brain without contrast    Patient is not an IV thrombolytic candidate he is out of the time window were unsure of his last known normal time      Discussed with NIR Text: likely old and chronic  Our recommendations are outlined below.  Recommendations:        Stroke/Telemetry Floor       Neuro Checks       Bedside Swallow Eval       DVT Prophylaxis       IV Fluids, Normal Saline       Head of Bed 30 Degrees       Euglycemia and Avoid Hyperthermia (PRN Acetaminophen)       Antihypertensives PRN if Blood pressure is greater than 220/120 or there is a concern for End organ damage/contraindications for permissive HTN. If blood pressure is greater than 220/120 give labetalol PO or IV or Vasotec IV with a goal of 15% reduction in BP during the first 24 hours.  Sign Out:       Discussed with Emergency  Department Provider    ------------------------------------------------------------------------------  Advanced Imaging: CTA Head and Neck Completed.  CTP Completed.  LVO:Yes  Discussed with NIR :No  Discussed with NIR Text : likely old and chronic   Metrics: Last Known Well: Unknown [facility] Notification Time: 06/29/2022 03:18:51 Arrival Time: 06/29/2022 01:43:00 Stamp Time: 06/29/2022 03:18:51 Initial Response Time: 06/29/2022 03:21:08 Symptoms: L sided weakness . Initial patient interaction: 06/29/2022 03:25:44 NIHSS Assessment Completed: 06/29/2022 03:29:32 Patient is not a candidate for Thrombolytic. Thrombolytic Medical Decision: 06/29/2022 03:29:35 Patient was not deemed candidate for Thrombolytic because of following reasons: Last Well Known Above 4.5 Hours.  CT head showed no acute hemorrhage or acute core infarct.  Primary Provider Notified of Diagnostic Impression and Management Plan on: 06/29/2022 03:30:49    ------------------------------------------------------------------------------  History of Present Illness: Patient is a 57 year old Male.  Patient was brought by EMS for symptoms of L sided weakness . Patient with a past medical history significant for prior stroke with questionable left-sided residual weakness comes into the emergency department for shortness of breath and diaphoresis with an unknown last normal time  Blood sugar quite elevated found to be in DKA and CTA head and neck did reveal bilateral pulmonary  embolisms  CTA head and neck also revealed occlusion of the proximal right A2 segment they note this is new since 2017 continue source of the left-sided weakness      Past Medical History:      Stroke      There is no history of Hypertension  Medications:  No Anticoagulant use  Antiplatelet use: Yes ASA and Plavix Reviewed EMR for current medications  Allergies:  Reviewed  Social History: Drug Use: No  Family  History:  There is no family history of premature cerebrovascular disease pertinent to this consultation  ROS : 14 Points Review of Systems was performed and was negative except mentioned in HPI.  Past Surgical History: There Is No Surgical History Contributory To Today's Visit     Examination: BP(89/70), Pulse(114), Blood Glucose(400) 1A: Level of Consciousness - Alert; keenly responsive + 0 1B: Ask Month and Age - Both Questions Right + 0 1C: Blink Eyes & Squeeze Hands - Performs Both Tasks + 0 2: Test Horizontal Extraocular Movements - Normal + 0 3: Test Visual Fields - No Visual Loss + 0 4: Test Facial Palsy (Use Grimace if Obtunded) - Partial paralysis (lower face) + 2 5A: Test Left Arm Motor Drift - Drift, hits bed + 2 5B: Test Right Arm Motor Drift - No Drift for 10 Seconds + 0 6A: Test Left Leg Motor Drift - Drift, hits bed + 2 6B: Test Right Leg Motor Drift - No Drift for 5 Seconds + 0 7: Test Limb Ataxia (FNF/Heel-Shin) - No Ataxia + 0 8: Test Sensation - Normal; No sensory loss + 0 9: Test Language/Aphasia - Normal; No aphasia + 0 10: Test Dysarthria - Normal + 0 11: Test Extinction/Inattention - No abnormality + 0  NIHSS Score: 6   Pre-Morbid Modified Rankin Scale: 1 Points = No significant disability despite symptoms; able to carry out all usual duties and activities  Spoke with : ED DR  Patient/Family was informed the Neurology Consult would occur via TeleHealth consult by way of interactive audio and video telecommunications and consented to receiving care in this manner.   Patient is being evaluated for possible acute neurologic impairment and high probability of imminent or life-threatening deterioration. I spent total of 35 minutes providing care to this patient, including time for face to face visit via telemedicine, review of medical records, imaging studies and discussion of findings with providers, the patient and/or family.   Dr Glena Norfolk   [facility] For Inpatient follow-up with [facility] physician please call [facility] [phone]. This is not an outpatient service. Post hospital discharge, please contact hospital directly.  Please do not communicate with [facility] physicians via secure chat. If you have any questions, Please contact [facility]. Please call or reconsult our service if there are any clinical or diagnostic changes.

## 2022-06-29 NOTE — Progress Notes (Signed)
ANTICOAGULATION CONSULT NOTE  Pharmacy Consult for Heparin  Indication: pulmonary embolus  Allergies  Allergen Reactions   Haldol [Haloperidol Lactate]     Patient Measurements: Height: 5\' 7"  (170.2 cm) Weight: 114.4 kg (252 lb 3.3 oz) IBW/kg (Calculated) : 66.1 Heparin Dosing Weight: 92.2 kg   Vital Signs: Temp: 98 F (36.7 C) (05/21 1200) Temp Source: Oral (05/21 1200) BP: 120/94 (05/21 1400) Pulse Rate: 87 (05/21 1400)  Labs: Recent Labs    06/29/22 0243 06/29/22 0446 06/29/22 0707 06/29/22 1129 06/29/22 1142 06/29/22 2020  HGB 13.8 13.2  --   --   --   --   HCT 43.8 41.7  --   --   --   --   PLT 197 168  --   --   --   --   APTT 28  --   --   --   --   --   LABPROT 16.2*  --   --   --   --   --   INR 1.3*  --   --   --   --   --   HEPARINUNFRC  --   --  0.81* 0.74*  --  0.39  CREATININE 1.55* 1.40* 1.28* 1.05 1.12  --   TROPONINIHS 362* 466*  --   --   --   --      Estimated Creatinine Clearance: 89 mL/min (by C-G formula based on SCr of 1.12 mg/dL).   Medical History: Past Medical History:  Diagnosis Date   Diabetes mellitus without complication (HCC)    Hypertension    Schizo-affective schizophrenia (HCC)    TIA (transient ischemic attack)     Medications:  Medications Prior to Admission  Medication Sig Dispense Refill Last Dose   clotrimazole (LOTRIMIN) 1 % cream Apply 1 Application topically 2 (two) times daily. 30 g 0 unknown   LANTUS SOLOSTAR 100 UNIT/ML Solostar Pen Inject 32 Units into the skin daily.   Past Week   aspirin EC 81 MG EC tablet Take 1 tablet (81 mg total) by mouth daily. (Patient not taking: Reported on 06/29/2022) 30 tablet 0 Not Taking   atorvastatin (LIPITOR) 40 MG tablet Take 1 tablet (40 mg total) by mouth daily. (Patient not taking: Reported on 06/29/2022) 30 tablet 0 Not Taking   clopidogrel (PLAVIX) 75 MG tablet Take 1 tablet (75 mg total) by mouth daily. (Patient not taking: Reported on 06/29/2022) 30 tablet 0 Not Taking    insulin aspart protamine- aspart (NOVOLOG MIX 70/30) (70-30) 100 UNIT/ML injection Inject 12 units under the skin daily with breakfast and 11 units daily with dinner (Patient not taking: Reported on 06/29/2022) 10 mL 1 Not Taking   Insulin Syringe-Needle U-100 (ULTICARE INSULIN SYRINGE) 31G X 1/4" 0.5 ML MISC use with novolog mix 70/30 60 each 0    OZEMPIC, 0.25 OR 0.5 MG/DOSE, 2 MG/3ML SOPN Inject 0.5 mg into the skin once a week. (Patient not taking: Reported on 06/29/2022)   Not Taking   risperiDONE (RISPERDAL) 1 MG tablet Take 1 tablet (1 mg total) by mouth at bedtime. (Patient not taking: Reported on 06/29/2022) 30 tablet 0 Not Taking   SEROQUEL 100 MG tablet Take 100 mg by mouth at bedtime. (Patient not taking: Reported on 06/29/2022)   Not Taking   sertraline (ZOLOFT) 50 MG tablet Take 1 tablet (50 mg total) by mouth daily. (Patient not taking: Reported on 06/29/2022) 30 tablet 0 Not Taking   traZODone (DESYREL) 150 MG  tablet Take 1 tablet (150 mg total) by mouth at bedtime. (Patient not taking: Reported on 06/29/2022) 30 tablet 0 Not Taking    Assessment: Pharmacy consulted to dose heparin in this 57 year old male admitted with PE.  No prior anticoag noted.   Goal of Therapy:  Heparin level:  0.3 - 0.7 Monitor platelets by anticoagulation protocol: Yes   Plan: therapeutic x 1 ---Continue heparin infusion rate of 1400 units/hr ---Check confirmatory anti-Xa level in 6 hours and at least once daily while on heparin ---Continue to monitor H&H and platelets  Bettey Costa 06/29/2022,9:04 PM

## 2022-06-29 NOTE — ED Triage Notes (Signed)
Pt arrives via [facility] with CC of L sided weakness. Pt was found in parking lot by friends and friends reports symptoms started at approx 0110. Pt diaphoretic, A&O - MD at bedside. Going to CT 1

## 2022-06-29 NOTE — H&P (View-Only) (Signed)
MRN : [id number]  Jaemin Babilonia is a 57 y.o. (May 19, 1965) male who presents with chief complaint of legs hurt and swell.  History of Present Illness:   I am asked to see the patient by Dr. Anna Genre for bilateral pulmonary emboli.  The patient is a 57 year old gentleman who presented to [facility] emergency room last night for weakness and mental status changes.  He was essentially found down in a parking lot and brought to the emergency room by EMS.  On arrival he had poor mentation associated with left-sided weakness.  He was also noted to be tachypneic and tachycardic with poor oxygen saturations.  CT angiogram of the chest was obtained which demonstrated massive pulmonary emboli.  Patient does not have a history of DVT or PE.  He does have a history of substance abuse.  His talk screen on admission is positive for cocaine.  I have personally reviewed the CT scan and it does show a massive pulmonary emboli bilaterally.  Current Meds  Medication Sig   LANTUS SOLOSTAR 100 UNIT/ML Solostar Pen Inject 32 Units into the skin daily.    Past Medical History:  Diagnosis Date   Diabetes mellitus without complication (HCC)    Hypertension    Schizo-affective schizophrenia (HCC)    TIA (transient ischemic attack)     No past surgical history on file.  Social History Social History   Tobacco Use   Smoking status: Every Day    Packs/day: .5    Types: Cigarettes   Smokeless tobacco: Never  Substance Use Topics   Alcohol use: Yes    Alcohol/week: 2.0 standard drinks of alcohol    Types: 2 Cans of beer per week    Comment: per week   Drug use: Yes    Types: Cocaine    Family History No family history on file.  Allergies  Allergen Reactions   Haldol [Haloperidol Lactate]      REVIEW OF SYSTEMS (Negative unless checked)  Constitutional: [] Weight loss  [] Fever  [] Chills Cardiac: [] Chest pain   [] Chest pressure   [] Palpitations   [] Shortness of  breath when laying flat   [] Shortness of breath with exertion. Vascular:  [] Pain in legs with walking   [x] Pain in legs at rest  [] History of DVT   [] Phlebitis   [x] Swelling in legs   [] Varicose veins   [] Non-healing ulcers Pulmonary:   [] Uses home oxygen   [] Productive cough   [] Hemoptysis   [] Wheeze  [] COPD   [] Asthma Neurologic:  [] Dizziness   [] Seizures   [] History of stroke   [] History of TIA  [] Aphasia   [] Vissual changes   [] Weakness or numbness in arm   [] Weakness or numbness in leg Musculoskeletal:   [] Joint swelling   [] Joint pain   [] Low back pain Hematologic:  [] Easy bruising  [] Easy bleeding   [] Hypercoagulable state   [] Anemic Gastrointestinal:  [] Diarrhea   [] Vomiting  [] Gastroesophageal reflux/heartburn   [] Difficulty swallowing. Genitourinary:  [] Chronic kidney disease   [] Difficult urination  [] Frequent urination   [] Blood in urine Skin:  [] Rashes   [] Ulcers  Psychological:  [] History of anxiety   []  History of major depression.  Physical Examination  Vitals:   06/29/22 0700 06/29/22 0800 06/29/22 0900 06/29/22 1000  BP: 120/85 (!) 109/94 (!) 124/93 114/86  Pulse: 87 87 87 91  Resp: 20 10 (!) 23 (!) 0  Temp:  98.6 F (37 C)    TempSrc:  Oral  SpO2: 95% 96% 92% 97%  Weight:      Height:       Body mass index is 39.5 kg/m. Gen: WD/WN, NAD Head: [place]/AT, No temporalis wasting.  Ear/Nose/Throat: Hearing grossly intact, nares w/o erythema or drainage, pinna without lesions Eyes: PER, EOMI, sclera nonicteric.  Neck: Supple, no gross masses.  No JVD.  Pulmonary:  Good air movement, no audible wheezing, no use of accessory muscles.  Cardiac: RRR, precordium not hyperdynamic. Vascular:   2+ soft pitting edema. CEAP [id number]   Vessel Right Left  Radial Palpable Palpable  Gastrointestinal: soft, non-distended. No guarding/no peritoneal signs.  Musculoskeletal: M/S 5/5 throughout.  No deformity.  Neurologic: CN 2-12 intact. Pain and light touch intact in extremities.   Symmetrical.  Speech is fluent. Motor exam as listed above. Psychiatric: Judgment intact, Mood & affect appropriate for pt's clinical situation. Dermatologic: Venous rashes no ulcers noted.  No changes consistent with cellulitis. Lymph : No lichenification or skin changes of chronic lymphedema.  CBC Lab Results  Component Value Date   WBC 10.0 06/29/2022   HGB 13.2 06/29/2022   HCT 41.7 06/29/2022   MCV 97.2 06/29/2022   PLT 168 06/29/2022    BMET    Component Value Date/Time   NA 135 06/29/2022 0707   NA 145 (H) 10/20/2017 1921   K 3.4 (L) 06/29/2022 0707   CL 103 06/29/2022 0707   CO2 21 (L) 06/29/2022 0707   GLUCOSE 245 (H) 06/29/2022 0707   BUN 17 06/29/2022 0707   BUN 13 10/20/2017 1921   CREATININE 1.28 (H) 06/29/2022 0707   CALCIUM 8.9 06/29/2022 0707   GFRNONAA >60 06/29/2022 0707   GFRAA 114 10/20/2017 1921   Estimated Creatinine Clearance: 77.8 mL/min (A) (by C-G formula based on SCr of 1.28 mg/dL (H)).  COAG Lab Results  Component Value Date   INR 1.3 (H) 06/29/2022   INR 1.02 01/30/2016    Radiology DG Abd 1 View  Result Date: 06/29/2022 CLINICAL DATA:  MRI clearance. EXAM: ABDOMEN - 1 VIEW COMPARISON:  None Available. FINDINGS: Mild gaseous distention of the stomach. No gaseous dilatation of small bowel or colon. Visualized bony anatomy unremarkable. Foley catheter overlies the low pelvis. No unexpected radiopaque/metallic foreign body identified over the visualized abdomen or pelvis. IMPRESSION: 1. No unexpected radiopaque/metallic foreign body. 2. Mild gaseous distention of the stomach. Electronically Signed   By: Kennith Center M.D.   On: 06/29/2022 05:15   DG Chest Port 1 View  Result Date: 06/29/2022 CLINICAL DATA:  MRI clearance. EXAM: PORTABLE CHEST 1 VIEW COMPARISON:  10/09/2017 FINDINGS: The lungs are clear without focal pneumonia, edema, pneumothorax or pleural effusion. Cardiopericardial silhouette is at upper limits of normal for size. No acute  bony abnormality. Telemetry leads overlie the chest. No evidence for unexpected radiopaque foreign body overlying the visualized thorax. IMPRESSION: 1. No acute cardiopulmonary findings. 2. No evidence for unexpected radiopaque/metallic foreign body. Electronically Signed   By: Kennith Center M.D.   On: 06/29/2022 05:13   CT Angio Chest PE W and/or Wo Contrast  Result Date: 06/29/2022 CLINICAL DATA:  Diaphoresis.  Found in parking lot EXAM: CT ANGIOGRAPHY CHEST WITH CONTRAST TECHNIQUE: Multidetector CT imaging of the chest was performed using the standard protocol during bolus administration of intravenous contrast. Multiplanar CT image reconstructions and MIPs were obtained to evaluate the vascular anatomy. RADIATION DOSE REDUCTION: This exam was performed according to the departmental dose-optimization program which includes automated exposure control, adjustment of the mA and/or kV  according to patient size and/or use of iterative reconstruction technique. CONTRAST:  OMNIPAQUE IOHEXOL 350 MG/ML SOLN COMPARISON:  None Available. FINDINGS: Cardiovascular: Satisfactory opacification of the pulmonary arteries to the segmental level. Large volume of acute appearing clot in the bilateral main pulmonary arteries extending into all lobes with multiple occlusive segmental branches. Right heart strain which may be accentuated by left ventricular hypertrophy, RV to LV ratio measures 2.2. Atheromatous plaque along the coronaries. Mediastinum/Nodes: No mass or adenopathy Lungs/Pleura: No failure or infarct seen. Upper Abdomen: Contrast in the kidneys from recent CTA of the head and neck Musculoskeletal: No acute finding. Review of the MIP images confirms the above findings. IMPRESSION: 1. Large volume of acute pulmonary emboli affecting all lobar branches with right heart strain. 2. Coronary atherosclerosis. Electronically Signed   By: Tiburcio Pea M.D.   On: 06/29/2022 04:09   CT CEREBRAL PERFUSION W  CONTRAST  Result Date: 06/29/2022 CLINICAL DATA:  Left-sided weakness EXAM: CT ANGIOGRAPHY HEAD AND NECK CT PERFUSION BRAIN TECHNIQUE: Multidetector CT imaging of the head and neck was performed using the standard protocol during bolus administration of intravenous contrast. Multiplanar CT image reconstructions and MIPs were obtained to evaluate the vascular anatomy. Carotid stenosis measurements (when applicable) are obtained utilizing NASCET criteria, using the distal internal carotid diameter as the denominator. Multiphase CT imaging of the brain was performed following IV bolus contrast injection. Subsequent parametric perfusion maps were calculated using RAPID software. RADIATION DOSE REDUCTION: This exam was performed according to the departmental dose-optimization program which includes automated exposure control, adjustment of the mA and/or kV according to patient size and/or use of iterative reconstruction technique. CONTRAST:  OMNIPAQUE IOHEXOL 350 MG/ML SOLN COMPARISON:  None Available. FINDINGS: CTA NECK FINDINGS SKELETON: There is no bony spinal canal stenosis. No lytic or blastic lesion. OTHER NECK: Normal pharynx, larynx and major salivary glands. No cervical lymphadenopathy. Unremarkable thyroid gland. UPPER CHEST: There are large bilateral pulmonary arterial filling defects. AORTIC ARCH: There is no calcific atherosclerosis of the aortic arch. There is no aneurysm, dissection or hemodynamically significant stenosis of the visualized portion of the aorta. Conventional 3 vessel aortic branching pattern. The visualized proximal subclavian arteries are widely patent. RIGHT CAROTID SYSTEM: Normal without aneurysm, dissection or stenosis. LEFT CAROTID SYSTEM: No dissection, occlusion or aneurysm. There is mixed density atherosclerosis extending into the proximal ICA, resulting in less than 50% stenosis. VERTEBRAL ARTERIES: Codominant configuration. Both origins are clearly patent. There is no  dissection, occlusion or flow-limiting stenosis to the skull base (V1-V3 segments). CTA HEAD FINDINGS POSTERIOR CIRCULATION: --Vertebral arteries: Normal V4 segments. --Inferior cerebellar arteries: Normal. --Basilar artery: Normal. --Superior cerebellar arteries: Normal. --Posterior cerebral arteries (PCA): Normal. ANTERIOR CIRCULATION: --Intracranial internal carotid arteries: Atherosclerotic calcification of the internal carotid arteries at the skull base without hemodynamically significant stenosis. --Anterior cerebral arteries (ACA): There is occlusion of the proximal right A2 segment (series 7, image 100). There is no left A1 segment, which is a common variant. --Middle cerebral arteries (MCA): Normal. VENOUS SINUSES: As permitted by contrast timing, patent. ANATOMIC VARIANTS: None Review of the MIP images confirms the above findings. CT Brain Perfusion Findings: The perfusion component of this scan is markedly degraded by motion and by an abnormal time-enhancement curve. No alive old diagnostic information from this component of the scan. IMPRESSION: 1. Large bilateral pulmonary arterial filling defects, consistent with acute pulmonary emboli. 2. Occlusion of the proximal right A2 segment of the anterior cerebral artery is new since 01/30/2016 and is a  potential source of left-sided weakness. Critical Value/emergent results (item#1) were called by telephone at the time of interpretation on 06/29/2022 at 2:37 am to provider [facility] , who verbally acknowledged these results. Electronically Signed   By: Deatra Robinson M.D.   On: 06/29/2022 02:42   CT ANGIO HEAD NECK W WO CM W PERF (CODE STROKE)  Result Date: 06/29/2022 CLINICAL DATA:  Left-sided weakness EXAM: CT ANGIOGRAPHY HEAD AND NECK CT PERFUSION BRAIN TECHNIQUE: Multidetector CT imaging of the head and neck was performed using the standard protocol during bolus administration of intravenous contrast. Multiplanar CT image reconstructions and MIPs were  obtained to evaluate the vascular anatomy. Carotid stenosis measurements (when applicable) are obtained utilizing NASCET criteria, using the distal internal carotid diameter as the denominator. Multiphase CT imaging of the brain was performed following IV bolus contrast injection. Subsequent parametric perfusion maps were calculated using RAPID software. RADIATION DOSE REDUCTION: This exam was performed according to the departmental dose-optimization program which includes automated exposure control, adjustment of the mA and/or kV according to patient size and/or use of iterative reconstruction technique. CONTRAST:  OMNIPAQUE IOHEXOL 350 MG/ML SOLN COMPARISON:  None Available. FINDINGS: CTA NECK FINDINGS SKELETON: There is no bony spinal canal stenosis. No lytic or blastic lesion. OTHER NECK: Normal pharynx, larynx and major salivary glands. No cervical lymphadenopathy. Unremarkable thyroid gland. UPPER CHEST: There are large bilateral pulmonary arterial filling defects. AORTIC ARCH: There is no calcific atherosclerosis of the aortic arch. There is no aneurysm, dissection or hemodynamically significant stenosis of the visualized portion of the aorta. Conventional 3 vessel aortic branching pattern. The visualized proximal subclavian arteries are widely patent. RIGHT CAROTID SYSTEM: Normal without aneurysm, dissection or stenosis. LEFT CAROTID SYSTEM: No dissection, occlusion or aneurysm. There is mixed density atherosclerosis extending into the proximal ICA, resulting in less than 50% stenosis. VERTEBRAL ARTERIES: Codominant configuration. Both origins are clearly patent. There is no dissection, occlusion or flow-limiting stenosis to the skull base (V1-V3 segments). CTA HEAD FINDINGS POSTERIOR CIRCULATION: --Vertebral arteries: Normal V4 segments. --Inferior cerebellar arteries: Normal. --Basilar artery: Normal. --Superior cerebellar arteries: Normal. --Posterior cerebral arteries (PCA): Normal. ANTERIOR  CIRCULATION: --Intracranial internal carotid arteries: Atherosclerotic calcification of the internal carotid arteries at the skull base without hemodynamically significant stenosis. --Anterior cerebral arteries (ACA): There is occlusion of the proximal right A2 segment (series 7, image 100). There is no left A1 segment, which is a common variant. --Middle cerebral arteries (MCA): Normal. VENOUS SINUSES: As permitted by contrast timing, patent. ANATOMIC VARIANTS: None Review of the MIP images confirms the above findings. CT Brain Perfusion Findings: The perfusion component of this scan is markedly degraded by motion and by an abnormal time-enhancement curve. No alive old diagnostic information from this component of the scan. IMPRESSION: 1. Large bilateral pulmonary arterial filling defects, consistent with acute pulmonary emboli. 2. 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 Critical Value/emergent results (item#1) were called by telephone at the time of interpretation on 06/29/2022 at 2:37 am to provider [facility] , who verbally acknowledged these results. Electronically Signed   By: Deatra Robinson M.D.   On: 06/29/2022 02:42   CT HEAD CODE STROKE WO CONTRAST  Result Date: 06/29/2022 CLINICAL DATA:  Code stroke.  Left-sided weakness EXAM: CT HEAD WITHOUT CONTRAST TECHNIQUE: Contiguous axial images were obtained from the base of the skull through the vertex without intravenous contrast. RADIATION DOSE REDUCTION: This exam  was performed according to the departmental dose-optimization program which includes automated exposure control, adjustment of the mA and/or kV according to patient size and/or use of iterative reconstruction technique. 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 No acute hemorrhage. The size and configuration of the ventricles and extra-axial CSF  spaces are normal. There is hypoattenuation of the periventricular white matter, most commonly indicating chronic ischemic microangiopathy. Vascular: Atherosclerotic calcification of the vertebral and internal carotid arteries at the skull base. No abnormal hyperdensity of the major intracranial arteries or dural venous sinuses. Skull: The visualized skull base, calvarium and extracranial soft tissues are normal. Sinuses/Orbits: No fluid levels or advanced mucosal thickening of the visualized paranasal sinuses. No mastoid or middle ear effusion. The orbits are normal. ASPECTS [facility] Stroke Program Early CT Score) - Ganglionic level infarction (caudate, lentiform nuclei, internal capsule, insula, M1-M3 cortex): 7 - Supraganglionic infarction (M4-M6 cortex): 3 Total score (0-10 with 10 being normal): 10 IMPRESSION: 1. No acute hemorrhage. 2. 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 3. ASPECTS is 10. These results were called by telephone at the time of interpretation on 06/29/2022 at 2:09 am to provider [facility] , who verbally acknowledged these results. Electronically Signed   By: Deatra Robinson M.D.   On: 06/29/2022 02:09     Assessment/Plan Pulmonary emboli: Patient has massive pulmonary emboli with significant right heart strain and hypoxia.  I have recommended thrombectomy and the patient has agreed.  Risk and benefits were reviewed all questions been answered we will move forward with pulmonary thrombectomy either today or tomorrow.  In the meantime patient will continue heparin drip.  He will require anticoagulation for 1 year.  2.  Hypertension: Continue antihypertensive medications as already ordered, these medications have been reviewed and there are no changes at this time.    3.  Diabetes: Continue hypoglycemic medications as already ordered, these medications have been reviewed and there are no changes at this time.  Hgb A1C to be monitored as already arranged by primary service.    4.   Substance abuse: This is now severely impacting the patient's overall health likely this was related to his thrombotic episode and certainly his hypertension.  Should he continue it will certainly portend a dismal outcome.  [name], MD  06/29/2022 11:40 AM

## 2022-06-29 NOTE — IPAL (Signed)
  Interdisciplinary Goals of Care   DISCUSSION  PATIENT HAS DECIDED TO LEAVE AGAINST MEDICAL ADVICE AND ICU CARE. WE HAVE PROVIDED ALL THE OPTIONS AND OPTIMIZED MEDICAL CARE TO THE FULL EXTENT.  PATIENT IS ALERT AND AWAKE, ORIENTED TO PERSON, PLACE, TIME AND THE MEDICAL EVENTS.PATIENT IS NOT HOMICIDAL OR SUICIDAL. PATIENT UNDERSTANDS HIS MEDICAL CONDITIONS-THEY WERE EXPLAINED IN DETAIL AND PATIENT WAS ABLE TO RECITE WORD FOR WORD THE UNDERSTANDING OF MEDICAL CONDITIONS.  PATIENT HAS THE CHOICE OF CONTINUING THE MEDICAL CARE THAT YOU NEED OR TO LEAVE AGAINST MEDICAL ADVICE.  PATIENT UNDERSTANDS THAT THERE IS VERY HIGH RISK AND CHANCES OF DYING-HIGH RISK FOR STROKE, HEART ATTACK, RESPIRATORY FAILURE AND CARDIAC ARREST AND DEATH.     OUTCOME  PATIENT HAS DECIDED TO STAY AND RECEIVE MEDICAL CARE   Padme Arriaga Santiago Glad, M.D.  [facility]  Medical Director [facility] Medical Director [facility] Cardio-Pulmonary Department

## 2022-06-29 NOTE — Progress Notes (Signed)
Pharmacy Antibiotic Note  Maveryk Myung is a 57 y.o. male admitted on 06/29/2022 with sepsis.  Patient found to have blood cultures for staph species. Pharmacy has been consulted for Vancomycin dosing.    Plan: Vancomycin 1750 mg IV Q24H ordered to start on 5/22 @ ~ 0500.  AUC = 497 Vanc trough = 10.2  Height: 5\' 7"  (170.2 cm) Weight: 114.4 kg (252 lb 3.3 oz) IBW/kg (Calculated) : 66.1  Temp (24hrs), Avg:98.8 F (37.1 C), Min:98 F (36.7 C), Max:100.2 F (37.9 C)  Recent Labs  Lab 06/29/22 0243 06/29/22 0446 06/29/22 0707 06/29/22 1129 06/29/22 1142  WBC 7.6 10.0  --   --   --   CREATININE 1.55* 1.40* 1.28* 1.05 1.12  LATICACIDVEN  --  2.9* 2.4*  --   --      Estimated Creatinine Clearance: 89 mL/min (by C-G formula based on SCr of 1.12 mg/dL).    Allergies  Allergen Reactions   Haldol [Haloperidol Lactate]     Antimicrobials this admission: Vancomycin 5/21 >>    Dose adjustments this admission:   Microbiology results: 5/21 BCx: GPC 5/21 MRSA PCR: negative   Thank you for allowing pharmacy to be a part of this patient's care.  Harrell Lark A Rajanae Mantia 06/29/2022 9:20 PM

## 2022-06-29 NOTE — Consult Note (Signed)
       MRN : [id number]  Henry Munoz is a 56 y.o. (04/27/1965) male who presents with chief complaint of legs hurt and swell.  History of Present Illness:   I am asked to see the patient by Dr. Ouma for bilateral pulmonary emboli.  The patient is a 56-year-old gentleman who presented to [facility] emergency room last night for weakness and mental status changes.  He was essentially found down in a parking lot and brought to the emergency room by EMS.  On arrival he had poor mentation associated with left-sided weakness.  He was also noted to be tachypneic and tachycardic with poor oxygen saturations.  CT angiogram of the chest was obtained which demonstrated massive pulmonary emboli.  Patient does not have a history of DVT or PE.  He does have a history of substance abuse.  His talk screen on admission is positive for cocaine.  I have personally reviewed the CT scan and it does show a massive pulmonary emboli bilaterally.  Current Meds  Medication Sig   LANTUS SOLOSTAR 100 UNIT/ML Solostar Pen Inject 32 Units into the skin daily.    Past Medical History:  Diagnosis Date   Diabetes mellitus without complication (HCC)    Hypertension    Schizo-affective schizophrenia (HCC)    TIA (transient ischemic attack)     No past surgical history on file.  Social History Social History   Tobacco Use   Smoking status: Every Day    Packs/day: .5    Types: Cigarettes   Smokeless tobacco: Never  Substance Use Topics   Alcohol use: Yes    Alcohol/week: 2.0 standard drinks of alcohol    Types: 2 Cans of beer per week    Comment: per week   Drug use: Yes    Types: Cocaine    Family History No family history on file.  Allergies  Allergen Reactions   Haldol [Haloperidol Lactate]      REVIEW OF SYSTEMS (Negative unless checked)  Constitutional: []Weight loss  []Fever  []Chills Cardiac: []Chest pain   []Chest pressure   []Palpitations   []Shortness of  breath when laying flat   []Shortness of breath with exertion. Vascular:  []Pain in legs with walking   [x]Pain in legs at rest  []History of DVT   []Phlebitis   [x]Swelling in legs   []Varicose veins   []Non-healing ulcers Pulmonary:   []Uses home oxygen   []Productive cough   []Hemoptysis   []Wheeze  []COPD   []Asthma Neurologic:  []Dizziness   []Seizures   []History of stroke   []History of TIA  []Aphasia   []Vissual changes   []Weakness or numbness in arm   []Weakness or numbness in leg Musculoskeletal:   []Joint swelling   []Joint pain   []Low back pain Hematologic:  []Easy bruising  []Easy bleeding   []Hypercoagulable state   []Anemic Gastrointestinal:  []Diarrhea   []Vomiting  []Gastroesophageal reflux/heartburn   []Difficulty swallowing. Genitourinary:  []Chronic kidney disease   []Difficult urination  []Frequent urination   []Blood in urine Skin:  []Rashes   []Ulcers  Psychological:  []History of anxiety   [] History of major depression.  Physical Examination  Vitals:   06/29/22 0700 06/29/22 0800 06/29/22 0900 06/29/22 1000  BP: 120/85 (!) 109/94 (!) 124/93 114/86  Pulse: 87 87 87 91  Resp: 20 10 (!) 23 (!) 0  Temp:  98.6 F (37 C)    TempSrc:  Oral      SpO2: 95% 96% 92% 97%  Weight:      Height:       Body mass index is 39.5 kg/m. Gen: WD/WN, NAD Head: [place]/AT, No temporalis wasting.  Ear/Nose/Throat: Hearing grossly intact, nares w/o erythema or drainage, pinna without lesions Eyes: PER, EOMI, sclera nonicteric.  Neck: Supple, no gross masses.  No JVD.  Pulmonary:  Good air movement, no audible wheezing, no use of accessory muscles.  Cardiac: RRR, precordium not hyperdynamic. Vascular:   2+ soft pitting edema. CEAP [id number]   Vessel Right Left  Radial Palpable Palpable  Gastrointestinal: soft, non-distended. No guarding/no peritoneal signs.  Musculoskeletal: M/S 5/5 throughout.  No deformity.  Neurologic: CN 2-12 intact. Pain and light touch intact in extremities.   Symmetrical.  Speech is fluent. Motor exam as listed above. Psychiatric: Judgment intact, Mood & affect appropriate for pt's clinical situation. Dermatologic: Venous rashes no ulcers noted.  No changes consistent with cellulitis. Lymph : No lichenification or skin changes of chronic lymphedema.  CBC Lab Results  Component Value Date   WBC 10.0 06/29/2022   HGB 13.2 06/29/2022   HCT 41.7 06/29/2022   MCV 97.2 06/29/2022   PLT 168 06/29/2022    BMET    Component Value Date/Time   NA 135 06/29/2022 0707   NA 145 (H) 10/20/2017 1921   K 3.4 (L) 06/29/2022 0707   CL 103 06/29/2022 0707   CO2 21 (L) 06/29/2022 0707   GLUCOSE 245 (H) 06/29/2022 0707   BUN 17 06/29/2022 0707   BUN 13 10/20/2017 1921   CREATININE 1.28 (H) 06/29/2022 0707   CALCIUM 8.9 06/29/2022 0707   GFRNONAA >60 06/29/2022 0707   GFRAA 114 10/20/2017 1921   Estimated Creatinine Clearance: 77.8 mL/min (A) (by C-G formula based on SCr of 1.28 mg/dL (H)).  COAG Lab Results  Component Value Date   INR 1.3 (H) 06/29/2022   INR 1.02 01/30/2016    Radiology DG Abd 1 View  Result Date: 06/29/2022 CLINICAL DATA:  MRI clearance. EXAM: ABDOMEN - 1 VIEW COMPARISON:  None Available. FINDINGS: Mild gaseous distention of the stomach. No gaseous dilatation of small bowel or colon. Visualized bony anatomy unremarkable. Foley catheter overlies the low pelvis. No unexpected radiopaque/metallic foreign body identified over the visualized abdomen or pelvis. IMPRESSION: 1. No unexpected radiopaque/metallic foreign body. 2. Mild gaseous distention of the stomach. Electronically Signed   By: Eric  Mansell M.D.   On: 06/29/2022 05:15   DG Chest Port 1 View  Result Date: 06/29/2022 CLINICAL DATA:  MRI clearance. EXAM: PORTABLE CHEST 1 VIEW COMPARISON:  10/09/2017 FINDINGS: The lungs are clear without focal pneumonia, edema, pneumothorax or pleural effusion. Cardiopericardial silhouette is at upper limits of normal for size. No acute  bony abnormality. Telemetry leads overlie the chest. No evidence for unexpected radiopaque foreign body overlying the visualized thorax. IMPRESSION: 1. No acute cardiopulmonary findings. 2. No evidence for unexpected radiopaque/metallic foreign body. Electronically Signed   By: Eric  Mansell M.D.   On: 06/29/2022 05:13   CT Angio Chest PE W and/or Wo Contrast  Result Date: 06/29/2022 CLINICAL DATA:  Diaphoresis.  Found in parking lot EXAM: CT ANGIOGRAPHY CHEST WITH CONTRAST TECHNIQUE: Multidetector CT imaging of the chest was performed using the standard protocol during bolus administration of intravenous contrast. Multiplanar CT image reconstructions and MIPs were obtained to evaluate the vascular anatomy. RADIATION DOSE REDUCTION: This exam was performed according to the departmental dose-optimization program which includes automated exposure control, adjustment of the mA and/or kV   according to patient size and/or use of iterative reconstruction technique. CONTRAST:  100mL OMNIPAQUE IOHEXOL 350 MG/ML SOLN COMPARISON:  None Available. FINDINGS: Cardiovascular: Satisfactory opacification of the pulmonary arteries to the segmental level. Large volume of acute appearing clot in the bilateral main pulmonary arteries extending into all lobes with multiple occlusive segmental branches. Right heart strain which may be accentuated by left ventricular hypertrophy, RV to LV ratio measures 2.2. Atheromatous plaque along the coronaries. Mediastinum/Nodes: No mass or adenopathy Lungs/Pleura: No failure or infarct seen. Upper Abdomen: Contrast in the kidneys from recent CTA of the head and neck Musculoskeletal: No acute finding. Review of the MIP images confirms the above findings. IMPRESSION: 1. Large volume of acute pulmonary emboli affecting all lobar branches with right heart strain. 2. Coronary atherosclerosis. Electronically Signed   By: Jonathan  Watts M.D.   On: 06/29/2022 04:09   CT CEREBRAL PERFUSION W  CONTRAST  Result Date: 06/29/2022 CLINICAL DATA:  Left-sided weakness EXAM: CT ANGIOGRAPHY HEAD AND NECK CT PERFUSION BRAIN TECHNIQUE: Multidetector CT imaging of the head and neck was performed using the standard protocol during bolus administration of intravenous contrast. Multiplanar CT image reconstructions and MIPs were obtained to evaluate the vascular anatomy. Carotid stenosis measurements (when applicable) are obtained utilizing NASCET criteria, using the distal internal carotid diameter as the denominator. Multiphase CT imaging of the brain was performed following IV bolus contrast injection. Subsequent parametric perfusion maps were calculated using RAPID software. RADIATION DOSE REDUCTION: This exam was performed according to the departmental dose-optimization program which includes automated exposure control, adjustment of the mA and/or kV according to patient size and/or use of iterative reconstruction technique. CONTRAST:  125mL OMNIPAQUE IOHEXOL 350 MG/ML SOLN COMPARISON:  None Available. FINDINGS: CTA NECK FINDINGS SKELETON: There is no bony spinal canal stenosis. No lytic or blastic lesion. OTHER NECK: Normal pharynx, larynx and major salivary glands. No cervical lymphadenopathy. Unremarkable thyroid gland. UPPER CHEST: There are large bilateral pulmonary arterial filling defects. AORTIC ARCH: There is no calcific atherosclerosis of the aortic arch. There is no aneurysm, dissection or hemodynamically significant stenosis of the visualized portion of the aorta. Conventional 3 vessel aortic branching pattern. The visualized proximal subclavian arteries are widely patent. RIGHT CAROTID SYSTEM: Normal without aneurysm, dissection or stenosis. LEFT CAROTID SYSTEM: No dissection, occlusion or aneurysm. There is mixed density atherosclerosis extending into the proximal ICA, resulting in less than 50% stenosis. VERTEBRAL ARTERIES: Codominant configuration. Both origins are clearly patent. There is no  dissection, occlusion or flow-limiting stenosis to the skull base (V1-V3 segments). CTA HEAD FINDINGS POSTERIOR CIRCULATION: --Vertebral arteries: Normal V4 segments. --Inferior cerebellar arteries: Normal. --Basilar artery: Normal. --Superior cerebellar arteries: Normal. --Posterior cerebral arteries (PCA): Normal. ANTERIOR CIRCULATION: --Intracranial internal carotid arteries: Atherosclerotic calcification of the internal carotid arteries at the skull base without hemodynamically significant stenosis. --Anterior cerebral arteries (ACA): There is occlusion of the proximal right A2 segment (series 7, image 100). There is no left A1 segment, which is a common variant. --Middle cerebral arteries (MCA): Normal. VENOUS SINUSES: As permitted by contrast timing, patent. ANATOMIC VARIANTS: None Review of the MIP images confirms the above findings. CT Brain Perfusion Findings: The perfusion component of this scan is markedly degraded by motion and by an abnormal time-enhancement curve. No alive old diagnostic information from this component of the scan. IMPRESSION: 1. Large bilateral pulmonary arterial filling defects, consistent with acute pulmonary emboli. 2. Occlusion of the proximal right A2 segment of the anterior cerebral artery is new since 01/30/2016 and is a   potential source of left-sided weakness. Critical Value/emergent results (item#1) were called by telephone at the time of interpretation on 06/29/2022 at 2:37 am to provider DYLAN SMITH , who verbally acknowledged these results. Electronically Signed   By: Kevin  Herman M.D.   On: 06/29/2022 02:42   CT ANGIO HEAD NECK W WO CM W PERF (CODE STROKE)  Result Date: 06/29/2022 CLINICAL DATA:  Left-sided weakness EXAM: CT ANGIOGRAPHY HEAD AND NECK CT PERFUSION BRAIN TECHNIQUE: Multidetector CT imaging of the head and neck was performed using the standard protocol during bolus administration of intravenous contrast. Multiplanar CT image reconstructions and MIPs were  obtained to evaluate the vascular anatomy. Carotid stenosis measurements (when applicable) are obtained utilizing NASCET criteria, using the distal internal carotid diameter as the denominator. Multiphase CT imaging of the brain was performed following IV bolus contrast injection. Subsequent parametric perfusion maps were calculated using RAPID software. RADIATION DOSE REDUCTION: This exam was performed according to the departmental dose-optimization program which includes automated exposure control, adjustment of the mA and/or kV according to patient size and/or use of iterative reconstruction technique. CONTRAST:  125mL OMNIPAQUE IOHEXOL 350 MG/ML SOLN COMPARISON:  None Available. FINDINGS: CTA NECK FINDINGS SKELETON: There is no bony spinal canal stenosis. No lytic or blastic lesion. OTHER NECK: Normal pharynx, larynx and major salivary glands. No cervical lymphadenopathy. Unremarkable thyroid gland. UPPER CHEST: There are large bilateral pulmonary arterial filling defects. AORTIC ARCH: There is no calcific atherosclerosis of the aortic arch. There is no aneurysm, dissection or hemodynamically significant stenosis of the visualized portion of the aorta. Conventional 3 vessel aortic branching pattern. The visualized proximal subclavian arteries are widely patent. RIGHT CAROTID SYSTEM: Normal without aneurysm, dissection or stenosis. LEFT CAROTID SYSTEM: No dissection, occlusion or aneurysm. There is mixed density atherosclerosis extending into the proximal ICA, resulting in less than 50% stenosis. VERTEBRAL ARTERIES: Codominant configuration. Both origins are clearly patent. There is no dissection, occlusion or flow-limiting stenosis to the skull base (V1-V3 segments). CTA HEAD FINDINGS POSTERIOR CIRCULATION: --Vertebral arteries: Normal V4 segments. --Inferior cerebellar arteries: Normal. --Basilar artery: Normal. --Superior cerebellar arteries: Normal. --Posterior cerebral arteries (PCA): Normal. ANTERIOR  CIRCULATION: --Intracranial internal carotid arteries: Atherosclerotic calcification of the internal carotid arteries at the skull base without hemodynamically significant stenosis. --Anterior cerebral arteries (ACA): There is occlusion of the proximal right A2 segment (series 7, image 100). There is no left A1 segment, which is a common variant. --Middle cerebral arteries (MCA): Normal. VENOUS SINUSES: As permitted by contrast timing, patent. ANATOMIC VARIANTS: None Review of the MIP images confirms the above findings. CT Brain Perfusion Findings: The perfusion component of this scan is markedly degraded by motion and by an abnormal time-enhancement curve. No alive old diagnostic information from this component of the scan. IMPRESSION: 1. Large bilateral pulmonary arterial filling defects, consistent with acute pulmonary emboli. 2. 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 Critical Value/emergent results (item#1) were called by telephone at the time of interpretation on 06/29/2022 at 2:37 am to provider DYLAN SMITH , who verbally acknowledged these results. Electronically Signed   By: Kevin  Herman M.D.   On: 06/29/2022 02:42   CT HEAD CODE STROKE WO CONTRAST  Result Date: 06/29/2022 CLINICAL DATA:  Code stroke.  Left-sided weakness EXAM: CT HEAD WITHOUT CONTRAST TECHNIQUE: Contiguous axial images were obtained from the base of the skull through the vertex without intravenous contrast. RADIATION DOSE REDUCTION: This exam   was performed according to the departmental dose-optimization program which includes automated exposure control, adjustment of the mA and/or kV according to patient size and/or use of iterative reconstruction technique. 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 No acute hemorrhage. The size and configuration of the ventricles and extra-axial CSF  spaces are normal. There is hypoattenuation of the periventricular white matter, most commonly indicating chronic ischemic microangiopathy. Vascular: Atherosclerotic calcification of the vertebral and internal carotid arteries at the skull base. No abnormal hyperdensity of the major intracranial arteries or dural venous sinuses. Skull: The visualized skull base, calvarium and extracranial soft tissues are normal. Sinuses/Orbits: No fluid levels or advanced mucosal thickening of the visualized paranasal sinuses. No mastoid or middle ear effusion. The orbits are normal. ASPECTS (Alberta Stroke Program Early CT Score) - Ganglionic level infarction (caudate, lentiform nuclei, internal capsule, insula, M1-M3 cortex): 7 - Supraganglionic infarction (M4-M6 cortex): 3 Total score (0-10 with 10 being normal): 10 IMPRESSION: 1. No acute hemorrhage. 2. 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 3. ASPECTS is 10. These results were called by telephone at the time of interpretation on 06/29/2022 at 2:09 am to provider DYLAN SMITH , who verbally acknowledged these results. Electronically Signed   By: Kevin  Herman M.D.   On: 06/29/2022 02:09     Assessment/Plan Pulmonary emboli: Patient has massive pulmonary emboli with significant right heart strain and hypoxia.  I have recommended thrombectomy and the patient has agreed.  Risk and benefits were reviewed all questions been answered we will move forward with pulmonary thrombectomy either today or tomorrow.  In the meantime patient will continue heparin drip.  He will require anticoagulation for 1 year.  2.  Hypertension: Continue antihypertensive medications as already ordered, these medications have been reviewed and there are no changes at this time.    3.  Diabetes: Continue hypoglycemic medications as already ordered, these medications have been reviewed and there are no changes at this time.  Hgb A1C to be monitored as already arranged by primary service.    4.   Substance abuse: This is now severely impacting the patient's overall health likely this was related to his thrombotic episode and certainly his hypertension.  Should he continue it will certainly portend a dismal outcome.  Jazyiah Yiu, MD  06/29/2022 11:40 AM   

## 2022-06-29 NOTE — Progress Notes (Signed)
PHARMACY - PHYSICIAN COMMUNICATION CRITICAL VALUE ALERT - BLOOD CULTURE IDENTIFICATION (BCID)  Henry Munoz is an 57 y.o. male who presented to [facility] on 06/29/2022 with a chief complaint of left-sided weakness.  Assessment:  2/4 bottles(both aerobic) GPC, staph species  Name of physician (or Provider) Contacted: Webb Silversmith  Current antibiotics: none  Changes to prescribed antibiotics recommended:  Recommendations accepted by provider Will start patient on Vancomycin IV and await sensitivities/susceptibilities   Results for orders placed or performed during the hospital encounter of 06/29/22  Blood Culture ID Panel (Reflexed) (Collected: 06/29/2022  2:44 AM)  Result Value Ref Range   Enterococcus faecalis NOT DETECTED NOT DETECTED   Enterococcus Faecium NOT DETECTED NOT DETECTED   Listeria monocytogenes NOT DETECTED NOT DETECTED   Staphylococcus species DETECTED (A) NOT DETECTED   Staphylococcus aureus (BCID) NOT DETECTED NOT DETECTED   Staphylococcus epidermidis NOT DETECTED NOT DETECTED   Staphylococcus lugdunensis NOT DETECTED NOT DETECTED   Streptococcus species NOT DETECTED NOT DETECTED   Streptococcus agalactiae NOT DETECTED NOT DETECTED   Streptococcus pneumoniae NOT DETECTED NOT DETECTED   Streptococcus pyogenes NOT DETECTED NOT DETECTED   A.calcoaceticus-baumannii NOT DETECTED NOT DETECTED   Bacteroides fragilis NOT DETECTED NOT DETECTED   Enterobacterales NOT DETECTED NOT DETECTED   Enterobacter cloacae complex NOT DETECTED NOT DETECTED   Escherichia coli NOT DETECTED NOT DETECTED   Klebsiella aerogenes NOT DETECTED NOT DETECTED   Klebsiella oxytoca NOT DETECTED NOT DETECTED   Klebsiella pneumoniae NOT DETECTED NOT DETECTED   Proteus species NOT DETECTED NOT DETECTED   Salmonella species NOT DETECTED NOT DETECTED   Serratia marcescens NOT DETECTED NOT DETECTED   Haemophilus influenzae NOT DETECTED NOT DETECTED   Neisseria meningitidis NOT DETECTED NOT  DETECTED   Pseudomonas aeruginosa NOT DETECTED NOT DETECTED   Stenotrophomonas maltophilia NOT DETECTED NOT DETECTED   Candida albicans NOT DETECTED NOT DETECTED   Candida auris NOT DETECTED NOT DETECTED   Candida glabrata NOT DETECTED NOT DETECTED   Candida krusei NOT DETECTED NOT DETECTED   Candida parapsilosis NOT DETECTED NOT DETECTED   Candida tropicalis NOT DETECTED NOT DETECTED   Cryptococcus neoformans/gattii NOT DETECTED NOT DETECTED    Bettey Costa 06/29/2022  9:47 PM

## 2022-06-29 NOTE — ED Notes (Addendum)
MD Katrinka Blazing at bedside - notified of troponin. Teleneuro on at bedside - neurologist Patricia Nettle) speaking to Dr Katrinka Blazing

## 2022-06-29 NOTE — ED Provider Notes (Signed)
[facility] Provider Note    Event Date/Time   First MD Initiated Contact with Patient 06/29/22 0147     (approximate)   History   Weakness   HPI  Henry Munoz is a 57 y.o. male who presents to the ED for evaluation of Weakness   I review a medical DC summary from 2019 where patient was seen for altered mentation.  History of schizophrenia, bipolar disorder, diabetes, HTN and TIA.  Suspect polypharmacy and improved with fluid resuscitation and time.  Patient presents to the ED for evaluation of left-sided weakness.  History is quite limited on arrival.  He arrives by EMS from the parking lot of an apartment complex.  EMS reports that he was visiting a friend and his friend provides all history to them.  They report that patient drove himself to this parking lot and friends report that he does not have any significant residual left-sided weakness from an old stroke.  They report that patient was seemingly "normal" but collapsed in the parking lot of his apartment building and noted new or worsening left-sided weakness which is why EMS was called.  Uncertain last known normal.  Patient is disoriented on arrival and unable to provide much relevant history.   Physical Exam   Triage Vital Signs: ED Triage Vitals  Enc Vitals Group     BP      Pulse      Resp      Temp      Temp src      SpO2      Weight      Height      Head Circumference      Peak Flow      Pain Score      Pain Loc      Pain Edu?      Excl. in GC?     Most recent vital signs: Vitals:   06/29/22 0630 06/29/22 0645  BP: 103/74 95/73  Pulse: 95 94  Resp: (!) 24 (!) 24  Temp:    SpO2: 93% 98%    General: Somnolent, disoriented, maintaining his airway appropriately CV:  Good peripheral perfusion.  Tachycardic and regular Resp:  Tachypnea with clear lungs Abd:  No distention.  Soft MSK:  No deformity noted.  Neuro:  Left-sided weakness is present. Other:     ED  Results / Procedures / Treatments   Labs (all labs ordered are listed, but only abnormal results are displayed) Labs Reviewed  PROTIME-INR - Abnormal; Notable for the following components:      Result Value   Prothrombin Time 16.2 (*)    INR 1.3 (*)    All other components within normal limits  COMPREHENSIVE METABOLIC PANEL - Abnormal; Notable for the following components:   Sodium 132 (*)    Chloride 97 (*)    CO2 18 (*)    Glucose, Bld 444 (*)    Creatinine, Ser 1.55 (*)    AST 73 (*)    ALT 64 (*)    Total Bilirubin 1.3 (*)    GFR, Estimated 52 (*)    Anion gap 17 (*)    All other components within normal limits  URINE DRUG SCREEN, QUALITATIVE ([facility] ONLY) - Abnormal; Notable for the following components:   Cocaine Metabolite,Ur [place] POSITIVE (*)    All other components within normal limits  URINALYSIS, ROUTINE W REFLEX MICROSCOPIC - Abnormal; Notable for the following components:   Color, Urine YELLOW (*)  APPearance HAZY (*)    Specific Gravity, Urine 1.039 (*)    Glucose, UA >=500 (*)    Ketones, ur 20 (*)    Protein, ur 100 (*)    Bacteria, UA RARE (*)    All other components within normal limits  BLOOD GAS, VENOUS - Abnormal; Notable for the following components:   pCO2, Ven 43 (*)    pO2, Ven 46 (*)    Acid-base deficit 5.7 (*)    All other components within normal limits  BETA-HYDROXYBUTYRIC ACID - Abnormal; Notable for the following components:   Beta-Hydroxybutyric Acid 3.09 (*)    All other components within normal limits  LACTIC ACID, PLASMA - Abnormal; Notable for the following components:   Lactic Acid, Venous 2.9 (*)    All other components within normal limits  BRAIN NATRIURETIC PEPTIDE - Abnormal; Notable for the following components:   B Natriuretic Peptide 205.8 (*)    All other components within normal limits  BASIC METABOLIC PANEL - Abnormal; Notable for the following components:   Sodium 130 (*)    Potassium 6.8 (*)    CO2 18 (*)    Glucose,  Bld 381 (*)    Creatinine, Ser 1.40 (*)    Calcium 8.5 (*)    GFR, Estimated 59 (*)    All other components within normal limits  BETA-HYDROXYBUTYRIC ACID - Abnormal; Notable for the following components:   Beta-Hydroxybutyric Acid 3.65 (*)    All other components within normal limits  CBG MONITORING, ED - Abnormal; Notable for the following components:   Glucose-Capillary 417 (*)    All other components within normal limits  CBG MONITORING, ED - Abnormal; Notable for the following components:   Glucose-Capillary 441 (*)    All other components within normal limits  TROPONIN I (HIGH SENSITIVITY) - Abnormal; Notable for the following components:   Troponin I (High Sensitivity) 362 (*)    All other components within normal limits  TROPONIN I (HIGH SENSITIVITY) - Abnormal; Notable for the following components:   Troponin I (High Sensitivity) 466 (*)    All other components within normal limits  CULTURE, BLOOD (ROUTINE X 2)  CULTURE, BLOOD (ROUTINE X 2)  MRSA NEXT GEN BY PCR, NASAL  ETHANOL  APTT  CBC  DIFFERENTIAL  PROCALCITONIN  LIPASE, BLOOD  PHOSPHORUS  CBC  MAGNESIUM  HEPARIN LEVEL (UNFRACTIONATED)  LACTIC ACID, PLASMA  STREP PNEUMONIAE URINARY ANTIGEN  LEGIONELLA PNEUMOPHILA SEROGP 1 UR AG  BASIC METABOLIC PANEL  BASIC METABOLIC PANEL  BASIC METABOLIC PANEL  BASIC METABOLIC PANEL  BETA-HYDROXYBUTYRIC ACID  BETA-HYDROXYBUTYRIC ACID  HEMOGLOBIN A1C  HIV ANTIBODY (ROUTINE TESTING W REFLEX)  LIPID PANEL  TSH    EKG Sinus tachycardia with rate of 114 bpm.  Incomplete right bundle.  No STEMI.  RADIOLOGY CT head interpreted by me without evidence of acute intracranial pathology CT angiogram neck/head with bilateral proximal PE  Official radiology report(s): DG Abd 1 View  Result Date: 06/29/2022 CLINICAL DATA:  MRI clearance. EXAM: ABDOMEN - 1 VIEW COMPARISON:  None Available. FINDINGS: Mild gaseous distention of the stomach. No gaseous dilatation of small bowel  or colon. Visualized bony anatomy unremarkable. Foley catheter overlies the low pelvis. No unexpected radiopaque/metallic foreign body identified over the visualized abdomen or pelvis. IMPRESSION: 1. No unexpected radiopaque/metallic foreign body. 2. Mild gaseous distention of the stomach. Electronically Signed   By: Kennith Center M.D.   On: 06/29/2022 05:15   DG Chest Port 1 View  Result Date:  06/29/2022 CLINICAL DATA:  MRI clearance. EXAM: PORTABLE CHEST 1 VIEW COMPARISON:  10/09/2017 FINDINGS: The lungs are clear without focal pneumonia, edema, pneumothorax or pleural effusion. Cardiopericardial silhouette is at upper limits of normal for size. No acute bony abnormality. Telemetry leads overlie the chest. No evidence for unexpected radiopaque foreign body overlying the visualized thorax. IMPRESSION: 1. No acute cardiopulmonary findings. 2. No evidence for unexpected radiopaque/metallic foreign body. Electronically Signed   By: Kennith Center M.D.   On: 06/29/2022 05:13   CT Angio Chest PE W and/or Wo Contrast  Result Date: 06/29/2022 CLINICAL DATA:  Diaphoresis.  Found in parking lot EXAM: CT ANGIOGRAPHY CHEST WITH CONTRAST TECHNIQUE: Multidetector CT imaging of the chest was performed using the standard protocol during bolus administration of intravenous contrast. Multiplanar CT image reconstructions and MIPs were obtained to evaluate the vascular anatomy. RADIATION DOSE REDUCTION: This exam was performed according to the departmental dose-optimization program which includes automated exposure control, adjustment of the mA and/or kV according to patient size and/or use of iterative reconstruction technique. CONTRAST:  OMNIPAQUE IOHEXOL 350 MG/ML SOLN COMPARISON:  None Available. FINDINGS: Cardiovascular: Satisfactory opacification of the pulmonary arteries to the segmental level. Large volume of acute appearing clot in the bilateral main pulmonary arteries extending into all lobes with multiple  occlusive segmental branches. Right heart strain which may be accentuated by left ventricular hypertrophy, RV to LV ratio measures 2.2. Atheromatous plaque along the coronaries. Mediastinum/Nodes: No mass or adenopathy Lungs/Pleura: No failure or infarct seen. Upper Abdomen: Contrast in the kidneys from recent CTA of the head and neck Musculoskeletal: No acute finding. Review of the MIP images confirms the above findings. IMPRESSION: 1. Large volume of acute pulmonary emboli affecting all lobar branches with right heart strain. 2. Coronary atherosclerosis. Electronically Signed   By: Tiburcio Pea M.D.   On: 06/29/2022 04:09   CT CEREBRAL PERFUSION W CONTRAST  Result Date: 06/29/2022 CLINICAL DATA:  Left-sided weakness EXAM: CT ANGIOGRAPHY HEAD AND NECK CT PERFUSION BRAIN TECHNIQUE: Multidetector CT imaging of the head and neck was performed using the standard protocol during bolus administration of intravenous contrast. Multiplanar CT image reconstructions and MIPs were obtained to evaluate the vascular anatomy. Carotid stenosis measurements (when applicable) are obtained utilizing NASCET criteria, using the distal internal carotid diameter as the denominator. Multiphase CT imaging of the brain was performed following IV bolus contrast injection. Subsequent parametric perfusion maps were calculated using RAPID software. RADIATION DOSE REDUCTION: This exam was performed according to the departmental dose-optimization program which includes automated exposure control, adjustment of the mA and/or kV according to patient size and/or use of iterative reconstruction technique. CONTRAST:  OMNIPAQUE IOHEXOL 350 MG/ML SOLN COMPARISON:  None Available. FINDINGS: CTA NECK FINDINGS SKELETON: There is no bony spinal canal stenosis. No lytic or blastic lesion. OTHER NECK: Normal pharynx, larynx and major salivary glands. No cervical lymphadenopathy. Unremarkable thyroid gland. UPPER CHEST: There are large bilateral  pulmonary arterial filling defects. AORTIC ARCH: There is no calcific atherosclerosis of the aortic arch. There is no aneurysm, dissection or hemodynamically significant stenosis of the visualized portion of the aorta. Conventional 3 vessel aortic branching pattern. The visualized proximal subclavian arteries are widely patent. RIGHT CAROTID SYSTEM: Normal without aneurysm, dissection or stenosis. LEFT CAROTID SYSTEM: No dissection, occlusion or aneurysm. There is mixed density atherosclerosis extending into the proximal ICA, resulting in less than 50% stenosis. VERTEBRAL ARTERIES: Codominant configuration. Both origins are clearly patent. There is no dissection, occlusion or flow-limiting stenosis to  the skull base (V1-V3 segments). CTA HEAD FINDINGS POSTERIOR CIRCULATION: --Vertebral arteries: Normal V4 segments. --Inferior cerebellar arteries: Normal. --Basilar artery: Normal. --Superior cerebellar arteries: Normal. --Posterior cerebral arteries (PCA): Normal. ANTERIOR CIRCULATION: --Intracranial internal carotid arteries: Atherosclerotic calcification of the internal carotid arteries at the skull base without hemodynamically significant stenosis. --Anterior cerebral arteries (ACA): There is occlusion of the proximal right A2 segment (series 7, image 100). There is no left A1 segment, which is a common variant. --Middle cerebral arteries (MCA): Normal. VENOUS SINUSES: As permitted by contrast timing, patent. ANATOMIC VARIANTS: None Review of the MIP images confirms the above findings. CT Brain Perfusion Findings: The perfusion component of this scan is markedly degraded by motion and by an abnormal time-enhancement curve. No alive old diagnostic information from this component of the scan. IMPRESSION: 1. Large bilateral pulmonary arterial filling defects, consistent with acute pulmonary emboli. 2. Occlusion of the proximal right A2 segment of the anterior cerebral artery is new since 01/30/2016 and is a potential  source of left-sided weakness. Critical Value/emergent results (item#1) were called by telephone at the time of interpretation on 06/29/2022 at 2:37 am to provider [facility] , who verbally acknowledged these results. Electronically Signed   By: Deatra Robinson M.D.   On: 06/29/2022 02:42   CT ANGIO HEAD NECK W WO CM W PERF (CODE STROKE)  Result Date: 06/29/2022 CLINICAL DATA:  Left-sided weakness EXAM: CT ANGIOGRAPHY HEAD AND NECK CT PERFUSION BRAIN TECHNIQUE: Multidetector CT imaging of the head and neck was performed using the standard protocol during bolus administration of intravenous contrast. Multiplanar CT image reconstructions and MIPs were obtained to evaluate the vascular anatomy. Carotid stenosis measurements (when applicable) are obtained utilizing NASCET criteria, using the distal internal carotid diameter as the denominator. Multiphase CT imaging of the brain was performed following IV bolus contrast injection. Subsequent parametric perfusion maps were calculated using RAPID software. RADIATION DOSE REDUCTION: This exam was performed according to the departmental dose-optimization program which includes automated exposure control, adjustment of the mA and/or kV according to patient size and/or use of iterative reconstruction technique. CONTRAST:  OMNIPAQUE IOHEXOL 350 MG/ML SOLN COMPARISON:  None Available. FINDINGS: CTA NECK FINDINGS SKELETON: There is no bony spinal canal stenosis. No lytic or blastic lesion. OTHER NECK: Normal pharynx, larynx and major salivary glands. No cervical lymphadenopathy. Unremarkable thyroid gland. UPPER CHEST: There are large bilateral pulmonary arterial filling defects. AORTIC ARCH: There is no calcific atherosclerosis of the aortic arch. There is no aneurysm, dissection or hemodynamically significant stenosis of the visualized portion of the aorta. Conventional 3 vessel aortic branching pattern. The visualized proximal subclavian arteries are widely patent.  RIGHT CAROTID SYSTEM: Normal without aneurysm, dissection or stenosis. LEFT CAROTID SYSTEM: No dissection, occlusion or aneurysm. There is mixed density atherosclerosis extending into the proximal ICA, resulting in less than 50% stenosis. VERTEBRAL ARTERIES: Codominant configuration. Both origins are clearly patent. There is no dissection, occlusion or flow-limiting stenosis to the skull base (V1-V3 segments). CTA HEAD FINDINGS POSTERIOR CIRCULATION: --Vertebral arteries: Normal V4 segments. --Inferior cerebellar arteries: Normal. --Basilar artery: Normal. --Superior cerebellar arteries: Normal. --Posterior cerebral arteries (PCA): Normal. ANTERIOR CIRCULATION: --Intracranial internal carotid arteries: Atherosclerotic calcification of the internal carotid arteries at the skull base without hemodynamically significant stenosis. --Anterior cerebral arteries (ACA): There is occlusion of the proximal right A2 segment (series 7, image 100). There is no left A1 segment, which is a common variant. --Middle cerebral arteries (MCA): Normal. VENOUS SINUSES: As permitted by contrast timing, patent. ANATOMIC VARIANTS:  None Review of the MIP images confirms the above findings. CT Brain Perfusion Findings: The perfusion component of this scan is markedly degraded by motion and by an abnormal time-enhancement curve. No alive old diagnostic information from this component of the scan. IMPRESSION: 1. Large bilateral pulmonary arterial filling defects, consistent with acute pulmonary emboli. 2. 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 Critical Value/emergent results (item#1) were called by telephone at the time of interpretation on 06/29/2022 at 2:37 am to provider [facility] , who verbally acknowledged these results. Electronically Signed   By: Deatra Robinson M.D.   On: 06/29/2022 02:42   CT HEAD CODE STROKE WO  CONTRAST  Result Date: 06/29/2022 CLINICAL DATA:  Code stroke.  Left-sided weakness EXAM: CT HEAD WITHOUT CONTRAST TECHNIQUE: Contiguous axial images were obtained from the base of the skull through the vertex without intravenous contrast. RADIATION DOSE REDUCTION: This exam was performed according to the departmental dose-optimization program which includes automated exposure control, adjustment of the mA and/or kV according to patient size and/or use of iterative reconstruction technique. 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 No acute hemorrhage. The size and configuration of the ventricles and extra-axial CSF spaces are normal. There is hypoattenuation of the periventricular white matter, most commonly indicating chronic ischemic microangiopathy. Vascular: Atherosclerotic calcification of the vertebral and internal carotid arteries at the skull base. No abnormal hyperdensity of the major intracranial arteries or dural venous sinuses. Skull: The visualized skull base, calvarium and extracranial soft tissues are normal. Sinuses/Orbits: No fluid levels or advanced mucosal thickening of the visualized paranasal sinuses. No mastoid or middle ear effusion. The orbits are normal. ASPECTS [facility] Stroke Program Early CT Score) - Ganglionic level infarction (caudate, lentiform nuclei, internal capsule, insula, M1-M3 cortex): 7 - Supraganglionic infarction (M4-M6 cortex): 3 Total score (0-10 with 10 being normal): 10 IMPRESSION: 1. No acute hemorrhage. 2. 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 3. ASPECTS is 10. These results were called by telephone at the time of interpretation on 06/29/2022 at 2:09 am to provider [facility] , who verbally acknowledged these results. Electronically Signed   By: Deatra Robinson M.D.   On: 06/29/2022 02:09    PROCEDURES and INTERVENTIONS:  .1-3 Lead EKG Interpretation  Performed by: Delton Prairie, MD Authorized by: Delton Prairie, MD      Interpretation: abnormal     ECG rate:  120   ECG rate assessment: tachycardic     Rhythm: sinus tachycardia     Ectopy: none     Conduction: normal   .Critical Care  Performed by: Delton Prairie, MD Authorized by: Delton Prairie, MD   Critical care provider statement:    Critical care time (minutes):  30   Critical care time was exclusive of:  Separately billable procedures and treating other patients   Critical care was necessary to treat or prevent imminent or life-threatening deterioration of the following conditions:  CNS failure or compromise, respiratory failure, circulatory failure and cardiac failure   Critical care was time spent personally by me on the following activities:  Development of treatment plan with patient or surrogate, discussions with consultants, evaluation of patient's response to treatment, examination of patient, ordering and review of laboratory studies, ordering and review of radiographic studies, ordering and performing treatments and interventions, pulse oximetry, re-evaluation of patient's condition and review of old charts   Medications  heparin ADULT  infusion 100 units/mL (25000 units/231mL) (1,500 Units/hr Intravenous Infusion Verify 06/29/22 0600)  docusate sodium (COLACE) capsule 100 mg (has no administration in time range)  polyethylene glycol (MIRALAX / GLYCOLAX) packet 17 g (has no administration in time range)  insulin regular, human (MYXREDLIN) 100 units/ 100 mL infusion (9.5 Units/hr Intravenous Infusion Verify 06/29/22 0600)  lactated ringers infusion ( Intravenous Infusion Verify 06/29/22 0600)  dextrose 5 % in lactated ringers infusion (0 mLs Intravenous Hold 06/29/22 0421)  dextrose 50 % solution 0-50 mL (has no administration in time range)  potassium chloride 10 mEq in 100 mL IVPB (10 mEq Intravenous Not Given 06/29/22 0603)  ceFEPIme (MAXIPIME) 2 g in sodium chloride 0.9 % 100 mL IVPB (0 g Intravenous Stopped 06/29/22 0505)  vancomycin  (VANCOCIN) IVPB 1000 mg/200 mL premix (0 mg Intravenous Stopped 06/29/22 0544)    Followed by  vancomycin (VANCOREADY) IVPB 1500 mg/300 mL (1,500 mg Intravenous New Bag/Given 06/29/22 0604)  vancomycin (VANCOREADY) IVPB 1250 mg/250 mL (has no administration in time range)  calcium gluconate 1 g/ 50 mL sodium chloride IVPB (has no administration in time range)  iohexol (OMNIPAQUE) 350 MG/ML injection 125 mL (125 mLs Intravenous Contrast Given 06/29/22 0201)  lactated ringers bolus 1,000 mL (0 mLs Intravenous Stopped 06/29/22 0323)  lactated ringers bolus 1,000 mL (0 mLs Intravenous Stopped 06/29/22 0444)  heparin bolus via infusion 5,000 Units (5,000 Units Intravenous Bolus from Bag 06/29/22 0337)  aspirin chewable tablet 324 mg (324 mg Oral Given 06/29/22 0339)  iohexol (OMNIPAQUE) 350 MG/ML injection 100 mL (100 mLs Intravenous Contrast Given 06/29/22 0358)  lactated ringers bolus 1,000 mL (1,000 mLs Intravenous New Bag/Given 06/29/22 0635)     IMPRESSION / MDM / ASSESSMENT AND PLAN / ED COURSE  I reviewed the triage vital signs and the nursing notes.  Differential diagnosis includes, but is not limited to, stroke, seizure, DKA, PE, ACS  {Patient presents with symptoms of an acute illness or injury that is potentially life-threatening.  Patient presents to the ED with left-sided weakness and found to have evidence of acute proximal PE and possible acute stroke.  There is some uncertainty on arrival regarding the chronicity of his left-sided weakness.  Code stroke was activated on arrival to the ED and angiogram imaging confirms no bleed and occlusion of the right A2 ACA of uncertain chronicity.  Perfusion studies are nondiagnostic.  The stroke imaging also partially visualizes evidence of proximal and significant burden of acute PE.  Initially some hesitations regarding heparin provision and I discussed this with neurology and the recommend going ahead with heparin after they review the case and  CNS imaging.  Would not be a thrombectomy candidate.  Reports that bleeding risk is present but we will still recommend going ahead.  Patient remains stable with improving soft blood pressures with fluid resuscitation.  Maintaining his airway without indication for ventricular intubation.  Blood work with evidence of mild DKA for which she receives IV fluids.  I discussed insulin drip with ICU admitting nurse practitioner.   Admitted to ICU  Clinical Course as of 06/29/22 0655  Tue Jun 29, 2022  0211 Call from rads [DS]  0231 Call from rads regarding angiogram imaging, PE. I update nurse [DS]  0246 Another call from radiology.  ACA clot of uncertain chronicity.  Perfusion study was nondiagnostic [DS]  0257 Reassessed.  I had ordered heparin for treatment of his bilateral PE, but with the possibility of an acute stroke with an acute ACA clot possibility  and the risk of hemorrhagic conversion, I asked the nurse to hold heparin for now until we talk to neurology [DS]  0303 Vbg thankfully without signs of severe DKA, an original concern. pH is a little low. Will await CMP , betahydroxy for further details [DS]  0317 Reassess to push and try to get neurology input in the situation.  With the teleneurology cart is in the room, but no one on the screen.  I am told by nursing staff that the alert button has been pressed and we are waiting.  I see no evidence of this and pressed the alert button and Varney Baas is unaware of this case and I expressed my concern and urgency requesting a neurology consultation [DS]  0327 I get a Dr. Patricia Nettle on the line, TeleNeuro, we discussed the presentation and he does recommend heparinization, acknowledging the difficult situation.  Serial CT heads. [DS]  1610 I consult with ICU NP, agrees to admit [DS]    Clinical Course User Index [DS] Delton Prairie, MD     FINAL CLINICAL IMPRESSION(S) / ED DIAGNOSES   Final diagnoses:  Other acute pulmonary embolism with acute cor  pulmonale (HCC)     Rx / DC Orders   ED Discharge Orders     None        Note:  This document was prepared using Dragon voice recognition software and may include unintentional dictation errors.   Delton Prairie, MD 06/29/22 0700

## 2022-06-30 ENCOUNTER — Encounter
Admission: EM | Discharge status: Against Medical Advice | Payer: Self-pay | Source: Home / Self Care | Attending: Internal Medicine

## 2022-06-30 ENCOUNTER — Other Ambulatory Visit (HOSPITAL_COMMUNITY): Payer: Self-pay

## 2022-06-30 ENCOUNTER — Telehealth (HOSPITAL_COMMUNITY): Payer: Self-pay | Admitting: Pharmacy Technician

## 2022-06-30 ENCOUNTER — Encounter: Payer: Self-pay | Admitting: Internal Medicine

## 2022-06-30 ENCOUNTER — Other Ambulatory Visit: Payer: Self-pay

## 2022-06-30 DIAGNOSIS — E1165 Type 2 diabetes mellitus with hyperglycemia: Secondary | ICD-10-CM

## 2022-06-30 DIAGNOSIS — F1410 Cocaine abuse, uncomplicated: Secondary | ICD-10-CM | POA: Diagnosis not present

## 2022-06-30 DIAGNOSIS — Z794 Long term (current) use of insulin: Secondary | ICD-10-CM

## 2022-06-30 DIAGNOSIS — I2699 Other pulmonary embolism without acute cor pulmonale: Secondary | ICD-10-CM | POA: Diagnosis not present

## 2022-06-30 DIAGNOSIS — R7881 Bacteremia: Secondary | ICD-10-CM

## 2022-06-30 DIAGNOSIS — R7989 Other specified abnormal findings of blood chemistry: Secondary | ICD-10-CM

## 2022-06-30 DIAGNOSIS — I50810 Right heart failure, unspecified: Secondary | ICD-10-CM | POA: Diagnosis not present

## 2022-06-30 DIAGNOSIS — I2609 Other pulmonary embolism with acute cor pulmonale: Secondary | ICD-10-CM

## 2022-06-30 DIAGNOSIS — E875 Hyperkalemia: Secondary | ICD-10-CM

## 2022-06-30 DIAGNOSIS — T801XXA Vascular complications following infusion, transfusion and therapeutic injection, initial encounter: Secondary | ICD-10-CM | POA: Insufficient documentation

## 2022-06-30 DIAGNOSIS — N179 Acute kidney failure, unspecified: Secondary | ICD-10-CM

## 2022-06-30 HISTORY — PX: PULMONARY THROMBECTOMY: CATH118295

## 2022-06-30 LAB — CBC
HCT: 39.7 % (ref 39.0–52.0)
Hemoglobin: 13.1 g/dL (ref 13.0–17.0)
MCH: 30.7 pg (ref 26.0–34.0)
MCHC: 33 g/dL (ref 30.0–36.0)
MCV: 93 fL (ref 80.0–100.0)
Platelets: 182 10*3/uL (ref 150–400)
RBC: 4.27 MIL/uL (ref 4.22–5.81)
RDW: 13 % (ref 11.5–15.5)
WBC: 7.3 10*3/uL (ref 4.0–10.5)
nRBC: 0 % (ref 0.0–0.2)

## 2022-06-30 LAB — HEPARIN LEVEL (UNFRACTIONATED)
Heparin Unfractionated: 0.25 IU/mL — ABNORMAL LOW (ref 0.30–0.70)
Heparin Unfractionated: 0.3 IU/mL (ref 0.30–0.70)

## 2022-06-30 LAB — LEGIONELLA PNEUMOPHILA SEROGP 1 UR AG: L. pneumophila Serogp 1 Ur Ag: NEGATIVE

## 2022-06-30 LAB — GLUCOSE, CAPILLARY
Glucose-Capillary: 148 mg/dL — ABNORMAL HIGH (ref 70–99)
Glucose-Capillary: 218 mg/dL — ABNORMAL HIGH (ref 70–99)
Glucose-Capillary: 265 mg/dL — ABNORMAL HIGH (ref 70–99)
Glucose-Capillary: 195 mg/dL — ABNORMAL HIGH (ref 70–99)
Glucose-Capillary: 207 mg/dL — ABNORMAL HIGH (ref 70–99)
Glucose-Capillary: 221 mg/dL — ABNORMAL HIGH (ref 70–99)
Glucose-Capillary: 264 mg/dL — ABNORMAL HIGH (ref 70–99)

## 2022-06-30 LAB — CULTURE, BLOOD (ROUTINE X 2): Special Requests: ADEQUATE

## 2022-06-30 SURGERY — PULMONARY THROMBECTOMY
Anesthesia: Moderate Sedation | Laterality: Bilateral

## 2022-06-30 MED ORDER — HEPARIN SODIUM (PORCINE) 1000 UNIT/ML IJ SOLN
INTRAMUSCULAR | Status: DC | PRN
  Administered 2022-06-30: 4000 [IU] via INTRAVENOUS

## 2022-06-30 MED ORDER — DOXYCYCLINE HYCLATE 100 MG PO TABS: 100.0000 mg | ORAL_TABLET | Freq: Two times a day (BID) | ORAL | Status: DC

## 2022-06-30 MED ORDER — SODIUM CHLORIDE 0.9 % IV SOLN: INTRAVENOUS | Status: DC

## 2022-06-30 MED ORDER — HEPARIN BOLUS VIA INFUSION
1400.0000 [IU] | Freq: Once | INTRAVENOUS | Status: AC
  Administered 2022-06-30: 1400 [IU] via INTRAVENOUS
  Filled 2022-06-30: qty 1400

## 2022-06-30 MED ORDER — MIDAZOLAM HCL 2 MG/2ML IJ SOLN
INTRAMUSCULAR | Status: DC | PRN
  Administered 2022-06-30: 1 mg via INTRAVENOUS
  Administered 2022-06-30: 2 mg via INTRAVENOUS

## 2022-06-30 MED ORDER — DOXYCYCLINE HYCLATE 100 MG PO TABS: 100.0000 mg | ORAL_TABLET | Freq: Two times a day (BID) | ORAL | 0 refills | Status: DC

## 2022-06-30 MED ORDER — LANCETS MISC
1.0000 | Freq: Three times a day (TID) | 0 refills | Status: DC
Start: 2022-06-30 — End: 2023-02-26

## 2022-06-30 MED ORDER — DIPHENHYDRAMINE HCL 50 MG/ML IJ SOLN: 50.0000 mg | Freq: Once | INTRAMUSCULAR | Status: DC | PRN

## 2022-06-30 MED ORDER — POLYETHYLENE GLYCOL 3350 17 G PO PACK: 17.0000 g | PACK | Freq: Every day | ORAL | 0 refills | Status: DC | PRN

## 2022-06-30 MED ORDER — FAMOTIDINE 20 MG PO TABS: 40.0000 mg | ORAL_TABLET | Freq: Once | ORAL | Status: DC | PRN

## 2022-06-30 MED ORDER — APIXABAN 5 MG PO TABS
10.0000 mg | ORAL_TABLET | Freq: Two times a day (BID) | ORAL | Status: DC
  Administered 2022-06-30: 10 mg via ORAL
  Filled 2022-06-30: qty 2

## 2022-06-30 MED ORDER — HEPARIN SODIUM (PORCINE) 1000 UNIT/ML IJ SOLN
INTRAMUSCULAR | Status: AC
  Filled 2022-06-30: qty 10

## 2022-06-30 MED ORDER — APIXABAN 5 MG PO TABS: ORAL_TABLET | ORAL | 0 refills | Status: DC

## 2022-06-30 MED ORDER — MIDAZOLAM HCL 2 MG/ML PO SYRP: 8.0000 mg | ORAL_SOLUTION | Freq: Once | ORAL | Status: DC | PRN

## 2022-06-30 MED ORDER — ONDANSETRON HCL 4 MG/2ML IJ SOLN: 4.0000 mg | Freq: Four times a day (QID) | INTRAMUSCULAR | Status: DC | PRN

## 2022-06-30 MED ORDER — INSULIN ASPART 100 UNIT/ML FLEXPEN: PEN_INJECTOR | SUBCUTANEOUS | 0 refills | Status: DC

## 2022-06-30 MED ORDER — APIXABAN 5 MG PO TABS: 5.0000 mg | ORAL_TABLET | Freq: Two times a day (BID) | ORAL | Status: DC

## 2022-06-30 MED ORDER — INSULIN GLARGINE 100 UNIT/ML SOLOSTAR PEN
20.0000 [IU] | PEN_INJECTOR | Freq: Every day | SUBCUTANEOUS | 0 refills | Status: DC
Start: 2022-06-30 — End: 2022-07-28

## 2022-06-30 MED ORDER — MIDAZOLAM HCL 2 MG/2ML IJ SOLN
INTRAMUSCULAR | Status: AC
  Filled 2022-06-30: qty 2

## 2022-06-30 MED ORDER — LANCET DEVICE MISC
1.0000 | Freq: Three times a day (TID) | 0 refills | Status: DC
Start: 2022-06-30 — End: 2022-07-28

## 2022-06-30 MED ORDER — IODIXANOL 320 MG/ML IV SOLN
INTRAVENOUS | Status: DC | PRN
  Administered 2022-06-30: 85 mL

## 2022-06-30 MED ORDER — BLOOD GLUCOSE TEST VI STRP
1.0000 | ORAL_STRIP | Freq: Three times a day (TID) | 0 refills | Status: DC
Start: 2022-06-30 — End: 2022-07-28

## 2022-06-30 MED ORDER — METHYLPREDNISOLONE SODIUM SUCC 125 MG IJ SOLR: 125.0000 mg | Freq: Once | INTRAMUSCULAR | Status: DC | PRN

## 2022-06-30 MED ORDER — APIXABAN 5 MG PO TABS: 10.0000 mg | ORAL_TABLET | Freq: Two times a day (BID) | ORAL | Status: DC

## 2022-06-30 MED ORDER — INSULIN ASPART 100 UNIT/ML IJ SOLN: 3.0000 [IU] | Freq: Three times a day (TID) | INTRAMUSCULAR | Status: DC

## 2022-06-30 MED ORDER — ALTEPLASE 2 MG IJ SOLR
INTRAMUSCULAR | Status: DC | PRN
  Administered 2022-06-30 (×2): 5 mg

## 2022-06-30 MED ORDER — FENTANYL CITRATE (PF) 100 MCG/2ML IJ SOLN
INTRAMUSCULAR | Status: DC | PRN
  Administered 2022-06-30: 25 ug via INTRAVENOUS
  Administered 2022-06-30: 50 ug via INTRAVENOUS

## 2022-06-30 MED ORDER — BLOOD GLUCOSE MONITORING SUPPL DEVI: 1.0000 | Freq: Three times a day (TID) | 0 refills | Status: DC

## 2022-06-30 MED ORDER — ALTEPLASE 2 MG IJ SOLR
INTRAMUSCULAR | Status: AC
  Filled 2022-06-30: qty 10

## 2022-06-30 MED ORDER — PEN NEEDLES 31G X 5 MM MISC: 1.0000 | Freq: Three times a day (TID) | 0 refills | Status: DC

## 2022-06-30 MED ORDER — CEFAZOLIN SODIUM-DEXTROSE 2-4 GM/100ML-% IV SOLN: 2.0000 g | INTRAVENOUS | Status: DC

## 2022-06-30 MED ORDER — FENTANYL CITRATE (PF) 100 MCG/2ML IJ SOLN
INTRAMUSCULAR | Status: AC
  Filled 2022-06-30: qty 2

## 2022-06-30 MED ORDER — INSULIN ASPART 100 UNIT/ML IJ SOLN: 0.0000 [IU] | Freq: Every day | INTRAMUSCULAR | Status: DC

## 2022-06-30 MED ORDER — HEPARIN (PORCINE) 25000 UT/250ML-% IV SOLN: 1600.0000 [IU]/h | INTRAVENOUS | Status: DC

## 2022-06-30 MED ORDER — INSULIN ASPART 100 UNIT/ML IJ SOLN: 0.0000 [IU] | Freq: Three times a day (TID) | INTRAMUSCULAR | Status: DC

## 2022-06-30 MED ORDER — HYDROMORPHONE HCL 1 MG/ML IJ SOLN: 1.0000 mg | Freq: Once | INTRAMUSCULAR | Status: DC | PRN

## 2022-06-30 SURGICAL SUPPLY — 22 items
CANISTER PENUMBRA ENGINE (MISCELLANEOUS) IMPLANT
CATH ANGIO 5F PIGTAIL 100CM (CATHETERS) IMPLANT
CATH INDIGO 12XTORQ 100 (CATHETERS) IMPLANT
CATH INDIGO SEP 12 (CATHETERS) IMPLANT
CATH INFINITI JR4 5F (CATHETERS) IMPLANT
CATH SELECT BERN TIP 5F 130 (CATHETERS) IMPLANT
CLOSURE PERCLOSE PROSTYLE (VASCULAR PRODUCTS) IMPLANT
DEVICE TORQUE (MISCELLANEOUS) IMPLANT
GLIDEWIRE ANGLED SS 035X260CM (WIRE) IMPLANT
NDL ENTRY 21GA 7CM ECHOTIP (NEEDLE) IMPLANT
NDL PERC 21GX4CM (NEEDLE) IMPLANT
NEEDLE ENTRY 21GA 7CM ECHOTIP (NEEDLE) ×1 IMPLANT
NEEDLE PERC 21GX4CM (NEEDLE) ×1 IMPLANT
PACK ANGIOGRAPHY (CUSTOM PROCEDURE TRAY) ×1 IMPLANT
SET INTRO CAPELLA COAXIAL (SET/KITS/TRAYS/PACK) IMPLANT
SHEATH BRITE TIP 6FRX11 (SHEATH) IMPLANT
SUT MNCRL AB 4-0 PS2 18 (SUTURE) IMPLANT
SUT SILK 0 FSL (SUTURE) IMPLANT
SYR MEDRAD MARK 7 150ML (SYRINGE) IMPLANT
TUBING CONTRAST HIGH PRESS 72 (TUBING) IMPLANT
WIRE AMPLATZ SSTIFF .035X260CM (WIRE) IMPLANT
WIRE GUIDERIGHT .035X150 (WIRE) IMPLANT

## 2022-06-30 NOTE — TOC Benefit Eligibility Note (Signed)
Patient Advocate Encounter  Insurance verification completed.    The patient is currently admitted and upon discharge could be taking Eliquis 5 mg.  The current 30 day co-pay is $4.00.   The patient is insured through [place] [employer]   This test claim was processed through [facility]- copay amounts may vary at other pharmacies due to pharmacy/plan contracts, or as the patient moves through the different stages of their insurance plan.  Carmilla Granville, [facility] Patient Advocate Specialist [facility] Patient Advocate Team Direct Number: [phone]  Fax: [phone]       

## 2022-06-30 NOTE — Assessment & Plan Note (Signed)
Staph species growing out of blood culture.  Still could be a contaminant.  Patient received vancomycin here.  Will prescribe doxycycline in case he leaves today.  Repeat blood cultures drawn today.

## 2022-06-30 NOTE — Assessment & Plan Note (Signed)
Secondary to elevated sugars 

## 2022-06-30 NOTE — Discharge Summary (Addendum)
Physician Discharge Summary   Patient: Henry Munoz MRN: [id number] DOB: 1965/04/02  Admit date:     06/29/2022  Date of signing out AGAINST MEDICAL ADVICE 06/30/22  Discharge Physician: Alford Highland   PCP: [facility]   Recommendations at discharge:    Follow up PCP 5 days  Follow up with vascular surgery 3-4 weeks  Discharge Diagnoses: Principal Problem:   Pulmonary embolus (HCC) Active Problems:   Positive blood culture   Cocaine abuse (HCC)   Uncontrolled type 2 diabetes mellitus with hyperglycemia, with long-term current use of insulin (HCC)   Pseudohyponatremia   AKI (acute kidney injury) (HCC)   Hyperkalemia   IV infiltration, initial encounter  Resolved Problems:   * No resolved hospital problems. *  Hospital Course: 57 y.o  male with significant PMH of Diabetes Mellitus, schizophrenia, Hypertension, CVA/TIA, polysubstance abuse who presented to the ED with chief complaints of left-sided weakness.   Per ED report, patient was found in a parking lot by friends who reported onset of symptoms at approximately 0110 with no reports of other associated symptoms.   ED Course: Initial vital signs showed HR of 114 beats/minute, BP 89/70 mm Hg, the RR 28 breaths/minute, and the oxygen saturation 97% on 2 L and a temperature of 100.32F (37.9C).  Finding as above concerning for acute stroke code stroke was activated at 03:15.  Stat CT head, CTA head and neck and CT perfusion study was obtained and patient evaluated by teleneurologist who deemed patient not a candidate for thrombolytics.   Pertinent Labs/Diagnostics Findings: Na+/ K+: 132/3.8 glucose: 444 BUN/Cr.:  18/1.55  AST/ALT: 73/64, beta hydroxybutyrate 3.09 WBC: Hgb/Hct: Plts:  PCT: pending Lactic acid: 2.9 COVID PCR: Negative, Troponin: 362 BNP: 205 UDS +Cocaine VBG:pO2 46; pCO2 43; pH 7.29;  HCO3 20.7, %O2 Sat 20.3.    CTA head and neck showed large bilateral pulmonary arterial filling  defect, consistent with acute pulmonary embolism and occlusion of the proximal right A2 segment of the anterior cerebral artery.  Follow-up CTA chest was obtained which confirmed large volume of acute pulmonary emboli affecting all lobar branches with right heart strain.  Patient was started on heparin drip for PE protocol.  PCCM consulted for admission.  5/22.  Patient had a thrombectomy this morning for bilateral PE.  As soon as he got back from the thrombectomy he wanted to go home.  Patient had an IV infiltration of heparin on his right arm.  Patient also had positive blood cultures which could be skin contaminant secondary to staph species on admission.  Repeat blood cultures drawn.  Patient was placed on empiric vancomycin.  I explained to to the patient that would rather watch his blood cultures another day or so to make sure ID treat infection.  Patient was switched over to Eliquis prior to signing out AGAINST MEDICAL ADVICE.  I spoke with the patient's mother and brother about him signing out against medical advise.  I would like to continue to watch him another day in the hospital at least.  I went back to the patient's room to try to convince him not to leave.  Risk of death explained.  Assessment and Plan: * Pulmonary embolus [facility]) Patient with massive pulmonary emboli with significant heart strain and hypoxia.  Patient was brought for thrombectomy today by Dr. Gilda Crease.  Right after procedure he felt well and wanted to go home.  I told him that I did not want to send him home today since he  did not have a procedure.  Patient insisted on going and signed out AGAINST MEDICAL ADVICE.  Before signing out AGAINST MEDICAL ADVICE I switched his heparin drip over to Eliquis just in case.  I did prescribe 30 days of Eliquis just in case he leaves today.  I advised I do not give any pain medications for those people at sign out AGAINST MEDICAL ADVICE.  Would also recommend a bilateral lower extremity  ultrasound as outpatient if he leaves today.  Positive blood culture Staph species growing out of blood culture.  Still could be a contaminant.  Patient received vancomycin here.  Will prescribe doxycycline in case he leaves today.  Repeat blood cultures drawn today.  Cocaine abuse (HCC) Advised not to use cocaine.  Uncontrolled type 2 diabetes mellitus with hyperglycemia, with long-term current use of insulin (HCC) Mild DKA on presentation.  Hemoglobin A1c elevated at 14.  Patient initially was on insulin drip and switched over to [facility] insulin and will increase up to 20 units daily and short acting insulin prior to meals.  IV infiltration, initial encounter Right arm secondary to heparin drip.  Called nursing staff then removed that IV and switched it over to the other arm this morning.  Hyperkalemia Secondary to acute kidney injury, mild DKA.  AKI (acute kidney injury) (HCC) Creatinine 1.55 on presentation and down to 1.12 upon signing out AGAINST MEDICAL ADVICE  Pseudohyponatremia Secondary to elevated sugars         Consultants: Vascular surgery Procedures performed: Thrombectomy for pulmonary embolism Disposition: Home Diet recommendation:  Carb modified diet DISCHARGE MEDICATION: Allergies as of 06/30/2022       Reactions   Haldol [haloperidol Lactate]         Medication List     STOP taking these medications    UltiCare Insulin Syringe 31G X 1/4" 0.5 ML Misc Generic drug: Insulin Syringe-Needle U-100       TAKE these medications    Antifungal Clotrimazole 1 % cream Generic drug: clotrimazole Apply 1 Application topically 2 (two) times daily.   apixaban 5 MG Tabs tablet Commonly known as: ELIQUIS Two tabs po twice a day for one week then one tablet twice a day afterwards   Blood Glucose Monitoring Suppl Devi 1 each by Does not apply route 3 (three) times daily. May dispense any manufacturer covered by patient's insurance.   BLOOD GLUCOSE TEST  STRIPS Strp 1 each by Does not apply route 3 (three) times daily. Use as directed to check blood sugar. May dispense any manufacturer covered by patient's insurance and fits patient's device.   doxycycline 100 MG tablet Commonly known as: VIBRA-TABS Take 1 tablet (100 mg total) by mouth every 12 (twelve) hours for 10 days.   insulin aspart 100 UNIT/ML FlexPen Commonly known as: NOVOLOG If eating and Blood Glucose (BG) 80 or higher inject 4 units for meal coverage and add correction dose per scale. If not eating, correction dose only. BG <150= 0 unit; BG 150-200= 1 unit; BG 201-250= 2 unit; BG 251-300= 3 unit; BG 301-350= 4 unit; BG 351-400= 5 unit; BG >400= 6 unit and Call Primary care.   insulin glargine 100 UNIT/ML Solostar Pen Commonly known as: LANTUS Inject 20 Units into the skin daily. May substitute as needed per insurance. What changed:  how much to take additional instructions   Lancet Device Misc 1 each by Does not apply route 3 (three) times daily. May dispense any manufacturer covered by patient's insurance.   Lancets Misc  1 each by Does not apply route 3 (three) times daily. Use as directed to check blood sugar. May dispense any manufacturer covered by patient's insurance and fits patient's device.   Pen Needles 31G X 5 MM Misc 1 each by Does not apply route 3 (three) times daily. May dispense any manufacturer covered by patient's insurance.   polyethylene glycol 17 g packet Commonly known as: MIRALAX / GLYCOLAX Take 17 g by mouth daily as needed for moderate constipation.        Follow-up Information     [facility] Follow up in 5 day(s).   Specialty: General Practice Contact information: [address] [place] [address] [phone]         Schnier, Latina Craver, MD Follow up in 4 week(s).   Specialties: Vascular Surgery, Cardiology, Radiology, Vascular Surgery Contact information: [address] [place]  [address] [phone]                Discharge Exam: Ceasar Mons Weights   06/29/22 0304 06/30/22 0415 06/30/22 0808  Weight: 114.4 kg 116.3 kg 116.3 kg   Physical Exam HENT:     Head: Normocephalic.     Mouth/Throat:     Pharynx: No oropharyngeal exudate.  Eyes:     General: Lids are normal.     Conjunctiva/sclera: Conjunctivae normal.  Cardiovascular:     Rate and Rhythm: Normal rate and regular rhythm.     Heart sounds: Normal heart sounds, S1 normal and S2 normal.  Pulmonary:     Breath sounds: Examination of the right-lower field reveals decreased breath sounds. Examination of the left-lower field reveals decreased breath sounds. Decreased breath sounds present. No wheezing, rhonchi or rales.  Abdominal:     Palpations: Abdomen is soft.     Tenderness: There is no abdominal tenderness.  Musculoskeletal:     Right lower leg: Swelling present.     Left lower leg: Swelling present.  Skin:    General: Skin is warm.     Findings: No rash.  Neurological:     Mental Status: He is alert.     Comments: Patient was insistent on leaving today even though he had a procedure.      Condition at discharge: fair  The results of significant diagnostics from this hospitalization (including imaging, microbiology, ancillary and laboratory) are listed below for reference.   Imaging Studies: PERIPHERAL VASCULAR CATHETERIZATION  Result Date: 06/30/2022 See surgical note for result.  ECHOCARDIOGRAM COMPLETE  Result Date: 06/29/2022    ECHOCARDIOGRAM REPORT   Patient Name:   DAMU MADANI Date of Exam: 06/29/2022 Medical Rec #:  [id number]         Height:       67.0 in Accession #:    [id number]        Weight:       252.2 lb Date of Birth:  [birth year]         BSA:          2.232 m Patient Age:    56 years          BP:           127/67 mmHg Patient Gender: M                 HR:           86 bpm. Exam Location:  [facility] Procedure: 2D Echo, Cardiac Doppler and Color Doppler Indications:      Stroke  63.9  History:         Patient has prior history of Echocardiogram examinations, most                  recent 02/01/2016. TIA; Risk Factors:Diabetes and Hypertension.                  Schizophrenia.  Sonographer:     Cristela Blue Referring Phys:  [id number] Hubbard Hartshorn OUMA Diagnosing Phys: Yvonne Kendall MD IMPRESSIONS  1. Left ventricular ejection fraction, by estimation, is 50 to 55%. The left ventricle has low normal function. The left ventricle has no regional wall motion abnormalities. moderate to severe left ventricular hypertrophy. Left ventricular diastolic parameters are consistent with Grade I diastolic dysfunction (impaired relaxation).  2. Right ventricular systolic function is moderately reduced. The right ventricular size is mildly enlarged. Tricuspid regurgitation signal is inadequate for assessing PA pressure.  3. The mitral valve is abnormal. Trivial mitral valve regurgitation. 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  4. The aortic valve is suspicious for bicuspid morphology but is suboptimally imaged. There is mild thickening of the aortic valve. Aortic valve regurgitation is trivial. Aortic valve sclerosis is present, with no evidence of aortic valve stenosis. FINDINGS  Left Ventricle: Left ventricular ejection fraction, by estimation, is 50 to 55%. The left ventricle has low normal function. The left ventricle has no regional wall motion abnormalities. The left ventricular internal cavity size was normal in size. Moderate to severe left ventricular hypertrophy. Left ventricular diastolic parameters are consistent with Grade I diastolic dysfunction (impaired relaxation). Right Ventricle: The right ventricular size is mildly enlarged. No increase in right ventricular wall thickness. Right ventricular systolic function is moderately reduced. Tricuspid regurgitation signal is inadequate for assessing PA pressure. Left Atrium: Left atrial size was normal  in size. Right Atrium: Right atrial size was normal in size. Pericardium: There is no evidence of pericardial effusion. Mitral Valve: The mitral valve is abnormal. 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 Trivial mitral valve regurgitation. Tricuspid Valve: The tricuspid valve is grossly normal. Tricuspid valve regurgitation is trivial. Aortic Valve: The aortic valve is suspicious for bicuspid morphology but is suboptimally imaged. There is mild thickening of the aortic valve. There is mild aortic valve annular calcification. Aortic valve regurgitation is trivial. Aortic valve sclerosis  is present, with no evidence of aortic valve stenosis. Aortic valve mean gradient measures 2.0 mmHg. Aortic valve peak gradient measures 4.0 mmHg. Aortic valve area, by VTI measures 2.87 cm. Pulmonic Valve: The pulmonic valve was not well visualized. Pulmonic valve regurgitation is not visualized. No evidence of pulmonic stenosis. Aorta: The aortic root is normal in size and structure. Pulmonary Artery: The pulmonary artery is not well seen. Venous: The inferior vena cava was not well visualized. IAS/Shunts: The interatrial septum was not well visualized.  LEFT VENTRICLE PLAX 2D LVIDd:         3.90 cm   Diastology LVIDs:         2.90 cm   LV e' medial:    4.46 cm/s LV PW:         1.54 cm   LV E/e' medial:  9.8 LV IVS:        1.62 cm   LV e' lateral:   6.42 cm/s LVOT diam:     2.10 cm   LV E/e' lateral: 6.8 LV SV:  33 LV SV Index:   15 LVOT Area:     3.46 cm  RIGHT VENTRICLE RV Mid diam:    3.65 cm RV S prime:     6.20 cm/s TAPSE (M-mode): 1.3 cm LEFT ATRIUM             Index        RIGHT ATRIUM           Index LA diam:        2.40 cm 1.08 cm/m   RA Area:     12.30 cm LA Vol (A2C):   22.9 ml 10.26 ml/m  RA Volume:   31.60 ml  14.16 ml/m LA Vol (A4C):   17.0 ml 7.62 ml/m LA Biplane Vol: 21.2 ml 9.50 ml/m  AORTIC VALVE AV Area (Vmax):    2.57 cm AV Area (Vmean):   2.66 cm AV Area  (VTI):     2.87 cm AV Vmax:           100.00 cm/s AV Vmean:          69.100 cm/s AV VTI:            0.116 m AV Peak Grad:      4.0 mmHg AV Mean Grad:      2.0 mmHg LVOT Vmax:         74.20 cm/s LVOT Vmean:        53.100 cm/s LVOT VTI:          0.096 m LVOT/AV VTI ratio: 0.83  AORTA Ao Root diam: 2.90 cm MITRAL VALVE MV Area (PHT): 3.03 cm    SHUNTS MV Decel Time: 250 msec    Systemic VTI:  0.10 m MV E velocity: 43.50 cm/s  Systemic Diam: 2.10 cm MV A velocity: 66.60 cm/s MV E/A ratio:  0.65 Cristal Deer End MD Electronically signed by Yvonne Kendall MD Signature Date/Time: 06/29/2022/5:54:03 PM    Final    MR BRAIN WO CONTRAST  Result Date: 06/29/2022 CLINICAL DATA:  Stroke, follow up EXAM: MRI HEAD WITHOUT CONTRAST TECHNIQUE: Multiplanar, multiecho pulse sequences of the brain and surrounding structures were obtained without intravenous contrast. COMPARISON:  CT Head, head/neck angiogram, CT perfusion 06/29/22 FINDINGS: Brain: Negative for an acute infarct. No hemorrhage. No extra-axial fluid collection. No hydrocephalus. There is a chronic right MCA territory infarct. Sequela of moderate to severe chronic microvascular ischemic change. Vascular: Normal flow voids. Skull and upper cervical spine: Normal marrow signal. Sinuses/Orbits: No middle ear or mastoid effusion. Trace mucosal thickening bilateral maxillary sinuses. Orbits are unremarkable. Other: None. IMPRESSION: 1. No acute intracranial process. 2. Chronic right MCA territory infarct. Electronically Signed   By: Lorenza Cambridge M.D.   On: 06/29/2022 12:45   DG Abd 1 View  Result Date: 06/29/2022 CLINICAL DATA:  MRI clearance. EXAM: ABDOMEN - 1 VIEW COMPARISON:  None Available. FINDINGS: Mild gaseous distention of the stomach. No gaseous dilatation of small bowel or colon. Visualized bony anatomy unremarkable. Foley catheter overlies the low pelvis. No unexpected radiopaque/metallic foreign body identified over the visualized abdomen or pelvis.  IMPRESSION: 1. No unexpected radiopaque/metallic foreign body. 2. Mild gaseous distention of the stomach. Electronically Signed   By: Kennith Center M.D.   On: 06/29/2022 05:15   DG Chest Port 1 View  Result Date: 06/29/2022 CLINICAL DATA:  MRI clearance. EXAM: PORTABLE CHEST 1 VIEW COMPARISON:  10/09/2017 FINDINGS: The lungs are clear without focal pneumonia, edema, pneumothorax or pleural effusion. Cardiopericardial silhouette is at upper limits of normal  for size. No acute bony abnormality. Telemetry leads overlie the chest. No evidence for unexpected radiopaque foreign body overlying the visualized thorax. IMPRESSION: 1. No acute cardiopulmonary findings. 2. No evidence for unexpected radiopaque/metallic foreign body. Electronically Signed   By: Kennith Center M.D.   On: 06/29/2022 05:13   CT Angio Chest PE W and/or Wo Contrast  Result Date: 06/29/2022 CLINICAL DATA:  Diaphoresis.  Found in parking lot EXAM: CT ANGIOGRAPHY CHEST WITH CONTRAST TECHNIQUE: Multidetector CT imaging of the chest was performed using the standard protocol during bolus administration of intravenous contrast. Multiplanar CT image reconstructions and MIPs were obtained to evaluate the vascular anatomy. RADIATION DOSE REDUCTION: This exam was performed according to the departmental dose-optimization program which includes automated exposure control, adjustment of the mA and/or kV according to patient size and/or use of iterative reconstruction technique. CONTRAST:  OMNIPAQUE IOHEXOL 350 MG/ML SOLN COMPARISON:  None Available. FINDINGS: Cardiovascular: Satisfactory opacification of the pulmonary arteries to the segmental level. Large volume of acute appearing clot in the bilateral main pulmonary arteries extending into all lobes with multiple occlusive segmental branches. Right heart strain which may be accentuated by left ventricular hypertrophy, RV to LV ratio measures 2.2. Atheromatous plaque along the coronaries.  Mediastinum/Nodes: No mass or adenopathy Lungs/Pleura: No failure or infarct seen. Upper Abdomen: Contrast in the kidneys from recent CTA of the head and neck Musculoskeletal: No acute finding. Review of the MIP images confirms the above findings. IMPRESSION: 1. Large volume of acute pulmonary emboli affecting all lobar branches with right heart strain. 2. Coronary atherosclerosis. Electronically Signed   By: Tiburcio Pea M.D.   On: 06/29/2022 04:09   CT CEREBRAL PERFUSION W CONTRAST  Result Date: 06/29/2022 CLINICAL DATA:  Left-sided weakness EXAM: CT ANGIOGRAPHY HEAD AND NECK CT PERFUSION BRAIN TECHNIQUE: Multidetector CT imaging of the head and neck was performed using the standard protocol during bolus administration of intravenous contrast. Multiplanar CT image reconstructions and MIPs were obtained to evaluate the vascular anatomy. Carotid stenosis measurements (when applicable) are obtained utilizing NASCET criteria, using the distal internal carotid diameter as the denominator. Multiphase CT imaging of the brain was performed following IV bolus contrast injection. Subsequent parametric perfusion maps were calculated using RAPID software. RADIATION DOSE REDUCTION: This exam was performed according to the departmental dose-optimization program which includes automated exposure control, adjustment of the mA and/or kV according to patient size and/or use of iterative reconstruction technique. CONTRAST:  OMNIPAQUE IOHEXOL 350 MG/ML SOLN COMPARISON:  None Available. FINDINGS: CTA NECK FINDINGS SKELETON: There is no bony spinal canal stenosis. No lytic or blastic lesion. OTHER NECK: Normal pharynx, larynx and major salivary glands. No cervical lymphadenopathy. Unremarkable thyroid gland. UPPER CHEST: There are large bilateral pulmonary arterial filling defects. AORTIC ARCH: There is no calcific atherosclerosis of the aortic arch. There is no aneurysm, dissection or hemodynamically significant stenosis  of the visualized portion of the aorta. Conventional 3 vessel aortic branching pattern. The visualized proximal subclavian arteries are widely patent. RIGHT CAROTID SYSTEM: Normal without aneurysm, dissection or stenosis. LEFT CAROTID SYSTEM: No dissection, occlusion or aneurysm. There is mixed density atherosclerosis extending into the proximal ICA, resulting in less than 50% stenosis. VERTEBRAL ARTERIES: Codominant configuration. Both origins are clearly patent. There is no dissection, occlusion or flow-limiting stenosis to the skull base (V1-V3 segments). CTA HEAD FINDINGS POSTERIOR CIRCULATION: --Vertebral arteries: Normal V4 segments. --Inferior cerebellar arteries: Normal. --Basilar artery: Normal. --Superior cerebellar arteries: Normal. --Posterior cerebral arteries (PCA): Normal. ANTERIOR CIRCULATION: --Intracranial internal  carotid arteries: Atherosclerotic calcification of the internal carotid arteries at the skull base without hemodynamically significant stenosis. --Anterior cerebral arteries (ACA): There is occlusion of the proximal right A2 segment (series 7, image 100). There is no left A1 segment, which is a common variant. --Middle cerebral arteries (MCA): Normal. VENOUS SINUSES: As permitted by contrast timing, patent. ANATOMIC VARIANTS: None Review of the MIP images confirms the above findings. CT Brain Perfusion Findings: The perfusion component of this scan is markedly degraded by motion and by an abnormal time-enhancement curve. No alive old diagnostic information from this component of the scan. IMPRESSION: 1. Large bilateral pulmonary arterial filling defects, consistent with acute pulmonary emboli. 2. 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 Critical Value/emergent results (item#1) were called by telephone at the time of interpretation on 06/29/2022 at 2:37 am to provider [facility] ,  who verbally acknowledged these results. Electronically Signed   By: Deatra Robinson M.D.   On: 06/29/2022 02:42   CT ANGIO HEAD NECK W WO CM W PERF (CODE STROKE)  Result Date: 06/29/2022 CLINICAL DATA:  Left-sided weakness EXAM: CT ANGIOGRAPHY HEAD AND NECK CT PERFUSION BRAIN TECHNIQUE: Multidetector CT imaging of the head and neck was performed using the standard protocol during bolus administration of intravenous contrast. Multiplanar CT image reconstructions and MIPs were obtained to evaluate the vascular anatomy. Carotid stenosis measurements (when applicable) are obtained utilizing NASCET criteria, using the distal internal carotid diameter as the denominator. Multiphase CT imaging of the brain was performed following IV bolus contrast injection. Subsequent parametric perfusion maps were calculated using RAPID software. RADIATION DOSE REDUCTION: This exam was performed according to the departmental dose-optimization program which includes automated exposure control, adjustment of the mA and/or kV according to patient size and/or use of iterative reconstruction technique. CONTRAST:  OMNIPAQUE IOHEXOL 350 MG/ML SOLN COMPARISON:  None Available. FINDINGS: CTA NECK FINDINGS SKELETON: There is no bony spinal canal stenosis. No lytic or blastic lesion. OTHER NECK: Normal pharynx, larynx and major salivary glands. No cervical lymphadenopathy. Unremarkable thyroid gland. UPPER CHEST: There are large bilateral pulmonary arterial filling defects. AORTIC ARCH: There is no calcific atherosclerosis of the aortic arch. There is no aneurysm, dissection or hemodynamically significant stenosis of the visualized portion of the aorta. Conventional 3 vessel aortic branching pattern. The visualized proximal subclavian arteries are widely patent. RIGHT CAROTID SYSTEM: Normal without aneurysm, dissection or stenosis. LEFT CAROTID SYSTEM: No dissection, occlusion or aneurysm. There is mixed density atherosclerosis extending  into the proximal ICA, resulting in less than 50% stenosis. VERTEBRAL ARTERIES: Codominant configuration. Both origins are clearly patent. There is no dissection, occlusion or flow-limiting stenosis to the skull base (V1-V3 segments). CTA HEAD FINDINGS POSTERIOR CIRCULATION: --Vertebral arteries: Normal V4 segments. --Inferior cerebellar arteries: Normal. --Basilar artery: Normal. --Superior cerebellar arteries: Normal. --Posterior cerebral arteries (PCA): Normal. ANTERIOR CIRCULATION: --Intracranial internal carotid arteries: Atherosclerotic calcification of the internal carotid arteries at the skull base without hemodynamically significant stenosis. --Anterior cerebral arteries (ACA): There is occlusion of the proximal right A2 segment (series 7, image 100). There is no left A1 segment, which is a common variant. --Middle cerebral arteries (MCA): Normal. VENOUS SINUSES: As permitted by contrast timing, patent. ANATOMIC VARIANTS: None Review of the MIP images confirms the above findings. CT Brain Perfusion Findings: The perfusion component of this scan is markedly degraded by motion and by an abnormal time-enhancement curve. No alive old diagnostic  information from this component of the scan. IMPRESSION: 1. Large bilateral pulmonary arterial filling defects, consistent with acute pulmonary emboli. 2. 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 Critical Value/emergent results (item#1) were called by telephone at the time of interpretation on 06/29/2022 at 2:37 am to provider [facility] , who verbally acknowledged these results. Electronically Signed   By: Deatra Robinson M.D.   On: 06/29/2022 02:42   CT HEAD CODE STROKE WO CONTRAST  Result Date: 06/29/2022 CLINICAL DATA:  Code stroke.  Left-sided weakness EXAM: CT HEAD WITHOUT CONTRAST TECHNIQUE: Contiguous axial images were obtained from the base of the skull  through the vertex without intravenous contrast. RADIATION DOSE REDUCTION: This exam was performed according to the departmental dose-optimization program which includes automated exposure control, adjustment of the mA and/or kV according to patient size and/or use of iterative reconstruction technique. 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 No acute hemorrhage. The size and configuration of the ventricles and extra-axial CSF spaces are normal. There is hypoattenuation of the periventricular white matter, most commonly indicating chronic ischemic microangiopathy. Vascular: Atherosclerotic calcification of the vertebral and internal carotid arteries at the skull base. No abnormal hyperdensity of the major intracranial arteries or dural venous sinuses. Skull: The visualized skull base, calvarium and extracranial soft tissues are normal. Sinuses/Orbits: No fluid levels or advanced mucosal thickening of the visualized paranasal sinuses. No mastoid or middle ear effusion. The orbits are normal. ASPECTS [facility] Stroke Program Early CT Score) - Ganglionic level infarction (caudate, lentiform nuclei, internal capsule, insula, M1-M3 cortex): 7 - Supraganglionic infarction (M4-M6 cortex): 3 Total score (0-10 with 10 being normal): 10 IMPRESSION: 1. No acute hemorrhage. 2. 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 3. ASPECTS is 10. These results were called by telephone at the time of interpretation on 06/29/2022 at 2:09 am to provider [facility] , who verbally acknowledged these results. Electronically Signed   By: Deatra Robinson M.D.   On: 06/29/2022 02:09    Microbiology: Results for orders placed or performed during the hospital encounter of 06/29/22  Culture, blood (Routine X 2) w Reflex to ID Panel     Status: None (Preliminary result)   Collection Time: 06/29/22  2:43 AM   Specimen: BLOOD  Result Value Ref Range Status   Specimen Description BLOOD  LEFT FOREARM   Final   Special Requests   Final    BOTTLES DRAWN AEROBIC AND ANAEROBIC Blood Culture adequate volume   Culture  Setup Time   Final    GRAM POSITIVE COCCI AEROBIC BOTTLE ONLY CRITICAL VALUE NOTED.  VALUE IS CONSISTENT WITH PREVIOUSLY REPORTED AND CALLED VALUE. Performed at [facility], [address] [place], [place] [address]    Culture [facility] POSITIVE COCCI  Final   Report Status PENDING  Incomplete  Culture, blood (Routine X 2) w Reflex to ID Panel     Status: None (Preliminary result)   Collection Time: 06/29/22  2:44 AM   Specimen: BLOOD  Result Value Ref Range Status   Specimen Description BLOOD  LEFT AC  Final   Special Requests   Final    BOTTLES DRAWN AEROBIC AND ANAEROBIC Blood Culture adequate volume   Culture  Setup Time   Final    Organism ID to follow GRAM POSITIVE COCCI IN BOTH AEROBIC AND ANAEROBIC BOTTLES CRITICAL RESULT CALLED TO, READ BACK BY AND VERIFIED WITH:  WALID NAZARI AT 2046 ON 06/29/22 BY SS Performed at [facility], [address] [place], [place] [address]    Culture [facility] POSITIVE COCCI  Final   Report Status PENDING  Incomplete  Blood Culture ID Panel (Reflexed)     Status: Abnormal   Collection Time: 06/29/22  2:44 AM  Result Value Ref Range Status   Enterococcus faecalis NOT DETECTED NOT DETECTED Final   Enterococcus Faecium NOT DETECTED NOT DETECTED Final   Listeria monocytogenes NOT DETECTED NOT DETECTED Final   Staphylococcus species DETECTED (A) NOT DETECTED Final    Comment: CRITICAL RESULT CALLED TO, READ BACK BY AND VERIFIED WITH: WALID NAZARI AT 2046 ON 06/29/22 BY SS    Staphylococcus aureus (BCID) NOT DETECTED NOT DETECTED Final   Staphylococcus epidermidis NOT DETECTED NOT DETECTED Final   Staphylococcus lugdunensis NOT DETECTED NOT DETECTED Final   Streptococcus species NOT DETECTED NOT DETECTED Final   Streptococcus agalactiae NOT DETECTED NOT DETECTED Final   Streptococcus pneumoniae NOT DETECTED NOT DETECTED  Final   Streptococcus pyogenes NOT DETECTED NOT DETECTED Final   A.calcoaceticus-baumannii NOT DETECTED NOT DETECTED Final   Bacteroides fragilis NOT DETECTED NOT DETECTED Final   Enterobacterales NOT DETECTED NOT DETECTED Final   Enterobacter cloacae complex NOT DETECTED NOT DETECTED Final   Escherichia coli NOT DETECTED NOT DETECTED Final   Klebsiella aerogenes NOT DETECTED NOT DETECTED Final   Klebsiella oxytoca NOT DETECTED NOT DETECTED Final   Klebsiella pneumoniae NOT DETECTED NOT DETECTED Final   Proteus species NOT DETECTED NOT DETECTED Final   Salmonella species NOT DETECTED NOT DETECTED Final   Serratia marcescens NOT DETECTED NOT DETECTED Final   Haemophilus influenzae NOT DETECTED NOT DETECTED Final   Neisseria meningitidis NOT DETECTED NOT DETECTED Final   Pseudomonas aeruginosa NOT DETECTED NOT DETECTED Final   Stenotrophomonas maltophilia NOT DETECTED NOT DETECTED Final   Candida albicans NOT DETECTED NOT DETECTED Final   Candida auris NOT DETECTED NOT DETECTED Final   Candida glabrata NOT DETECTED NOT DETECTED Final   Candida krusei NOT DETECTED NOT DETECTED Final   Candida parapsilosis NOT DETECTED NOT DETECTED Final   Candida tropicalis NOT DETECTED NOT DETECTED Final   Cryptococcus neoformans/gattii NOT DETECTED NOT DETECTED Final    Comment: Performed at [facility], [address] [place], [place] [address]  MRSA Next Gen by PCR, Nasal     Status: None   Collection Time: 06/29/22  6:10 AM   Specimen: Nasal Mucosa; Nasal Swab  Result Value Ref Range Status   MRSA by PCR Next Gen NOT DETECTED NOT DETECTED Final    Comment: (NOTE) The GeneXpert MRSA Assay (FDA approved for NASAL specimens only), is one component of a comprehensive MRSA colonization surveillance program. It is not intended to diagnose MRSA infection nor to guide or monitor treatment for MRSA infections. Test performance is not FDA approved in patients less than 40 years old. Performed  at [facility], [address] [place], [place] [address]     Labs: CBC: Recent Labs  Lab 06/29/22 0243 06/29/22 0446 06/30/22 0201  WBC 7.6 10.0 7.3  NEUTROABS 5.6  --   --   HGB 13.8 13.2 13.1  HCT 43.8 41.7 39.7  MCV 97.1 97.2 93.0  PLT 197 168 182   Basic Metabolic Panel: Recent Labs  Lab 06/29/22 0243 06/29/22 0446 06/29/22 0707 06/29/22 1129 06/29/22 1142  NA 132* 130* 135 134* 135  K 3.8 6.8* 3.4* 3.9 3.8  CL 97* 99  103 102 103  CO2 18* 18* 21* 23 24  GLUCOSE 444* 381* 245* 158* 154*  BUN 18 18 17 17 17   CREATININE 1.55* 1.40* 1.28* 1.05 1.12  CALCIUM 8.9 8.5* 8.9 8.8* 8.7*  MG  --  2.0  --   --   --   PHOS  --  4.2  --   --   --    Liver Function Tests: Recent Labs  Lab 06/29/22 0243  AST 73*  ALT 64*  ALKPHOS 89  BILITOT 1.3*  PROT 6.8  ALBUMIN 3.8   CBG: Recent Labs  Lab 06/29/22 2328 06/30/22 0741 06/30/22 0756 06/30/22 0932 06/30/22 1159  GLUCAP 265* 207* 195* 221* 264*    Discharge time spent: greater than 30 minutes.  Signed: Alford Highland, MD [facility] 06/30/2022

## 2022-06-30 NOTE — Hospital Course (Addendum)
57 y.o  male with significant PMH of Diabetes Mellitus, schizophrenia, Hypertension, CVA/TIA, polysubstance abuse who presented to the ED with chief complaints of left-sided weakness.   Per ED report, patient was found in a parking lot by friends who reported onset of symptoms at approximately 0110 with no reports of other associated symptoms.   ED Course: Initial vital signs showed HR of 114 beats/minute, BP 89/70 mm Hg, the RR 28 breaths/minute, and the oxygen saturation 97% on 2 L and a temperature of 100.49F (37.9C).  Finding as above concerning for acute stroke code stroke was activated at 03:15.  Stat CT head, CTA head and neck and CT perfusion study was obtained and patient evaluated by teleneurologist who deemed patient not a candidate for thrombolytics.   Pertinent Labs/Diagnostics Findings: Na+/ K+: 132/3.8 glucose: 444 BUN/Cr.:  18/1.55  AST/ALT: 73/64, beta hydroxybutyrate 3.09 WBC: Hgb/Hct: Plts:  PCT: pending Lactic acid: 2.9 COVID PCR: Negative, Troponin: 362 BNP: 205 UDS +Cocaine VBG:pO2 46; pCO2 43; pH 7.29;  HCO3 20.7, %O2 Sat 20.3.    CTA head and neck showed large bilateral pulmonary arterial filling defect, consistent with acute pulmonary embolism and occlusion of the proximal right A2 segment of the anterior cerebral artery.  Follow-up CTA chest was obtained which confirmed large volume of acute pulmonary emboli affecting all lobar branches with right heart strain.  Patient was started on heparin drip for PE protocol.  PCCM consulted for admission.  5/22.  Patient had a thrombectomy this morning for bilateral PE.  As soon as he got back from the thrombectomy he wanted to go home.  Patient had an IV infiltration of heparin on his right arm.  Patient also had positive blood cultures which could be skin contaminant secondary to staph species on admission.  Repeat blood cultures drawn.  Patient was placed on empiric vancomycin.  I explained to to the patient that would rather watch  his blood cultures another day or so to make sure ID treat infection.  Patient was switched over to Eliquis prior to signing out AGAINST MEDICAL ADVICE.  I spoke with the patient's mother and brother about him signing out against medical advise.  I would like to continue to watch him another day in the hospital at least.  I went back to the patient's room to try to convince him not to leave.  Risk of death explained.

## 2022-06-30 NOTE — Telephone Encounter (Signed)
Pharmacy Patient Advocate Encounter  Insurance verification completed.    The patient is insured through [place] [employer]   The patient is currently admitted and ran test claims for the following: Eliquis.  Copays and coinsurance results were relayed to Inpatient clinical team.   

## 2022-06-30 NOTE — Assessment & Plan Note (Signed)
Creatinine 1.55 on presentation and down to 1.12 upon signing out AGAINST MEDICAL ADVICE

## 2022-06-30 NOTE — Inpatient Diabetes Management (Addendum)
Inpatient Diabetes Program Recommendations  AACE/ADA: New Consensus Statement on Inpatient Glycemic Control (2015)  Target Ranges:  Prepandial:   less than 140 mg/dL      Peak postprandial:   less than 180 mg/dL (1-2 hours)      Critically ill patients:  140 - 180 mg/dL    Latest Reference Range & Units 06/29/22 04:46  Hemoglobin A1C 4.8 - 5.6 % 14.0 (H)  355 mg/dl  (H): Data is abnormally high  Latest Reference Range & Units 06/29/22 09:03 06/29/22 10:02 06/29/22 11:09 06/29/22 13:31 06/29/22 16:33 06/29/22 19:28 06/29/22 23:28  Glucose-Capillary 70 - 99 mg/dL 914 (H)  IV Insulin Drip Infusing 157 (H) 160 (H)  16 units Semglee @1257  84 148 (H) 218 (H) 265 (H)  (H): Data is abnormally high  Latest Reference Range & Units 06/30/22 07:41 06/30/22 07:56  Glucose-Capillary 70 - 99 mg/dL 782 (H) 956 (H)  (H): Data is abnormally high    Admit with:  Acute Multilobar Pulmonary embolism  ?Cocaine Induced thrombosis Elevated Troponin Left sided weakness  DKA Suspected Sepsis of unknown source   History: DM, Schizophrenia, CVA/ Polysubstance Abuse  Home DM Meds: Novolog 70/30 Insulin 12 units ac breakfast/ 11 units ac dinner (NOT taking)       Ozempic 0.5 mg Qweek (NOT taking)       Lantus 32 units Daily  Current Orders: Semglee 16 units Daily     MD- Please consider:  1. Start Novolog Moderate Correction Scale/ SSI (0-15 units) TID AC + HS  2. Increase Semglee slightly to 20 units Daily    Addendum 12:20pm--Met w/ pt at bedside to discuss home DM meds and current A1c of 14%.  Pt told me his last A1c was 13% (not sure when this was checked).  Told me he is NOT taking the 70/30 Insulin.  Instead he is taking Lantus 32 units BID--Admits to missing doses on a regular basis.  Has CBG meter at home but not checking often.  Told me his PCP at the [facility] (Dr. Marvis Moeller) recently gave pt Rx for Ozempic once weekly but pt told me he has not started it yet b/c he has not  gone to pick up the Ozempic.  We briefly talked about the importance of good CBG control to prevent both acute and long term complications.  Encouraged pt to set alarms on his phone to remind himself to take insulin and encouraged pt to check his CBGs at least BID at home and to go get his Ozempic so he can start at home.  Explained to pt that we are checking CBGs and giving Semglee insulin.  Told pt I have requested Novolog SSI for him as well and explained what Novolog SSI is and how we give it.  Pt had no questions for me regarding his diabetes at this time.  Sent Angels to Dr. Renae Gloss requesting Novolog SSI as CBG at 12pm was 264.      --Will follow patient during hospitalization--  Ambrose Finland RN, MSN, CDCES Diabetes Coordinator Inpatient Glycemic Control Team Team Pager: [phone] (8a-5p)

## 2022-06-30 NOTE — Op Note (Signed)
[facility]  Percutaneous Study/Intervention Procedural Note   Date of Surgery: 06/30/2022,5:29 PM  Surgeon:Vonzell Lindblad, Latina Craver   Pre-operative Diagnosis: Symptomatic pulmonary emboli with right heart strain and hypoxia  Post-operative diagnosis:  Same  Procedure(s) Performed:  1.  Contrast injection right heart and bilateral pulmonary arteries  2.  Thrombolysis bilateral pulmonary arteries with 10 mg of TPA  3.  Mechanical thrombectomy bilateral lobar pulmonary arteries for removal of pulmonary emboli using the Penumbra CAT 12 thrombectomy catheter.  4.  Selective catheter placement right upper lobe pulmonary artery, middle lobe pulmonary artery and lower lobe pulmonary artery  5.  Selective catheter placement left upper lobe pulmonary artery and lower lobe pulmonary artery    Anesthesia: Conscious sedation was administered under my direct supervision by the interventional radiology RN. IV Versed plus fentanyl were utilized. Continuous ECG, pulse oximetry and blood pressure was monitored throughout the entire procedure.  Versed and fentanyl were administered intravenously.  Conscious sedation was administered for a total of 56 minutes and 11 seconds.  Sheath: 12 French sheath antegrade right common femoral vein  Contrast: 85 cc   Fluoroscopy Time: 13.0 minutes  Indications:  Patient presents with pulmonary emboli. The patient is symptomatic with hypoxemia and dyspnea on exertion.  There is evidence of right heart strain on the CT angiogram. The patient is otherwise a good candidate for intervention and even the long-term benefits pulmonary angiography with thrombolysis is offered. The risks and benefits are reviewed long-term benefits are discussed. All questions are answered patient agrees to proceed.  Procedure:  Henry Brouse Stewartis a 57 y.o. male who was identified and appropriate procedural time out was performed.  The patient was then placed supine on the  table and prepped and draped in the usual sterile fashion.  Ultrasound was used to evaluate the right common femoral vein.  It was patent, as it was echolucent and compressible.  A digital ultrasound image was acquired for the permanent record.  A micropuncture needle was used to access the right common femoral vein under direct ultrasound guidance.  A microwire was then advanced under fluoroscopic guidance followed by micro-sheath.  A 0.035 J wire was advanced without resistance and a 5Fr sheath was placed.  Perclose devices were then used in a preclose fashion and then upsized to an 12 [place] sheath.    The wire and pigtail catheter were then negotiated into the right atrium and bolus injection of contrast was utilized to demonstrate the right ventricle and the pulmonary artery outflow.   4000 units of heparin was then given and allowed to circulate.  TPA was reconstituted and delivered onto the table. A total of 10 milligrams of TPA was utilized.  5 mg was administered on the left side and 5 mg was administered on the right side. This was then allowed to dwell for 20-30 minutes.  The J-wire and pigtail catheter was advanced up to the right atrium where a bolus injection contrast was used to demonstrate the pulmonary artery outflow.  Stiff angled Glidewire was then exchanged for the J-wire and the pigtail catheter was used to select the pulmonary outflow track.  The right main pulmonary artery was evaluated first.  A select catheter was then advanced over the Amplatz wire into the distal right main pulmonary artery and hand-injection confirmed the thrombus.  5 mg of tPA was then injected directly into the thrombus within the right distal main pulmonary artery.  After an appropriate dwell time the Amplatz wire was  reintroduced through the select catheter and the select catheter removed.  The Penumbra Cat 8 extra torque catheter was then advanced into the thrombus in the right lower lobe pulmonary artery.   Hand-injection contrast was used to verify positioning and evaluate the distal anatomy.  Mechanical aspiration was performed using the CAT 12 catheter and a separator.  After multiple passes the catheter was then repositioned into the right middle lobe pulmonary artery and again hand-injection contrast was performed to verify position and evaluate the distal anatomy.  Multiple passes were made using mechanical aspiration in association with a separator.  Lastly, using a combination of the separator catheter and Glidewire the CAT 12 device was negotiated into the right upper lobe pulmonary artery.  Hand-injection of contrast was used to verify positioning and evaluate the distal anatomy.  Multiple passes were then performed using the separator with the CAT 12 penumbra catheter.  Once there was free flow of blood from all 3 lobar arteries the catheter was repositioned to the right main pulmonary artery.  Hand-injection contrast was then used to give an assessment of the effectiveness of thrombectomy.  Satisfied with the thrombectomy on the right, I then used the penumbra CAT 12 device as well as a Glidewire and an angled catheter to select the right main pulmonary artery.  The catheter was then advanced into the left main pulmonary artery.  Then with the catheter in the left main pulmonary artery bolus injection contrast was utilized to demonstrate the thrombus as well as the segmental pulmonary artery vasculature. This demonstrated thrombus in the distal left main pulmonary artery extending into the left upper lobe artery as well as the left lower lobe artery and noting that it was near occlusive.  The catheter was then advanced out so that it was positioned within the thrombus and 5 milligrams of TPA were infused directly into the clot.  After an appropriate dwell time the Amplatz wire was reintroduced through the pigtail catheter and the pigtail catheter removed.  The Penumbra Cat 12 extra torque catheter was  then advanced into the thrombus in the left lower lobe pulmonary artery.  Hand-injection contrast was used to verify the positioning and evaluate the distal anatomy.  Mechanical aspiration was performed using the CAT 12 catheter and a separator.  After multiple passes the catheter was then repositioned into the left upper lobe pulmonary artery and again hand-injection contrast was performed to verify positioning and evaluate the distal anatomy.  Multiple passes were made using mechanical aspiration in association with a separator.  Once there was free flow of blood from both lobar arteries the catheter was repositioned to the left main pulmonary artery.  Hand-injection contrast was then performed to give an assessment of the left pulmonary vasculature and the effectiveness of thrombectomy.  The catheter was then reintroduced over the Amplatz wire after removing the penumbra catheter and positioned in the pulmonary outflow tract.  A bolus injection of contrast was then used to create a final image of the pulmonary vasculature.  After review these images the catheter and sheath were removed and pressure held. There were no immediate complications.    Findings:   Right heart imaging:  Right atrium and right ventricle and the pulmonary outflow tract appears normal  Right lung: The initial images of the right lung demonstrate thrombus within the distal right main pulmonary artery extending into the right upper, middle and lower lobar arteries.  There is thrombus extending into the segmental branches as well.  Following thrombectomy  there appears to be near total resolution of the previously identified thrombus.  Left lung:  The initial images of the left lung demonstrate thrombus within the distal left main pulmonary artery extending into the left upper and lower lobar arteries.  There is thrombus extending into the segmental branches as well.  Following thrombectomy there appears to be near total resolution  of the previously identified thrombus.    Disposition: Patient was taken to the recovery room in stable condition having tolerated the procedure well.  Henry Munoz 06/30/2022,5:29 PM

## 2022-06-30 NOTE — Progress Notes (Addendum)
Patient states he is leaving he is ready to go smoke. Writer advised patient that he has not been discharged and if he leaves it will be against medical advice. Explained to patient this could be very dangerous  and life threatening since he just had a procedure this morning . Patient states he does not care he is leaving. AMA  paperwork signed and all IV's removed.

## 2022-06-30 NOTE — Assessment & Plan Note (Signed)
Right arm secondary to heparin drip.  Called nursing staff then removed that IV and switched it over to the other arm this morning.

## 2022-06-30 NOTE — Interval H&P Note (Signed)
History and Physical Interval Note:  06/30/2022 9:54 AM  Henry Munoz  has presented today for surgery, with the diagnosis of Pulmonary embolism.  The various methods of treatment have been discussed with the patient and family. After consideration of risks, benefits and other options for treatment, the patient has consented to  Procedure(s): PULMONARY THROMBECTOMY (Bilateral) as a surgical intervention.  The patient's history has been reviewed, patient examined, no change in status, stable for surgery.  I have reviewed the patient's chart and labs.  Questions were answered to the patient's satisfaction.     [name]

## 2022-06-30 NOTE — Progress Notes (Signed)
ANTICOAGULATION CONSULT NOTE  Pharmacy Consult for Heparin  Indication: pulmonary embolus  Allergies  Allergen Reactions   Haldol [Haloperidol Lactate]     Patient Measurements: Height: 5\' 7"  (170.2 cm) Weight: 114.4 kg (252 lb 3.3 oz) IBW/kg (Calculated) : 66.1 Heparin Dosing Weight: 92.2 kg   Vital Signs: Temp: 98.2 F (36.8 C) (05/22 0201) Temp Source: Oral (05/22 0000) BP: 126/98 (05/22 0201) Pulse Rate: 87 (05/22 0201)  Labs: Recent Labs    06/29/22 0243 06/29/22 0446 06/29/22 0446 06/29/22 0707 06/29/22 1129 06/29/22 1142 06/29/22 2020 06/30/22 0201  HGB 13.8 13.2  --   --   --   --   --  13.1  HCT 43.8 41.7  --   --   --   --   --  39.7  PLT 197 168  --   --   --   --   --  182  APTT 28  --   --   --   --   --   --   --   LABPROT 16.2*  --   --   --   --   --   --   --   INR 1.3*  --   --   --   --   --   --   --   HEPARINUNFRC  --   --    < > 0.81* 0.74*  --  0.39 0.25*  CREATININE 1.55* 1.40*  --  1.28* 1.05 1.12  --   --   TROPONINIHS 362* 466*  --   --   --   --   --   --    < > = values in this interval not displayed.     Estimated Creatinine Clearance: 89 mL/min (by C-G formula based on SCr of 1.12 mg/dL).   Medical History: Past Medical History:  Diagnosis Date   Diabetes mellitus without complication (HCC)    Hypertension    Schizo-affective schizophrenia (HCC)    TIA (transient ischemic attack)     Medications:  Medications Prior to Admission  Medication Sig Dispense Refill Last Dose   clotrimazole (LOTRIMIN) 1 % cream Apply 1 Application topically 2 (two) times daily. 30 g 0 unknown   LANTUS SOLOSTAR 100 UNIT/ML Solostar Pen Inject 32 Units into the skin daily.   Past Week   aspirin EC 81 MG EC tablet Take 1 tablet (81 mg total) by mouth daily. (Patient not taking: Reported on 06/29/2022) 30 tablet 0 Not Taking   atorvastatin (LIPITOR) 40 MG tablet Take 1 tablet (40 mg total) by mouth daily. (Patient not taking: Reported on 06/29/2022)  30 tablet 0 Not Taking   clopidogrel (PLAVIX) 75 MG tablet Take 1 tablet (75 mg total) by mouth daily. (Patient not taking: Reported on 06/29/2022) 30 tablet 0 Not Taking   insulin aspart protamine- aspart (NOVOLOG MIX 70/30) (70-30) 100 UNIT/ML injection Inject 12 units under the skin daily with breakfast and 11 units daily with dinner (Patient not taking: Reported on 06/29/2022) 10 mL 1 Not Taking   Insulin Syringe-Needle U-100 (ULTICARE INSULIN SYRINGE) 31G X 1/4" 0.5 ML MISC use with novolog mix 70/30 60 each 0    OZEMPIC, 0.25 OR 0.5 MG/DOSE, 2 MG/3ML SOPN Inject 0.5 mg into the skin once a week. (Patient not taking: Reported on 06/29/2022)   Not Taking   risperiDONE (RISPERDAL) 1 MG tablet Take 1 tablet (1 mg total) by mouth at bedtime. (Patient not taking: Reported on  06/29/2022) 30 tablet 0 Not Taking   SEROQUEL 100 MG tablet Take 100 mg by mouth at bedtime. (Patient not taking: Reported on 06/29/2022)   Not Taking   sertraline (ZOLOFT) 50 MG tablet Take 1 tablet (50 mg total) by mouth daily. (Patient not taking: Reported on 06/29/2022) 30 tablet 0 Not Taking   traZODone (DESYREL) 150 MG tablet Take 1 tablet (150 mg total) by mouth at bedtime. (Patient not taking: Reported on 06/29/2022) 30 tablet 0 Not Taking    Assessment: Pharmacy consulted to dose heparin in this 57 year old male admitted with PE.  No prior anticoag noted.   Goal of Therapy:  Heparin level:  0.3 - 0.7 Monitor platelets by anticoagulation protocol: Yes   Plan: 5/22:  HL @ 0201 = 0.25, SUBtherapeutic - Will order heparin 1400 units IV X 1 bolus and increase drip rate to 1600 units/hr - Will recheck HL 6 hrs after rate change ---Continue to monitor H&H and platelets  Izaiha Lo D 06/30/2022,2:49 AM

## 2022-06-30 NOTE — Assessment & Plan Note (Signed)
Secondary to acute kidney injury, mild DKA.

## 2022-06-30 NOTE — Progress Notes (Signed)
Pharmacy Antibiotic Note  Henry Munoz is a 57 y.o. male admitted on 06/29/2022 with sepsis/bacteremia. Patient found to have blood cultures for staph species. Pharmacy has been consulted for Vancomycin dosing.  Plan: Continue Vancomycin 1750 mg IV Q 24 hrs. Goal AUC 400-550. Expected AUC: 489 SCr used: 1.12 Expected Cmax 39 Expected Cmin 10.8   Height: 5\' 7"  (170.2 cm) Weight: 116.3 kg (256 lb 6.3 oz) IBW/kg (Calculated) : 66.1  Temp (24hrs), Avg:98 F (36.7 C), Min:97.7 F (36.5 C), Max:98.4 F (36.9 C)  Recent Labs  Lab 06/29/22 0243 06/29/22 0446 06/29/22 0707 06/29/22 1129 06/29/22 1142 06/30/22 0201  WBC 7.6 10.0  --   --   --  7.3  CREATININE 1.55* 1.40* 1.28* 1.05 1.12  --   LATICACIDVEN  --  2.9* 2.4*  --   --   --      Estimated Creatinine Clearance: 89.8 mL/min (by C-G formula based on SCr of 1.12 mg/dL).    Allergies  Allergen Reactions   Haldol [Haloperidol Lactate]     Antimicrobials this admission: Vancomycin 5/21 >>    Dose adjustments this admission: n/a   Microbiology results: 5/21 BCx: GPC 5/21 MRSA PCR: negative   Thank you for allowing pharmacy to be a part of this patient's care.  Muskaan Smet Rodriguez-Guzman PharmD, BCPS 06/30/2022 12:53 PM

## 2022-06-30 NOTE — Assessment & Plan Note (Addendum)
Mild DKA on presentation.  Hemoglobin A1c elevated at 14.  Patient initially was on insulin drip and switched over to [name] insulin and will increase up to 20 units daily and short acting insulin prior to meals.

## 2022-06-30 NOTE — Assessment & Plan Note (Addendum)
Patient with massive pulmonary emboli with significant heart strain and hypoxia.  Patient was brought for thrombectomy today by Dr. Gilda Crease.  Right after procedure he felt well and wanted to go home.  I told him that I did not want to send him home today since he did not have a procedure.  Patient insisted on going and signed out AGAINST MEDICAL ADVICE.  Before signing out AGAINST MEDICAL ADVICE I switched his heparin drip over to Eliquis just in case.  I did prescribe 30 days of Eliquis just in case he leaves today.  I advised I do not give any pain medications for those people at sign out AGAINST MEDICAL ADVICE.  Would also recommend a bilateral lower extremity ultrasound as outpatient if he leaves today.

## 2022-06-30 NOTE — Assessment & Plan Note (Signed)
Advised not to use cocaine 

## 2022-07-01 ENCOUNTER — Encounter: Payer: Self-pay | Admitting: Vascular Surgery

## 2022-07-01 LAB — CULTURE, BLOOD (ROUTINE X 2)

## 2022-07-02 ENCOUNTER — Other Ambulatory Visit: Payer: Self-pay

## 2022-07-02 ENCOUNTER — Inpatient Hospital Stay: Payer: Medicaid Other

## 2022-07-02 ENCOUNTER — Inpatient Hospital Stay
Admission: EM | Admit: 2022-07-02 | Discharge: 2022-07-04 | DRG: 282 | Disposition: A | Discharge status: Home / Self Care | Payer: Medicaid Other | Attending: Internal Medicine | Admitting: Internal Medicine

## 2022-07-02 ENCOUNTER — Emergency Department: Payer: Medicaid Other

## 2022-07-02 DIAGNOSIS — Z833 Family history of diabetes mellitus: Secondary | ICD-10-CM | POA: Diagnosis not present

## 2022-07-02 DIAGNOSIS — I2609 Other pulmonary embolism with acute cor pulmonale: Secondary | ICD-10-CM | POA: Diagnosis not present

## 2022-07-02 DIAGNOSIS — Z91148 Patient's other noncompliance with medication regimen for other reason: Secondary | ICD-10-CM | POA: Diagnosis not present

## 2022-07-02 DIAGNOSIS — F17210 Nicotine dependence, cigarettes, uncomplicated: Secondary | ICD-10-CM | POA: Diagnosis present

## 2022-07-02 DIAGNOSIS — Z7901 Long term (current) use of anticoagulants: Secondary | ICD-10-CM | POA: Diagnosis not present

## 2022-07-02 DIAGNOSIS — F259 Schizoaffective disorder, unspecified: Secondary | ICD-10-CM | POA: Diagnosis present

## 2022-07-02 DIAGNOSIS — E1165 Type 2 diabetes mellitus with hyperglycemia: Secondary | ICD-10-CM | POA: Diagnosis present

## 2022-07-02 DIAGNOSIS — F1410 Cocaine abuse, uncomplicated: Secondary | ICD-10-CM | POA: Diagnosis present

## 2022-07-02 DIAGNOSIS — F1490 Cocaine use, unspecified, uncomplicated: Secondary | ICD-10-CM

## 2022-07-02 DIAGNOSIS — I2699 Other pulmonary embolism without acute cor pulmonale: Secondary | ICD-10-CM | POA: Diagnosis present

## 2022-07-02 DIAGNOSIS — F1020 Alcohol dependence, uncomplicated: Secondary | ICD-10-CM | POA: Diagnosis present

## 2022-07-02 DIAGNOSIS — Z8673 Personal history of transient ischemic attack (TIA), and cerebral infarction without residual deficits: Secondary | ICD-10-CM | POA: Diagnosis not present

## 2022-07-02 DIAGNOSIS — F1110 Opioid abuse, uncomplicated: Secondary | ICD-10-CM | POA: Diagnosis present

## 2022-07-02 DIAGNOSIS — I21A1 Myocardial infarction type 2: Principal | ICD-10-CM | POA: Diagnosis present

## 2022-07-02 DIAGNOSIS — Z794 Long term (current) use of insulin: Secondary | ICD-10-CM | POA: Diagnosis not present

## 2022-07-02 DIAGNOSIS — I10 Essential (primary) hypertension: Secondary | ICD-10-CM | POA: Diagnosis present

## 2022-07-02 DIAGNOSIS — Z888 Allergy status to other drugs, medicaments and biological substances status: Secondary | ICD-10-CM | POA: Diagnosis not present

## 2022-07-02 DIAGNOSIS — Z841 Family history of disorders of kidney and ureter: Secondary | ICD-10-CM

## 2022-07-02 DIAGNOSIS — R441 Visual hallucinations: Secondary | ICD-10-CM | POA: Diagnosis not present

## 2022-07-02 DIAGNOSIS — F209 Schizophrenia, unspecified: Secondary | ICD-10-CM | POA: Insufficient documentation

## 2022-07-02 DIAGNOSIS — Z5982 Transportation insecurity: Secondary | ICD-10-CM

## 2022-07-02 DIAGNOSIS — Z79899 Other long term (current) drug therapy: Secondary | ICD-10-CM | POA: Diagnosis not present

## 2022-07-02 DIAGNOSIS — E876 Hypokalemia: Secondary | ICD-10-CM | POA: Diagnosis not present

## 2022-07-02 DIAGNOSIS — I214 Non-ST elevation (NSTEMI) myocardial infarction: Secondary | ICD-10-CM

## 2022-07-02 DIAGNOSIS — I2510 Atherosclerotic heart disease of native coronary artery without angina pectoris: Secondary | ICD-10-CM | POA: Diagnosis present

## 2022-07-02 DIAGNOSIS — R079 Chest pain, unspecified: Secondary | ICD-10-CM

## 2022-07-02 HISTORY — DX: Alcohol abuse, uncomplicated: F10.10

## 2022-07-02 HISTORY — DX: Other pulmonary embolism without acute cor pulmonale: I26.99

## 2022-07-02 HISTORY — DX: Other ill-defined heart diseases: I51.89

## 2022-07-02 HISTORY — DX: Cerebral infarction, unspecified: I63.9

## 2022-07-02 HISTORY — DX: Cocaine abuse, uncomplicated: F14.10

## 2022-07-02 HISTORY — DX: Tobacco use: Z72.0

## 2022-07-02 LAB — PROTIME-INR
INR: 1.2 (ref 0.8–1.2)
Prothrombin Time: 15.2 seconds (ref 11.4–15.2)

## 2022-07-02 LAB — BASIC METABOLIC PANEL
Anion gap: 10 (ref 5–15)
BUN: 10 mg/dL (ref 6–20)
CO2: 25 mmol/L (ref 22–32)
Calcium: 9.1 mg/dL (ref 8.9–10.3)
Chloride: 103 mmol/L (ref 98–111)
Creatinine, Ser: 1.09 mg/dL (ref 0.61–1.24)
GFR, Estimated: >60 mL/min (ref >60)
Glucose, Bld: 194 mg/dL — ABNORMAL HIGH (ref 70–99)
Potassium: 3.5 mmol/L (ref 3.5–5.1)
Sodium: 138 mmol/L (ref 135–145)

## 2022-07-02 LAB — CULTURE, BLOOD (ROUTINE X 2)
Special Requests: ADEQUATE
Special Requests: ADEQUATE

## 2022-07-02 LAB — CBC
HCT: 36.1 % — ABNORMAL LOW (ref 39.0–52.0)
Hemoglobin: 11.8 g/dL — ABNORMAL LOW (ref 13.0–17.0)
MCH: 30.9 pg (ref 26.0–34.0)
MCHC: 32.7 g/dL (ref 30.0–36.0)
MCV: 94.5 fL (ref 80.0–100.0)
Platelets: 199 10*3/uL (ref 150–400)
RBC: 3.82 MIL/uL — ABNORMAL LOW (ref 4.22–5.81)
RDW: 13 % (ref 11.5–15.5)
WBC: 5.8 10*3/uL (ref 4.0–10.5)
nRBC: 0 % (ref 0.0–0.2)

## 2022-07-02 LAB — HEPARIN LEVEL (UNFRACTIONATED): Heparin Unfractionated: 0.11 IU/mL — ABNORMAL LOW (ref 0.30–0.70)

## 2022-07-02 LAB — TROPONIN I (HIGH SENSITIVITY)
Troponin I (High Sensitivity): 288 ng/L (ref <18)
Troponin I (High Sensitivity): 213 ng/L (ref <18)

## 2022-07-02 LAB — APTT: aPTT: 28 seconds (ref 24–36)

## 2022-07-02 LAB — GLUCOSE, CAPILLARY: Glucose-Capillary: 306 mg/dL — ABNORMAL HIGH (ref 70–99)

## 2022-07-02 MED ORDER — INSULIN ASPART 100 UNIT/ML IJ SOLN
0.0000 [IU] | Freq: Three times a day (TID) | INTRAMUSCULAR | Status: DC
  Administered 2022-07-03: 8 [IU] via SUBCUTANEOUS
  Administered 2022-07-03: 5 [IU] via SUBCUTANEOUS
  Administered 2022-07-03: 3 [IU] via SUBCUTANEOUS
  Administered 2022-07-04: 5 [IU] via SUBCUTANEOUS
  Filled 2022-07-02 (×4): qty 1

## 2022-07-02 MED ORDER — ONDANSETRON HCL 4 MG PO TABS: 4.0000 mg | ORAL_TABLET | Freq: Four times a day (QID) | ORAL | Status: DC | PRN

## 2022-07-02 MED ORDER — HEPARIN (PORCINE) 25000 UT/250ML-% IV SOLN
1500.0000 [IU]/h | INTRAVENOUS | Status: DC
  Administered 2022-07-02 – 2022-07-03 (×2): 1500 [IU]/h via INTRAVENOUS
  Filled 2022-07-02 (×2): qty 250

## 2022-07-02 MED ORDER — HEPARIN BOLUS VIA INFUSION
6000.0000 [IU] | Freq: Once | INTRAVENOUS | Status: AC
  Administered 2022-07-02: 6000 [IU] via INTRAVENOUS
  Filled 2022-07-02: qty 6000

## 2022-07-02 MED ORDER — IOHEXOL 350 MG/ML SOLN: 100.0000 mL | Freq: Once | INTRAVENOUS | Status: DC | PRN

## 2022-07-02 MED ORDER — ACETAMINOPHEN 325 MG PO TABS: 650.0000 mg | ORAL_TABLET | Freq: Four times a day (QID) | ORAL | Status: DC | PRN

## 2022-07-02 MED ORDER — ONDANSETRON HCL 4 MG/2ML IJ SOLN: 4.0000 mg | Freq: Four times a day (QID) | INTRAMUSCULAR | Status: DC | PRN

## 2022-07-02 MED ORDER — ACETAMINOPHEN 650 MG RE SUPP: 650.0000 mg | Freq: Four times a day (QID) | RECTAL | Status: DC | PRN

## 2022-07-02 MED ORDER — ALBUTEROL SULFATE (2.5 MG/3ML) 0.083% IN NEBU: 2.5000 mg | INHALATION_SOLUTION | RESPIRATORY_TRACT | Status: DC | PRN

## 2022-07-02 MED ORDER — INSULIN GLARGINE-YFGN 100 UNIT/ML ~~LOC~~ SOLN
20.0000 [IU] | Freq: Every day | SUBCUTANEOUS | Status: DC
  Administered 2022-07-03 – 2022-07-04 (×2): 20 [IU] via SUBCUTANEOUS
  Filled 2022-07-02 (×2): qty 0.2

## 2022-07-02 NOTE — ED Triage Notes (Signed)
Pt to ED [facility] for chest pain started today. States recently had PE and signed out AMA. Denies shob.

## 2022-07-02 NOTE — ED Notes (Signed)
First Nurse Note: Patient to ED via [facility] from home for chest pain that started around 1130 this AM. Patient was seen here on Tuesday for blood clot and stroke per patient but left AMA. Patient has not been taking blood thinners as prescribed. Given 324 of aspirin.  20 R hand

## 2022-07-02 NOTE — ED Provider Notes (Addendum)
[facility] Provider Note    Event Date/Time   First MD Initiated Contact with Patient 07/02/22 1600     (approximate)   History   Chest Pain   HPI  Henry Munoz is a 57 y.o. male   Past medical history of diabetes, hypertension, schizoaffective schizophrenia, TIA, recent PE status post thrombectomy who left AMA 5/22 and has not been taking his Eliquis who presents to the emergency department with recurrence of his midsternal chest pain associate with shortness of breath this morning.  He used cocaine last night.  He reports no fever, cough, abdominal pain, trauma.  He is willing to stay in the hospital for further treatment this time.  He states that he was given aspirin by EMS en route.  External Medical Documents Reviewed: Discharge summary from 06/30/2022 where he left AMA after thrombectomy for large PE, was heparinized at that time, had positive blood culture thought to be contaminant, was advised to take Eliquis when he AMA'd      Physical Exam   Triage Vital Signs: ED Triage Vitals  Enc Vitals Group     BP 07/02/22 1452 117/70     Pulse Rate 07/02/22 1452 82     Resp 07/02/22 1452 20     Temp 07/02/22 1452 98.5 F (36.9 C)     Temp src --      SpO2 07/02/22 1452 96 %     Weight 07/02/22 1453 205 lb (93 kg)     Height 07/02/22 1453 5\' 7"  (1.702 m)     Head Circumference --      Peak Flow --      Pain Score 07/02/22 1452 9     Pain Loc --      Pain Edu? --      Excl. in GC? --     Most recent vital signs: Vitals:   07/02/22 1452 07/02/22 1603  BP: 117/70 139/79  Pulse: 82 68  Resp: 20 17  Temp: 98.5 F (36.9 C)   SpO2: 96% 94%    General: Awake, no distress.  CV:  Good peripheral perfusion.  Resp:  Normal effort.  Abd:  No distention.  Other:  No acute distress, normal vital signs, no hypoxemia, no hypotension or tachycardia, no tenderness to palpation of the abdomen, lungs clear, has not bruising to the right arm,  moving all extremities.   ED Results / Procedures / Treatments   Labs (all labs ordered are listed, but only abnormal results are displayed) Labs Reviewed  BASIC METABOLIC PANEL - Abnormal; Notable for the following components:      Result Value   Glucose, Bld 194 (*)    All other components within normal limits  CBC - Abnormal; Notable for the following components:   RBC 3.82 (*)    Hemoglobin 11.8 (*)    HCT 36.1 (*)    All other components within normal limits  TROPONIN I (HIGH SENSITIVITY) - Abnormal; Notable for the following components:   Troponin I (High Sensitivity) 288 (*)    All other components within normal limits  CULTURE, BLOOD (ROUTINE X 2)  CULTURE, BLOOD (ROUTINE X 2)     I ordered and reviewed the above labs they are notable for troponin is 288 which is down from 400s upon admission earlier this week  EKG  ED ECG REPORT I, Pilar Jarvis, the attending physician, personally viewed and interpreted this ECG.   Date: 07/02/2022  EKG Time: 1448  Rate: 87  Rhythm: sinsus  Axis: nl  Intervals:lafb  ST&T Change: no stemi    RADIOLOGY I independently reviewed and interpreted chest x-ray and see no obvious focality or pneumothorax   PROCEDURES:  Critical Care performed: Yes, see critical care procedure note(s)  .Critical Care  Performed by: Pilar Jarvis, MD Authorized by: Pilar Jarvis, MD   Critical care provider statement:    Critical care time (minutes):  30   Critical care was time spent personally by me on the following activities:  Development of treatment plan with patient or surrogate, discussions with consultants, evaluation of patient's response to treatment, examination of patient, ordering and review of laboratory studies, ordering and review of radiographic studies, ordering and performing treatments and interventions, pulse oximetry, re-evaluation of patient's condition and review of old charts    MEDICATIONS ORDERED IN ED: Medications - No  data to display  External physician / consultants:  I spoke with hospitalist for admission and regarding care plan for this patient.   IMPRESSION / MDM / ASSESSMENT AND PLAN / ED COURSE  I reviewed the triage vital signs and the nursing notes.                                Patient's presentation is most consistent with acute presentation with potential threat to life or bodily function.  Differential diagnosis includes, but is not limited to, PE, ACS, cocaine induced ACS  The patient is on the cardiac monitor to evaluate for evidence of arrhythmia and/or significant heart rate changes.  MDM: This is a patient with known PE after thrombectomy was advised to stay in the hospital for further treatment but left AMA 2 days ago and has not been taking his anticoagulation here with shortness of breath and chest pain.  His troponin is elevated but there is no STEMI on EKG and the troponin is actually downtrending from his hospitalization earlier this week.  Will start on heparin for VTE/NSTEMI.  He already got aspirin.  He also used cocaine so this may have induced some of his chest pain as well.  He is willing to stay this time.  Will repeat blood cultures now given his positive blood cultures last time that were thought to be contaminant.  He has no fever I doubt active infection/bacteremia now will defer abx at this moment.        FINAL CLINICAL IMPRESSION(S) / ED DIAGNOSES   Final diagnoses:  Other pulmonary embolism without acute cor pulmonale, unspecified chronicity (HCC)  Nonspecific chest pain  NSTEMI (non-ST elevated myocardial infarction) (HCC)  Cocaine use     Rx / DC Orders   ED Discharge Orders     None        Note:  This document was prepared using Dragon voice recognition software and may include unintentional dictation errors.    Pilar Jarvis, MD 07/02/22 1616    Pilar Jarvis, MD 10/04/22 [phone]

## 2022-07-02 NOTE — ED Notes (Signed)
Critical Result: Troponin 288 Modesto Charon, MD made aware.

## 2022-07-02 NOTE — H&P (Addendum)
History and Physical    Loc Wrobleski NGE:[id number] DOB: [birth year] DOA: 07/02/2022  [facility]P: [facility]  Patient coming from: home  I have personally briefly reviewed patient's old medical records in [facility]  Chief Complaint: chest pain sob  HPI: Henry Munoz is a 57 y.o. male with medical history significant of DMII insulin dependent, HTN, Schizo-affective schizophrenia, CVA/TIA, polysubstance abuse who has interim history of diagnosis of  PE on 5/21 with right heart strain for which he underwent thrombectomy. Patient however signed out ama  on 5/22 prior to completing therapy. Patient was placed on Eliquis at discharge , however he has been non-compliant and has not taken Eliquis. He returns today- 2 days later with acute on set of chest pain that started around 11 am.. Patient also notes associated sob, as well as chills. He notes no fevers, no cough , no hemoptysis, no n/v/d/ or dysuria, no blood in stools or urine.    ED Course:  Vitals :afeb, bp 117/70, hr 82 , rr 20, sat 96%  Nsr LAFB unchanged from prior Wbc 5.8, hgb 11.8, mcv 94.5, plt 199  Ce 288, 213  ( prior admit peak in 400s) Cxr: NAD   Ctpe IMPRESSION: 1. Large volume of acute pulmonary emboli affecting all lobar branches with right heart strain. 2. Coronary atherosclerosis.    MRI 5/21/204 IMPRESSION: 1. No acute intracranial process. 2. Chronic right MCA territory infarct. Review of Systems: As per HPI otherwise 10 point review of systems negative.   Past Medical History:  Diagnosis Date   Diabetes mellitus without complication (HCC)    Hypertension    Schizo-affective schizophrenia (HCC)    TIA (transient ischemic attack)     Past Surgical History:  Procedure Laterality Date   PULMONARY THROMBECTOMY Bilateral 06/30/2022   Procedure: PULMONARY THROMBECTOMY;  Surgeon: Renford Dills, MD;  Location: [facility] INVASIVE CV LAB;  Service: Cardiovascular;  Laterality:  Bilateral;     reports that he has been smoking cigarettes. He has been smoking an average of .5 packs per day. He has never used smokeless tobacco. He reports current alcohol use of about 2.0 standard drinks of alcohol per week. He reports current drug use. Drug: Cocaine.  Allergies  Allergen Reactions   Haldol [Haloperidol Lactate]     No family history on file.  Prior to Admission medications   Medication Sig Start Date End Date Taking? Authorizing Provider  apixaban (ELIQUIS) 5 MG TABS tablet Two tabs po twice a day for one week then one tablet twice a day afterwards 06/30/22   Alford Highland, MD  Blood Glucose Monitoring Suppl DEVI 1 each by Does not apply route 3 (three) times daily. May dispense any manufacturer covered by patient's insurance. 06/30/22   Alford Highland, MD  clotrimazole (LOTRIMIN) 1 % cream Apply 1 Application topically 2 (two) times daily. 12/02/21   Minna Antis, MD  doxycycline (VIBRA-TABS) 100 MG tablet Take 1 tablet (100 mg total) by mouth every 12 (twelve) hours for 10 days. 06/30/22 07/10/22  Alford Highland, MD  Glucose Blood (BLOOD GLUCOSE TEST STRIPS) STRP 1 each by Does not apply route 3 (three) times daily. Use as directed to check blood sugar. May dispense any manufacturer covered by patient's insurance and fits patient's device. 06/30/22   Alford Highland, MD  insulin aspart (NOVOLOG) 100 UNIT/ML FlexPen If eating and Blood Glucose (BG) 80 or higher inject 4 units for meal coverage and add correction dose per scale. If not eating,  correction dose only. BG <150= 0 unit; BG 150-200= 1 unit; BG 201-250= 2 unit; BG 251-300= 3 unit; BG 301-350= 4 unit; BG 351-400= 5 unit; BG >400= 6 unit and Call Primary care. 06/30/22   Alford Highland, MD  insulin glargine (LANTUS) 100 UNIT/ML Solostar Pen Inject 20 Units into the skin daily. May substitute as needed per insurance. 06/30/22   Alford Highland, MD  Insulin Pen Needle (PEN NEEDLES) 31G X 5 MM MISC 1 each by  Does not apply route 3 (three) times daily. May dispense any manufacturer covered by patient's insurance. 06/30/22   Alford Highland, MD  Lancet Device MISC 1 each by Does not apply route 3 (three) times daily. May dispense any manufacturer covered by patient's insurance. 06/30/22   Alford Highland, MD  Lancets MISC 1 each by Does not apply route 3 (three) times daily. Use as directed to check blood sugar. May dispense any manufacturer covered by patient's insurance and fits patient's device. 06/30/22   Alford Highland, MD  polyethylene glycol (MIRALAX / GLYCOLAX) 17 g packet Take 17 g by mouth daily as needed for moderate constipation. 06/30/22   Alford Highland, MD    Physical Exam: Vitals:   07/02/22 1452 07/02/22 1453 07/02/22 1603  BP: 117/70  139/79  Pulse: 82  68  Resp: 20  17  Temp: 98.5 F (36.9 C)    SpO2: 96%  94%  Weight:  93 kg   Height:  5\' 7"  (1.702 m)     Constitutional: NAD, calm, comfortable Vitals:   07/02/22 1452 07/02/22 1453 07/02/22 1603  BP: 117/70  139/79  Pulse: 82  68  Resp: 20  17  Temp: 98.5 F (36.9 C)    SpO2: 96%  94%  Weight:  93 kg   Height:  5\' 7"  (1.702 m)    Eyes: PERRL, lids and conjunctivae normal ENMT: Mucous membranes are moist. Posterior pharynx clear of any exudate or lesions..  Neck: normal, supple, no masses, no thyromegaly Respiratory: clear to auscultation bilaterally, no wheezing, no crackles. Normal respiratory effort. No accessory muscle use.  Cardiovascular: Regular rate and rhythm, no murmurs / rubs / gallops. + extremity edema. 2+ pedal pulses. No .  Abdomen: no tenderness, no masses palpated. No hepatosplenomegaly. Bowel sounds positive.  Musculoskeletal: no clubbing / cyanosis. No joint deformity upper and lower extremities. Good ROM, no contractures. Normal muscle tone.  Skin: no rashes, lesions, ulcers. No induration/chronic venostasis changes b/l lower extremities Neurologic: CN 2-12 grossly intact. Sensation  intact,normal. Strength 5/5 in all 4.  Psychiatric: Normal judgment and insight. Alert and oriented x 3. Normal mood.    Labs on Admission: I have personally reviewed following labs and imaging studies  CBC: Recent Labs  Lab 06/29/22 0243 06/29/22 0446 06/30/22 0201 07/02/22 1455  WBC 7.6 10.0 7.3 5.8  NEUTROABS 5.6  --   --   --   HGB 13.8 13.2 13.1 11.8*  HCT 43.8 41.7 39.7 36.1*  MCV 97.1 97.2 93.0 94.5  PLT 197 168 182 199   Basic Metabolic Panel: Recent Labs  Lab 06/29/22 0446 06/29/22 0707 06/29/22 1129 06/29/22 1142 07/02/22 1455  NA 130* 135 134* 135 138  K 6.8* 3.4* 3.9 3.8 3.5  CL 99 103 102 103 103  CO2 18* 21* 23 24 25   GLUCOSE 381* 245* 158* 154* 194*  BUN 18 17 17 17 10   CREATININE 1.40* 1.28* 1.05 1.12 1.09  CALCIUM 8.5* 8.9 8.8* 8.7* 9.1  MG 2.0  --   --   --   --  PHOS 4.2  --   --   --   --    GFR: Estimated Creatinine Clearance: 82.3 mL/min (by C-G formula based on SCr of 1.09 mg/dL). Liver Function Tests: Recent Labs  Lab 06/29/22 0243  AST 73*  ALT 64*  ALKPHOS 89  BILITOT 1.3*  PROT 6.8  ALBUMIN 3.8   Recent Labs  Lab 06/29/22 0446  LIPASE 33   No results for input(s): "AMMONIA" in the last 168 hours. Coagulation Profile: Recent Labs  Lab 06/29/22 0243  INR 1.3*   Cardiac Enzymes: No results for input(s): "CKTOTAL", "CKMB", "CKMBINDEX", "TROPONINI" in the last 168 hours. BNP (last 3 results) No results for input(s): "PROBNP" in the last 8760 hours. HbA1C: No results for input(s): "HGBA1C" in the last 72 hours. CBG: Recent Labs  Lab 06/29/22 2328 06/30/22 0741 06/30/22 0756 06/30/22 0932 06/30/22 1159  GLUCAP 265* 207* 195* 221* 264*   Lipid Profile: No results for input(s): "CHOL", "HDL", "LDLCALC", "TRIG", "CHOLHDL", "LDLDIRECT" in the last 72 hours. Thyroid Function Tests: No results for input(s): "TSH", "T4TOTAL", "FREET4", "T3FREE", "THYROIDAB" in the last 72 hours. Anemia Panel: No results for input(s):  "VITAMINB12", "FOLATE", "FERRITIN", "TIBC", "IRON", "RETICCTPCT" in the last 72 hours. Urine analysis:    Component Value Date/Time   COLORURINE YELLOW (A) 06/29/2022 0306   APPEARANCEUR HAZY (A) 06/29/2022 0306   LABSPEC 1.039 (H) 06/29/2022 0306   PHURINE 5.0 06/29/2022 0306   GLUCOSEU >=500 (A) 06/29/2022 0306   HGBUR NEGATIVE 06/29/2022 0306   BILIRUBINUR NEGATIVE 06/29/2022 0306   KETONESUR 20 (A) 06/29/2022 0306   PROTEINUR 100 (A) 06/29/2022 0306   NITRITE NEGATIVE 06/29/2022 0306   LEUKOCYTESUR NEGATIVE 06/29/2022 0306    Radiological Exams on Admission: DG Chest 2 View  Result Date: 07/02/2022 CLINICAL DATA:  Chest pain EXAM: CHEST - 2 VIEW COMPARISON:  Radiograph 06/29/2022 FINDINGS: Unchanged cardiomediastinal silhouette. There is no focal airspace consolidation. There is no large effusion or evidence of pneumothorax. No acute osseous abnormality. IMPRESSION: No focal airspace disease. Electronically Signed   By: Caprice Renshaw M.D.   On: 07/02/2022 15:33    EKG: Independently reviewed.see above  Assessment/Plan  Pulmonary Embolism POA -dx 5/21 s/p thrombectomy  -returns with sob and cp in setting noncompliance with medication  -start heparin drip  -echo to evaluate heart strain as patient presents with chest pain /sob  -check bnp  - repeat CTPA evaluation of stability of clot  - pulmonary consult in am   NSTEMI type II  -due to PE  -continue cycle cyle  -f/u echo   Positive blood cultures  - repeat pending  -patient no fever, stable vitals   Uncontrolled DmII -resume lantus 20 untis  -iss/fs  -last A1c 14   HTN -currently stable of medications   Schizo-affective schizophrenia -patient states he is out of his medications  -will need to resume as able  -psych consult for assistance    CVA/TIA -patient with complaint of vague lower extremity weakness  - MRI negative on 5/21 for acute cva however noted out MCA cva  -    Polysubstance abuse -patient  noted last use 24 hours ago  -encourage cessation , sw referral on d/c     DVT prophylaxis: heparin Code Status: full/ as discussed per patient wishes in event of cardiac arrest  Family Communication: none at bedside Disposition Plan: patient  expected to be admitted greater than 2 midnights  Consults called: n/a Admission status: progressive care    Sara-Maiz A  Maisie Fus MD [facility]   If 7PM-7AM, please contact night-coverage [email or web address] Password [facility]  07/02/2022, 5:34 PM

## 2022-07-02 NOTE — Progress Notes (Signed)
ANTICOAGULATION CONSULT NOTE  Pharmacy Consult for Heparin  Indication: elevated troponins, h/o PE s/p recent thrombectomy  Allergies  Allergen Reactions   Haldol [Haloperidol Lactate]     Patient Measurements: Height: 5\' 7"  (170.2 cm) Weight: 93 kg (205 lb) IBW/kg (Calculated) : 66.1 Heparin Dosing Weight: 85.7 kg  Vital Signs: Temp: 98.5 F (36.9 C) (05/24 1452) BP: 139/79 (05/24 1603) Pulse Rate: 68 (05/24 1603)  Labs: Recent Labs    06/29/22 2020 06/30/22 0201 06/30/22 1350 07/02/22 1455  HGB  --  13.1  --  11.8*  HCT  --  39.7  --  36.1*  PLT  --  182  --  199  HEPARINUNFRC 0.39 0.25* 0.30  --   CREATININE  --   --   --  1.09  TROPONINIHS  --   --   --  288*     Estimated Creatinine Clearance: 82.3 mL/min (by C-G formula based on SCr of 1.09 mg/dL).   Medical History: Past Medical History:  Diagnosis Date   Diabetes mellitus without complication (HCC)    Hypertension    Schizo-affective schizophrenia (HCC)    TIA (transient ischemic attack)     Pertinent Medications:  Prescribed Apixaban 10mg  BID x 7 days followed by 5mg  BID, to start on 5/22, but never picked up medication   Assessment: Pharmacy consulted to dose heparin in this 57 year old male presenting the the ED with recurrence of midsternal chest pain associated with shortness of breath this morning.  Of note, discharge summary from 06/30/2022 where he left AMA after thrombectomy for large PE, was heparinized at that time, had positive blood culture thought to be contaminant, was advised to take Eliquis when he AMA'd. He apparently never started the medication and admitted to using cocaine last night.   Baseline labs have been ordered and are pending.  Goal of Therapy:  Heparin level:  0.3 - 0.7 Monitor platelets by anticoagulation protocol: Yes   Plan: - Heparin 6000 units x 1 bolus - Will start heparin infusion at 1500 units/hr - Check both aPTT and anti-Xa in 6 hours. Since patient took  a dose of Apixaban on 5/22 while admitted, there may be residual trace amounts in his system that will affect the anti-Xa level. - Will use aPTT to guide dosing until level correlate with anti-Xa, then will transition to anti-Xa dosing. - Continue to monitor H&H and platelets  Bettey Costa, PharmD Clinical Pharmacist 07/02/2022 5:15 PM

## 2022-07-03 ENCOUNTER — Inpatient Hospital Stay: Payer: Medicaid Other

## 2022-07-03 ENCOUNTER — Inpatient Hospital Stay
Admit: 2022-07-03 | Discharge: 2022-07-03 | Disposition: A | Discharge status: Home / Self Care | Payer: Medicaid Other | Attending: Internal Medicine | Admitting: Internal Medicine

## 2022-07-03 DIAGNOSIS — R441 Visual hallucinations: Secondary | ICD-10-CM

## 2022-07-03 DIAGNOSIS — I2609 Other pulmonary embolism with acute cor pulmonale: Secondary | ICD-10-CM | POA: Diagnosis not present

## 2022-07-03 DIAGNOSIS — F209 Schizophrenia, unspecified: Secondary | ICD-10-CM | POA: Insufficient documentation

## 2022-07-03 LAB — COMPREHENSIVE METABOLIC PANEL
ALT: 24 U/L (ref 0–44)
AST: 15 U/L (ref 15–41)
Albumin: 2.9 g/dL — ABNORMAL LOW (ref 3.5–5.0)
Alkaline Phosphatase: 60 U/L (ref 38–126)
Anion gap: 7 (ref 5–15)
BUN: 8 mg/dL (ref 6–20)
CO2: 26 mmol/L (ref 22–32)
Calcium: 8.3 mg/dL — ABNORMAL LOW (ref 8.9–10.3)
Chloride: 104 mmol/L (ref 98–111)
Creatinine, Ser: 0.83 mg/dL (ref 0.61–1.24)
GFR, Estimated: >60 mL/min (ref >60)
Glucose, Bld: 213 mg/dL — ABNORMAL HIGH (ref 70–99)
Potassium: 3.1 mmol/L — ABNORMAL LOW (ref 3.5–5.1)
Sodium: 137 mmol/L (ref 135–145)
Total Bilirubin: 0.5 mg/dL (ref 0.3–1.2)
Total Protein: 5.5 g/dL — ABNORMAL LOW (ref 6.5–8.1)

## 2022-07-03 LAB — CBC
HCT: 33.3 % — ABNORMAL LOW (ref 39.0–52.0)
Hemoglobin: 10.9 g/dL — ABNORMAL LOW (ref 13.0–17.0)
MCH: 30.9 pg (ref 26.0–34.0)
MCHC: 32.7 g/dL (ref 30.0–36.0)
MCV: 94.3 fL (ref 80.0–100.0)
Platelets: 186 10*3/uL (ref 150–400)
RBC: 3.53 MIL/uL — ABNORMAL LOW (ref 4.22–5.81)
RDW: 13.2 % (ref 11.5–15.5)
WBC: 3.9 10*3/uL — ABNORMAL LOW (ref 4.0–10.5)
nRBC: 0 % (ref 0.0–0.2)

## 2022-07-03 LAB — GLUCOSE, CAPILLARY
Glucose-Capillary: 180 mg/dL — ABNORMAL HIGH (ref 70–99)
Glucose-Capillary: 240 mg/dL — ABNORMAL HIGH (ref 70–99)
Glucose-Capillary: 254 mg/dL — ABNORMAL HIGH (ref 70–99)
Glucose-Capillary: 232 mg/dL — ABNORMAL HIGH (ref 70–99)
Glucose-Capillary: 194 mg/dL — ABNORMAL HIGH (ref 70–99)

## 2022-07-03 LAB — APTT: aPTT: 94 seconds — ABNORMAL HIGH (ref 24–36)

## 2022-07-03 LAB — CULTURE, BLOOD (ROUTINE X 2): Special Requests: ADEQUATE

## 2022-07-03 LAB — HEPARIN LEVEL (UNFRACTIONATED)
Heparin Unfractionated: 0.41 IU/mL (ref 0.30–0.70)
Heparin Unfractionated: 0.44 IU/mL (ref 0.30–0.70)

## 2022-07-03 LAB — MAGNESIUM: Magnesium: 1.9 mg/dL (ref 1.7–2.4)

## 2022-07-03 MED ORDER — APIXABAN 5 MG PO TABS
10.0000 mg | ORAL_TABLET | Freq: Two times a day (BID) | ORAL | Status: DC
  Administered 2022-07-03 – 2022-07-04 (×3): 10 mg via ORAL
  Filled 2022-07-03 (×3): qty 2

## 2022-07-03 MED ORDER — POTASSIUM CHLORIDE CRYS ER 20 MEQ PO TBCR
60.0000 meq | EXTENDED_RELEASE_TABLET | Freq: Once | ORAL | Status: AC
  Administered 2022-07-03: 60 meq via ORAL
  Filled 2022-07-03: qty 3

## 2022-07-03 MED ORDER — APIXABAN 5 MG PO TABS: 5.0000 mg | ORAL_TABLET | Freq: Two times a day (BID) | ORAL | Status: DC

## 2022-07-03 MED ORDER — INSULIN GLARGINE-YFGN 100 UNIT/ML ~~LOC~~ SOLN
10.0000 [IU] | Freq: Once | SUBCUTANEOUS | Status: AC
  Administered 2022-07-03: 10 [IU] via SUBCUTANEOUS
  Filled 2022-07-03: qty 0.1

## 2022-07-03 NOTE — Consult Note (Signed)
[facility] Face-to-Face Psychiatry Consult   Reason for Consult:  Visual Hallucinations reported and evaluate need for inpatient psychiatry. Referring Physician:  Kathrynn Running MD Patient Identification: Henry Munoz MRN:  [id number] Principal Diagnosis: <principal problem not specified> Diagnosis:  Active Problems:   Cocaine abuse (HCC)   Schizophrenia (HCC)   Total Time spent with patient: 15 minutes  Subjective:   Henry Munoz is a 57 y.o. male patient on inpatient unit evaluated for reports of visual hallucinations and the need for inpatient psychiatry. Patient reports "every time I close my eyes, I see snakes and dead people when I do crack".  HPI:  Henry Munoz is a 57 y.o. male on inpatient unit evaluated for reports of visual hallucinations and the need for inpatient psychiatry. He has a psychiatric history of MDD, schizophrenia and cocaine use disorder. Patient reports prior psychiatric hospitalization due to SI. Today, he denies SI/HI/paranoia/delusional thought or auditory hallucinations. He reports recent sobriety from crack cocaine but relapsed appox 4 weeks ago. He states that when he smokes crack he sees snakes and dead people. Patient reports desire to abstain but says its difficult. He states that being in prison helped him stop crack use before.  Patient denies recent psychotropic medication prescriptions or being established with an outpatient psychiatric provider at this time. He reports being previously established outpatient somewhere years ago where he was prescribed medication. Per patient's record, he was established with [facility] outpatient.   Patient is calm, pleasant and willing to engage. He is noted lying in the hospital bed and appears his stated age. He reports plans to discharge home to his mother's house today. Patient reports that he was on the inpatient unit here and treated psychiatrically in the past and wants to go back to inpatient psychiatry instead  of discharging today.   Past Psychiatric History: see above  Risk to Self:  denies Risk to Others:  denies Prior Inpatient Therapy: yes. see above Prior Outpatient Therapy:  yes. see above  Past Medical History:  Past Medical History:  Diagnosis Date   Diabetes mellitus without complication (HCC)    Hypertension    Schizo-affective schizophrenia (HCC)    TIA (transient ischemic attack)     Past Surgical History:  Procedure Laterality Date   PULMONARY THROMBECTOMY Bilateral 06/30/2022   Procedure: PULMONARY THROMBECTOMY;  Surgeon: Renford Dills, MD;  Location: [facility] INVASIVE CV LAB;  Service: Cardiovascular;  Laterality: Bilateral;   Family History: No family history on file. Family Psychiatric  History: None known Social History:  Social History   Substance and Sexual Activity  Alcohol Use Yes   Alcohol/week: 2.0 standard drinks of alcohol   Types: 2 Cans of beer per week   Comment: per week     Social History   Substance and Sexual Activity  Drug Use Yes   Types: Cocaine    Social History   Socioeconomic History   Marital status: Single    Spouse name: Not on file   Number of children: Not on file   Years of education: Not on file   Highest education level: Not on file  Occupational History   Not on file  Tobacco Use   Smoking status: Every Day    Packs/day: .5    Types: Cigarettes   Smokeless tobacco: Never  Substance and Sexual Activity   Alcohol use: Yes    Alcohol/week: 2.0 standard drinks of alcohol    Types: 2 Cans of beer per week  Comment: per week   Drug use: Yes    Types: Cocaine   Sexual activity: Not on file  Other Topics Concern   Not on file  Social History Narrative   Not on file   Social Determinants of Health   Financial Resource Strain: Low Risk  (10/13/2017)   Overall Financial Resource Strain (CARDIA)    Difficulty of Paying Living Expenses: Not hard at all  Food Insecurity: No Food Insecurity (10/13/2017)   Hunger Vital  Sign    Worried About Running Out of Food in the Last Year: Never true    Ran Out of Food in the Last Year: Never true  Transportation Needs: Unmet Transportation Needs (10/13/2017)   PRAPARE - Transportation    Lack of Transportation (Medical): Yes    Lack of Transportation (Non-Medical): Yes  Physical Activity: Inactive (10/13/2017)   Exercise Vital Sign    Days of Exercise per Week: 0 days    Minutes of Exercise per Session: 0 min  Stress: No Stress Concern Present (10/13/2017)   [employer] of Occupational Health - Occupational Stress Questionnaire    Feeling of Stress : Not at all  Social Connections: Somewhat Isolated (10/13/2017)   Social Connection and Isolation Panel [NHANES]    Frequency of Communication with Friends and Family: Three times a week    Frequency of Social Gatherings with Friends and Family: Never    Attends Religious Services: 1 to 4 times per year    Active Member of [employer] or Organizations: No    Attends Banker Meetings: Never    Marital Status: Separated   Additional Social History:    Allergies:   Allergies  Allergen Reactions   Haldol [Haloperidol Lactate]     Labs:  Results for orders placed or performed during the hospital encounter of 07/02/22 (from the past 48 hour(s))  Basic metabolic panel     Status: Abnormal   Collection Time: 07/02/22  2:55 PM  Result Value Ref Range   Sodium 138 135 - 145 mmol/L   Potassium 3.5 3.5 - 5.1 mmol/L   Chloride 103 98 - 111 mmol/L   CO2 25 22 - 32 mmol/L   Glucose, Bld 194 (H) 70 - 99 mg/dL    Comment: Glucose reference range applies only to samples taken after fasting for at least 8 hours.   BUN 10 6 - 20 mg/dL   Creatinine, Ser 8.29 0.61 - 1.24 mg/dL   Calcium 9.1 8.9 - 56.2 mg/dL   GFR, Estimated >13 >08 mL/min    Comment: (NOTE) Calculated using the CKD-EPI Creatinine Equation (2021)    Anion gap 10 5 - 15    Comment: Performed at [facility], [address]  [place], [place] [address]  CBC     Status: Abnormal   Collection Time: 07/02/22  2:55 PM  Result Value Ref Range   WBC 5.8 4.0 - 10.5 K/uL   RBC 3.82 (L) 4.22 - 5.81 MIL/uL   Hemoglobin 11.8 (L) 13.0 - 17.0 g/dL   HCT 69.6 (L) 29.5 - 28.4 %   MCV 94.5 80.0 - 100.0 fL   MCH 30.9 26.0 - 34.0 pg   MCHC 32.7 30.0 - 36.0 g/dL   RDW 13.2 44.0 - 10.2 %   Platelets 199 150 - 400 K/uL   nRBC 0.0 0.0 - 0.2 %    Comment: Performed at [facility], [address], [place], [place] [address]  Troponin I (High Sensitivity)  Status: Abnormal   Collection Time: 07/02/22  2:55 PM  Result Value Ref Range   Troponin I (High Sensitivity) 288 (HH) <18 ng/L    Comment: CRITICAL RESULT CALLED TO, READ BACK BY AND VERIFIED WITH LINDSEY BLACK @1540  07/02/22 MJU (NOTE) Elevated high sensitivity troponin I (hsTnI) values and significant  changes across serial measurements may suggest ACS but many other  chronic and acute conditions are known to elevate hsTnI results.  Refer to the "Links" section for chest pain algorithms and additional  guidance. Performed at [facility], [address] [place], [place] [address]   Culture, blood (routine x 2)     Status: None (Preliminary result)   Collection Time: 07/02/22  4:49 PM   Specimen: BLOOD  Result Value Ref Range   Specimen Description BLOOD BLOOD LEFT FOREARM    Special Requests      BOTTLES DRAWN AEROBIC AND ANAEROBIC Blood Culture results may not be optimal due to an inadequate volume of blood received in culture bottles   Culture      NO GROWTH < 24 HOURS Performed at [facility], [address], [place], [place] [address]    Report Status PENDING   Troponin I (High Sensitivity)     Status: Abnormal   Collection Time: 07/02/22  4:55 PM  Result Value Ref Range   Troponin I (High Sensitivity) 213 (HH) <18 ng/L    Comment: CRITICAL VALUE NOTED. VALUE IS CONSISTENT WITH PREVIOUSLY REPORTED/CALLED VALUE  [facility] (NOTE) Elevated high sensitivity troponin I (hsTnI) values and significant  changes across serial measurements may suggest ACS but many other  chronic and acute conditions are known to elevate hsTnI results.  Refer to the "Links" section for chest pain algorithms and additional  guidance. Performed at [facility], [address] [place], [place] [address]   Blood culture (routine x 2)     Status: None (Preliminary result)   Collection Time: 07/02/22  5:11 PM   Specimen: BLOOD  Result Value Ref Range   Specimen Description BLOOD RIGHT ANTECUBITAL    Special Requests      BOTTLES DRAWN AEROBIC AND ANAEROBIC Blood Culture adequate volume   Culture      NO GROWTH < 12 HOURS Performed at [facility], [address] [place], [place] [address]    Report Status PENDING   Culture, blood (routine x 2)     Status: None (Preliminary result)   Collection Time: 07/02/22  5:12 PM   Specimen: BLOOD  Result Value Ref Range   Specimen Description BLOOD BLOOD RIGHT ARM    Special Requests      BOTTLES DRAWN AEROBIC AND ANAEROBIC Blood Culture adequate volume   Culture      NO GROWTH < 24 HOURS Performed at [facility], [address] [place], [place] [address]    Report Status PENDING   APTT     Status: None   Collection Time: 07/02/22  5:12 PM  Result Value Ref Range   aPTT 28 24 - 36 seconds    Comment: Performed at [facility], [address] [place], [place] [address]  Protime-INR     Status: None   Collection Time: 07/02/22  5:12 PM  Result Value Ref Range   Prothrombin Time 15.2 11.4 - 15.2 seconds   INR 1.2 0.8 - 1.2    Comment: (NOTE) INR goal varies based on device and disease states. Performed at [facility], [address]  [place], [place] [address]   Heparin level (unfractionated)     Status: Abnormal   Collection Time: 07/02/22  5:12 PM  Result Value Ref Range   Heparin Unfractionated 0.11 (L) 0.30 - 0.70  IU/mL    Comment: (NOTE) The clinical reportable range upper limit is being lowered to >1.10 to align with the FDA approved guidance for the current laboratory assay.  If heparin results are below expected values, and patient dosage has  been confirmed, suggest follow up testing of antithrombin III levels. Performed at [facility], [address] [place], [place] [address]   Glucose, capillary     Status: Abnormal   Collection Time: 07/02/22  9:04 PM  Result Value Ref Range   Glucose-Capillary 306 (H) 70 - 99 mg/dL    Comment: Glucose reference range applies only to samples taken after fasting for at least 8 hours.  Heparin level (unfractionated)     Status: None   Collection Time: 07/03/22  1:15 AM  Result Value Ref Range   Heparin Unfractionated 0.41 0.30 - 0.70 IU/mL    Comment: (NOTE) The clinical reportable range upper limit is being lowered to >1.10 to align with the FDA approved guidance for the current laboratory assay.  If heparin results are below expected values, and patient dosage has  been confirmed, suggest follow up testing of antithrombin III levels. Performed at [facility], [address] [place], [place] [address]   APTT     Status: Abnormal   Collection Time: 07/03/22  1:15 AM  Result Value Ref Range   aPTT 94 (H) 24 - 36 seconds    Comment:        IF BASELINE aPTT IS ELEVATED, SUGGEST PATIENT RISK ASSESSMENT BE USED TO DETERMINE APPROPRIATE ANTICOAGULANT THERAPY. Performed at [facility], [address] [place], [place] [address]   Heparin level (unfractionated)     Status: None   Collection Time: 07/03/22  7:01 AM  Result Value Ref Range   Heparin Unfractionated 0.44 0.30 - 0.70 IU/mL    Comment: (NOTE) The clinical reportable range upper limit is being lowered to >1.10 to align with the FDA approved guidance for the current laboratory assay.  If heparin results are below expected values, and patient dosage  has  been confirmed, suggest follow up testing of antithrombin III levels. Performed at [facility], [address] [place], [place] [address]   CBC     Status: Abnormal   Collection Time: 07/03/22  7:01 AM  Result Value Ref Range   WBC 3.9 (L) 4.0 - 10.5 K/uL   RBC 3.53 (L) 4.22 - 5.81 MIL/uL   Hemoglobin 10.9 (L) 13.0 - 17.0 g/dL   HCT 13.0 (L) 86.5 - 78.4 %   MCV 94.3 80.0 - 100.0 fL   MCH 30.9 26.0 - 34.0 pg   MCHC 32.7 30.0 - 36.0 g/dL   RDW 69.6 29.5 - 28.4 %   Platelets 186 150 - 400 K/uL   nRBC 0.0 0.0 - 0.2 %    Comment: Performed at [facility], [address] [place], [place] [address]  Comprehensive metabolic panel     Status: Abnormal   Collection Time: 07/03/22  7:01 AM  Result Value Ref Range   Sodium 137 135 - 145 mmol/L   Potassium 3.1 (L) 3.5 - 5.1 mmol/L   Chloride 104 98 - 111 mmol/L   CO2 26 22 - 32 mmol/L   Glucose, Bld 213 (H) 70 -  99 mg/dL    Comment: Glucose reference range applies only to samples taken after fasting for at least 8 hours.   BUN 8 6 - 20 mg/dL   Creatinine, Ser 1.61 0.61 - 1.24 mg/dL   Calcium 8.3 (L) 8.9 - 10.3 mg/dL   Total Protein 5.5 (L) 6.5 - 8.1 g/dL   Albumin 2.9 (L) 3.5 - 5.0 g/dL   AST 15 15 - 41 U/L   ALT 24 0 - 44 U/L   Alkaline Phosphatase 60 38 - 126 U/L   Total Bilirubin 0.5 0.3 - 1.2 mg/dL   GFR, Estimated >09 >60 mL/min    Comment: (NOTE) Calculated using the CKD-EPI Creatinine Equation (2021)    Anion gap 7 5 - 15    Comment: Performed at [facility], [address] [place], [place] [address]  Magnesium     Status: None   Collection Time: 07/03/22  7:01 AM  Result Value Ref Range   Magnesium 1.9 1.7 - 2.4 mg/dL    Comment: Performed at [facility], [address] [place], [place] [address]  Glucose, capillary     Status: Abnormal   Collection Time: 07/03/22  8:33 AM  Result Value Ref Range   Glucose-Capillary 180 (H) 70 - 99 mg/dL    Comment: Glucose reference  range applies only to samples taken after fasting for at least 8 hours.  Glucose, capillary     Status: Abnormal   Collection Time: 07/03/22  9:19 AM  Result Value Ref Range   Glucose-Capillary 240 (H) 70 - 99 mg/dL    Comment: Glucose reference range applies only to samples taken after fasting for at least 8 hours.  Glucose, capillary     Status: Abnormal   Collection Time: 07/03/22 12:41 PM  Result Value Ref Range   Glucose-Capillary 254 (H) 70 - 99 mg/dL    Comment: Glucose reference range applies only to samples taken after fasting for at least 8 hours.    Current Facility-Administered Medications  Medication Dose Route Frequency Provider Last Rate Last Admin   acetaminophen (TYLENOL) tablet 650 mg  650 mg Oral Q6H PRN Lurline Del, MD       Or   acetaminophen (TYLENOL) suppository 650 mg  650 mg Rectal Q6H PRN Lurline Del, MD       albuterol (PROVENTIL) (2.5 MG/3ML) 0.083% nebulizer solution 2.5 mg  2.5 mg Nebulization Q2H PRN Lurline Del, MD       apixaban Everlene Balls) tablet 10 mg  10 mg Oral BID Tressie Ellis, RPH   10 mg at 07/03/22 1100   Followed by   Melene Muller ON 07/10/2022] apixaban (ELIQUIS) tablet 5 mg  5 mg Oral BID Tressie Ellis, RPH       insulin aspart (novoLOG) injection 0-15 Units  0-15 Units Subcutaneous TID WC Skip Mayer A, MD   8 Units at 07/03/22 1249   insulin glargine-yfgn (SEMGLEE) injection 20 Units  20 Units Subcutaneous QHS Skip Mayer A, MD       iohexol (OMNIPAQUE) 350 MG/ML injection 100 mL  100 mL Intravenous Once PRN Lurline Del, MD       ondansetron [facility]) tablet 4 mg  4 mg Oral Q6H PRN Lurline Del, MD       Or   ondansetron [facility]) injection 4 mg  4 mg Intravenous Q6H PRN Lurline Del, MD        Musculoskeletal: Strength & Muscle Tone: Per patient, left  sided weakness Gait & Station:  not assessed Patient leans:  N/A   Psychiatric Specialty Exam:  Presentation  General Appearance:  Appropriate for Environment  Eye Contact:Minimal  Speech:Clear and Coherent  Speech Volume:Normal  Handedness:No data recorded  Mood and Affect  Mood:Dysphoric  Affect:Congruent   Thought Process  Thought Processes:Coherent  Descriptions of Associations:Intact  Orientation:Full (Time, Place and Person)  Thought Content:Logical  History of Schizophrenia/Schizoaffective disorder:No data recorded Duration of Psychotic Symptoms:No data recorded Hallucinations:Hallucinations: Visual Description of Visual Hallucinations: "I see snakes and dead people when I do crack"  Ideas of Reference:No data recorded Suicidal Thoughts:Suicidal Thoughts: No  Homicidal Thoughts:Homicidal Thoughts: No   Sensorium  Memory:Immediate Good; Recent Fair; Remote Fair  Judgment:Good  Insight:Good   Executive Functions  Concentration:Good  Attention Span:Good  Recall:Good  Fund of Knowledge:Good  Language:Good   Psychomotor Activity  Psychomotor Activity:Psychomotor Activity: Normal   Assets  Assets:Communication Skills   Sleep  Sleep:No data recorded  Physical Exam: Physical Exam Vitals and nursing note reviewed.  Neurological:     Mental Status: He is alert and oriented to person, place, and time.  Psychiatric:        Mood and Affect: Mood normal.        Behavior: Behavior normal.    Review of Systems  Psychiatric/Behavioral:  Positive for hallucinations and substance abuse. Negative for suicidal ideas.   All other systems reviewed and are negative.  Blood pressure 139/77, pulse 68, temperature 97.9 F (36.6 C), resp. rate 18, height 5\' 7"  (1.702 m), weight 93 kg, SpO2 98 %. Body mass index is 32.11 kg/m.  Treatment Plan Summary: Patient is psychiatrically cleared for discharge when medically cleared. Patient would benefit from community resources due to dual diagnosis of schizophrenia and worsening symptoms due to crack cocaine use.  Assigned MD, RN, and SW  made aware of recommendations and disposition.  Disposition: No evidence of imminent risk to self or others at present.   Patient does not meet criteria for psychiatric inpatient admission. Supportive therapy provided about ongoing stressors.  Mcneil Sober, NP 07/03/2022 2:54 PM

## 2022-07-03 NOTE — Discharge Instructions (Signed)
                  Intensive Outpatient Programs  [place] [facility]    [facility] [address]     [address] [place],  [name]     [place], [place] [phone]      [phone]  [name]   [facility]  (Inpatient and outpatient)  [phone] (Suboxone and Methadone) [address]           [phone]           [facility]    [facility] - Intensive Outpatient [address]     [address] [place], [name] [address]     [name], [name]  [phone]      [phone]  [facility] (Outpatient, Inpatient, [facility]  [facility] (Groups and [facility]) (insurance only) [phone]    [place], [place]          [phone]       [facility]    [facility] (for caregivers and family) [address]     [address] [place], [place]     [place], [name] [phone]      [phone]  Residential Treatment Programs  [facility]  [facility](2 years) Residential: 90 days)  [facility] [address]      [address] [place], [name]     [place], [place] [phone]      [phone] or [phone]  [facility]    [facility] [address]      [address] [place], [name]     [place], [name] [phone]      [phone]  [facility]   [facility] ([facility] [address]     [address] [place], [name] [address]     [name], [name] [phone]      [phone] Admissions: 8am-3pm M-F  [facility]: Residential Program (90 Days)              [facility]: [facility]  [place], [name]     [place],   [phone] or [phone]    (Walk in Hours over the weekend or by referral)   [facility]: [facility]:[phone] (for crisis  response 24 hours a day) 

## 2022-07-03 NOTE — Consult Note (Signed)
  Arrived to complete evaluation. Patient leaving for [place]. Will return for consult.

## 2022-07-03 NOTE — Progress Notes (Addendum)
PROGRESS NOTE    Henry Munoz  ZOX:[id number] DOB: [birth year] DOA: 07/02/2022 PCP: [facility]     Brief Narrative:   From admission h and p   Henry Munoz is a 57 y.o. male with medical history significant of DMII insulin dependent, HTN, Schizo-affective schizophrenia, CVA/TIA, polysubstance abuse who has interim history of diagnosis of  PE on 5/21 with right heart strain for which he underwent thrombectomy. Patient however signed out ama  on 5/22 prior to completing therapy. Patient was placed on Eliquis at discharge , however he has been non-compliant and has not taken Eliquis. He returns today- 2 days later with acute on set of chest pain that started around 11 am.. Patient also notes associated sob, as well as chills. He notes no fevers, no cough , no hemoptysis, no n/v/d/ or dysuria, no blood in stools or urine.    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 S/p thrombolysis and mechanical thrombectomy with vascular on 5/22. Left AMA. Returns with chest pain that has resolved. Currently breathing comfortably and satting normally on room air, ambulated with RN without difficulty. PVL negative for DVT. - continue apixaban - outpt cancer screenings - outpt heme f/u  # Schizophrenia Patient says he is bothered by visual hallucinations. He is not on home meds. He thinks he may need to be hospitalized for this. - psychiatry consulted  # Staph hominis bacteremia In one blood culture from 5/21, f/u cultures negative, this is likely a contaminant - monitor cultures  #  Tropinemia Presenting trops in the 300s on 5/21, have improved to 200s. No wall motion abnormalities on 5/21 TTE. No overt ischemic changes on EKG. Does abuse cocaine. Coronary atherosclerosis seen on CT. Discussed case w/ Dr. Graciela Husbands of cardiology today, he advises formal cardiology consult but says that will need to wait until tomorrow - cardiology to see in the AM  # Cocaine abuse Obviously complicates care and contributes to non-compliance. Hiv neg.  - f/u hcv  # T2DM Uncontrolled, A1c 14.  - continue basal/bolus insulin, titrate as needed  # Hypokalemia # Hypomagnesemia - replete and monitor    DVT prophylaxis: apixaban Code Status: full Family Communication: none @ bedside  Level of care: Progressive Status is: Inpatient Remains inpatient appropriate because: need for psych eval    Consultants:  psychiatry  Procedures: none  Antimicrobials:  none    Subjective: Reports no chest pain or difficulty breathing. Concerned about visual hallucinations  Objective: Vitals:   07/02/22 2353 07/03/22 0433 07/03/22 0916 07/03/22 1343  BP: (!) 145/91 (!) 141/98 126/75 139/77  Pulse: 68 92 72 68  Resp: 18 15 18 18   Temp: 98.1 F (36.7 C) 98.5 F (36.9 C) 98.6 F (37 C) 97.9 F (36.6 C)  TempSrc:      SpO2: 97% 98% 97% 98%  Weight:      Height:        Intake/Output Summary (Last 24 hours) at 07/03/2022 1421 Last data filed at 07/03/2022 1420 Gross per 24 hour  Intake 667.26 ml  Output 1075 ml  Net -407.74 ml   [employer]  07/02/22 1453  Weight: 93 kg    Examination:  General exam: Appears calm and comfortable  Respiratory system: Clear to auscultation. Respiratory effort normal. Cardiovascular system: S1 & S2 heard, RRR. No JVD, murmurs, rubs, gallops or clicks. No pedal edema. Gastrointestinal system: Abdomen is nondistended, soft and nontender. No organomegaly or masses felt. Normal bowel sounds heard. Central nervous system: Alert and oriented. No  focal neurological deficits. Extremities: Symmetric 5 x 5 power. Left lower extremity swelling Skin: No rashes, lesions or ulcers Psychiatry: calm    Data Reviewed: I have personally reviewed following labs and imaging studies  CBC: Recent Labs  Lab 06/29/22 0243 06/29/22 0446 06/30/22 0201 07/02/22 1455 07/03/22 0701  WBC 7.6 10.0 7.3 5.8 3.9*  NEUTROABS 5.6  --   --   --   --   HGB 13.8 13.2 13.1 11.8* 10.9*  HCT 43.8 41.7 39.7 36.1* 33.3*  MCV 97.1 97.2 93.0 94.5 94.3  PLT 197 168 182 199 186   Basic Metabolic Panel: Recent Labs  Lab 06/29/22 0446 06/29/22 0707 06/29/22 1129 06/29/22 1142 07/02/22 1455 07/03/22 0701  NA 130* 135 134* 135 138 137  K 6.8* 3.4* 3.9 3.8 3.5 3.1*  CL 99 103 102 103 103 104  CO2 18* 21* 23 24 25 26   GLUCOSE 381* 245* 158* 154* 194* 213*  BUN 18 17 17 17 10 8   CREATININE 1.40* 1.28* 1.05 1.12 1.09 0.83  CALCIUM 8.5* 8.9 8.8* 8.7* 9.1 8.3*  MG 2.0  --   --   --   --  1.9  PHOS 4.2  --   --   --   --   --    GFR: Estimated Creatinine Clearance: 108.1 mL/min (by C-G formula based on SCr of 0.83 mg/dL). Liver Function Tests: Recent Labs  Lab 06/29/22 0243 07/03/22 0701  AST 73* 15  ALT 64* 24  ALKPHOS 89 60  BILITOT 1.3* 0.5  PROT 6.8 5.5*  ALBUMIN 3.8 2.9*   Recent Labs  Lab 06/29/22 0446  LIPASE 33   No results for input(s): "AMMONIA" in the last 168 hours. Coagulation Profile: Recent Labs  Lab 06/29/22 0243 07/02/22 1712  INR 1.3* 1.2   Cardiac Enzymes: No results for input(s): "CKTOTAL", "CKMB", "CKMBINDEX", "TROPONINI" in the last 168 hours. BNP (last 3 results) No results for input(s): "PROBNP" in the last 8760 hours. HbA1C: No results for input(s): "HGBA1C" in the last 72 hours. CBG: Recent Labs  Lab 06/30/22 1159 07/02/22 2104 07/03/22 0833 07/03/22 0919 07/03/22 1241  GLUCAP 264* 306* 180* 240* 254*   Lipid Profile: No results for input(s): "CHOL", "HDL", "LDLCALC", "TRIG", "CHOLHDL",  "LDLDIRECT" in the last 72 hours. Thyroid Function Tests: No results for input(s): "TSH", "T4TOTAL", "FREET4", "T3FREE", "THYROIDAB" in the last 72 hours. Anemia Panel: No results for input(s): "VITAMINB12", "FOLATE", "FERRITIN", "TIBC", "IRON", "RETICCTPCT" in the last 72 hours. Urine analysis:    Component Value Date/Time   COLORURINE YELLOW (A) 06/29/2022 0306   APPEARANCEUR HAZY (A) 06/29/2022 0306   LABSPEC 1.039 (H) 06/29/2022 0306   PHURINE 5.0 06/29/2022 0306   GLUCOSEU >=500 (A) 06/29/2022 0306   HGBUR NEGATIVE 06/29/2022 0306   BILIRUBINUR NEGATIVE 06/29/2022 0306   KETONESUR 20 (A) 06/29/2022 0306   PROTEINUR 100 (A) 06/29/2022 0306   NITRITE NEGATIVE 06/29/2022 0306   LEUKOCYTESUR NEGATIVE 06/29/2022 0306   Sepsis Labs: @LABRCNTIP (procalcitonin:4,lacticidven:4)  ) Recent Results (from the past 240 hour(s))  Culture, blood (Routine X 2) w Reflex to ID Panel  Status: Abnormal   Collection Time: 06/29/22  2:43 AM   Specimen: BLOOD  Result Value Ref Range Status   Specimen Description   Final    BLOOD  LEFT FOREARM Performed at [facility], [address] [place], [place] [address]    Special Requests   Final    BOTTLES DRAWN AEROBIC AND ANAEROBIC Blood Culture adequate volume Performed at [facility], [address] [place], [place] [address]    Culture  Setup Time   Final    GRAM POSITIVE COCCI AEROBIC BOTTLE ONLY CRITICAL VALUE NOTED.  VALUE IS CONSISTENT WITH PREVIOUSLY REPORTED AND CALLED VALUE. Performed at [facility], [address] [place], [place] [address]    Culture (A)  Final    STAPHYLOCOCCUS HOMINIS SUSCEPTIBILITIES PERFORMED ON PREVIOUS CULTURE WITHIN THE LAST 5 DAYS. Performed at [facility], [address], [place], [place] [address]    Report Status 07/02/2022 FINAL  Final  Culture, blood (Routine X 2) w Reflex to ID Panel     Status: Abnormal   Collection Time: 06/29/22  2:44 AM   Specimen:  BLOOD  Result Value Ref Range Status   Specimen Description   Final    BLOOD  LEFT [facility] Performed at [facility], [address] [place], [place] [address]    Special Requests   Final    BOTTLES DRAWN AEROBIC AND ANAEROBIC Blood Culture adequate volume Performed at [facility], [address] [place], [place] [address]    Culture  Setup Time   Final    GRAM POSITIVE COCCI IN BOTH AEROBIC AND ANAEROBIC BOTTLES CRITICAL RESULT CALLED TO, READ BACK BY AND VERIFIED WITH: WALID NAZARI AT 2046 ON 06/29/22 BY SS    Culture (A)  Final    STAPHYLOCOCCUS HOMINIS GRAM POSITIVE RODS FROM AEROBIC BOTTLE ATOPOBIUM VAGINAE Standardized susceptibility testing for this organism is not available. CRITICAL RESULT CALLED TO, READ BACK BY AND VERIFIED WITH: PHARMD S HALLAJI [id number] AT 758 AM BY CM Performed at [facility], [address] [place], [place] [address]    Report Status 07/02/2022 FINAL  Final   Organism ID, Bacteria STAPHYLOCOCCUS HOMINIS  Final      Susceptibility   Staphylococcus hominis - MIC*    CIPROFLOXACIN <=0.5 SENSITIVE Sensitive     ERYTHROMYCIN >=8 RESISTANT Resistant     GENTAMICIN <=0.5 SENSITIVE Sensitive     OXACILLIN RESISTANT Resistant     TETRACYCLINE <=1 SENSITIVE Sensitive     VANCOMYCIN <=0.5 SENSITIVE Sensitive     TRIMETH/SULFA 40 SENSITIVE Sensitive     CLINDAMYCIN RESISTANT Resistant     RIFAMPIN <=0.5 SENSITIVE Sensitive     Inducible Clindamycin POSITIVE Resistant     * STAPHYLOCOCCUS HOMINIS  Blood Culture ID Panel (Reflexed)     Status: Abnormal   Collection Time: 06/29/22  2:44 AM  Result Value Ref Range Status   Enterococcus faecalis NOT DETECTED NOT DETECTED Final   Enterococcus Faecium NOT DETECTED NOT DETECTED Final   Listeria monocytogenes NOT DETECTED NOT DETECTED Final   Staphylococcus species DETECTED (A) NOT DETECTED Final    Comment: CRITICAL RESULT CALLED TO, READ BACK BY AND VERIFIED WITH: WALID NAZARI AT  2046 ON 06/29/22 BY SS    Staphylococcus aureus (BCID) NOT DETECTED NOT DETECTED Final   Staphylococcus epidermidis NOT DETECTED NOT DETECTED Final   Staphylococcus lugdunensis NOT DETECTED NOT DETECTED Final   Streptococcus species NOT DETECTED NOT DETECTED Final  Streptococcus agalactiae NOT DETECTED NOT DETECTED Final   Streptococcus pneumoniae NOT DETECTED NOT DETECTED Final   Streptococcus pyogenes NOT DETECTED NOT DETECTED Final   A.calcoaceticus-baumannii NOT DETECTED NOT DETECTED Final   Bacteroides fragilis NOT DETECTED NOT DETECTED Final   Enterobacterales NOT DETECTED NOT DETECTED Final   Enterobacter cloacae complex NOT DETECTED NOT DETECTED Final   Escherichia coli NOT DETECTED NOT DETECTED Final   Klebsiella aerogenes NOT DETECTED NOT DETECTED Final   Klebsiella oxytoca NOT DETECTED NOT DETECTED Final   Klebsiella pneumoniae NOT DETECTED NOT DETECTED Final   Proteus species NOT DETECTED NOT DETECTED Final   Salmonella species NOT DETECTED NOT DETECTED Final   Serratia marcescens NOT DETECTED NOT DETECTED Final   Haemophilus influenzae NOT DETECTED NOT DETECTED Final   Neisseria meningitidis NOT DETECTED NOT DETECTED Final   Pseudomonas aeruginosa NOT DETECTED NOT DETECTED Final   Stenotrophomonas maltophilia NOT DETECTED NOT DETECTED Final   Candida albicans NOT DETECTED NOT DETECTED Final   Candida auris NOT DETECTED NOT DETECTED Final   Candida glabrata NOT DETECTED NOT DETECTED Final   Candida krusei NOT DETECTED NOT DETECTED Final   Candida parapsilosis NOT DETECTED NOT DETECTED Final   Candida tropicalis NOT DETECTED NOT DETECTED Final   Cryptococcus neoformans/gattii NOT DETECTED NOT DETECTED Final    Comment: Performed at [facility], [address] [place], [place] [address]  MRSA Next Gen by PCR, Nasal     Status: None   Collection Time: 06/29/22  6:10 AM   Specimen: Nasal Mucosa; Nasal Swab  Result Value Ref Range Status   MRSA by PCR Next Gen  NOT DETECTED NOT DETECTED Final    Comment: (NOTE) The GeneXpert MRSA Assay (FDA approved for NASAL specimens only), is one component of a comprehensive MRSA colonization surveillance program. It is not intended to diagnose MRSA infection nor to guide or monitor treatment for MRSA infections. Test performance is not FDA approved in patients less than 11 years old. Performed at [facility], [address] [place], [place] [address]   Culture, blood (Routine X 2) w Reflex to ID Panel     Status: None (Preliminary result)   Collection Time: 06/30/22  1:36 PM   Specimen: BLOOD  Result Value Ref Range Status   Specimen Description BLOOD BLOOD RIGHT HAND  Final   Special Requests   Final    BOTTLES DRAWN AEROBIC AND ANAEROBIC Blood Culture adequate volume   Culture   Final    NO GROWTH 3 DAYS Performed at [facility], [address], [place], [place] [address]    Report Status PENDING  Incomplete  Culture, blood (Routine X 2) w Reflex to ID Panel     Status: None (Preliminary result)   Collection Time: 06/30/22  1:51 PM   Specimen: BLOOD  Result Value Ref Range Status   Specimen Description BLOOD BLOOD LEFT HAND  Final   Special Requests   Final    BOTTLES DRAWN AEROBIC AND ANAEROBIC Blood Culture adequate volume   Culture   Final    NO GROWTH 3 DAYS Performed at [facility], [address], [place], [place] [address]    Report Status PENDING  Incomplete  Culture, blood (routine x 2)     Status: None (Preliminary result)   Collection Time: 07/02/22  4:49 PM   Specimen: BLOOD  Result Value Ref Range Status   Specimen Description BLOOD BLOOD LEFT FOREARM  Final   Special Requests   Final  BOTTLES DRAWN AEROBIC AND ANAEROBIC Blood Culture results may not be optimal due to an inadequate volume of blood received in culture bottles   Culture   Final    NO GROWTH < 24 HOURS Performed at [facility], [address] [place], [place]  [address]    Report Status PENDING  Incomplete  Blood culture (routine x 2)     Status: None (Preliminary result)   Collection Time: 07/02/22  5:11 PM   Specimen: BLOOD  Result Value Ref Range Status   Specimen Description BLOOD RIGHT ANTECUBITAL  Final   Special Requests   Final    BOTTLES DRAWN AEROBIC AND ANAEROBIC Blood Culture adequate volume   Culture   Final    NO GROWTH < 12 HOURS Performed at [facility], [address], [place], [place] [address]    Report Status PENDING  Incomplete  Culture, blood (routine x 2)     Status: None (Preliminary result)   Collection Time: 07/02/22  5:12 PM   Specimen: BLOOD  Result Value Ref Range Status   Specimen Description BLOOD BLOOD RIGHT ARM  Final   Special Requests   Final    BOTTLES DRAWN AEROBIC AND ANAEROBIC Blood Culture adequate volume   Culture   Final    NO GROWTH < 24 HOURS Performed at [facility], [address] [place], [place] [address]    Report Status PENDING  Incomplete         Radiology Studies: US Venous Img Lower Bilateral (DVT)  Result Date: 07/03/2022 CLINICAL DATA:  Recent bilateral pulmonary embolism and pulmonary thrombectomy. EXAM: BILATERAL LOWER EXTREMITY VENOUS DOPPLER ULTRASOUND TECHNIQUE: Gray-scale sonography with graded compression, as well as color Doppler and duplex ultrasound were performed to evaluate the lower extremity deep venous systems from the level of the common femoral vein and including the common femoral, femoral, profunda femoral, popliteal and calf veins including the posterior tibial, peroneal and gastrocnemius veins when visible. The superficial great saphenous vein was also interrogated. Spectral Doppler was utilized to evaluate flow at rest and with distal augmentation maneuvers in the common femoral, femoral and popliteal veins. COMPARISON:  None Available. FINDINGS: RIGHT LOWER EXTREMITY Common Femoral Vein: No evidence of thrombus. Normal compressibility,  respiratory phasicity and response to augmentation. Saphenofemoral Junction: No evidence of thrombus. Normal compressibility and flow on color Doppler imaging. Profunda Femoral Vein: No evidence of thrombus. Normal compressibility and flow on color Doppler imaging. Femoral Vein: No evidence of thrombus. Normal compressibility, respiratory phasicity and response to augmentation. Popliteal Vein: No evidence of thrombus. Normal compressibility, respiratory phasicity and response to augmentation. Calf Veins: No evidence of thrombus. Normal compressibility and flow on color Doppler imaging. Superficial Great Saphenous Vein: No evidence of thrombus. Normal compressibility. Venous Reflux:  None. Other Findings: No evidence of superficial thrombophlebitis or abnormal fluid collection. LEFT LOWER EXTREMITY Common Femoral Vein: No evidence of thrombus. Normal compressibility, respiratory phasicity and response to augmentation. Saphenofemoral Junction: No evidence of thrombus. Normal compressibility and flow on color Doppler imaging. Profunda Femoral Vein: No evidence of thrombus. Normal compressibility and flow on color Doppler imaging. Femoral Vein: No evidence of thrombus. Normal compressibility, respiratory phasicity and response to augmentation. Popliteal Vein: No evidence of thrombus. Normal compressibility, respiratory phasicity and response to augmentation. Calf Veins: No evidence of thrombus. Normal compressibility and flow on color Doppler imaging. Superficial Great Saphenous Vein: No evidence of thrombus. Normal compressibility. Venous Reflux:  None. Other Findings: No evidence of superficial thrombophlebitis or abnormal  fluid collection. IMPRESSION: No evidence of deep venous thrombosis in either lower extremity. Electronically Signed   By: Irish Lack M.D.   On: 07/03/2022 12:24   DG Chest 2 View  Result Date: 07/02/2022 CLINICAL DATA:  Chest pain EXAM: CHEST - 2 VIEW COMPARISON:  Radiograph 06/29/2022  FINDINGS: Unchanged cardiomediastinal silhouette. There is no focal airspace consolidation. There is no large effusion or evidence of pneumothorax. No acute osseous abnormality. IMPRESSION: No focal airspace disease. Electronically Signed   By: Caprice Renshaw M.D.   On: 07/02/2022 15:33        Scheduled Meds:  apixaban  10 mg Oral BID   Followed by   Melene Muller ON 07/10/2022] apixaban  5 mg Oral BID   insulin aspart  0-15 Units Subcutaneous TID WC   insulin glargine-yfgn  20 Units Subcutaneous QHS   Continuous Infusions:   LOS: 1 day     Silvano Bilis, MD [facility]   If 7PM-7AM, please contact night-coverage [email or web address] Password [email or web address] 07/03/2022, 2:21 PM

## 2022-07-03 NOTE — Progress Notes (Signed)
ANTICOAGULATION CONSULT NOTE  Pharmacy Consult for Apixaban Indication: elevated troponins, h/o PE s/p recent thrombectomy  Patient Measurements: Height: 5\' 7"  (170.2 cm) Weight: 93 kg (205 lb) IBW/kg (Calculated) : 66.1  Labs: Recent Labs    07/02/22 1455 07/02/22 1655 07/02/22 1712 07/03/22 0115 07/03/22 0701  HGB 11.8*  --   --   --  10.9*  HCT 36.1*  --   --   --  33.3*  PLT 199  --   --   --  186  APTT  --   --  28 94*  --   LABPROT  --   --  15.2  --   --   INR  --   --  1.2  --   --   HEPARINUNFRC  --   --  0.11* 0.41 0.44  CREATININE 1.09  --   --   --  0.83  TROPONINIHS 288* 213*  --   --   --     Estimated Creatinine Clearance: 108.1 mL/min (by C-G formula based on SCr of 0.83 mg/dL).  Medical History: Past Medical History:  Diagnosis Date   Diabetes mellitus without complication (HCC)    Hypertension    Schizo-affective schizophrenia (HCC)    TIA (transient ischemic attack)     Pertinent Medications:  Prescribed apixaban 10mg  BID x 7 days followed by 5mg  BID, to start on 5/22, but never picked up medication  Assessment: Pharmacy consulted to dose heparin in this 57 year old male presenting the the ED with recurrence of midsternal chest pain associated with shortness of breath this morning.  Of note, discharge summary from 06/30/2022 where he left AMA after thrombectomy for large PE, was heparinized at that time, had positive blood culture thought to be contaminant, was advised to take Eliquis when he AMA'd. He apparently never started the medication and admitted to using cocaine last night.    Plan: --Stop IV heparin --Apixaban 10 mg BID x 7 days followed by apixaban 5 mg BID for remaining duration of therapy Patient only received one dose of apixaban 10 mg during last admission prior to leaving AMA. Since he did not continue with therapy upon discharge, will consider this a new start apixaban --CBC per protocol  Tressie Ellis 07/03/2022 10:08 AM

## 2022-07-03 NOTE — TOC Initial Note (Signed)
Transition of Care [facility]) - Initial/Assessment Note    Patient Details  Name: Henry Munoz MRN: [id number] Date of Birth: 07/17/1965  [facility] CM/SW Contact:    Kemper Durie, RN Phone Number: 07/03/2022, 4:17 PM  Clinical Narrative:                  Patient is active with [facility], obtains [facility].  Depends on brother for transportation to appointments, uses cane for mobility.  Agrees to substance abuse resources, printed on AVS as well as hand out for [facility] services given to patient at bedside.   Expected Discharge Plan: Home/Self Care Barriers to Discharge: Continued Medical Work up   Patient Goals and CMS Choice Patient states their goals for this hospitalization and ongoing recovery are:: Home, outpatient SA          Expected Discharge Plan and Services                                              Prior Living Arrangements/Services   Lives with:: Self Patient language and need for interpreter reviewed:: Yes        Need for Family Participation in Patient Care: Yes (Comment) Care giver support system in place?: Yes (comment)   Criminal Activity/Legal Involvement Pertinent to Current Situation/Hospitalization: No - Comment as needed  Activities of Daily Living      Permission Sought/Granted                  Emotional Assessment Appearance:: Appears stated age Attitude/Demeanor/Rapport: Engaged Affect (typically observed): Accepting Orientation: : Oriented to Self, Oriented to Place, Oriented to  Time, Oriented to Situation   Psych Involvement: No (comment)  Admission diagnosis:  PE (pulmonary thromboembolism) (HCC) [I26.99] NSTEMI (non-ST elevated myocardial infarction) (HCC) [I21.4] Cocaine use [F14.90] Nonspecific chest pain [R07.9] Other pulmonary embolism without acute cor pulmonale, unspecified chronicity (HCC) [I26.99] Patient Active Problem List   Diagnosis Date Noted    Schizophrenia (HCC) 07/03/2022   PE (pulmonary thromboembolism) (HCC) 07/02/2022   Positive blood culture 06/30/2022   Cocaine abuse (HCC) 06/30/2022   Uncontrolled type 2 diabetes mellitus with hyperglycemia, with long-term current use of insulin (HCC) 06/30/2022   Pseudohyponatremia 06/30/2022   AKI (acute kidney injury) (HCC) 06/30/2022   Hyperkalemia 06/30/2022   IV infiltration, initial encounter 06/30/2022   Pulmonary embolus (HCC) 06/29/2022   Neuropathy, arm, left 10/15/2017   Acute encephalopathy 10/10/2017   Opiate abuse, episodic (HCC) 10/10/2017   MDD (major depressive disorder) 08/12/2016   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 [address] [place] [address] Phone: [phone] Fax: [phone]  [id number] [place] [place] - [address] [place] [address] Phone: [phone] Fax: [phone]     Social Determinants of Health (SDOH) Social History: SDOH Screenings   Food Insecurity: No Food Insecurity (10/13/2017)  Transportation Needs: Unmet Transportation Needs (10/13/2017)  Alcohol Screen: Low Risk  (01/09/2017)  Financial Resource Strain: Low Risk  (  10/13/2017)  Physical Activity: Inactive (10/13/2017)  Social Connections: Somewhat Isolated (10/13/2017)  Stress: No Stress Concern Present (10/13/2017)  Tobacco Use: High Risk (07/01/2022)   SDOH Interventions:     Readmission Risk Interventions     No data to display

## 2022-07-03 NOTE — Progress Notes (Signed)
ANTICOAGULATION CONSULT NOTE  Pharmacy Consult for Heparin  Indication: elevated troponins, h/o PE s/p recent thrombectomy  Allergies  Allergen Reactions   Haldol [Haloperidol Lactate]     Patient Measurements: Height: 5\' 7"  (170.2 cm) Weight: 93 kg (205 lb) IBW/kg (Calculated) : 66.1 Heparin Dosing Weight: 85.7 kg  Vital Signs: Temp: 98.1 F (36.7 C) (05/24 2353) Temp Source: Oral (05/24 1858) BP: 145/91 (05/24 2353) Pulse Rate: 68 (05/24 2353)  Labs: Recent Labs    06/30/22 1350 07/02/22 1455 07/02/22 1655 07/02/22 1712 07/03/22 0115  HGB  --  11.8*  --   --   --   HCT  --  36.1*  --   --   --   PLT  --  199  --   --   --   APTT  --   --   --  28 94*  LABPROT  --   --   --  15.2  --   INR  --   --   --  1.2  --   HEPARINUNFRC 0.30  --   --  0.11* 0.41  CREATININE  --  1.09  --   --   --   TROPONINIHS  --  288* 213*  --   --      Estimated Creatinine Clearance: 82.3 mL/min (by C-G formula based on SCr of 1.09 mg/dL).   Medical History: Past Medical History:  Diagnosis Date   Diabetes mellitus without complication (HCC)    Hypertension    Schizo-affective schizophrenia (HCC)    TIA (transient ischemic attack)     Pertinent Medications:  Prescribed Apixaban 10mg  BID x 7 days followed by 5mg  BID, to start on 5/22, but never picked up medication   Assessment: Pharmacy consulted to dose heparin in this 57 year old male presenting the the ED with recurrence of midsternal chest pain associated with shortness of breath this morning.  Of note, discharge summary from 06/30/2022 where he left AMA after thrombectomy for large PE, was heparinized at that time, had positive blood culture thought to be contaminant, was advised to take Eliquis when he AMA'd. He apparently never started the medication and admitted to using cocaine last night.   Baseline labs have been ordered and are pending.  Goal of Therapy:  Heparin level:  0.3 - 0.7 Monitor platelets by  anticoagulation protocol: Yes   Plan: 5/25 @ 0115:  aPTT = 94,  HL = 0.41 - aPTT therapeutic X 1 , now correlating with HL  - will use HL to guide dosing from here on - will continue current rate and recheck HL in 6 hrs on 5/25 @ 0700 - Continue to monitor H&H and platelets  Lyanne Kates D, PharmD Clinical Pharmacist 07/03/2022 2:02 AM

## 2022-07-03 NOTE — Progress Notes (Signed)
ANTICOAGULATION CONSULT NOTE  Pharmacy Consult for Heparin  Indication: elevated troponins, h/o PE s/p recent thrombectomy  Allergies  Allergen Reactions   Haldol [Haloperidol Lactate]     Patient Measurements: Height: 5\' 7"  (170.2 cm) Weight: 93 kg (205 lb) IBW/kg (Calculated) : 66.1 Heparin Dosing Weight: 85.7 kg  Vital Signs: Temp: 98.5 F (36.9 C) (05/25 0433) BP: 141/98 (05/25 0433) Pulse Rate: 92 (05/25 0433)  Labs: Recent Labs    07/02/22 1455 07/02/22 1655 07/02/22 1712 07/03/22 0115 07/03/22 0701  HGB 11.8*  --   --   --  10.9*  HCT 36.1*  --   --   --  33.3*  PLT 199  --   --   --  186  APTT  --   --  28 94*  --   LABPROT  --   --  15.2  --   --   INR  --   --  1.2  --   --   HEPARINUNFRC  --   --  0.11* 0.41 0.44  CREATININE 1.09  --   --   --  0.83  TROPONINIHS 288* 213*  --   --   --      Estimated Creatinine Clearance: 108.1 mL/min (by C-G formula based on SCr of 0.83 mg/dL).   Medical History: Past Medical History:  Diagnosis Date   Diabetes mellitus without complication (HCC)    Hypertension    Schizo-affective schizophrenia (HCC)    TIA (transient ischemic attack)     Pertinent Medications:  Prescribed Apixaban 10mg  BID x 7 days followed by 5mg  BID, to start on 5/22, but never picked up medication   Assessment: Pharmacy consulted to dose heparin in this 57 year old male presenting the the ED with recurrence of midsternal chest pain associated with shortness of breath this morning.  Of note, discharge summary from 06/30/2022 where he left AMA after thrombectomy for large PE, was heparinized at that time, had positive blood culture thought to be contaminant, was advised to take Eliquis when he AMA'd. He apparently never started the medication and admitted to using cocaine last night.   5/25 0115 HL  0.41  5/25 0701 HL 0.44  Goal of Therapy:  Heparin level:  0.3 - 0.7 Monitor platelets by anticoagulation protocol: Yes   Plan: Heparin  level is therapeutic. Will continue heparin infusion at 1500 units/hr. Recheck heparin and CBC with AM labs.   Ronnald Ramp, PharmD Clinical Pharmacist 07/03/2022 7:54 AM

## 2022-07-04 ENCOUNTER — Encounter: Payer: Self-pay | Admitting: Internal Medicine

## 2022-07-04 DIAGNOSIS — F209 Schizophrenia, unspecified: Secondary | ICD-10-CM

## 2022-07-04 DIAGNOSIS — Z794 Long term (current) use of insulin: Secondary | ICD-10-CM

## 2022-07-04 DIAGNOSIS — I2694 Multiple subsegmental pulmonary emboli without acute cor pulmonale: Secondary | ICD-10-CM

## 2022-07-04 DIAGNOSIS — F1410 Cocaine abuse, uncomplicated: Secondary | ICD-10-CM | POA: Diagnosis not present

## 2022-07-04 DIAGNOSIS — F1490 Cocaine use, unspecified, uncomplicated: Secondary | ICD-10-CM

## 2022-07-04 DIAGNOSIS — E1165 Type 2 diabetes mellitus with hyperglycemia: Secondary | ICD-10-CM | POA: Diagnosis not present

## 2022-07-04 DIAGNOSIS — I2699 Other pulmonary embolism without acute cor pulmonale: Secondary | ICD-10-CM | POA: Diagnosis not present

## 2022-07-04 LAB — CULTURE, BLOOD (ROUTINE X 2)
Culture: NO GROWTH
Special Requests: ADEQUATE

## 2022-07-04 LAB — BASIC METABOLIC PANEL
Anion gap: 5 (ref 5–15)
BUN: 12 mg/dL (ref 6–20)
CO2: 27 mmol/L (ref 22–32)
Calcium: 8.4 mg/dL — ABNORMAL LOW (ref 8.9–10.3)
Chloride: 108 mmol/L (ref 98–111)
Creatinine, Ser: 0.8 mg/dL (ref 0.61–1.24)
GFR, Estimated: >60 mL/min (ref >60)
Glucose, Bld: 165 mg/dL — ABNORMAL HIGH (ref 70–99)
Potassium: 3.5 mmol/L (ref 3.5–5.1)
Sodium: 140 mmol/L (ref 135–145)

## 2022-07-04 LAB — GLUCOSE, CAPILLARY: Glucose-Capillary: 209 mg/dL — ABNORMAL HIGH (ref 70–99)

## 2022-07-04 MED ORDER — APIXABAN 5 MG PO TABS: 10.0000 mg | ORAL_TABLET | Freq: Two times a day (BID) | ORAL | 3 refills | Status: DC

## 2022-07-04 MED ORDER — ATORVASTATIN CALCIUM 40 MG PO TABS: 40.0000 mg | ORAL_TABLET | Freq: Every day | ORAL | 3 refills | Status: DC

## 2022-07-04 MED ORDER — AMLODIPINE BESYLATE 5 MG PO TABS: 5.0000 mg | ORAL_TABLET | Freq: Every day | ORAL | Status: DC

## 2022-07-04 MED ORDER — AMLODIPINE BESYLATE 2.5 MG PO TABS: 2.5000 mg | ORAL_TABLET | Freq: Every day | ORAL | 3 refills | Status: DC

## 2022-07-04 MED ORDER — AMLODIPINE BESYLATE 5 MG PO TABS: 2.5000 mg | ORAL_TABLET | Freq: Every day | ORAL | Status: DC

## 2022-07-04 MED ORDER — ATORVASTATIN CALCIUM 20 MG PO TABS: 40.0000 mg | ORAL_TABLET | Freq: Every day | ORAL | Status: DC

## 2022-07-04 NOTE — Hospital Course (Signed)
[id number]  [phone]  [id number]  213 

## 2022-07-04 NOTE — Progress Notes (Signed)
Taxi voucher given 

## 2022-07-04 NOTE — Consult Note (Signed)
Cardiology Consult    Patient ID: Suvir Goertz MRN: [id number], DOB/AGE: 1965-10-16   Admit date: 07/02/2022 Date of Consult: 07/04/2022  Primary Physician: Center, [facility] Primary Cardiologist: None - new Requesting Provider: Alfonso Patten, MD  Patient Profile    Henry Munoz is a 57 y.o. male with a history of polysubstance abuse, HTN, DM, stroke/TIA, and schizo-affective disorder, who is being seen today for the evaluation of elevated troponin in the setting of acute PE w/ R heart strain at the request of Dr. Ashok Pall.  Past Medical History   Past Medical History:  Diagnosis Date   Alcohol abuse    Cocaine abuse (HCC)    Diabetes mellitus without complication (HCC)    Diastolic dysfunction    a. 06/2022 Echo (in setting of acute PE):  EF 50-55%, no rwma, mod-sev LVH, grI DD, mod reduced RV fxn, triv MR w/ MVP of post leaflet, ? bicuspid AoV w/ triv AI and Ao sclerosis.   Hypertension    Pulmonary embolism (HCC)    a. 06/2022 CTA: Large volume PE affecting all lobar braches w/ R heart strain-->s/p mech thrombectomy.   Schizo-affective schizophrenia (HCC)    Stroke [facility])    TIA (transient ischemic attack)    Tobacco abuse     Past Surgical History:  Procedure Laterality Date   PULMONARY THROMBECTOMY Bilateral 06/30/2022   Procedure: PULMONARY THROMBECTOMY;  Surgeon: Renford Dills, MD;  Location: [facility] INVASIVE CV LAB;  Service: Cardiovascular;  Laterality: Bilateral;     Allergies  Allergies  Allergen Reactions   Haldol [Haloperidol Lactate]     History of Present Illness    56 y.o. male with a history of polysubstance abuse, HTN, DM, stroke/TIA, and schizo-affective disorder.  He initially presented to the ED on 5/21 w/ complaints of leg wkns.  He says he was walking that day and both of his legs suddenly "gave out" on him, causing him to fall.  No presyncope/syncope.  He was found by friends, who called EMS.  In the ED, he was tachycardic and mildly  hypotensive (89/70).  Code stroke was called.  CT head showed old R MCA territory infarct, later corroborated by MRI.  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  Tox screen + for cocaine.  ECG w/ inc RBBB (new), LAD, LAFB, poor R progression.  hsTrops 362  466.  Echo w/ EF of 50-55%, no rwma, GrI diast dysfxn, mod reduced RV fxn, MVP w/ triv MR, and question of bicuspid AoV w/ triv AI and Ao sclerosis.  He was seen by vascular surgery and underwent mechanical thrombectomy on 5/22.  Pt then left AMA.  He did not pick up eliquis Rx.  On 5/24, he was feeling well but then around 10:30 AM, he developed severe retrosternal chest pain and dyspnea.  Symptoms persisted and he eventually called EMS.  He was taken to the ED where ECG showed RSR, 87, LAD, LAFB, poor R progression.  Trops trending down from prior presentation @ 288  213.  CXR unremarkable.  LE duplex neg for DVT.  Eliquis resumed.  We've been asked to eval 2/2 elevated hsTrop.  Pt denies c/p since arrival.  Further, he notes that prior to 5/21 presentation, he had not experienced chest pain or dyspnea w/ usual activities.  No complaints this AM.  Inpatient Medications  apixaban  10 mg Oral BID   Followed by   Melene Muller ON 07/10/2022] apixaban  5 mg Oral BID   insulin aspart  0-15 Units Subcutaneous TID WC   insulin glargine-yfgn  20 Units Subcutaneous QHS    Family History    Family History  Problem Relation Age of Onset   High blood pressure Father    Diabetes Father    Renal Disease Father    He indicated that his mother is alive. He indicated that his father is deceased.   Social History    Social History   Socioeconomic History   Marital status: Single    Spouse name: Not on file   Number of children: Not on file   Years of education: Not on file   Highest education level: Not on file   Occupational History   Not on file  Tobacco Use   Smoking status: Every Day    Packs/day: .5    Types: Cigarettes   Smokeless tobacco: Never  Substance and Sexual Activity   Alcohol use: Yes    Alcohol/week: 2.0 standard drinks of alcohol    Types: 2 Cans of beer per week    Comment: per week   Drug use: Yes    Types: Cocaine    Comment: Uses weekly   Sexual activity: Not on file  Other Topics Concern   Not on file  Social History Narrative   Lives locally.  Does not routinely exercise.   Social Determinants of Health   Financial Resource Strain: Low Risk  (10/13/2017)   Overall Financial Resource Strain (CARDIA)    Difficulty of Paying Living Expenses: Not hard at all  Food Insecurity: No Food Insecurity (10/13/2017)   Hunger Vital Sign    Worried About Running Out of Food in the Last Year: Never true    Ran Out of Food in the Last Year: Never true  Transportation Needs: Unmet Transportation Needs (07/04/2022)   PRAPARE - Transportation    Lack of Transportation (Medical): Yes    Lack of Transportation (Non-Medical): Yes  Physical Activity: Inactive (10/13/2017)   Exercise Vital Sign    Days of Exercise per Week: 0 days    Minutes of Exercise per Session: 0 min  Stress: No Stress Concern Present (10/13/2017)   [employer] of Occupational Health - Occupational Stress Questionnaire    Feeling of Stress : Not at all  Social Connections: Somewhat Isolated (10/13/2017)   Social Connection and Isolation Panel [NHANES]    Frequency of Communication with Friends and Family: Three times a week    Frequency of Social Gatherings with Friends and Family: Never    Attends Religious Services: 1 to 4 times per year    Active Member of [employer] or Organizations: No    Attends Banker Meetings: Never    Marital Status: Separated  Intimate Partner Violence: Not At Risk (10/13/2017)   Humiliation, Afraid, Rape, and Kick questionnaire    Fear of Current or Ex-Partner: No     Emotionally Abused: No    Physically Abused: No    Sexually Abused: No     Review of Systems    General:  No chills, fever, night sweats or weight changes.  Cardiovascular:  +++ chest pain and dyspnea on 5/21 and 5/24 presentations.  No edema, orthopnea, palpitations, paroxysmal nocturnal dyspnea. Dermatological: No rash, lesions/masses Respiratory: No cough, +++ dyspnea Urologic: No hematuria, dysuria Abdominal:   No nausea, vomiting, diarrhea, bright red blood per rectum,  melena, or hematemesis Neurologic:  No visual changes, wkns, changes in mental status. All other systems reviewed and are otherwise negative except as noted above.  Physical Exam    Blood pressure (!) 151/93, pulse 64, temperature 98 F (36.7 C), resp. rate 18, height 5\' 7"  (1.702 m), weight 93 kg, SpO2 98 %.  General: Pleasant, NAD Psych: Flat affect. Neuro: Alert and oriented X 3. Moves all extremities spontaneously. HEENT: Normal  Neck: Supple without bruits or JVD. Lungs:  Resp regular and unlabored, CTA. Heart: RRR no s3, s4, or murmurs. Abdomen: Soft, non-tender, non-distended, BS + x 4.  Extremities: No clubbing, cyanosis or edema. DP/PT2+, Radials 2+ and equal bilaterally.  Labs    Cardiac Enzymes Recent Labs  Lab 06/29/22 0243 06/29/22 0446 07/02/22 1455 07/02/22 1655  TROPONINIHS 362* 466* 288* 213*     BNP    Component Value Date/Time   BNP 205.8 (H) 06/29/2022 0446   Lab Results  Component Value Date   WBC 3.9 (L) 07/03/2022   HGB 10.9 (L) 07/03/2022   HCT 33.3 (L) 07/03/2022   MCV 94.3 07/03/2022   PLT 186 07/03/2022    Recent Labs  Lab 07/03/22 0701 07/04/22 0419  NA 137 140  K 3.1* 3.5  CL 104 108  CO2 26 27  BUN 8 12  CREATININE 0.83 0.80  CALCIUM 8.3* 8.4*  PROT 5.5*  --   BILITOT 0.5  --   ALKPHOS 60  --   ALT 24  --   AST 15  --   GLUCOSE 213* 165*   Lab Results  Component Value Date   CHOL 251 (H) 06/29/2022   HDL 26 (L) 06/29/2022   LDLCALC 206 (H)  06/29/2022   TRIG 96 06/29/2022   Lab Results  Component Value Date   HGBA1C 14.0 (H) 06/29/2022      Radiology Studies    US Venous Img Lower Bilateral (DVT)  Result Date: 07/03/2022 CLINICAL DATA:  Recent bilateral pulmonary embolism and pulmonary thrombectomy. EXAM: BILATERAL LOWER EXTREMITY VENOUS DOPPLER ULTRASOUND TECHNIQUE: Gray-scale sonography with graded compression, as well as color Doppler and duplex ultrasound were performed to evaluate the lower extremity deep venous systems from the level of the common femoral vein and including the common femoral, femoral, profunda femoral, popliteal and calf veins including the posterior tibial, peroneal and gastrocnemius veins when visible. The superficial great saphenous vein was also interrogated. Spectral Doppler was utilized to evaluate flow at rest and with distal augmentation maneuvers in the common femoral, femoral and popliteal veins. COMPARISON:  None Available. FINDINGS: RIGHT LOWER EXTREMITY Common Femoral Vein: No evidence of thrombus. Normal compressibility, respiratory phasicity and response to augmentation. Saphenofemoral Junction: No evidence of thrombus. Normal compressibility and flow on color Doppler imaging. Profunda Femoral Vein: No evidence of thrombus. Normal compressibility and flow on color Doppler imaging. Femoral Vein: No evidence of thrombus. Normal compressibility, respiratory phasicity and response to augmentation. Popliteal Vein: No evidence of thrombus. Normal compressibility, respiratory phasicity and response to augmentation. Calf Veins: No evidence of thrombus. Normal compressibility and flow on color Doppler imaging. Superficial Great Saphenous Vein: No evidence of thrombus. Normal compressibility. Venous Reflux:  None. Other Findings: No evidence of superficial thrombophlebitis or abnormal fluid collection. LEFT LOWER EXTREMITY Common Femoral Vein: No evidence of thrombus. Normal compressibility, respiratory  phasicity and response to augmentation. Saphenofemoral Junction: No evidence of thrombus. Normal compressibility and flow on color Doppler imaging. Profunda Femoral Vein: No evidence of thrombus. Normal compressibility and flow on  color Doppler imaging. Femoral Vein: No evidence of thrombus. Normal compressibility, respiratory phasicity and response to augmentation. Popliteal Vein: No evidence of thrombus. Normal compressibility, respiratory phasicity and response to augmentation. Calf Veins: No evidence of thrombus. Normal compressibility and flow on color Doppler imaging. Superficial Great Saphenous Vein: No evidence of thrombus. Normal compressibility. Venous Reflux:  None. Other Findings: No evidence of superficial thrombophlebitis or abnormal fluid collection. IMPRESSION: No evidence of deep venous thrombosis in either lower extremity. Electronically Signed   By: Irish Lack M.D.   On: 07/03/2022 12:24   DG Chest 2 View  Result Date: 07/02/2022 CLINICAL DATA:  Chest pain EXAM: CHEST - 2 VIEW COMPARISON:  Radiograph 06/29/2022 FINDINGS: Unchanged cardiomediastinal silhouette. There is no focal airspace consolidation. There is no large effusion or evidence of pneumothorax. No acute osseous abnormality. IMPRESSION: No focal airspace disease. Electronically Signed   By: Caprice Renshaw M.D.   On: 07/02/2022 15:33   PERIPHERAL VASCULAR CATHETERIZATION  Result Date: 06/30/2022 See surgical note for result.  MR BRAIN WO CONTRAST  Result Date: 06/29/2022 CLINICAL DATA:  Stroke, follow up EXAM: MRI HEAD WITHOUT CONTRAST TECHNIQUE: Multiplanar, multiecho pulse sequences of the brain and surrounding structures were obtained without intravenous contrast. COMPARISON:  CT Head, head/neck angiogram, CT perfusion 06/29/22 FINDINGS: Brain: Negative for an acute infarct. No hemorrhage. No extra-axial fluid collection. No hydrocephalus. There is a chronic right MCA territory infarct. Sequela of moderate to severe  chronic microvascular ischemic change. Vascular: Normal flow voids. Skull and upper cervical spine: Normal marrow signal. Sinuses/Orbits: No middle ear or mastoid effusion. Trace mucosal thickening bilateral maxillary sinuses. Orbits are unremarkable. Other: None. IMPRESSION: 1. No acute intracranial process. 2. Chronic right MCA territory infarct. Electronically Signed   By: Lorenza Cambridge M.D.   On: 06/29/2022 12:45   DG Abd 1 View  Result Date: 06/29/2022 CLINICAL DATA:  MRI clearance. EXAM: ABDOMEN - 1 VIEW COMPARISON:  None Available. FINDINGS: Mild gaseous distention of the stomach. No gaseous dilatation of small bowel or colon. Visualized bony anatomy unremarkable. Foley catheter overlies the low pelvis. No unexpected radiopaque/metallic foreign body identified over the visualized abdomen or pelvis. IMPRESSION: 1. No unexpected radiopaque/metallic foreign body. 2. Mild gaseous distention of the stomach. Electronically Signed   By: Kennith Center M.D.   On: 06/29/2022 05:15   DG Chest Port 1 View  Result Date: 06/29/2022 CLINICAL DATA:  MRI clearance. EXAM: PORTABLE CHEST 1 VIEW COMPARISON:  10/09/2017 FINDINGS: The lungs are clear without focal pneumonia, edema, pneumothorax or pleural effusion. Cardiopericardial silhouette is at upper limits of normal for size. No acute bony abnormality. Telemetry leads overlie the chest. No evidence for unexpected radiopaque foreign body overlying the visualized thorax. IMPRESSION: 1. No acute cardiopulmonary findings. 2. No evidence for unexpected radiopaque/metallic foreign body. Electronically Signed   By: Kennith Center M.D.   On: 06/29/2022 05:13   CT Angio Chest PE W and/or Wo Contrast  Result Date: 06/29/2022 CLINICAL DATA:  Diaphoresis.  Found in parking lot EXAM: CT ANGIOGRAPHY CHEST WITH CONTRAST TECHNIQUE: Multidetector CT imaging of the chest was performed using the standard protocol during bolus administration of intravenous contrast. Multiplanar CT  image reconstructions and MIPs were obtained to evaluate the vascular anatomy. RADIATION DOSE REDUCTION: This exam was performed according to the departmental dose-optimization program which includes automated exposure control, adjustment of the mA and/or kV according to patient size and/or use of iterative reconstruction technique. CONTRAST:  OMNIPAQUE IOHEXOL 350 MG/ML SOLN COMPARISON:  None  Available. FINDINGS: Cardiovascular: Satisfactory opacification of the pulmonary arteries to the segmental level. Large volume of acute appearing clot in the bilateral main pulmonary arteries extending into all lobes with multiple occlusive segmental branches. Right heart strain which may be accentuated by left ventricular hypertrophy, RV to LV ratio measures 2.2. Atheromatous plaque along the coronaries. Mediastinum/Nodes: No mass or adenopathy Lungs/Pleura: No failure or infarct seen. Upper Abdomen: Contrast in the kidneys from recent CTA of the head and neck Musculoskeletal: No acute finding. Review of the MIP images confirms the above findings. IMPRESSION: 1. Large volume of acute pulmonary emboli affecting all lobar branches with right heart strain. 2. Coronary atherosclerosis. Electronically Signed   By: Tiburcio Pea M.D.   On: 06/29/2022 04:09   CT CEREBRAL PERFUSION W CONTRAST  Result Date: 06/29/2022 CLINICAL DATA:  Left-sided weakness EXAM: CT ANGIOGRAPHY HEAD AND NECK CT PERFUSION BRAIN TECHNIQUE: Multidetector CT imaging of the head and neck was performed using the standard protocol during bolus administration of intravenous contrast. Multiplanar CT image reconstructions and MIPs were obtained to evaluate the vascular anatomy. Carotid stenosis measurements (when applicable) are obtained utilizing NASCET criteria, using the distal internal carotid diameter as the denominator. Multiphase CT imaging of the brain was performed following IV bolus contrast injection. Subsequent parametric perfusion maps were  calculated using RAPID software. RADIATION DOSE REDUCTION: This exam was performed according to the departmental dose-optimization program which includes automated exposure control, adjustment of the mA and/or kV according to patient size and/or use of iterative reconstruction technique. CONTRAST:  OMNIPAQUE IOHEXOL 350 MG/ML SOLN COMPARISON:  None Available. FINDINGS: CTA NECK FINDINGS SKELETON: There is no bony spinal canal stenosis. No lytic or blastic lesion. OTHER NECK: Normal pharynx, larynx and major salivary glands. No cervical lymphadenopathy. Unremarkable thyroid gland. UPPER CHEST: There are large bilateral pulmonary arterial filling defects. AORTIC ARCH: There is no calcific atherosclerosis of the aortic arch. There is no aneurysm, dissection or hemodynamically significant stenosis of the visualized portion of the aorta. Conventional 3 vessel aortic branching pattern. The visualized proximal subclavian arteries are widely patent. RIGHT CAROTID SYSTEM: Normal without aneurysm, dissection or stenosis. LEFT CAROTID SYSTEM: No dissection, occlusion or aneurysm. There is mixed density atherosclerosis extending into the proximal ICA, resulting in less than 50% stenosis. VERTEBRAL ARTERIES: Codominant configuration. Both origins are clearly patent. There is no dissection, occlusion or flow-limiting stenosis to the skull base (V1-V3 segments). CTA HEAD FINDINGS POSTERIOR CIRCULATION: --Vertebral arteries: Normal V4 segments. --Inferior cerebellar arteries: Normal. --Basilar artery: Normal. --Superior cerebellar arteries: Normal. --Posterior cerebral arteries (PCA): Normal. ANTERIOR CIRCULATION: --Intracranial internal carotid arteries: Atherosclerotic calcification of the internal carotid arteries at the skull base without hemodynamically significant stenosis. --Anterior cerebral arteries (ACA): There is occlusion of the proximal right A2 segment (series 7, image 100). There is no left A1 segment, which is  a common variant. --Middle cerebral arteries (MCA): Normal. VENOUS SINUSES: As permitted by contrast timing, patent. ANATOMIC VARIANTS: None Review of the MIP images confirms the above findings. CT Brain Perfusion Findings: The perfusion component of this scan is markedly degraded by motion and by an abnormal time-enhancement curve. No alive old diagnostic information from this component of the scan. IMPRESSION: 1. Large bilateral pulmonary arterial filling defects, consistent with acute pulmonary emboli. 2. 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 Critical Value/emergent results (item#1) were called by telephone at the time of interpretation on 06/29/2022 at  2:37 am to provider [facility] , who verbally acknowledged these results. Electronically Signed   By: Deatra Robinson M.D.   On: 06/29/2022 02:42   CT ANGIO HEAD NECK W WO CM W PERF (CODE STROKE)  Result Date: 06/29/2022 CLINICAL DATA:  Left-sided weakness EXAM: CT ANGIOGRAPHY HEAD AND NECK CT PERFUSION BRAIN TECHNIQUE: Multidetector CT imaging of the head and neck was performed using the standard protocol during bolus administration of intravenous contrast. Multiplanar CT image reconstructions and MIPs were obtained to evaluate the vascular anatomy. Carotid stenosis measurements (when applicable) are obtained utilizing NASCET criteria, using the distal internal carotid diameter as the denominator. Multiphase CT imaging of the brain was performed following IV bolus contrast injection. Subsequent parametric perfusion maps were calculated using RAPID software. RADIATION DOSE REDUCTION: This exam was performed according to the departmental dose-optimization program which includes automated exposure control, adjustment of the mA and/or kV according to patient size and/or use of iterative reconstruction technique. CONTRAST:  OMNIPAQUE IOHEXOL 350 MG/ML  SOLN COMPARISON:  None Available. FINDINGS: CTA NECK FINDINGS SKELETON: There is no bony spinal canal stenosis. No lytic or blastic lesion. OTHER NECK: Normal pharynx, larynx and major salivary glands. No cervical lymphadenopathy. Unremarkable thyroid gland. UPPER CHEST: There are large bilateral pulmonary arterial filling defects. AORTIC ARCH: There is no calcific atherosclerosis of the aortic arch. There is no aneurysm, dissection or hemodynamically significant stenosis of the visualized portion of the aorta. Conventional 3 vessel aortic branching pattern. The visualized proximal subclavian arteries are widely patent. RIGHT CAROTID SYSTEM: Normal without aneurysm, dissection or stenosis. LEFT CAROTID SYSTEM: No dissection, occlusion or aneurysm. There is mixed density atherosclerosis extending into the proximal ICA, resulting in less than 50% stenosis. VERTEBRAL ARTERIES: Codominant configuration. Both origins are clearly patent. There is no dissection, occlusion or flow-limiting stenosis to the skull base (V1-V3 segments). CTA HEAD FINDINGS POSTERIOR CIRCULATION: --Vertebral arteries: Normal V4 segments. --Inferior cerebellar arteries: Normal. --Basilar artery: Normal. --Superior cerebellar arteries: Normal. --Posterior cerebral arteries (PCA): Normal. ANTERIOR CIRCULATION: --Intracranial internal carotid arteries: Atherosclerotic calcification of the internal carotid arteries at the skull base without hemodynamically significant stenosis. --Anterior cerebral arteries (ACA): There is occlusion of the proximal right A2 segment (series 7, image 100). There is no left A1 segment, which is a common variant. --Middle cerebral arteries (MCA): Normal. VENOUS SINUSES: As permitted by contrast timing, patent. ANATOMIC VARIANTS: None Review of the MIP images confirms the above findings. CT Brain Perfusion Findings: The perfusion component of this scan is markedly degraded by motion and by an abnormal time-enhancement  curve. No alive old diagnostic information from this component of the scan. IMPRESSION: 1. Large bilateral pulmonary arterial filling defects, consistent with acute pulmonary emboli. 2. 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 Critical Value/emergent results (item#1) were called by telephone at the time of interpretation on 06/29/2022 at 2:37 am to provider [facility] , who verbally acknowledged these results. Electronically Signed   By: Deatra Robinson M.D.   On: 06/29/2022 02:42   CT HEAD CODE STROKE WO CONTRAST  Result Date: 06/29/2022 CLINICAL DATA:  Code stroke.  Left-sided weakness EXAM: CT HEAD WITHOUT CONTRAST TECHNIQUE: Contiguous axial images were obtained from the base of the skull through the vertex without intravenous contrast. RADIATION DOSE REDUCTION: This exam was performed according to the departmental dose-optimization program which includes automated exposure control, adjustment of the mA and/or kV according to  patient size and/or use of iterative reconstruction technique. 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 No acute hemorrhage. The size and configuration of the ventricles and extra-axial CSF spaces are normal. There is hypoattenuation of the periventricular white matter, most commonly indicating chronic ischemic microangiopathy. Vascular: Atherosclerotic calcification of the vertebral and internal carotid arteries at the skull base. No abnormal hyperdensity of the major intracranial arteries or dural venous sinuses. Skull: The visualized skull base, calvarium and extracranial soft tissues are normal. Sinuses/Orbits: No fluid levels or advanced mucosal thickening of the visualized paranasal sinuses. No mastoid or middle ear effusion. The orbits are normal. ASPECTS [facility] Stroke Program Early CT Score) - Ganglionic level infarction (caudate,  lentiform nuclei, internal capsule, insula, M1-M3 cortex): 7 - Supraganglionic infarction (M4-M6 cortex): 3 Total score (0-10 with 10 being normal): 10 IMPRESSION: 1. No acute hemorrhage. 2. 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 3. ASPECTS is 10. These results were called by telephone at the time of interpretation on 06/29/2022 at 2:09 am to provider [facility] , who verbally acknowledged these results. Electronically Signed   By: Deatra Robinson M.D.   On: 06/29/2022 02:09    ECG & Cardiac Imaging    RSR, 87, LAD, LAFB, poor R progression - personally reviewed.  Assessment & Plan    1.  Acute PE/Chest Pain/Demand Ischemia:  pt presented 5/21 w/ bilat leg wkns and per notes, L sided wkns, though he denies this today.  CTA chest showed large volume acute PE affecting all lobar branches w/ R heart strain.  He subsequently underwent thrombectomy.  In the setting of PE, hsTrops elevated @ 362  466.  Echo showed EF of 50-55% w/o rwma, mod-sev LVH, GrI DD, ? Bicuspid AoV.  He left AMA but returned 5/24 w/ recurrent c/p.  hsTrops trending down @ 288  213.  C/p free this AM.  Admission ECG w/o acute ST/T changes.  Review of CTA does show coronary Ca2+/atherosclerosis.  Suspect troponin elevations 2/2 demand ischemia in the setting of R heart strain and acute PE, though cannot entirely r/o role of coronary vasospasm in the setting of cocaine.  As he does have ongoing RF for CAD (DM, HTN, HL, tob abuse, obesity, FH) w/ evidence of cor Ca2+, outpt risk stratification appropriate once stabilized from PE mgmt perspective.  I will arrange outpt f/u.  Adding amlodipine in setting of HTN/? Cor vasospasm.  No asa in setting of eliquis.  2.  Essential HTN:  pressures elevated - 130's to 150's.  Poor candidate for ? blocker in setting of ongoing cocaine use.  Nl EF.  Will add amlodipine 2.5 mg daily as above.  Consider acei/arb in setting of DM, provided that he'll f/u for lab work.  3.  HL:  LDL 206.  H/o stroke and  cor Ca2+. Adding statin.  4.  DMII:  A1c 14.0.  Per IM. Suspect compliance w/ insulin will be poor.  Add statin.  Consider ARB.  5.  Polysubstance abuse:  Using cocaine regularly.  Smokes 1/2 ppd.  Occas etoh.  Complete cessation advised.  Will benefit from social work eval and outpt resources.  6.  Schizo-affective disorder:  no meds @ home.  7.  Normocytic anemia:  H/H drifting down since 5/21.  No known bleeding.  Likely 2/2 blood draws.  Follow as outpt on eliquis.  8.  Hypokalemia:  K 3.1 5/25, improved this AM.  Signed, Nicolasa Ducking, NP 07/04/2022, 10:32 AM  For questions or  updates, please contact   Please consult [email or web address] for contact info under Cardiology/STEMI.

## 2022-07-04 NOTE — Discharge Summary (Signed)
Physician Discharge Summary   Patient: Henry Munoz MRN: [id number] DOB: [birth year]  Admit date:     07/02/2022  Discharge date: 07/04/22  Discharge Physician: Henry Munoz   PCP: Henry Munoz   Recommendations at discharge:   patient recommended to pick up his eliquis from [facility] [place]. 30 day trial coupon was given. Abstain from using street drugs follow-up Henry Munoz in 1 to 2 weeks  Discharge Diagnoses: Active Problems:   Cocaine abuse (HCC)   Schizophrenia (HCC)  Henry Munoz is a 57 y.o. male with medical history significant of DMII insulin dependent, HTN, Schizo-affective schizophrenia, CVA/TIA, polysubstance abuse who has interim history of diagnosis of  PE on 5/21 with right heart strain for which he underwent thrombectomy. Patient however signed out ama  on 5/22 prior to completing therapy. Patient was placed on Eliquis at discharge , however he has been non-compliant and has not taken Eliquis. He returns today- 2 days later with acute on set of chest pain that started around 11 am    Pulmonary embolism, unprovoked --Massive with moderately reduced RV function on TTE. S/p thrombolysis and mechanical thrombectomy with vascular on 5/22. Left AMA. Returns with chest pain that has resolved.  --Currently breathing comfortably and sats normal on room air, ambulated with RN without difficulty. --US doppler  negative for DVT. - continue apixaban--30 day free coupon given - outpt cancer screenings--defer to PCP  History of schizophrenia Patient says he is bothered by visual hallucinations. He is not on home meds. - psychiatry consult noted--Cleared for discharge from psych stand point. Community resources given  Staph hominis bacteremia In one blood culture from 5/21, f/u cultures negative, this is likely a contaminant -repeat blood cultures negative   elevated troponin-- appears demand ischemia in the setting of recent PE P--resenting trops  in the 300s on 5/21, have improved to 200s. No wall motion abnormalities on 5/21 TTE. No overt ischemic changes on EKG. Does abuse cocaine. Coronary atherosclerosis seen on CT.  -- cardiology input appreciated. Started on low-dose amlodipine for blood pressure.  Hypertension -- started on amlodipine  Cocaine abuse -- patient advised on abstaining from street drugs   T2DM Uncontrolled, A1c 14.  - continue basal/bolus insulin, titrate as needed -- defer further management with PCP  Hypokalemia/ Hypomagnesemia - replete and monitor       DVT prophylaxis: apixaban Code Status: full Family Communication: none @ bedside  Patient is hemodynamically stable. Will discharge to home. Discussed with TOC will get taxi cab voucher. Patient was informed [facility] [place] so he can pick up his eliquis prescription. Patient voice understanding. He will need to follow-up with his Animator Munoz PCP as outpatient.    Diet recommendation:  Discharge Diet Orders (From admission, onward)     Start     Ordered   07/04/22 0000  Diet - low sodium heart healthy        07/04/22 1011   07/04/22 0000  Diet Carb Modified        07/04/22 1011            DISCHARGE MEDICATION: Allergies as of 07/04/2022       Reactions   Haldol [haloperidol Lactate]         Medication List     STOP taking these medications    doxycycline 100 MG tablet Commonly known as: VIBRA-TABS       TAKE these medications    Antifungal Clotrimazole  1 % cream Generic drug: clotrimazole Apply 1 Application topically 2 (two) times daily.   apixaban 5 MG Tabs tablet Commonly known as: ELIQUIS Take 2 tablets (10 mg total) by mouth 2 (two) times daily. And then from 07/10/22 take 5 mg two times daily What changed:  how much to take how to take this when to take this additional instructions   Blood Glucose Monitoring Suppl Devi 1 each by Does not apply route 3 (three) times daily. May  dispense any manufacturer covered by patient's insurance.   BLOOD GLUCOSE TEST STRIPS Strp 1 each by Does not apply route 3 (three) times daily. Use as directed to check blood sugar. May dispense any manufacturer covered by patient's insurance and fits patient's device.   insulin aspart 100 UNIT/ML FlexPen Commonly known as: NOVOLOG If eating and Blood Glucose (BG) 80 or higher inject 4 units for meal coverage and add correction dose per scale. If not eating, correction dose only. BG <150= 0 unit; BG 150-200= 1 unit; BG 201-250= 2 unit; BG 251-300= 3 unit; BG 301-350= 4 unit; BG 351-400= 5 unit; BG >400= 6 unit and Call Primary care.   insulin glargine 100 UNIT/ML Solostar Pen Commonly known as: LANTUS Inject 20 Units into the skin daily. May substitute as needed per insurance.   Lancet Device Misc 1 each by Does not apply route 3 (three) times daily. May dispense any manufacturer covered by patient's insurance.   Lancets Misc 1 each by Does not apply route 3 (three) times daily. Use as directed to check blood sugar. May dispense any manufacturer covered by patient's insurance and fits patient's device.   Pen Needles 31G X 5 MM Misc 1 each by Does not apply route 3 (three) times daily. May dispense any manufacturer covered by patient's insurance.   polyethylene glycol 17 g packet Commonly known as: MIRALAX / GLYCOLAX Take 17 g by mouth daily as needed for moderate constipation.        Follow-up Information     Henry Munoz. Schedule an appointment as soon as possible for a visit in 1 week(s).   Specialty: General Practice Why: pt to call and make f/u appt in 1 week Contact information: [address] [place] [address] [phone]                Discharge Exam: Henry Munoz Weights   07/02/22 1453  Weight: 93 kg   Obese alert and oriented times three respiratory clear to auscultation cardiovascular both heart sounds normal no murmur neuro-  nonfocal. Condition at discharge: fair  The results of significant diagnostics from this hospitalization (including imaging, microbiology, ancillary and laboratory) are listed below for reference.   Imaging Studies: US Venous Img Lower Bilateral (DVT)  Result Date: 07/03/2022 CLINICAL DATA:  Recent bilateral pulmonary embolism and pulmonary thrombectomy. EXAM: BILATERAL LOWER EXTREMITY VENOUS DOPPLER ULTRASOUND TECHNIQUE: Gray-scale sonography with graded compression, as well as color Doppler and duplex ultrasound were performed to evaluate the lower extremity deep venous systems from the level of the common femoral vein and including the common femoral, femoral, profunda femoral, popliteal and calf veins including the posterior tibial, peroneal and gastrocnemius veins when visible. The superficial great saphenous vein was also interrogated. Spectral Doppler was utilized to evaluate flow at rest and with distal augmentation maneuvers in the common femoral, femoral and popliteal veins. COMPARISON:  None Available. FINDINGS: RIGHT LOWER EXTREMITY Common Femoral Vein: No evidence of thrombus. Normal compressibility, respiratory phasicity and response to augmentation.  Saphenofemoral Junction: No evidence of thrombus. Normal compressibility and flow on color Doppler imaging. Profunda Femoral Vein: No evidence of thrombus. Normal compressibility and flow on color Doppler imaging. Femoral Vein: No evidence of thrombus. Normal compressibility, respiratory phasicity and response to augmentation. Popliteal Vein: No evidence of thrombus. Normal compressibility, respiratory phasicity and response to augmentation. Calf Veins: No evidence of thrombus. Normal compressibility and flow on color Doppler imaging. Superficial Great Saphenous Vein: No evidence of thrombus. Normal compressibility. Venous Reflux:  None. Other Findings: No evidence of superficial thrombophlebitis or abnormal fluid collection. LEFT LOWER EXTREMITY  Common Femoral Vein: No evidence of thrombus. Normal compressibility, respiratory phasicity and response to augmentation. Saphenofemoral Junction: No evidence of thrombus. Normal compressibility and flow on color Doppler imaging. Profunda Femoral Vein: No evidence of thrombus. Normal compressibility and flow on color Doppler imaging. Femoral Vein: No evidence of thrombus. Normal compressibility, respiratory phasicity and response to augmentation. Popliteal Vein: No evidence of thrombus. Normal compressibility, respiratory phasicity and response to augmentation. Calf Veins: No evidence of thrombus. Normal compressibility and flow on color Doppler imaging. Superficial Great Saphenous Vein: No evidence of thrombus. Normal compressibility. Venous Reflux:  None. Other Findings: No evidence of superficial thrombophlebitis or abnormal fluid collection. IMPRESSION: No evidence of deep venous thrombosis in either lower extremity. Electronically Signed   By: Irish Lack M.D.   On: 07/03/2022 12:24   DG Chest 2 View  Result Date: 07/02/2022 CLINICAL DATA:  Chest pain EXAM: CHEST - 2 VIEW COMPARISON:  Radiograph 06/29/2022 FINDINGS: Unchanged cardiomediastinal silhouette. There is no focal airspace consolidation. There is no large effusion or evidence of pneumothorax. No acute osseous abnormality. IMPRESSION: No focal airspace disease. Electronically Signed   By: Caprice Renshaw M.D.   On: 07/02/2022 15:33   PERIPHERAL VASCULAR CATHETERIZATION  Result Date: 06/30/2022 See surgical note for result.  ECHOCARDIOGRAM COMPLETE  Result Date: 06/29/2022    ECHOCARDIOGRAM REPORT   Patient Name:   Henry Munoz Date of Exam: 06/29/2022 Medical Rec #:  [id number]         Height:       67.0 in Accession #:    [id number]        Weight:       252.2 lb Date of Birth:  [birth year]         BSA:          2.232 m Patient Age:    56 years          BP:           127/67 mmHg Patient Gender: M                 HR:           86 bpm. Exam  Location:  [facility] Procedure: 2D Echo, Cardiac Doppler and Color Doppler Indications:     Stroke 63.9  History:         Patient has prior history of Echocardiogram examinations, most                  recent 02/01/2016. TIA; Risk Factors:Diabetes and Hypertension.                  Schizophrenia.  Sonographer:     Cristela Blue Referring Phys:  [id number] Hubbard Hartshorn OUMA Diagnosing Phys: Yvonne Kendall MD IMPRESSIONS  1. Left ventricular ejection fraction, by estimation, is 50 to 55%. The left ventricle has low normal function. The left ventricle has no regional wall motion abnormalities.  moderate to severe left ventricular hypertrophy. Left ventricular diastolic parameters are consistent with Grade I diastolic dysfunction (impaired relaxation).  2. Right ventricular systolic function is moderately reduced. The right ventricular size is mildly enlarged. Tricuspid regurgitation signal is inadequate for assessing PA pressure.  3. The mitral valve is abnormal. Trivial mitral valve regurgitation. 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  4. The aortic valve is suspicious for bicuspid morphology but is suboptimally imaged. There is mild thickening of the aortic valve. Aortic valve regurgitation is trivial. Aortic valve sclerosis is present, with no evidence of aortic valve stenosis. FINDINGS  Left Ventricle: Left ventricular ejection fraction, by estimation, is 50 to 55%. The left ventricle has low normal function. The left ventricle has no regional wall motion abnormalities. The left ventricular internal cavity size was normal in size. Moderate to severe left ventricular hypertrophy. Left ventricular diastolic parameters are consistent with Grade I diastolic dysfunction (impaired relaxation). Right Ventricle: The right ventricular size is mildly enlarged. No increase in right ventricular wall thickness. Right ventricular systolic function is moderately reduced. Tricuspid regurgitation  signal is inadequate for assessing PA pressure. Left Atrium: Left atrial size was normal in size. Right Atrium: Right atrial size was normal in size. Pericardium: There is no evidence of pericardial effusion. Mitral Valve: The mitral valve is abnormal. 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 Trivial mitral valve regurgitation. Tricuspid Valve: The tricuspid valve is grossly normal. Tricuspid valve regurgitation is trivial. Aortic Valve: The aortic valve is suspicious for bicuspid morphology but is suboptimally imaged. There is mild thickening of the aortic valve. There is mild aortic valve annular calcification. Aortic valve regurgitation is trivial. Aortic valve sclerosis  is present, with no evidence of aortic valve stenosis. Aortic valve mean gradient measures 2.0 mmHg. Aortic valve peak gradient measures 4.0 mmHg. Aortic valve area, by VTI measures 2.87 cm. Pulmonic Valve: The pulmonic valve was not well visualized. Pulmonic valve regurgitation is not visualized. No evidence of pulmonic stenosis. Aorta: The aortic root is normal in size and structure. Pulmonary Artery: The pulmonary artery is not well seen. Venous: The inferior vena cava was not well visualized. IAS/Shunts: The interatrial septum was not well visualized.  LEFT VENTRICLE PLAX 2D LVIDd:         3.90 cm   Diastology LVIDs:         2.90 cm   LV e' medial:    4.46 cm/s LV PW:         1.54 cm   LV E/e' medial:  9.8 LV IVS:        1.62 cm   LV e' lateral:   6.42 cm/s LVOT diam:     2.10 cm   LV E/e' lateral: 6.8 LV SV:         33 LV SV Index:   15 LVOT Area:     3.46 cm  RIGHT VENTRICLE RV Mid diam:    3.65 cm RV S prime:     6.20 cm/s TAPSE (M-mode): 1.3 cm LEFT ATRIUM             Index        RIGHT ATRIUM           Index LA diam:        2.40 cm 1.08 cm/m   RA Area:     12.30 cm LA Vol (A2C):   22.9 ml 10.26 ml/m  RA Volume:  31.60 ml  14.16 ml/m LA Vol (A4C):   17.0 ml 7.62 ml/m LA Biplane Vol: 21.2 ml  9.50 ml/m  AORTIC VALVE AV Area (Vmax):    2.57 cm AV Area (Vmean):   2.66 cm AV Area (VTI):     2.87 cm AV Vmax:           100.00 cm/s AV Vmean:          69.100 cm/s AV VTI:            0.116 m AV Peak Grad:      4.0 mmHg AV Mean Grad:      2.0 mmHg LVOT Vmax:         74.20 cm/s LVOT Vmean:        53.100 cm/s LVOT VTI:          0.096 m LVOT/AV VTI ratio: 0.83  AORTA Ao Root diam: 2.90 cm MITRAL VALVE MV Area (PHT): 3.03 cm    SHUNTS MV Decel Time: 250 msec    Systemic VTI:  0.10 m MV E velocity: 43.50 cm/s  Systemic Diam: 2.10 cm MV A velocity: 66.60 cm/s MV E/A ratio:  0.65 Cristal Deer End MD Electronically signed by Yvonne Kendall MD Signature Date/Time: 06/29/2022/5:54:03 PM    Final    MR BRAIN WO CONTRAST  Result Date: 06/29/2022 CLINICAL DATA:  Stroke, follow up EXAM: MRI HEAD WITHOUT CONTRAST TECHNIQUE: Multiplanar, multiecho pulse sequences of the brain and surrounding structures were obtained without intravenous contrast. COMPARISON:  CT Head, head/neck angiogram, CT perfusion 06/29/22 FINDINGS: Brain: Negative for an acute infarct. No hemorrhage. No extra-axial fluid collection. No hydrocephalus. There is a chronic right MCA territory infarct. Sequela of moderate to severe chronic microvascular ischemic change. Vascular: Normal flow voids. Skull and upper cervical spine: Normal marrow signal. Sinuses/Orbits: No middle ear or mastoid effusion. Trace mucosal thickening bilateral maxillary sinuses. Orbits are unremarkable. Other: None. IMPRESSION: 1. No acute intracranial process. 2. Chronic right MCA territory infarct. Electronically Signed   By: Lorenza Cambridge M.D.   On: 06/29/2022 12:45   DG Abd 1 View  Result Date: 06/29/2022 CLINICAL DATA:  MRI clearance. EXAM: ABDOMEN - 1 VIEW COMPARISON:  None Available. FINDINGS: Mild gaseous distention of the stomach. No gaseous dilatation of small bowel or colon. Visualized bony anatomy unremarkable. Foley catheter overlies the low pelvis. No unexpected  radiopaque/metallic foreign body identified over the visualized abdomen or pelvis. IMPRESSION: 1. No unexpected radiopaque/metallic foreign body. 2. Mild gaseous distention of the stomach. Electronically Signed   By: Kennith Center M.D.   On: 06/29/2022 05:15   DG Chest Port 1 View  Result Date: 06/29/2022 CLINICAL DATA:  MRI clearance. EXAM: PORTABLE CHEST 1 VIEW COMPARISON:  10/09/2017 FINDINGS: The lungs are clear without focal pneumonia, edema, pneumothorax or pleural effusion. Cardiopericardial silhouette is at upper limits of normal for size. No acute bony abnormality. Telemetry leads overlie the chest. No evidence for unexpected radiopaque foreign body overlying the visualized thorax. IMPRESSION: 1. No acute cardiopulmonary findings. 2. No evidence for unexpected radiopaque/metallic foreign body. Electronically Signed   By: Kennith Center M.D.   On: 06/29/2022 05:13   CT Angio Chest PE W and/or Wo Contrast  Result Date: 06/29/2022 CLINICAL DATA:  Diaphoresis.  Found in parking lot EXAM: CT ANGIOGRAPHY CHEST WITH CONTRAST TECHNIQUE: Multidetector CT imaging of the chest was performed using the standard protocol during bolus administration of intravenous contrast. Multiplanar CT image reconstructions and MIPs were obtained to evaluate the vascular anatomy.  RADIATION DOSE REDUCTION: This exam was performed according to the departmental dose-optimization program which includes automated exposure control, adjustment of the mA and/or kV according to patient size and/or use of iterative reconstruction technique. CONTRAST:  OMNIPAQUE IOHEXOL 350 MG/ML SOLN COMPARISON:  None Available. FINDINGS: Cardiovascular: Satisfactory opacification of the pulmonary arteries to the segmental level. Large volume of acute appearing clot in the bilateral main pulmonary arteries extending into all lobes with multiple occlusive segmental branches. Right heart strain which may be accentuated by left ventricular hypertrophy,  RV to LV ratio measures 2.2. Atheromatous plaque along the coronaries. Mediastinum/Nodes: No mass or adenopathy Lungs/Pleura: No failure or infarct seen. Upper Abdomen: Contrast in the kidneys from recent CTA of the head and neck Musculoskeletal: No acute finding. Review of the MIP images confirms the above findings. IMPRESSION: 1. Large volume of acute pulmonary emboli affecting all lobar branches with right heart strain. 2. Coronary atherosclerosis. Electronically Signed   By: Tiburcio Pea M.D.   On: 06/29/2022 04:09   CT CEREBRAL PERFUSION W CONTRAST  Result Date: 06/29/2022 CLINICAL DATA:  Left-sided weakness EXAM: CT ANGIOGRAPHY HEAD AND NECK CT PERFUSION BRAIN TECHNIQUE: Multidetector CT imaging of the head and neck was performed using the standard protocol during bolus administration of intravenous contrast. Multiplanar CT image reconstructions and MIPs were obtained to evaluate the vascular anatomy. Carotid stenosis measurements (when applicable) are obtained utilizing NASCET criteria, using the distal internal carotid diameter as the denominator. Multiphase CT imaging of the brain was performed following IV bolus contrast injection. Subsequent parametric perfusion maps were calculated using RAPID software. RADIATION DOSE REDUCTION: This exam was performed according to the departmental dose-optimization program which includes automated exposure control, adjustment of the mA and/or kV according to patient size and/or use of iterative reconstruction technique. CONTRAST:  OMNIPAQUE IOHEXOL 350 MG/ML SOLN COMPARISON:  None Available. FINDINGS: CTA NECK FINDINGS SKELETON: There is no bony spinal canal stenosis. No lytic or blastic lesion. OTHER NECK: Normal pharynx, larynx and major salivary glands. No cervical lymphadenopathy. Unremarkable thyroid gland. UPPER CHEST: There are large bilateral pulmonary arterial filling defects. AORTIC ARCH: There is no calcific atherosclerosis of the aortic arch.  There is no aneurysm, dissection or hemodynamically significant stenosis of the visualized portion of the aorta. Conventional 3 vessel aortic branching pattern. The visualized proximal subclavian arteries are widely patent. RIGHT CAROTID SYSTEM: Normal without aneurysm, dissection or stenosis. LEFT CAROTID SYSTEM: No dissection, occlusion or aneurysm. There is mixed density atherosclerosis extending into the proximal ICA, resulting in less than 50% stenosis. VERTEBRAL ARTERIES: Codominant configuration. Both origins are clearly patent. There is no dissection, occlusion or flow-limiting stenosis to the skull base (V1-V3 segments). CTA HEAD FINDINGS POSTERIOR CIRCULATION: --Vertebral arteries: Normal V4 segments. --Inferior cerebellar arteries: Normal. --Basilar artery: Normal. --Superior cerebellar arteries: Normal. --Posterior cerebral arteries (PCA): Normal. ANTERIOR CIRCULATION: --Intracranial internal carotid arteries: Atherosclerotic calcification of the internal carotid arteries at the skull base without hemodynamically significant stenosis. --Anterior cerebral arteries (ACA): There is occlusion of the proximal right A2 segment (series 7, image 100). There is no left A1 segment, which is a common variant. --Middle cerebral arteries (MCA): Normal. VENOUS SINUSES: As permitted by contrast timing, patent. ANATOMIC VARIANTS: None Review of the MIP images confirms the above findings. CT Brain Perfusion Findings: The perfusion component of this scan is markedly degraded by motion and by an abnormal time-enhancement curve. No alive old diagnostic information from this component of the scan. IMPRESSION: 1. Large bilateral pulmonary arterial filling defects, consistent  with acute pulmonary emboli. 2. 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 Critical Value/emergent results (item#1) were called by telephone at the  time of interpretation on 06/29/2022 at 2:37 am to provider [facility] , who verbally acknowledged these results. Electronically Signed   By: Deatra Robinson M.D.   On: 06/29/2022 02:42   CT ANGIO HEAD NECK W WO CM W PERF (CODE STROKE)  Result Date: 06/29/2022 CLINICAL DATA:  Left-sided weakness EXAM: CT ANGIOGRAPHY HEAD AND NECK CT PERFUSION BRAIN TECHNIQUE: Multidetector CT imaging of the head and neck was performed using the standard protocol during bolus administration of intravenous contrast. Multiplanar CT image reconstructions and MIPs were obtained to evaluate the vascular anatomy. Carotid stenosis measurements (when applicable) are obtained utilizing NASCET criteria, using the distal internal carotid diameter as the denominator. Multiphase CT imaging of the brain was performed following IV bolus contrast injection. Subsequent parametric perfusion maps were calculated using RAPID software. RADIATION DOSE REDUCTION: This exam was performed according to the departmental dose-optimization program which includes automated exposure control, adjustment of the mA and/or kV according to patient size and/or use of iterative reconstruction technique. CONTRAST:  OMNIPAQUE IOHEXOL 350 MG/ML SOLN COMPARISON:  None Available. FINDINGS: CTA NECK FINDINGS SKELETON: There is no bony spinal canal stenosis. No lytic or blastic lesion. OTHER NECK: Normal pharynx, larynx and major salivary glands. No cervical lymphadenopathy. Unremarkable thyroid gland. UPPER CHEST: There are large bilateral pulmonary arterial filling defects. AORTIC ARCH: There is no calcific atherosclerosis of the aortic arch. There is no aneurysm, dissection or hemodynamically significant stenosis of the visualized portion of the aorta. Conventional 3 vessel aortic branching pattern. The visualized proximal subclavian arteries are widely patent. RIGHT CAROTID SYSTEM: Normal without aneurysm, dissection or stenosis. LEFT CAROTID SYSTEM: No dissection,  occlusion or aneurysm. There is mixed density atherosclerosis extending into the proximal ICA, resulting in less than 50% stenosis. VERTEBRAL ARTERIES: Codominant configuration. Both origins are clearly patent. There is no dissection, occlusion or flow-limiting stenosis to the skull base (V1-V3 segments). CTA HEAD FINDINGS POSTERIOR CIRCULATION: --Vertebral arteries: Normal V4 segments. --Inferior cerebellar arteries: Normal. --Basilar artery: Normal. --Superior cerebellar arteries: Normal. --Posterior cerebral arteries (PCA): Normal. ANTERIOR CIRCULATION: --Intracranial internal carotid arteries: Atherosclerotic calcification of the internal carotid arteries at the skull base without hemodynamically significant stenosis. --Anterior cerebral arteries (ACA): There is occlusion of the proximal right A2 segment (series 7, image 100). There is no left A1 segment, which is a common variant. --Middle cerebral arteries (MCA): Normal. VENOUS SINUSES: As permitted by contrast timing, patent. ANATOMIC VARIANTS: None Review of the MIP images confirms the above findings. CT Brain Perfusion Findings: The perfusion component of this scan is markedly degraded by motion and by an abnormal time-enhancement curve. No alive old diagnostic information from this component of the scan. IMPRESSION: 1. Large bilateral pulmonary arterial filling defects, consistent with acute pulmonary emboli. 2. 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 Critical Value/emergent results (item#1) were called by telephone at the time of interpretation on 06/29/2022 at 2:37 am to provider [facility] , who verbally acknowledged these results. Electronically Signed   By: Deatra Robinson M.D.   On: 06/29/2022 02:42   CT HEAD CODE STROKE WO CONTRAST  Result Date: 06/29/2022 CLINICAL DATA:  Code stroke.  Left-sided weakness EXAM: CT HEAD WITHOUT  CONTRAST  TECHNIQUE: Contiguous axial images were obtained from the base of the skull through the vertex without intravenous contrast. RADIATION DOSE REDUCTION: This exam was performed according to the departmental dose-optimization program which includes automated exposure control, adjustment of the mA and/or kV according to patient size and/or use of iterative reconstruction technique. 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 No acute hemorrhage. The size and configuration of the ventricles and extra-axial CSF spaces are normal. There is hypoattenuation of the periventricular white matter, most commonly indicating chronic ischemic microangiopathy. Vascular: Atherosclerotic calcification of the vertebral and internal carotid arteries at the skull base. No abnormal hyperdensity of the major intracranial arteries or dural venous sinuses. Skull: The visualized skull base, calvarium and extracranial soft tissues are normal. Sinuses/Orbits: No fluid levels or advanced mucosal thickening of the visualized paranasal sinuses. No mastoid or middle ear effusion. The orbits are normal. ASPECTS [facility] Stroke Program Early CT Score) - Ganglionic level infarction (caudate, lentiform nuclei, internal capsule, insula, M1-M3 cortex): 7 - Supraganglionic infarction (M4-M6 cortex): 3 Total score (0-10 with 10 being normal): 10 IMPRESSION: 1. No acute hemorrhage. 2. 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 3. ASPECTS is 10. These results were called by telephone at the time of interpretation on 06/29/2022 at 2:09 am to provider [facility] , who verbally acknowledged these results. Electronically Signed   By: Deatra Robinson M.D.   On: 06/29/2022 02:09    Microbiology: Results for orders placed or performed during the Munoz encounter of 07/02/22  Culture, blood (routine x 2)     Status: None (Preliminary result)   Collection Time: 07/02/22  4:49 PM   Specimen: BLOOD  Result Value Ref Range  Status   Specimen Description BLOOD BLOOD LEFT FOREARM  Final   Special Requests   Final    BOTTLES DRAWN AEROBIC AND ANAEROBIC Blood Culture results may not be optimal due to an inadequate volume of blood received in culture bottles   Culture   Final    NO GROWTH 2 DAYS Performed at [facility], [address] [place], [place] [address]    Report Status PENDING  Incomplete  Blood culture (routine x 2)     Status: None (Preliminary result)   Collection Time: 07/02/22  5:11 PM   Specimen: BLOOD  Result Value Ref Range Status   Specimen Description BLOOD RIGHT ANTECUBITAL  Final   Special Requests   Final    BOTTLES DRAWN AEROBIC AND ANAEROBIC Blood Culture adequate volume   Culture   Final    NO GROWTH 2 DAYS Performed at [facility], [address] [place], [place] [address]    Report Status PENDING  Incomplete  Culture, blood (routine x 2)     Status: None (Preliminary result)   Collection Time: 07/02/22  5:12 PM   Specimen: BLOOD  Result Value Ref Range Status   Specimen Description BLOOD BLOOD RIGHT ARM  Final   Special Requests   Final    BOTTLES DRAWN AEROBIC AND ANAEROBIC Blood Culture adequate volume   Culture   Final    NO GROWTH 2 DAYS Performed at [facility], [address], [place], [place] [address]    Report Status PENDING  Incomplete    Labs: CBC: Recent Labs  Lab 06/29/22 0243 06/29/22 0446 06/30/22 0201 07/02/22 1455 07/03/22 0701  WBC 7.6 10.0 7.3 5.8 3.9*  NEUTROABS 5.6  --   --   --   --  HGB 13.8 13.2 13.1 11.8* 10.9*  HCT 43.8 41.7 39.7 36.1* 33.3*  MCV 97.1 97.2 93.0 94.5 94.3  PLT 197 168 182 199 186   Basic Metabolic Panel: Recent Labs  Lab 06/29/22 0446 06/29/22 0707 06/29/22 1129 06/29/22 1142 07/02/22 1455 07/03/22 0701 07/04/22 0419  NA 130*   < > 134* 135 138 137 140  K 6.8*   < > 3.9 3.8 3.5 3.1* 3.5  CL 99   < > 102 103 103 104 108  CO2 18*   < > 23 24 25 26 27   GLUCOSE 381*   < > 158*  154* 194* 213* 165*  BUN 18   < > 17 17 10 8 12   CREATININE 1.40*   < > 1.05 1.12 1.09 0.83 0.80  CALCIUM 8.5*   < > 8.8* 8.7* 9.1 8.3* 8.4*  MG 2.0  --   --   --   --  1.9  --   PHOS 4.2  --   --   --   --   --   --    < > = values in this interval not displayed.   Liver Function Tests: Recent Labs  Lab 06/29/22 0243 07/03/22 0701  AST 73* 15  ALT 64* 24  ALKPHOS 89 60  BILITOT 1.3* 0.5  PROT 6.8 5.5*  ALBUMIN 3.8 2.9*   CBG: Recent Labs  Lab 07/03/22 0919 07/03/22 1241 07/03/22 1624 07/03/22 2116 07/04/22 0902  GLUCAP 240* 254* 232* 194* 209*    Discharge time spent: greater than 30 minutes.  Signed: Enedina Finner, MD [facility] 07/04/2022

## 2022-07-04 NOTE — TOC Transition Note (Addendum)
Transition of Care [facility]) - CM/SW Discharge Note   Patient Details  Name: Henry Munoz MRN: [id number] Date of Birth: 11/19/1965  [facility]) CM/SW Contact:  Kemper Durie, RN Phone Number: 07/04/2022, 10:00 AM   Clinical Narrative:     Patient will discharge home today, per MD, does not have transportation (lives with brother who is bedbound).  He agrees to have [employer] transport home, but will need to go to [facility] to pick up medications prior to home.  Home address verified with patient, will obtain taxi voucher and call Cheyenne Adas when discharge instructions have been provided to patient.   Update 1055:  Taxi voucher and rider waiver completed.  Cheyenne Adas called, state will be her in 45 minutes to pick up patient for discharge. Bedside nurse aware, given voucher.    Final next level of care: Home/Self Care Barriers to Discharge: Barriers Resolved   Patient Goals and CMS Choice      Discharge Placement                         Discharge Plan and Services Additional resources added to the After Visit Summary for                                       Social Determinants of Health (SDOH) Interventions SDOH Screenings   Food Insecurity: No Food Insecurity (10/13/2017)  Transportation Needs: Unmet Transportation Needs (10/13/2017)  Alcohol Screen: Low Risk  (01/09/2017)  Financial Resource Strain: Low Risk  (10/13/2017)  Physical Activity: Inactive (10/13/2017)  Social Connections: Somewhat Isolated (10/13/2017)  Stress: No Stress Concern Present (10/13/2017)  Tobacco Use: High Risk (07/04/2022)     Readmission Risk Interventions     No data to display

## 2022-07-05 LAB — CULTURE, BLOOD (ROUTINE X 2): Culture: NO GROWTH

## 2022-07-06 LAB — CULTURE, BLOOD (ROUTINE X 2)
Culture: NO GROWTH
Special Requests: ADEQUATE

## 2022-07-07 LAB — CULTURE, BLOOD (ROUTINE X 2)
Culture: NO GROWTH
Culture: NO GROWTH

## 2022-07-07 LAB — HCV AB W REFLEX TO QUANT PCR: HCV Ab: NONREACTIVE

## 2022-07-07 LAB — HCV INTERPRETATION

## 2022-07-11 ENCOUNTER — Other Ambulatory Visit: Payer: Self-pay

## 2022-07-11 ENCOUNTER — Emergency Department
Admission: EM | Admit: 2022-07-11 | Discharge: 2022-07-12 | Disposition: A | Discharge status: Other Acute Inpatient Hospital | Payer: No Typology Code available for payment source | Attending: Emergency Medicine | Admitting: Emergency Medicine

## 2022-07-11 DIAGNOSIS — F1410 Cocaine abuse, uncomplicated: Secondary | ICD-10-CM | POA: Diagnosis not present

## 2022-07-11 DIAGNOSIS — Z794 Long term (current) use of insulin: Secondary | ICD-10-CM | POA: Diagnosis not present

## 2022-07-11 DIAGNOSIS — F1020 Alcohol dependence, uncomplicated: Secondary | ICD-10-CM | POA: Diagnosis not present

## 2022-07-11 DIAGNOSIS — F333 Major depressive disorder, recurrent, severe with psychotic symptoms: Secondary | ICD-10-CM

## 2022-07-11 DIAGNOSIS — E1165 Type 2 diabetes mellitus with hyperglycemia: Secondary | ICD-10-CM | POA: Diagnosis not present

## 2022-07-11 DIAGNOSIS — F17200 Nicotine dependence, unspecified, uncomplicated: Secondary | ICD-10-CM | POA: Insufficient documentation

## 2022-07-11 DIAGNOSIS — F1420 Cocaine dependence, uncomplicated: Secondary | ICD-10-CM | POA: Insufficient documentation

## 2022-07-11 DIAGNOSIS — F329 Major depressive disorder, single episode, unspecified: Secondary | ICD-10-CM | POA: Diagnosis present

## 2022-07-11 DIAGNOSIS — F209 Schizophrenia, unspecified: Secondary | ICD-10-CM | POA: Diagnosis not present

## 2022-07-11 DIAGNOSIS — R45851 Suicidal ideations: Secondary | ICD-10-CM

## 2022-07-11 DIAGNOSIS — I10 Essential (primary) hypertension: Secondary | ICD-10-CM | POA: Diagnosis not present

## 2022-07-11 DIAGNOSIS — E119 Type 2 diabetes mellitus without complications: Secondary | ICD-10-CM

## 2022-07-11 DIAGNOSIS — R440 Auditory hallucinations: Secondary | ICD-10-CM

## 2022-07-11 LAB — CBC
HCT: 40 % (ref 39.0–52.0)
Hemoglobin: 13.1 g/dL (ref 13.0–17.0)
MCH: 31.6 pg (ref 26.0–34.0)
MCHC: 32.8 g/dL (ref 30.0–36.0)
MCV: 96.4 fL (ref 80.0–100.0)
Platelets: 326 10*3/uL (ref 150–400)
RBC: 4.15 MIL/uL — ABNORMAL LOW (ref 4.22–5.81)
RDW: 13.7 % (ref 11.5–15.5)
WBC: 5.9 10*3/uL (ref 4.0–10.5)
nRBC: 0 % (ref 0.0–0.2)

## 2022-07-11 LAB — COMPREHENSIVE METABOLIC PANEL
ALT: 20 U/L (ref 0–44)
AST: 20 U/L (ref 15–41)
Albumin: 4.3 g/dL (ref 3.5–5.0)
Alkaline Phosphatase: 77 U/L (ref 38–126)
Anion gap: 7 (ref 5–15)
BUN: 18 mg/dL (ref 6–20)
CO2: 29 mmol/L (ref 22–32)
Calcium: 9.3 mg/dL (ref 8.9–10.3)
Chloride: 100 mmol/L (ref 98–111)
Creatinine, Ser: 0.98 mg/dL (ref 0.61–1.24)
GFR, Estimated: >60 mL/min (ref >60)
Glucose, Bld: 298 mg/dL — ABNORMAL HIGH (ref 70–99)
Potassium: 3.2 mmol/L — ABNORMAL LOW (ref 3.5–5.1)
Sodium: 136 mmol/L (ref 135–145)
Total Bilirubin: 0.7 mg/dL (ref 0.3–1.2)
Total Protein: 7.4 g/dL (ref 6.5–8.1)

## 2022-07-11 LAB — URINE DRUG SCREEN, QUALITATIVE (ARMC ONLY)
Amphetamines, Ur Screen: NOT DETECTED
Barbiturates, Ur Screen: NOT DETECTED
Benzodiazepine, Ur Scrn: NOT DETECTED
Cannabinoid 50 Ng, Ur ~~LOC~~: NOT DETECTED
Cocaine Metabolite,Ur ~~LOC~~: POSITIVE — AB
MDMA (Ecstasy)Ur Screen: NOT DETECTED
Methadone Scn, Ur: NOT DETECTED
Opiate, Ur Screen: NOT DETECTED
Phencyclidine (PCP) Ur S: NOT DETECTED
Tricyclic, Ur Screen: NOT DETECTED

## 2022-07-11 LAB — ACETAMINOPHEN LEVEL: Acetaminophen (Tylenol), Serum: <10 ug/mL — ABNORMAL LOW (ref 10–30)

## 2022-07-11 LAB — SALICYLATE LEVEL: Salicylate Lvl: <7 mg/dL — ABNORMAL LOW (ref 7.0–30.0)

## 2022-07-11 LAB — ETHANOL: Alcohol, Ethyl (B): <10 mg/dL (ref <10)

## 2022-07-11 NOTE — ED Notes (Addendum)
Pt dressed out into hospital provided psych scrubs by this EDT. Pts belongings placed in a labeled pt belonging bag. Pts belongings include:  Red lighter Therapist, music phone (no case, cracks in phone screen prior to pt arrival to ED) Black tennis shoes Blue boxers Blue pants [employer] shirt [employer] Wooden cane Black socks  Pt denied bring a wallet or keys. Pt stated her "Left them at home."

## 2022-07-11 NOTE — ED Notes (Signed)
Pt received evening snack with fresh ice water in a foam cup. Pt made aware no other food would be handed out unil breakfast tomorrow morning.  Pt provided with multiple warm blankets for comfort and bed positioned to pts comfort.

## 2022-07-11 NOTE — BH Assessment (Signed)
Comprehensive Clinical Assessment (CCA) Note  07/11/2022 Forestine Chute [id number]  Chief Complaint:  Chief Complaint  Patient presents with   auditory hallucinations   mental health eval   Suicidal   Visit Diagnosis:   Per Chart:  Hallucinations  Flowsheet Row ED from 07/11/2022 in [facility] Department [facility] ED to Hosp-Admission (Discharged) from 07/02/2022 in [facility] REGIONAL CARDIAC MED PCU ED to Hosp-Admission (Discharged) from 06/29/2022 in [facility]IONAL CARDIAC MED PCU  C-SSRS RISK CATEGORY High Risk No Risk No Risk      The patient demonstrates the following risk factors for suicide: Chronic risk factors for suicide include: psychiatric disorder of hallucinations, substance use disorder, previous self-harm slit his wrist, and medical illness blood clots . Acute risk factors for suicide include: unemployment, social withdrawal/isolation, and loss (financial, interpersonal, professional). Protective factors for this patient include: positive social support, positive therapeutic relationship, coping skills, hope for the future, and life satisfaction. Considering these factors, the overall suicide risk at this point appears to be high. Patient is not appropriate for outpatient follow up.  Disposition: R. Dixon NP, recommends inpatient treatment.  [facility] AC contacted and bed availability under review.  Disposition discussed with Farley Ly.  RN to discuss with EDP.  Henry Munoz is a 57 year old male who presents voluntarily to [facility] via Patent examiner and unaccompanied.  Pt reports SI with no plan, "I have thought about hurting myself".  Pt reports hearing two voices, "the voices tell me to kill myself, you are not worth sh_t".  Pt reports seeing snakes and dead people, " can't do this no more".  Pt denies HI.  Pt reports prior self harm by cutting his wrist, when he was a teenager. Pt reports he feels paranoia all the time.  Pt acknowledged the following  symptoms: irritable, anxious, worthlessness, hopelessness, suspiciousness, poor judgement and fatigue. Pt reports that he is sleeping two or three hours during the night.  Pt reports eating three meals.  Pt reports smoking crack cocaine daily, "I use $200.00 dollars a day".  Pt reports he smoke a pack of cigarettes every three days.  Pt denies using any other substance use.  Pt identifies his primary stressors as with "I can't do anything for myself, I can't pay a bill, I don't work, I had two strokes, which cause me to feel bad about myself and I smoke cocaine".  Pt reports he live with his brother in his mother's house.  Pt reports no family history of mental illness; also, reports no family history of substance used.  Pt denies any current legal problems.  Pt denies any guns or weapons in the home.  Pt says he is not currently receiving weekly outpatient therapy; also, reports he is not currently taking prescribed medication management, "I have not taking any medication in two years".  Pt is dressed in scrubs, alert, oriented x 4 with normal speech and restless motor behavior.  Eye contact is good.  Pt mood is hopeless and worthless.  Pt affect is congruent.  Thought process relevant.  Pt's insight is fair and judgment is impaired.  There is no indication Pt is currently responding to internal stimuli or experiencing delusional thought content.  Pt was cooperative throughout assessment.    CCA Screening, Triage and Referral (STR)  Patient Reported Information How did you hear about [place]? Legal System (BIB by [facility])  What Is the Reason for Your Visit/Call Today? SI, AVH, Depression  How Long Has This  Been Causing You Problems? 1-6 months  What Do You Feel Would Help You the Most Today? Alcohol or Drug Use Treatment; Treatment for Depression or other mood problem   Have You Recently Had Any Thoughts About Hurting Yourself? Yes  Are You Planning to Commit Suicide/Harm Yourself At This time?  No   Flowsheet Row ED from 07/11/2022 in [facility] Department at [facility] ED to Hosp-Admission (Discharged) from 07/02/2022 in [facility]ARDIAC MED PCU ED to Hosp-Admission (Discharged) from 06/29/2022 in [facility]IONAL CARD[facility] MED PCU  C-SSRS RISK CATEGORY High Risk No Risk No Risk       Have you Recently Had Thoughts About Hurting Someone Karolee Ohs? No  Are You Planning to Harm Someone at This Time? No  Explanation: n/a   Have You Used Any Alcohol or Drugs in the Past 24 Hours? Yes  What Did You Use and How Much? Crack Cocaine, Pt reports $200 dollars day   Do You Currently Have a Therapist/Psychiatrist? No  Name of Therapist/Psychiatrist: Name of Therapist/Psychiatrist: n/a   Have You Been Recently Discharged From Any Office Practice or Programs? No  Explanation of Discharge From Practice/Program: n/a     CCA Screening Triage Referral Assessment Type of Contact: Face-to-Face  Telemedicine Service Delivery:   Is this Initial or Reassessment?   Date Telepsych consult ordered in [facility]:    Time Telepsych consult ordered in [facility]:    Location of Assessment: [facility] ED  Provider Location: [facility] ED   Collateral Involvement: No collateral involved.   Does Patient Have a Automotive engineer Guardian? -- (n/a)  Legal Guardian Contact Information: n/a  Copy of Legal Guardianship Form: -- (n/a)  Legal Guardian Notified of Arrival: -- (n/a)  Legal Guardian Notified of Pending Discharge: -- (n/a)  If Minor and Not Living with Parent(s), Who has Custody? n/a  Is CPS involved or ever been involved? -- (n/a)  Is APS involved or ever been involved? -- (n/a)   Patient Determined To Be At Risk for Harm To Self or Others Based on Review of Patient Reported Information or Presenting Complaint? Yes, for Self-Harm  Method: No Plan  Availability of Means: No access or NA  Intent: Vague intent or NA  Notification Required: No need or identified  person  Additional Information for Danger to Others Potential: -- (n/a)  Additional Comments for Danger to Others Potential: n/a  Are There Guns or Other Weapons in Your Home? No  Types of Guns/Weapons: n/a  Are These Weapons Safely Secured?                            -- (n/a)  Who Could Verify You Are Able To Have These Secured: n/a  Do You Have any Outstanding Charges, Pending Court Dates, Parole/Probation? No  Contacted To Inform of Risk of Harm To Self or Others: Law Enforcement    Does Patient Present under Involuntary Commitment? No    [place] of Residence: [place]   Patient Currently Receiving the Following Services: Not Receiving Services   Determination of Need: Urgent (48 hours)   Options For Referral: Inpatient Hospitalization     CCA Biopsychosocial Patient Reported Schizophrenia/Schizoaffective Diagnosis in Past: Yes   Strengths: Pt asking for help with substance used.   Mental Health Symptoms Depression:   Difficulty Concentrating; Hopelessness; Worthlessness   Duration of Depressive symptoms:  Duration of Depressive Symptoms: Greater than two weeks   Mania:   None  Anxiety:    Irritability; Restlessness; Tension; Worrying   Psychosis:   Hallucinations   Duration of Psychotic symptoms:  Duration of Psychotic Symptoms: Less than six months   Trauma:   None   Obsessions:   Disrupts routine/functioning; Recurrent & persistent thoughts/impulses/images   Compulsions:   Disrupts with routine/functioning; "Driven" to perform behaviors/acts; Repeated behaviors/mental acts   Inattention:   Avoids/dislikes activities that require focus   Hyperactivity/Impulsivity:   None   Oppositional/Defiant Behaviors:   None   Emotional Irregularity:   Chronic feelings of emptiness; Potentially harmful impulsivity; Recurrent suicidal behaviors/gestures/threats   Other Mood/Personality Symptoms:   Anxious/Irritable    Mental Status  Exam Appearance and self-care  Stature:   Average   Weight:   Obese   Clothing:   -- (Pt dressed in scrubs.)   Grooming:   Normal   Cosmetic use:   Age appropriate   Posture/gait:   Normal   Motor activity:   Agitated; Restless   Sensorium  Attention:   Normal   Concentration:   Normal   Orientation:   Object; Person; Place; Situation   Recall/memory:   Normal   Affect and Mood  Affect:   Congruent   Mood:   Hopeless; Worthless; Depressed   Relating  Eye contact:   Normal   Facial expression:   Angry   Attitude toward examiner:   Cooperative; Guarded   Thought and Language  Speech flow:  Clear and Coherent   Thought content:   Appropriate to Mood and Circumstances   Preoccupation:   None   Hallucinations:   Visual; Auditory   Organization:   Coherent   [employer] of Knowledge:   Average   Intelligence:   Average   Abstraction:   Functional   Judgement:   Impaired; Poor   Reality Testing:   Realistic   Insight:   Fair; Lacking   Decision Making:   Impulsive; Only simple   Social Functioning  Social Maturity:   Impulsive; Irresponsible   Social Judgement:   "Garment/textile technologist   Stress  Stressors:   Work; Transitions   Coping Ability:   Deficient supports   Skill Deficits:   Building services engineer; Self-control   Supports:  No data recorded    Religion: Religion/Spirituality Are You A Religious Person?: Yes What is Your Religious Affiliation?: Christian How Might This Affect Treatment?: not assessed  Leisure/Recreation: Leisure / Recreation Do You Have Hobbies?: Yes Leisure and Hobbies: Boxing  Exercise/Diet: Exercise/Diet Do You Exercise?: Yes What Type of Exercise Do You Do?: Run/Walk How Many Times a Week Do You Exercise?: 1-3 times a week Have You Gained or Lost A Significant Amount of Weight in the Past Six Months?: No Do You Follow a Special Diet?: No Do You Have Any  Trouble Sleeping?: Yes Explanation of Sleeping Difficulties: Pt reprots he sleeps two hours during the night.   CCA Employment/Education Employment/Work Situation: Employment / Work Situation Employment Situation: Unemployed Patient's Job has Been Impacted by Current Illness: No Has Patient ever Been in Equities trader?: No  Education: Education Is Patient Currently Attending School?: No Last Grade Completed: 12 Did You Product manager?: No Did You Have An Individualized Education Program (IIEP): No Did You Have Any Difficulty At [employer]?: No Patient's Education Has Been Impacted by Current Illness: No   CCA Family/Childhood History Family and Relationship History: Family history Marital status: Divorced Divorced, when?: Pt did not provide information What types of issues is patient dealing with in  the relationship?: Pt did not provide information Additional relationship information: n/a Does patient have children?: Yes (Unknown) How many children?: 5 How is patient's relationship with their children?: close  Childhood History:  Childhood History By whom was/is the patient raised?: Both parents Did patient suffer any verbal/emotional/physical/sexual abuse as a child?: No Did patient suffer from severe childhood neglect?: No Has patient ever been sexually abused/assaulted/raped as an adolescent or adult?: No Was the patient ever a victim of a crime or a disaster?: No Witnessed domestic violence?: No Has patient been affected by domestic violence as an adult?: No       CCA Substance Use Alcohol/Drug Use: Alcohol / Drug Use Pain Medications: See MRA Prescriptions: See MRA Over the Counter: See MRA History of alcohol / drug use?: Yes Longest period of sobriety (when/how long): Pt reports Sobriety when he was incarcerate during 2017- 2022 Negative Consequences of Use: Financial, Personal relationships Withdrawal Symptoms: Agitation (None endorsed) Substance #1 Name of  Substance 1: Crack Cocaine 1 - Age of First Use: 53 1 - Amount (size/oz): $200.00 dollars a day 1 - Frequency: daily 1 - Duration: onging 1 - Last Use / Amount: 07/11/22 1 - Method of Aquiring: UTA 1- Route of Use: Smoking                       ASAM's:  Six Dimensions of Multidimensional Assessment  Dimension 1:  Acute Intoxication and/or Withdrawal Potential:   Dimension 1:  Description of individual's past and current experiences of substance use and withdrawal: Pt reports that he started smoking crack cocaine at the age of 34 when he was release from prison, "I want to stop using crack cocaine".  Dimension 2:  Biomedical Conditions and Complications:   Dimension 2:  Description of patient's biomedical conditions and  complications: Pt reports he had two Strokes  Dimension 3:  Emotional, Behavioral, or Cognitive Conditions and Complications:  Dimension 3:  Description of emotional, behavioral, or cognitive conditions and complications: Schizophrenia, Bipolar  Dimension 4:  Readiness to Change:  Dimension 4:  Description of Readiness to Change criteria: contemplation  Dimension 5:  Relapse, Continued use, or Continued Problem Potential:  Dimension 5:  Relapse, continued use, or continued problem potential critiera description: continue use  Dimension 6:  Recovery/Living Environment:  Dimension 6:  Recovery/Iiving environment criteria description: Pt reports that he live with his brother in his mother's house  ASAM Severity Score: ASAM's Severity Rating Score: 15  ASAM Recommended Level of Treatment: ASAM Recommended Level of Treatment: Level II Intensive Outpatient Treatment   Substance use Disorder (SUD) Substance Use Disorder (SUD)  Checklist Symptoms of Substance Use: Continued use despite having a persistent/recurrent physical/psychological problem caused/exacerbated by use, Continued use despite persistent or recurrent social, interpersonal problems, caused or exacerbated by  use, Evidence of tolerance, Large amounts of time spent to obtain, use or recover from the substance(s), Presence of craving or strong urge to use, Recurrent use that results in a failure to fulfill major role obligations (work, school, home), Repeated use in physically hazardous situations, Substance(s) often taken in larger amounts or over longer times than was intended  Recommendations for Services/Supports/Treatments: Recommendations for Services/Supports/Treatments Recommendations For Services/Supports/Treatments: Inpatient Hospitalization  Discharge Disposition:    DSM5 Diagnoses: Patient Active Problem List   Diagnosis Date Noted   Cocaine use 07/04/2022   Schizophrenia (HCC) 07/03/2022   PE (pulmonary thromboembolism) (HCC) 07/02/2022   Positive blood culture 06/30/2022   Cocaine abuse (HCC) 06/30/2022  Uncontrolled type 2 diabetes mellitus with hyperglycemia, with long-term current use of insulin (HCC) 06/30/2022   Pseudohyponatremia 06/30/2022   AKI (acute kidney injury) (HCC) 06/30/2022   Hyperkalemia 06/30/2022   IV infiltration, initial encounter 06/30/2022   Pulmonary embolus (HCC) 06/29/2022   Neuropathy, arm, left 10/15/2017   Acute encephalopathy 10/10/2017   Opiate abuse, episodic (HCC) 10/10/2017   MDD (major depressive disorder) 08/12/2016   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

## 2022-07-11 NOTE — ED Triage Notes (Signed)
Pt to ED with police (he called them to bring to ED) from home for auditory and visual hallucinations, states "I can't take it no more" and states this started last week when he started smoking crack again. States is seeing "dead people" and hearing voices telling him to hurt self and to "take things from other people". Last time smoked crack was yesterday, no other drugs or alcohol. Endorses SI. Pt is calm and cooperative and in NAD.  Pt states has hx bipolar, schizophrenia. States has been out of psych meds for "awhile". States also takes Eliquis and BP meds.

## 2022-07-11 NOTE — Consult Note (Signed)
[facility] Face-to-Face Psychiatry Consult   Reason for Consult:  Psychiatric Evaluation  Referring Physician:  Dr. Vicente Males Patient Identification: Henry Munoz MRN:  [id number] Principal Diagnosis: MDD (major depressive disorder) Diagnosis:  Principal Problem:   MDD (major depressive disorder) Active Problems:   Essential hypertension   Diabetes (HCC)   Tobacco use disorder   Cocaine use disorder, severe, dependence (HCC)   Alcohol use disorder, moderate, dependence (HCC)   Uncontrolled type 2 diabetes mellitus with hyperglycemia, with long-term current use of insulin (HCC)   Schizophrenia (HCC)   Total Time spent with patient: 45 minutes  Subjective:   " I'm in a bad place"  HPI:  Henry Munoz, 57 y.o., male patient seen  by this provider; chart reviewed and consulted with Dr. Vicente Males, TTS and nursing staff on 07/11/22.  On evaluation Henry Munoz reports  that lately he has been hearing voices that tells him that he worthless and to kill himself.  He also endorses visual hallucinations but was vague on the description.  He reports having a history of schizophrenia and being on seroquel while in prison.  He states he has not been on his meds since being released (2 years) but admits to smoking crack as recently as yesterday and other drug use.  As of toady he states that he wants help with his drug addiction as well as getting back on his medication.    Per TTS, Henry Munoz is a 57 year old male who presents voluntarily to [facility] via [facility] and unaccompanied.  Pt reports SI with no plan, "I have thought about hurting myself".  Pt reports hearing two voices, "the voices tell me to kill myself, you are not worth sh_t".  Pt reports seeing snakes and dead people, " can't do this no more".  Pt denies HI.  Pt reports prior self harm by cutting his wrist, when he was a teenager. Pt reports he feels paranoia all the time.  Pt acknowledged the following symptoms: irritable,  anxious, worthlessness, hopelessness, suspiciousness, poor judgement and fatigue. Pt reports that he is sleeping two or three hours during the night.  Pt reports eating three meals.  Pt reports smoking crack cocaine daily, "I use $200.00 dollars a day".  Pt reports he smoke a pack of cigarettes every three days.  Pt denies using any other substance use.   Pt identifies his primary stressors as with "I can't do anything for myself, I can't pay a bill, I don't work, I had two strokes, which cause me to feel bad about myself and I smoke cocaine".  Pt reports he live with his brother in his mother's house.  Pt reports no family history of mental illness; also, reports no family history of substance used.  Pt denies any current legal problems.  Pt denies any guns or weapons in the home.   Pt says he is not currently receiving weekly outpatient therapy; also, reports he is not currently taking prescribed medication management, "I have not taking any medication in two years".   Pt is dressed in scrubs, alert, oriented x 4 with normal speech and restless motor behavior.  Eye contact is good.  Pt mood is hopeless and worthless.  Pt affect is congruent.  Thought process relevant.  Pt's insight is fair and judgment is impaired.  There is no indication Pt is currently responding to internal stimuli or experiencing delusional thought content.  Pt was cooperative throughout assessment.  Recommendation:  Inpatient psychiatric hospitalization  Past Psychiatric History: schizophrenia   Risk to Self:  yes Risk to Others:  no Prior Inpatient Therapy:  yes Prior Outpatient Therapy:    Past Medical History:  Past Medical History:  Diagnosis Date   Alcohol abuse    Cocaine abuse (HCC)    Diabetes mellitus without complication (HCC)    Diastolic dysfunction    a. 06/2022 Echo (in setting of acute PE):  EF 50-55%, no rwma, mod-sev LVH, grI DD, mod reduced RV fxn, triv MR w/ MVP of post leaflet, ? bicuspid AoV w/  triv AI and Ao sclerosis.   Hypertension    Pulmonary embolism (HCC)    a. 06/2022 CTA: Large volume PE affecting all lobar braches w/ R heart strain-->s/p mech thrombectomy.   Schizo-affective schizophrenia (HCC)    Stroke [facility])    TIA (transient ischemic attack)    Tobacco abuse     Past Surgical History:  Procedure Laterality Date   PULMONARY THROMBECTOMY Bilateral 06/30/2022   Procedure: PULMONARY THROMBECTOMY;  Surgeon: Renford Dills, MD;  Location: [facility] INVASIVE CV LAB;  Service: Cardiovascular;  Laterality: Bilateral;   Family History:  Family History  Problem Relation Age of Onset   High blood pressure Father    Diabetes Father    Renal Disease Father    Family Psychiatric  History:  Social History:  Social History   Substance and Sexual Activity  Alcohol Use Yes   Alcohol/week: 2.0 standard drinks of alcohol   Types: 2 Cans of beer per week   Comment: per week. 07/11/22 states does not drink     Social History   Substance and Sexual Activity  Drug Use Yes   Types: Cocaine   Comment: Uses weekly. also crack    Social History   Socioeconomic History   Marital status: Single    Spouse name: Not on file   Number of children: Not on file   Years of education: Not on file   Highest education level: Not on file  Occupational History   Not on file  Tobacco Use   Smoking status: Every Day    Packs/day: 0.50    Years: 15.00    Additional pack years: 0.00    Total pack years: 7.50    Types: Cigarettes   Smokeless tobacco: Never  Substance and Sexual Activity   Alcohol use: Yes    Alcohol/week: 2.0 standard drinks of alcohol    Types: 2 Cans of beer per week    Comment: per week. 07/11/22 states does not drink   Drug use: Yes    Types: Cocaine    Comment: Uses weekly. also crack   Sexual activity: Not on file  Other Topics Concern   Not on file  Social History Narrative   Lives locally.  Does not routinely exercise.   Social Determinants of Health    Financial Resource Strain: Low Risk  (10/13/2017)   Overall Financial Resource Strain (CARDIA)    Difficulty of Paying Living Expenses: Not hard at all  Food Insecurity: No Food Insecurity (10/13/2017)   Hunger Vital Sign    Worried About Running Out of Food in the Last Year: Never true    Ran Out of Food in the Last Year: Never true  Transportation Needs: Unmet Transportation Needs (07/04/2022)   PRAPARE - Transportation    Lack of Transportation (Medical): Yes    Lack of Transportation (Non-Medical): Yes  Physical Activity: Inactive (10/13/2017)   Exercise Vital Sign    Days of  Exercise per Week: 0 days    Minutes of Exercise per Session: 0 min  Stress: No Stress Concern Present (10/13/2017)   [employer] of Occupational Health - Occupational Stress Questionnaire    Feeling of Stress : Not at all  Social Connections: Somewhat Isolated (10/13/2017)   Social Connection and Isolation Panel [NHANES]    Frequency of Communication with Friends and Family: Three times a week    Frequency of Social Gatherings with Friends and Family: Never    Attends Religious Services: 1 to 4 times per year    Active Member of [employer] or Organizations: No    Attends Banker Meetings: Never    Marital Status: Separated   Additional Social History:    Allergies:   Allergies  Allergen Reactions   Haldol [Haloperidol Lactate]     Labs:  Results for orders placed or performed during the hospital encounter of 07/11/22 (from the past 48 hour(s))  Urine Drug Screen, Qualitative     Status: Abnormal   Collection Time: 07/11/22  2:56 PM  Result Value Ref Range   Tricyclic, Ur Screen NONE DETECTED NONE DETECTED   Amphetamines, Ur Screen NONE DETECTED NONE DETECTED   MDMA (Ecstasy)Ur Screen NONE DETECTED NONE DETECTED   Cocaine Metabolite,Ur [place] POSITIVE (A) NONE DETECTED   Opiate, Ur Screen NONE DETECTED NONE DETECTED   Phencyclidine (PCP) Ur S NONE DETECTED NONE DETECTED   Cannabinoid 50 Ng,  Ur [place] NONE DETECTED NONE DETECTED   Barbiturates, Ur Screen NONE DETECTED NONE DETECTED   Benzodiazepine, Ur Scrn NONE DETECTED NONE DETECTED   Methadone Scn, Ur NONE DETECTED NONE DETECTED    Comment: (NOTE) Tricyclics + metabolites, urine    Cutoff 1000 ng/mL Amphetamines + metabolites, urine  Cutoff 1000 ng/mL MDMA (Ecstasy), urine              Cutoff 500 ng/mL Cocaine Metabolite, urine          Cutoff 300 ng/mL Opiate + metabolites, urine        Cutoff 300 ng/mL Phencyclidine (PCP), urine         Cutoff 25 ng/mL Cannabinoid, urine                 Cutoff 50 ng/mL Barbiturates + metabolites, urine  Cutoff 200 ng/mL Benzodiazepine, urine              Cutoff 200 ng/mL Methadone, urine                   Cutoff 300 ng/mL  The urine drug screen provides only a preliminary, unconfirmed analytical test result and should not be used for non-medical purposes. Clinical consideration and professional judgment should be applied to any positive drug screen result due to possible interfering substances. A more specific alternate chemical method must be used in order to obtain a confirmed analytical result. Gas chromatography / mass spectrometry (GC/MS) is the preferred confirm atory method. Performed at [facility], [address] [place], [place] [address]   Comprehensive metabolic panel     Status: Abnormal   Collection Time: 07/11/22  2:57 PM  Result Value Ref Range   Sodium 136 135 - 145 mmol/L   Potassium 3.2 (L) 3.5 - 5.1 mmol/L   Chloride 100 98 - 111 mmol/L   CO2 29 22 - 32 mmol/L   Glucose, Bld 298 (H) 70 - 99 mg/dL    Comment: Glucose reference range applies only to samples taken after fasting for at  least 8 hours.   BUN 18 6 - 20 mg/dL   Creatinine, Ser 1.61 0.61 - 1.24 mg/dL   Calcium 9.3 8.9 - 09.6 mg/dL   Total Protein 7.4 6.5 - 8.1 g/dL   Albumin 4.3 3.5 - 5.0 g/dL   AST 20 15 - 41 U/L   ALT 20 0 - 44 U/L   Alkaline Phosphatase 77 38 - 126 U/L   Total  Bilirubin 0.7 0.3 - 1.2 mg/dL   GFR, Estimated >04 >54 mL/min    Comment: (NOTE) Calculated using the CKD-EPI Creatinine Equation (2021)    Anion gap 7 5 - 15    Comment: Performed at [facility], [address] [place], [place] [address]  Ethanol     Status: None   Collection Time: 07/11/22  2:57 PM  Result Value Ref Range   Alcohol, Ethyl (B) <10 <10 mg/dL    Comment: (NOTE) Lowest detectable limit for serum alcohol is 10 mg/dL.  For medical purposes only. Performed at [facility], [address] [place], [place] [address]   Salicylate level     Status: Abnormal   Collection Time: 07/11/22  2:57 PM  Result Value Ref Range   Salicylate Lvl <7.0 (L) 7.0 - 30.0 mg/dL    Comment: Performed at [facility], [address] [place], [place] [address]  Acetaminophen level     Status: Abnormal   Collection Time: 07/11/22  2:57 PM  Result Value Ref Range   Acetaminophen (Tylenol), Serum <10 (L) 10 - 30 ug/mL    Comment: (NOTE) Therapeutic concentrations vary significantly. A range of 10-30 ug/mL  may be an effective concentration for many patients. However, some  are best treated at concentrations outside of this range. Acetaminophen concentrations >150 ug/mL at 4 hours after ingestion  and >50 ug/mL at 12 hours after ingestion are often associated with  toxic reactions.  Performed at [facility], [address] [place], [place] [address]   cbc     Status: Abnormal   Collection Time: 07/11/22  2:57 PM  Result Value Ref Range   WBC 5.9 4.0 - 10.5 K/uL   RBC 4.15 (L) 4.22 - 5.81 MIL/uL   Hemoglobin 13.1 13.0 - 17.0 g/dL   HCT 57.8 46.9 - 62.9 %   MCV 96.4 80.0 - 100.0 fL   MCH 31.6 26.0 - 34.0 pg   MCHC 32.8 30.0 - 36.0 g/dL   RDW 52.8 41.3 - 24.4 %   Platelets 326 150 - 400 K/uL   nRBC 0.0 0.0 - 0.2 %    Comment: Performed at [facility], [address] [place], [place] [address]    No current  facility-administered medications for this encounter.   Current Outpatient Medications  Medication Sig Dispense Refill   amLODipine (NORVASC) 2.5 MG tablet Take 1 tablet (2.5 mg total) by mouth daily. 30 tablet 3   apixaban (ELIQUIS) 5 MG TABS tablet Take 2 tablets (10 mg total) by mouth 2 (two) times daily. And then from 07/10/22 take 5 mg two times daily 60 tablet 3   atorvastatin (LIPITOR) 40 MG tablet Take 1 tablet (40 mg total) by mouth daily. 30 tablet 3   insulin aspart (NOVOLOG) 100 UNIT/ML FlexPen If eating and Blood Glucose (BG) 80 or higher inject 4 units for meal coverage and add correction dose per scale. If not eating, correction dose only. BG <150= 0 unit; BG 150-200= 1 unit; BG 201-250= 2 unit; BG 251-300= 3  unit; BG 301-350= 4 unit; BG 351-400= 5 unit; BG >400= 6 unit and Call Primary care. 15 mL 0   insulin glargine (LANTUS) 100 UNIT/ML Solostar Pen Inject 20 Units into the skin daily. May substitute as needed per insurance. 15 mL 0   Blood Glucose Monitoring Suppl DEVI 1 each by Does not apply route 3 (three) times daily. May dispense any manufacturer covered by patient's insurance. (Patient not taking: Reported on 07/11/2022) 1 each 0   clotrimazole (LOTRIMIN) 1 % cream Apply 1 Application topically 2 (two) times daily. (Patient not taking: Reported on 07/11/2022) 30 g 0   Glucose Blood (BLOOD GLUCOSE TEST STRIPS) STRP 1 each by Does not apply route 3 (three) times daily. Use as directed to check blood sugar. May dispense any manufacturer covered by patient's insurance and fits patient's device. (Patient not taking: Reported on 07/11/2022) 100 strip 0   Insulin Pen Needle (PEN NEEDLES) 31G X 5 MM MISC 1 each by Does not apply route 3 (three) times daily. May dispense any manufacturer covered by patient's insurance. (Patient not taking: Reported on 07/11/2022) 100 each 0   Lancet Device MISC 1 each by Does not apply route 3 (three) times daily. May dispense any manufacturer covered by patient's  insurance. (Patient not taking: Reported on 07/11/2022) 1 each 0   Lancets MISC 1 each by Does not apply route 3 (three) times daily. Use as directed to check blood sugar. May dispense any manufacturer covered by patient's insurance and fits patient's device. (Patient not taking: Reported on 07/11/2022) 100 each 0   polyethylene glycol (MIRALAX / GLYCOLAX) 17 g packet Take 17 g by mouth daily as needed for moderate constipation. (Patient not taking: Reported on 07/11/2022) 30 each 0    Musculoskeletal: Strength & Muscle Tone: within normal limits Gait & Station: normal Patient leans: N/A            Psychiatric Specialty Exam:  Presentation  General Appearance:  Appropriate for Environment  Eye Contact: Fair  Speech: Clear and Coherent  Speech Volume: Decreased  Handedness: Right   Mood and Affect  Mood: Depressed; Hopeless; Worthless  Affect: Inappropriate; Depressed   Thought Process  Thought Processes: Coherent  Descriptions of Associations:Intact  Orientation:Full (Time, Place and Person)  Thought Content:WDL; Logical  History of Schizophrenia/Schizoaffective disorder:Yes  Duration of Psychotic Symptoms:Greater than six months  Hallucinations:Hallucinations: Visual; Auditory  Ideas of Reference:Paranoia  Suicidal Thoughts:Suicidal Thoughts: Yes, Passive SI Active Intent and/or Plan: With Intent  Homicidal Thoughts:Homicidal Thoughts: No   Sensorium  Memory: Immediate Fair  Judgment: Impaired  Insight: Fair   Chartered certified accountant: Fair  Attention Span: Fair  Recall: [employer] of Knowledge: Fair  Language: Fair   Psychomotor Activity  Psychomotor Activity: Psychomotor Activity: Normal   Assets  Assets: Manufacturing systems engineer; Desire for Improvement; Housing; Social Support   Sleep  Sleep: Sleep: Fair   Physical Exam: Physical Exam Vitals and nursing note reviewed.  Constitutional:       Appearance: Normal appearance.  HENT:     Head: Normocephalic and atraumatic.     Nose: Nose normal.  Eyes:     Pupils: Pupils are equal, round, and reactive to light.  Pulmonary:     Effort: Pulmonary effort is normal.  Musculoskeletal:        General: Normal range of motion.     Cervical back: Normal range of motion.  Skin:    General: Skin is warm and dry.  Neurological:  General: No focal deficit present.     Mental Status: He is oriented to person, place, and time.  Psychiatric:        Attention and Perception: Attention and perception normal.        Mood and Affect: Mood is anxious and depressed.        Speech: Speech is delayed.        Behavior: Behavior is slowed. Behavior is cooperative.        Thought Content: Thought content includes suicidal ideation.        Cognition and Memory: Cognition and memory normal.        Judgment: Judgment is impulsive.    Review of Systems  Psychiatric/Behavioral:  Positive for depression, hallucinations, substance abuse and suicidal ideas. Negative for memory loss. The patient is nervous/anxious and has insomnia.   All other systems reviewed and are negative.  Blood pressure (!) 145/102, pulse 87, temperature 98.2 F (36.8 C), temperature source Oral, resp. rate 20, height 5\' 7"  (1.702 m), weight 93 kg, SpO2 96 %. Body mass index is 32.11 kg/m.  Treatment Plan Summary: Daily contact with patient to assess and evaluate symptoms and progress in treatment, Medication management, Plan restart seroquel, and    Disposition: Recommend psychiatric Inpatient admission when medically cleared. Supportive therapy provided about ongoing stressors. Discussed crisis plan, support from social network, calling 911, coming to the Emergency Department, and calling Suicide Hotline.  Jearld Lesch, NP 07/11/2022 10:27 PM

## 2022-07-11 NOTE — ED Provider Notes (Signed)
[facility] Provider Note   Event Date/Time   First MD Initiated Contact with Patient 07/11/22 1829     (approximate) History  auditory hallucinations, mental health eval, and Suicidal  HPI Henry Munoz is a 57 y.o. male with a past medical history of schizoaffective disorder who presents complaining of auditory hallucinations as well as suicidal ideation after starting to smoke crack again.  Patient states that he is not taking his currently prescribed psychiatric medications secondary to payment issues.  Patient also states that he takes Eliquis and blood pressure medications ROS: Patient currently denies any vision changes, tinnitus, difficulty speaking, facial droop, sore throat, chest pain, shortness of breath, abdominal pain, nausea/vomiting/diarrhea, dysuria, or weakness/numbness/paresthesias in any extremity   Physical Exam  Triage Vital Signs: ED Triage Vitals  Enc Vitals Group     BP 07/11/22 1446 (!) 145/102     Pulse Rate 07/11/22 1446 87     Resp 07/11/22 1446 20     Temp 07/11/22 1451 98.2 F (36.8 C)     Temp Source 07/11/22 1446 Oral     SpO2 07/11/22 1446 96 %     Weight 07/11/22 1445 205 lb (93 kg)     Height 07/11/22 1445 5\' 7"  (1.702 m)     Head Circumference --      Peak Flow --      Pain Score 07/11/22 1445 0     Pain Loc --      Pain Edu? --      Excl. in GC? --    Most recent vital signs: Vitals:   07/11/22 1446 07/11/22 1451  BP: (!) 145/102   Pulse: 87   Resp: 20   Temp:  98.2 F (36.8 C)  SpO2: 96%    General: Awake, oriented x4. CV:  Good peripheral perfusion.  Resp:  Normal effort.  Abd:  No distention.  Other:  Middle-aged obese African-American male laying in bed in no acute distress.  Patient calm and cooperative with staff ED Results / Procedures / Treatments  Labs (all labs ordered are listed, but only abnormal results are displayed) Labs Reviewed  COMPREHENSIVE METABOLIC PANEL - Abnormal; Notable for  the following components:      Result Value   Potassium 3.2 (*)    Glucose, Bld 298 (*)    All other components within normal limits  SALICYLATE LEVEL - Abnormal; Notable for the following components:   Salicylate Lvl <7.0 (*)    All other components within normal limits  ACETAMINOPHEN LEVEL - Abnormal; Notable for the following components:   Acetaminophen (Tylenol), Serum <10 (*)    All other components within normal limits  CBC - Abnormal; Notable for the following components:   RBC 4.15 (*)    All other components within normal limits  URINE DRUG SCREEN, QUALITATIVE ([facility] ONLY) - Abnormal; Notable for the following components:   Cocaine Metabolite,Ur [place] POSITIVE (*)    All other components within normal limits  ETHANOL   PROCEDURES: Critical Care performed: No Procedures MEDICATIONS ORDERED IN ED: Medications - No data to display IMPRESSION / MDM / ASSESSMENT AND PLAN / ED COURSE  I reviewed the triage vital signs and the nursing notes.                             The patient is on the cardiac monitor to evaluate for evidence of arrhythmia and/or significant heart rate changes.  Patient's presentation is most consistent with acute presentation with potential threat to life or bodily function. Patient presents under IVC for hallucinations/delusions and suicidal ideation. Thoughts are not disorganized. No history of prior suicide attempt, and no SI or HI at this time. Clinically w/ no overt toxidrome, low suspicion for ingestion given hx and exam Thoughts unlikely 2/2 anemia, hypothyroidism, infection, or ICH. Patients decision making capacity is compromised and they are unable to perform all ADLs (additionally they are without appropriate caretakers to assist through this deficit).  Consult: Psychiatry to evaluate patient for grave disability Disposition: Pending psychiatric evaluation  Care of this patient will be signed out the oncoming physician.  All pertinent patient  formation is conveyed and all questions answered.  All further care and disposition decisions will be made by the oncoming physician.   FINAL CLINICAL IMPRESSION(S) / ED DIAGNOSES   Final diagnoses:  Auditory hallucinations  Cocaine abuse (HCC)  Suicidal ideation   Rx / DC Orders   ED Discharge Orders     None      Note:  This document was prepared using Dragon voice recognition software and may include unintentional dictation errors.   Merwyn Katos, MD 07/11/22 2007

## 2022-07-12 ENCOUNTER — Inpatient Hospital Stay (HOSPITAL_COMMUNITY)
Admission: AD | Admit: 2022-07-12 | Discharge: 2022-07-20 | DRG: 885 | Disposition: A | Discharge status: Home / Self Care | Payer: No Typology Code available for payment source | Attending: Psychiatry | Admitting: Psychiatry

## 2022-07-12 ENCOUNTER — Encounter (HOSPITAL_COMMUNITY): Payer: Self-pay | Admitting: Psychiatric/Mental Health

## 2022-07-12 DIAGNOSIS — Z86711 Personal history of pulmonary embolism: Secondary | ICD-10-CM

## 2022-07-12 DIAGNOSIS — K5900 Constipation, unspecified: Secondary | ICD-10-CM | POA: Diagnosis present

## 2022-07-12 DIAGNOSIS — Z20822 Contact with and (suspected) exposure to covid-19: Secondary | ICD-10-CM | POA: Diagnosis present

## 2022-07-12 DIAGNOSIS — R45851 Suicidal ideations: Secondary | ICD-10-CM | POA: Diagnosis present

## 2022-07-12 DIAGNOSIS — F17210 Nicotine dependence, cigarettes, uncomplicated: Secondary | ICD-10-CM | POA: Diagnosis present

## 2022-07-12 DIAGNOSIS — E119 Type 2 diabetes mellitus without complications: Secondary | ICD-10-CM | POA: Diagnosis present

## 2022-07-12 DIAGNOSIS — I10 Essential (primary) hypertension: Secondary | ICD-10-CM | POA: Diagnosis present

## 2022-07-12 DIAGNOSIS — Z560 Unemployment, unspecified: Secondary | ICD-10-CM | POA: Diagnosis not present

## 2022-07-12 DIAGNOSIS — Z91148 Patient's other noncompliance with medication regimen for other reason: Secondary | ICD-10-CM

## 2022-07-12 DIAGNOSIS — F200 Paranoid schizophrenia: Secondary | ICD-10-CM | POA: Diagnosis present

## 2022-07-12 DIAGNOSIS — F209 Schizophrenia, unspecified: Principal | ICD-10-CM | POA: Diagnosis present

## 2022-07-12 DIAGNOSIS — G4700 Insomnia, unspecified: Secondary | ICD-10-CM | POA: Diagnosis present

## 2022-07-12 DIAGNOSIS — Z8673 Personal history of transient ischemic attack (TIA), and cerebral infarction without residual deficits: Secondary | ICD-10-CM | POA: Diagnosis not present

## 2022-07-12 LAB — GLUCOSE, CAPILLARY
Glucose-Capillary: 302 mg/dL — ABNORMAL HIGH (ref 70–99)
Glucose-Capillary: 404 mg/dL — ABNORMAL HIGH (ref 70–99)
Glucose-Capillary: 308 mg/dL — ABNORMAL HIGH (ref 70–99)

## 2022-07-12 MED ORDER — INSULIN GLARGINE-YFGN 100 UNIT/ML ~~LOC~~ SOLN
20.0000 [IU] | Freq: Every day | SUBCUTANEOUS | Status: DC
  Administered 2022-07-12 – 2022-07-15 (×4): 20 [IU] via SUBCUTANEOUS

## 2022-07-12 MED ORDER — INSULIN ASPART 100 UNIT/ML IJ SOLN
5.0000 [IU] | Freq: Once | INTRAMUSCULAR | Status: AC
  Administered 2022-07-12: 5 [IU] via SUBCUTANEOUS

## 2022-07-12 MED ORDER — APIXABAN 5 MG PO TABS
5.0000 mg | ORAL_TABLET | Freq: Two times a day (BID) | ORAL | Status: DC
  Administered 2022-07-12 – 2022-07-20 (×16): 5 mg via ORAL
  Filled 2022-07-12 (×21): qty 1

## 2022-07-12 MED ORDER — INSULIN ASPART 100 UNIT/ML IJ SOLN
0.0000 [IU] | Freq: Three times a day (TID) | INTRAMUSCULAR | Status: DC
  Administered 2022-07-12: 4 [IU] via SUBCUTANEOUS
  Administered 2022-07-13: 3 [IU] via SUBCUTANEOUS
  Administered 2022-07-13: 1 [IU] via SUBCUTANEOUS
  Administered 2022-07-13: 3 [IU] via SUBCUTANEOUS
  Administered 2022-07-14 (×2): 2 [IU] via SUBCUTANEOUS
  Administered 2022-07-14: 4 [IU] via SUBCUTANEOUS
  Administered 2022-07-15: 3 [IU] via SUBCUTANEOUS
  Administered 2022-07-15 – 2022-07-16 (×4): 2 [IU] via SUBCUTANEOUS
  Administered 2022-07-17: 3 [IU] via SUBCUTANEOUS
  Administered 2022-07-17: 2 [IU] via SUBCUTANEOUS
  Administered 2022-07-17: 3 [IU] via SUBCUTANEOUS
  Administered 2022-07-18: 5 [IU] via SUBCUTANEOUS
  Administered 2022-07-18 – 2022-07-19 (×3): 1 [IU] via SUBCUTANEOUS
  Administered 2022-07-19: 4 [IU] via SUBCUTANEOUS

## 2022-07-12 MED ORDER — DIPHENHYDRAMINE HCL 50 MG/ML IJ SOLN: 50.0000 mg | Freq: Three times a day (TID) | INTRAMUSCULAR | Status: DC | PRN

## 2022-07-12 MED ORDER — ACETAMINOPHEN 325 MG PO TABS
650.0000 mg | ORAL_TABLET | Freq: Four times a day (QID) | ORAL | Status: DC | PRN
  Administered 2022-07-17 (×2): 650 mg via ORAL
  Filled 2022-07-12 (×2): qty 2

## 2022-07-12 MED ORDER — HYDROXYZINE HCL 25 MG PO TABS
25.0000 mg | ORAL_TABLET | Freq: Three times a day (TID) | ORAL | Status: DC | PRN
  Administered 2022-07-12 – 2022-07-19 (×4): 25 mg via ORAL
  Filled 2022-07-12 (×5): qty 1

## 2022-07-12 MED ORDER — HALOPERIDOL 5 MG PO TABS: 5.0000 mg | ORAL_TABLET | Freq: Three times a day (TID) | ORAL | Status: DC | PRN

## 2022-07-12 MED ORDER — LORAZEPAM 1 MG PO TABS: 2.0000 mg | ORAL_TABLET | Freq: Three times a day (TID) | ORAL | Status: DC | PRN

## 2022-07-12 MED ORDER — HALOPERIDOL LACTATE 5 MG/ML IJ SOLN: 5.0000 mg | Freq: Three times a day (TID) | INTRAMUSCULAR | Status: DC | PRN

## 2022-07-12 MED ORDER — NICOTINE 14 MG/24HR TD PT24
14.0000 mg | MEDICATED_PATCH | Freq: Every day | TRANSDERMAL | Status: DC
  Administered 2022-07-13 – 2022-07-20 (×7): 14 mg via TRANSDERMAL
  Filled 2022-07-12 (×10): qty 1

## 2022-07-12 MED ORDER — INSULIN ASPART 100 UNIT/ML IJ SOLN
0.0000 [IU] | Freq: Every day | INTRAMUSCULAR | Status: DC
  Administered 2022-07-14: 5 [IU] via SUBCUTANEOUS
  Administered 2022-07-14 – 2022-07-18 (×5): 3 [IU] via SUBCUTANEOUS
  Administered 2022-07-19: 2 [IU] via SUBCUTANEOUS

## 2022-07-12 MED ORDER — APIXABAN 5 MG PO TABS
5.0000 mg | ORAL_TABLET | Freq: Two times a day (BID) | ORAL | Status: DC
  Filled 2022-07-12 (×2): qty 1

## 2022-07-12 MED ORDER — TRAZODONE HCL 50 MG PO TABS
50.0000 mg | ORAL_TABLET | Freq: Every evening | ORAL | Status: DC | PRN
  Administered 2022-07-12 – 2022-07-19 (×8): 50 mg via ORAL
  Filled 2022-07-12 (×8): qty 1

## 2022-07-12 MED ORDER — AMLODIPINE BESYLATE 2.5 MG PO TABS
2.5000 mg | ORAL_TABLET | Freq: Every day | ORAL | Status: DC
  Administered 2022-07-12 – 2022-07-18 (×7): 2.5 mg via ORAL
  Filled 2022-07-12 (×9): qty 1

## 2022-07-12 MED ORDER — ALUM & MAG HYDROXIDE-SIMETH 200-200-20 MG/5ML PO SUSP: 30.0000 mL | ORAL | Status: DC | PRN

## 2022-07-12 MED ORDER — MAGNESIUM HYDROXIDE 400 MG/5ML PO SUSP: 30.0000 mL | Freq: Every day | ORAL | Status: DC | PRN

## 2022-07-12 MED ORDER — INSULIN ASPART 100 UNIT/ML IJ SOLN
4.0000 [IU] | Freq: Three times a day (TID) | INTRAMUSCULAR | Status: DC
  Administered 2022-07-12 – 2022-07-15 (×8): 4 [IU] via SUBCUTANEOUS

## 2022-07-12 MED ORDER — DIPHENHYDRAMINE HCL 25 MG PO CAPS: 50.0000 mg | ORAL_CAPSULE | Freq: Three times a day (TID) | ORAL | Status: DC | PRN

## 2022-07-12 MED ORDER — ATORVASTATIN CALCIUM 40 MG PO TABS
40.0000 mg | ORAL_TABLET | Freq: Every day | ORAL | Status: DC
  Administered 2022-07-12 – 2022-07-20 (×9): 40 mg via ORAL
  Filled 2022-07-12 (×11): qty 1

## 2022-07-12 MED ORDER — LORAZEPAM 2 MG/ML IJ SOLN: 2.0000 mg | Freq: Three times a day (TID) | INTRAMUSCULAR | Status: DC | PRN

## 2022-07-12 NOTE — ED Notes (Addendum)
Report given to Feliberto Harts, RN.

## 2022-07-12 NOTE — Group Note (Deleted)
Recreation Therapy Group Note   Group Topic:Team Building  Group Date: 07/12/2022 Start Time: 0930 End Time: 0957 Facilitators: Myria Steenbergen-McCall, Bird Tailor A, NT Location: [address] Dayroom       Affect/Mood: {RT BHH Affect/Mood:26271}   Participation Level: {RT BHH Participation [employer]   Participation Quality: {RT BHH Participation Quality:26268}   Behavior: {RT BHH Group Behavior:26269}   Speech/Thought Process: {RT BHH Speech/Thought:26276}   Insight: {RT BHH Insight:26272}   Judgement: {RT BHH Judgement:26278}   Modes of Intervention: {RT BHH Modes of Intervention:26277}   Patient Response to Interventions:  {RT BHH Patient Response to Intervention:26274}   Education Outcome:  {RT BHH Education Outcome:26279}   Clinical Observations/Individualized Feedback: *** was *** in their participation of session activities and group discussion. Pt identified ***   Plan: {RT BHH Tx Plan:26280}   Fong Mccarry A Demar Shad-McCall, NT,  07/12/2022 12:42 PM

## 2022-07-12 NOTE — Progress Notes (Signed)
Patient admitted voluntarily to 400-2 from [facility] for [facility]. Patient endorses VH of "dead people and snakes". He reports he "couldn't stand it anymore" so he went for help. Patient denies SI, HI, AH, and contracts for safety. Patient is diabetic and on eliquis. He reports using crack cocaine and smoking cigarettes. He denies alcohol and any other substance use. He would like to try a nicotine patch while here. Patient using cane on arrival, walker given and cane stored until discharge. High fall risk measures implemented as pt is unsteady. Patient denies any history of abuse. He reports he lives with his brother in his mom's house and his mom is in a nursing home. He reports his brother and him have a Zenaida Niece they drive. Reports his brother is his support system. He denies any trouble getting medications or getting to appointments. Patient oriented to unit. Food and drink provided. Safety maintained.

## 2022-07-12 NOTE — ED Notes (Signed)
Attempted to call report, [facility] will call back when out of morning meeting.

## 2022-07-12 NOTE — Progress Notes (Signed)
CBG rechecked at 2325 was 304. Patient alert and oriented, no distress reported, denies any s/s of hyperglycemia at this time. Will continue to monitor.

## 2022-07-12 NOTE — BH Assessment (Signed)
Patient has been accepted to [facility]. Patient assigned to room 400-2 Accepting physician Lerry Liner NP Call report to [phone] Representative was [place], [facility].  ER Staff is aware of it: Bonita Quin, ER Secretary Dr. Sherron Flemings, MD Fleet Contras Patient's Nurse.

## 2022-07-12 NOTE — Progress Notes (Signed)
CBG was 404, provider contacted and updated per protocol, patient denies any s/s of hyperglycemia. New order for  Insulin Novolog 5 units SQ one time dose, same administered without side effects,  no distress reported, plan of care ongoing.

## 2022-07-12 NOTE — Tx Team (Signed)
Initial Treatment Plan 07/12/2022 2:35 PM Henry Munoz [id number]    PATIENT STRESSORS: Health problems   Substance abuse     PATIENT STRENGTHS: Communication skills  General fund of knowledge    PATIENT IDENTIFIED PROBLEMS: Psychosis  Substance abuse                   DISCHARGE CRITERIA:  Improved stabilization in mood, thinking, and/or behavior  PRELIMINARY DISCHARGE PLAN: Return to previous living arrangement  PATIENT/FAMILY INVOLVEMENT: This treatment plan has been presented to and reviewed with the patient, Henry Munoz.  The patient has been given the opportunity to ask questions and make suggestions.  Edwyna Perfect, RN 07/12/2022, 2:35 PM

## 2022-07-12 NOTE — Progress Notes (Signed)
Psychoeducational Group Note  Date:  07/12/2022 Time:  2132  Group Topic/Focus:  Relapse Prevention Planning:   The focus of this group is to define relapse and discuss the need for planning to combat relapse.  Participation Level: Did Not Attend  Participation Quality:  Not Applicable  Affect:  Not Applicable  Cognitive:  Not Applicable  Insight:  Not Applicable  Engagement in Group: Not Applicable  Additional Comments:  The patient did not attend the evening speaker's meeting.   Westly Pam 07/12/2022, 9:33 PM

## 2022-07-12 NOTE — ED Notes (Signed)
Safe  transport  called  for  transport  [facility]

## 2022-07-12 NOTE — ED Notes (Signed)
EMTALA reviewed by this RN, pt ready for safe transport

## 2022-07-13 DIAGNOSIS — F209 Schizophrenia, unspecified: Secondary | ICD-10-CM | POA: Diagnosis not present

## 2022-07-13 LAB — GLUCOSE, CAPILLARY
Glucose-Capillary: 158 mg/dL — ABNORMAL HIGH (ref 70–99)
Glucose-Capillary: 264 mg/dL — ABNORMAL HIGH (ref 70–99)
Glucose-Capillary: 275 mg/dL — ABNORMAL HIGH (ref 70–99)
Glucose-Capillary: 351 mg/dL — ABNORMAL HIGH (ref 70–99)

## 2022-07-13 MED ORDER — SERTRALINE HCL 25 MG PO TABS: 25.0000 mg | ORAL_TABLET | Freq: Every day | ORAL | Status: DC

## 2022-07-13 MED ORDER — ARIPIPRAZOLE 5 MG PO TABS
5.0000 mg | ORAL_TABLET | Freq: Every day | ORAL | Status: DC
  Administered 2022-07-13 – 2022-07-14 (×2): 5 mg via ORAL
  Filled 2022-07-13 (×4): qty 1

## 2022-07-13 MED ORDER — POTASSIUM CHLORIDE CRYS ER 20 MEQ PO TBCR
40.0000 meq | EXTENDED_RELEASE_TABLET | Freq: Once | ORAL | Status: AC
  Administered 2022-07-13: 40 meq via ORAL
  Filled 2022-07-13 (×2): qty 2

## 2022-07-13 NOTE — Progress Notes (Signed)
   07/12/22 2150  Psych Admission Type (Psych Patients Only)  Admission Status Voluntary  Psychosocial Assessment  Patient Complaints Depression  Eye Contact Brief  Facial Expression Flat  Affect Appropriate to circumstance  Speech Logical/coherent  Interaction Assertive  Motor Activity Slow  Appearance/Hygiene Disheveled;In scrubs  Behavior Characteristics Cooperative  Mood Depressed;Anxious  Thought Process  Coherency WDL  Content WDL  Delusions None reported or observed  Perception WDL  Hallucination None reported or observed  Judgment Impaired  Confusion None  Danger to Self  Current suicidal ideation? Denies  Danger to Others  Danger to Others None reported or observed

## 2022-07-13 NOTE — Group Note (Signed)
Date:  07/13/2022 Time:  2:21 PM  Group Topic/Focus:  Emotional Education:   The focus of this group is to discuss what feelings/emotions are, and how they are experienced.    Participation Level:  Active  Participation Quality:  Appropriate  Affect:  Appropriate  Cognitive:  Appropriate  Insight: Appropriate  Engagement in Group:  Engaged  Modes of Intervention:  Discussion and Support  Additional Comments:    Memory Dance Tion Tse 07/13/2022, 2:21 PM

## 2022-07-13 NOTE — Plan of Care (Signed)
  Problem: Health Behavior/Discharge Planning: Goal: Identification of resources available to assist in meeting health care needs will improve Outcome: Progressing   Problem: Safety: Goal: Periods of time without injury will increase Outcome: Progressing   Problem: Education: Goal: Knowledge of the prescribed therapeutic regimen will improve Outcome: Progressing

## 2022-07-13 NOTE — BHH Suicide Risk Assessment (Signed)
Suicide Risk Assessment  Admission Assessment    [facility] Admission Suicide Risk Assessment  Nursing information obtained from:  Patient Demographic factors:  Male Current Mental Status:  Suicidal ideation indicated by others Loss Factors:  Decline in physical health Historical Factors:  NA Risk Reduction Factors:  Living with another person, especially a relative  Total Time spent with patient: 30 minutes Principal Problem: Schizophrenia (HCC) Diagnosis:  Principal Problem:   Schizophrenia (HCC)  Subjective Data: Henry Munoz is a 57 year old African-American male with prior psychiatric history significant for bipolar disorder, schizophrenia, 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  Patient presents voluntarily to [facility] from [facility] after stabilization for suicidal ideation x 2 weeks with plans to harm himself or steal from people in the context of crack cocaine smoking.    Continued Clinical Symptoms:  Alcohol Use Disorder Identification Test Final Score (AUDIT): 0 The "Alcohol Use Disorders Identification Test", Guidelines for Use in Primary Care, Second Edition.  World Science writer [facility]). Score between 0-7:  no or low risk or alcohol related problems. Score between 8-15:  moderate risk of alcohol related problems. Score between 16-19:  high risk of alcohol related problems. Score 20 or above:  warrants further diagnostic evaluation for alcohol dependence and treatment.  CLINICAL FACTORS:   Severe Anxiety and/or Agitation Panic Attacks Depression:   Anhedonia Delusional Hopelessness Impulsivity Severe Alcohol/Substance Abuse/Dependencies Schizophrenia:   Paranoid or undifferentiated type More than one psychiatric diagnosis Currently Psychotic Previous Psychiatric Diagnoses and Treatments Medical Diagnoses and  Treatments/Surgeries  Musculoskeletal: Strength & Muscle Tone: within normal limits Gait & Station: normal Patient leans: N/A  Psychiatric Specialty Exam:  Presentation  General Appearance:  Casual  Eye Contact: Fair  Speech: Clear and Coherent; Normal Rate  Speech Volume: Normal  Handedness: Right  Mood and Affect  Mood: Anxious; Depressed; Hopeless  Affect: Depressed  Thought Process  Thought Processes: Coherent; Goal Directed; Linear  Descriptions of Associations:Intact  Orientation:Full (Time, Place and Person)  Thought Content:WDL; Logical  History of Schizophrenia/Schizoaffective disorder:Yes  Duration of Psychotic Symptoms:Greater than six months  Hallucinations:Hallucinations: Visual Description of Visual Hallucinations: Seeing dead people x 2 weeks  Ideas of Reference:Delusions; Paranoia  Suicidal Thoughts:Suicidal Thoughts: No SI Active Intent and/or Plan: -- (Denies)  Homicidal Thoughts:Homicidal Thoughts: No  Sensorium  Memory: Immediate Fair; Recent Fair  Judgment: Impaired  Insight: Shallow  Executive Functions  Concentration: Fair  Attention Span: Fair  Recall: [employer] of Knowledge: Fair  Language: Fair  Psychomotor Activity  Psychomotor Activity: Psychomotor Activity: Normal  Assets  Assets: Communication Skills; Desire for Improvement; Housing; Physical Health; Resilience  Sleep  Sleep: Sleep: Good Number of Hours of Sleep: 9  Physical Exam: Physical Exam Vitals and nursing note reviewed.  Constitutional:      Appearance: He is obese.  HENT:     Head: Normocephalic.     Nose: Nose normal.     Mouth/Throat:     Mouth: Mucous membranes are moist.     Pharynx: Oropharynx is clear.  Eyes:     Pupils: Pupils are equal, round, and reactive to light.  Cardiovascular:     Rate and Rhythm: Bradycardia present.  Pulmonary:     Effort: Pulmonary effort is normal.  Abdominal:     Comments:  deferred  Genitourinary:    Comments: deferred Musculoskeletal:        General: Normal range of motion.  Cervical back: Normal range of motion.  Skin:    General: Skin is warm.  Neurological:     General: No focal deficit present.     Mental Status: He is alert and oriented to person, place, and time.  Psychiatric:        Behavior: Behavior normal.    Review of Systems  Constitutional:  Negative for chills and fever.  HENT:  Negative for sore throat.   Eyes:  Negative for blurred vision.  Respiratory:  Negative for shortness of breath.   Cardiovascular:        Hx of diabetes  Gastrointestinal:  Negative for nausea and vomiting.  Genitourinary: Negative.   Musculoskeletal: Negative.   Neurological: Negative.   Endo/Heme/Allergies:        No known drug allergies  Psychiatric/Behavioral:  Positive for depression, hallucinations and substance abuse. The patient is nervous/anxious.    Blood pressure (!) 114/93, pulse (!) 57, temperature 98.7 F (37.1 C), temperature source Oral, resp. rate 16, height 5\' 7"  (1.702 m), weight 106.1 kg, SpO2 100 %. Body mass index is 36.65 kg/m.   COGNITIVE FEATURES THAT CONTRIBUTE TO RISK:  Polarized thinking    SUICIDE RISK:   Severe:  Frequent, intense, and enduring suicidal ideation, specific plan, no subjective intent, but some objective markers of intent (i.e., choice of lethal method), the method is accessible, some limited preparatory behavior, evidence of impaired self-control, severe dysphoria/symptomatology, multiple risk factors present, and few if any protective factors, particularly a lack of social support.  PLAN OF CARE: Treatment Plan Summary: Daily contact with patient to assess and evaluate symptoms and progress in treatment and Medication management   Observation Level/Precautions:  15 minute checks  Laboratory:  CBC Chemistry Profile HbAIC UDS UA  Psychotherapy: Therapeutic milieu  Medications: See MAR   Consultations: Social work  Discharge Concerns: 5 to 7 days  Estimated LOS:  Other:  n/a    Labs: BMP in a.m. 07/14/2022   Physician Treatment Plan for Primary Diagnosis:  Assessment: Schizophrenia (HCC) Polysubstance use   Plan: Medications: Initiate Abilify 5 mg p.o. daily for mood stabilization  Continue trazodone 50 mg p.o. q. nightly as needed for insomnia. Continue hydroxyzine 25 mg p.o. 3 times daily as needed for anxiety   Medications for medical conditions: Amlodipine Norvasc tablet 2.5 mg p.o. daily for high blood pressure Apixaban Eliquis tablet 5 mg p.o. every 12 hours as needed for PE Atorvastatin Lipitor tablet 40 mg p.o. daily for hyperlipidemia Nicotine NicoDerm CQ dose in milligrams per 24 hours 14 mg patch transdermal for smoking cessation Insulin glargine Semglee injection 20 units subcu daily for diabetes Initiate potassium chloride 40 mill equivalent x 1 dose today 07/13/2022   Agitation protocol: Benadryl capsule 50 mg p.o. 3 times daily as needed agitation or Benadryl injection 50 mg IM 3 times daily as needed agitation   Haldol tablets 5 mg po 3 times daily as needed agitation or Haldol lactate injection 5 mg IM 3 times daily as needed agitation   Lorazepam tablet 2 mg p.o. 3 times daily as needed agitation or Lorazepam injection 2 mg IM 3 times daily as needed agitation   Other PRN Medications -Acetaminophen 650 mg every 6 as needed/mild pain -Maalox 30 mL oral every 4 as needed/digestion -Magnesium hydroxide 30 mL daily as needed/mild constipation   -- The risks/benefits/side-effects/alternatives to this medication were discussed in detail with the patient and time was given for questions. The patient consents to medication trial.              --  Encouraged patient to participate  in unit milieu and in scheduled group therapies    Safety and Monitoring: Voluntary admission to inpatient psychiatric unit for safety, stabilization and  treatment Daily contact with patient to assess and evaluate symptoms and progress in treatment Patient's case to be discussed in multi-disciplinary team meeting Observation Level : q15 minute checks Vital signs: q12 hours Precautions: suicide, but pt currently verbally contracts for safety on unit    Discharge Planning: Social work and case management to assist with discharge planning and identification of hospital follow-up needs prior to discharge Estimated LOS: 5-7 days Discharge Concerns: Need to establish a safety plan; Medication compliance and effectiveness Discharge Goals: Return home with outpatient referrals for mental health follow-up including medication management/psychotherapy.   Long Term Goal(s): Improvement in symptoms so as ready for discharge   Short Term Goals: Ability to identify changes in lifestyle to reduce recurrence of condition will improve, Ability to verbalize feelings will improve, Ability to disclose and discuss suicidal ideas, Ability to demonstrate self-control will improve, Ability to identify and develop effective coping behaviors will improve, Ability to maintain clinical measurements within normal limits will improve, Compliance with prescribed medications will improve, and Ability to identify triggers associated with substance abuse/mental health issues will improve   Physician Treatment Plan for Secondary Diagnosis: Principal Problem:   Schizophrenia (HCC)   I certify that inpatient services furnished can reasonably be expected to improve the patient's condition.   Cecilie Lowers, FNP 07/13/2022, 9:42 AM

## 2022-07-13 NOTE — Group Note (Signed)
Recreation Therapy Group Note   Group Topic:Animal Assisted Therapy   Group Date: 07/13/2022 Start Time: 0945 End Time: 1030 Facilitators: Doretta Remmert-McCall, LRT,CTRS Location: [address] Dayroom   Animal-Assisted Activity (AAA) Program Checklist/Progress Notes Patient Eligibility Criteria Checklist & Daily Group note for Rec Tx Intervention  AAA/T Program Assumption of Risk Form signed by Patient/ or Parent Legal Guardian Yes  Patient understands his/her participation is voluntary Yes   Affect/Mood: N/A   Participation Level: Did not attend    Clinical Observations/Individualized Feedback:     Plan: Continue to engage patient in RT group sessions 2-3x/week.   Venice Liz-McCall, LRT,CTRS 07/13/2022 12:58 PM

## 2022-07-13 NOTE — H&P (Signed)
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  Patient presents voluntarily to [facility] from [facility] after stabilization for suicidal ideation x 2 weeks with plans to harm himself or steal from people in the context of crack cocaine smoking.    Demetri reports he has been seeing dead people x 2 weeks and also hearing voices telling him he is worthless and to kill himself.  He reported history of schizophrenia and being on Seroquel while in prison.  He reports medication noncompliance since he was released from prison 2 years ago, however admits to smoking crack cocaine with last use on 07/10/2022.  During examination, patient indicates that he wants some help with drug addiction and to get back to his medication.  He further added that the voices he is so strong and loud he has to seek some help at the hospital for evaluation.  Per chart review and TTS notes: Henry Munoz is a 57 year old male who presents voluntarily to [facility] via [facility] and unaccompanied.  Pt reports SI with no plan, "I have thought about hurting myself".  Pt reports hearing two voices, "the voices tell me to kill myself, you are not worth sh_t".  Pt reports seeing snakes and dead people, "  can't do this no more".  Pt denies HI.  Pt reports prior self harm by cutting his wrist, when he was a teenager. Pt reports he feels paranoia all the time.  Pt acknowledged the following symptoms: irritable, anxious, worthlessness, hopelessness, suspiciousness, poor judgement and fatigue. Pt reports that he is sleeping two or three hours during the night.  Pt reports eating three meals.  Pt reports smoking crack cocaine daily, "I use $200.00 dollars a day".  Pt reports he smoke a pack of cigarettes every three days.  Pt denies using any other substance use.   Pt identifies his primary stressors as with "I can't do anything for myself, I can't pay a bill, I don't work, I had two strokes, which cause me to feel bad about myself and I smoke cocaine".  Pt reports he live with his brother in his mother's house.  Pt reports no family history of mental illness; also, reports no family history of substance used.  Pt denies any current legal problems.  Pt denies any guns or weapons in the home.   Pt says he is not currently receiving weekly outpatient therapy; also, reports he is not currently taking prescribed medication management, "I have not taking any medication in two years".   Assessment: Patient is seen and examined in the office sitting up in a chair in [address].  He appears alert and oriented to person, place,& situation.  Able to participate in answering questions during the examination.  Attention to hygiene needs improvement, and patient is malodorous.  Encourage staff to provide ADLs supplies for patient to take a shower /  brush teeth.  Mood appears anxious, depressed, and hopeless.  Affect is congruent.  He reports anxiety as #8/10, and depression as #6/10 on a scale of 0-10, with 10 being highest severity.  Reports sleeping over 9 hours last night and feeling restful.  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   Reported good appetite and ate 2 packs of potato chips while in the office.  Made patient aware of his diabetic disorder and reeducate on eating increase carbohydrate meals.  Blood sugar ranging from 158 to 275 today.  Labs reviewed, CMP: With potassium 3.2, replaced with 40 mEq potassium x 1 dose, BMP in a.m.. Lipid profile: Cholesterol 251 high, HDL 26 low, LDL 206 high.  Hemoglobin A1c 14.0 on 06/29/2022.  Diabetic consult ordered.  Thyroid profile: Normal.  UDS positive for cocaine.  EKG: NSR, ventricular rate 87, QT/QTc 400/481.  Patient is admitted for mood stabilization, medication management, and safety.  Mode of transport to Hospital: Safe transport Current Outpatient (Home) Medication List: See home medication list PRN medication prior to evaluation: See home medication list  ED course: Patient was evaluated and stabilized and dispositioned to [facility] for mental health evaluation and treatment Collateral Information: Obtained at this time POA/Legal Guardian: Patient is his own legal guardian  Past Psychiatric Hx: Previous Psych Diagnoses: Schizophrenia, alcohol use dependence, auditory hallucination, cocaine abuse, MDD, suicide ideation. Prior inpatient treatment: Yes, seen at [facility] Current/prior outpatient treatment: Denies Prior rehab hx: Denies Psychotherapy hx: Yes History of suicide: Yes x 2 History of homicide or aggression: Denies Psychiatric medication history: Yes, being treated with Seroquel while in prison. Psychiatric medication compliance history: Noncompliance Neuromodulation history: Denies Current Psychiatrist: Denies Current therapist: Denies  Substance Abuse Hx: Alcohol: Denies alcohol use Tobacco: 1 pack every 3 days Illicit drugs: 1 g of cocaine daily for $200 Rx drug abuse: Denies Rehab hx: Denies  Past Medical History: Medical Diagnoses: Diabetes type 2, receiving insulin, CVA, pulmonary embolism Home Rx: Yes, receiving insulin, and  Eliquis Prior Hosp: Yes, May 2024 Prior Surgeries/Trauma: Yes May 2024 to remove blood clot in lungs Head trauma, LOC, concussions, seizures: Denies Allergies: Haldol [haloperidol Lactate] Not Specified      LMP: Not applicable Contraception: Not applicable PCP: Dr. [facility]reld [facility] at [facility]  Family History: Medical: Denies Psych: Denies Psych Rx: Denies SA/HA: Denies  Substance use family hx: Denies  Social History: Childhood (bring, raised, lives now, parents, siblings, schooling, education): Dropped out of 11th grade Abuse: Denies Marital Status: Single Sexual orientation: Male from birth Children: 4 children, ages 44, 75, 73, and 23 Employment: Unemployed, awaiting on his disability Peer Group: Denies Housing: Has his housing Finances: Has some financial difficulty Legal: Currently no legal issues, however was incarcerated for 26 years for attempting to rob convenience store in Wellsite geologist: Denies serving in the [employer]  Associated Signs/Symptoms:  Depression Symptoms:  depressed mood, anhedonia, fatigue, difficulty concentrating, hopelessness, impaired memory, anxiety, panic attacks, loss of energy/fatigue, weight loss, increased appetite,  (Hypo) Manic Symptoms:  Delusions, Distractibility, Impulsivity, Irritable Mood,  Anxiety Symptoms:  Excessive Worry, Panic Symptoms, Social Anxiety,  Psychotic Symptoms:  Delusions, Hallucinations: Auditory Command:  To kill himself and still from other people Visual Paranoia,  PTSD Symptoms: Had a traumatic exposure:  Patient was a boxer by profession, 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 No.  Has the patient been a risk to self in the past 6 months? No.  Has the patient  been a risk to self within the distant past? Yes.    Is the patient a risk to others? No.  Has the patient been a risk to others in the past 6 months? No.  Has the patient been a risk to others within the distant past? No.   [place] Scale:  Flowsheet Row [facility]) [facility] 07[facility]11/2022 in [facility] Department [facility] ED to Hosp-Admission (Discharged) from 07/02/2022 in [facility]  C-SSRS RISK CATEGORY High Risk High Risk No Risk        Prior Inpatient Therapy: Yes.   If yes, describe: About 2017, patient is not sure Prior Outpatient Therapy: No. If yes, describe: No   Alcohol Screening: 1. How often do you have a drink containing alcohol?: Never 2. How many drinks containing alcohol do you have on a typical day when you are drinking?: 1 or 2 3. How often do you have six or more drinks on one occasion?: Never AUDIT-C Score: 0 4. How often during the last year have you found that you were not able to stop drinking once you had started?: Never 5. How often during the last year have you failed to do what was normally expected from you because of drinking?: Never 6. How often during the last year have you needed a first drink in the morning to get yourself going after a heavy drinking session?: Never 7. How often during the last year have you had a feeling of guilt of remorse after drinking?: Never 8. How often during the last year have you been unable to remember what happened the night before because you had been drinking?: Never 9. Have you or someone else been injured as a result of your drinking?: No 10. Has a relative or friend or a doctor or another health worker been concerned about your drinking or suggested you cut down?: No Alcohol Use Disorder Identification Test Final Score (AUDIT): 0  Substance Abuse History in the last 12 months:  Yes.   Consequences of Substance  Abuse: Medical Consequences:  Auditory and visual hallucinations and shakes Family Consequences:  Altercation with brother DT's: Yes Withdrawal Symptoms:   Nausea Tremors  Previous Psychotropic Medications: Yes   Psychological Evaluations: Yes   Past Medical History:  Past Medical History:  Diagnosis Date   Alcohol abuse    Cocaine abuse (HCC)    Diabetes mellitus without complication (HCC)    Diastolic dysfunction    a. 06/2022 Echo (in setting of acute PE):  EF 50-55%, no rwma, mod-sev LVH, grI DD, mod reduced RV fxn, triv MR w/ MVP of post leaflet, ? bicuspid AoV w/ triv AI and Ao sclerosis.   Hypertension    Pulmonary embolism (HCC)    a. 06/2022 CTA: Large volume PE affecting all lobar braches w/ R heart strain-->s/p mech thrombectomy.   Schizo-affective schizophrenia (HCC)    Stroke [facility])    TIA (transient ischemic attack)    Tobacco abuse     Past Surgical History:  Procedure Laterality Date   PULMONARY THROMBECTOMY Bilateral 06/30/2022   Procedure: PULMONARY THROMBECTOMY;  Surgeon: Renford Dills, MD;  Location: [facility] INVASIVE CV  LAB;  Service: Cardiovascular;  Laterality: Bilateral;   Family History:  Family History  Problem Relation Age of Onset   High blood pressure Father    Diabetes Father    Renal Disease Father    Family Psychiatric  History: No pertinent history per patient  Tobacco Screening:  Social History   Tobacco Use  Smoking Status Every Day   Packs/day: 0.50   Years: 15.00   Additional pack years: 0.00   Total pack years: 7.50   Types: Cigarettes  Smokeless Tobacco Never    BH Tobacco Counseling     Are you interested in Tobacco Cessation Medications?  No value filed. Counseled patient on smoking cessation:  No value filed. Reason Tobacco Screening Not Completed: No value filed.       Social History:  Social History   Substance and Sexual Activity  Alcohol Use Yes   Alcohol/week: 2.0 standard drinks of alcohol   Types: 2 Cans  of beer per week   Comment: per week. 07/11/22 states does not drink     Social History   Substance and Sexual Activity  Drug Use Yes   Types: Cocaine   Comment: Uses weekly. also crack    Additional Social History:   Allergies:   Allergies  Allergen Reactions   Haldol [Haloperidol Lactate]    Lab Results:  Results for orders placed or performed during the hospital encounter of 07/12/22 (from the past 48 hour(s))  Glucose, capillary     Status: Abnormal   Collection Time: 07/12/22  5:26 PM  Result Value Ref Range   Glucose-Capillary 302 (H) 70 - 99 mg/dL    Comment: Glucose reference range applies only to samples taken after fasting for at least 8 hours.  Glucose, capillary     Status: Abnormal   Collection Time: 07/12/22  8:08 PM  Result Value Ref Range   Glucose-Capillary 404 (H) 70 - 99 mg/dL    Comment: Glucose reference range applies only to samples taken after fasting for at least 8 hours.  Glucose, capillary     Status: Abnormal   Collection Time: 07/12/22 11:24 PM  Result Value Ref Range   Glucose-Capillary 308 (H) 70 - 99 mg/dL    Comment: Glucose reference range applies only to samples taken after fasting for at least 8 hours.  Glucose, capillary     Status: Abnormal   Collection Time: 07/13/22  6:08 AM  Result Value Ref Range   Glucose-Capillary 158 (H) 70 - 99 mg/dL    Comment: Glucose reference range applies only to samples taken after fasting for at least 8 hours.   Comment 1 Notify RN     Blood Alcohol level:  Lab Results  Component Value Date   ETH <10 07/11/2022   ETH <10 06/29/2022   Metabolic Disorder Labs:  Lab Results  Component Value Date   HGBA1C 14.0 (H) 06/29/2022   MPG 355.1 06/29/2022   MPG 315 08/10/2016   No results found for: "PROLACTIN" Lab Results  Component Value Date   CHOL 251 (H) 06/29/2022   TRIG 96 06/29/2022   HDL 26 (L) 06/29/2022   CHOLHDL 9.7 06/29/2022   VLDL 19 06/29/2022   LDLCALC 206 (H) 06/29/2022   LDLCALC  152 (H) 08/10/2016   Current Medications: Current Facility-Administered Medications  Medication Dose Route Frequency Provider Last Rate Last Admin   acetaminophen (TYLENOL) tablet 650 mg  650 mg Oral Q6H PRN Jearld Lesch, NP       alum &  mag hydroxide-simeth (MAALOX/MYLANTA) 200-200-20 MG/5ML suspension 30 mL  30 mL Oral Q4H PRN Durwin Nora, Rashaun M, NP       amLODipine (NORVASC) tablet 2.5 mg  2.5 mg Oral Daily Durwin Nora, Rashaun M, NP   2.5 mg at 07/12/22 1421   apixaban (ELIQUIS) tablet 5 mg  5 mg Oral Q12H Otho Bellows, RPH   5 mg at 07/12/22 2215   atorvastatin (LIPITOR) tablet 40 mg  40 mg Oral Daily Jearld Lesch, NP   40 mg at 07/12/22 1421   diphenhydrAMINE (BENADRYL) capsule 50 mg  50 mg Oral TID PRN Jearld Lesch, NP       Or   diphenhydrAMINE (BENADRYL) injection 50 mg  50 mg Intramuscular TID PRN Jearld Lesch, NP       haloperidol (HALDOL) tablet 5 mg  5 mg Oral TID PRN Jearld Lesch, NP       Or   haloperidol lactate (HALDOL) injection 5 mg  5 mg Intramuscular TID PRN Jearld Lesch, NP       hydrOXYzine (ATARAX) tablet 25 mg  25 mg Oral TID PRN Jearld Lesch, NP   25 mg at 07/12/22 2215   insulin aspart (novoLOG) injection 0-5 Units  0-5 Units Subcutaneous QHS Massengill, Harrold Donath, MD       insulin aspart (novoLOG) injection 0-6 Units  0-6 Units Subcutaneous TID WC Massengill, Harrold Donath, MD   1 Units at 07/13/22 0645   insulin aspart (novoLOG) injection 4 Units  4 Units Subcutaneous TID WC Massengill, Harrold Donath, MD   4 Units at 07/13/22 0645   insulin glargine-yfgn (SEMGLEE) injection 20 Units  20 Units Subcutaneous Daily Massengill, Harrold Donath, MD   20 Units at 07/12/22 1421   LORazepam (ATIVAN) tablet 2 mg  2 mg Oral TID PRN Jearld Lesch, NP       Or   LORazepam (ATIVAN) injection 2 mg  2 mg Intramuscular TID PRN Jearld Lesch, NP       magnesium hydroxide (MILK OF MAGNESIA) suspension 30 mL  30 mL Oral Daily PRN Dixon, Rashaun M, NP       nicotine (NICODERM CQ -  dosed in mg/24 hours) patch 14 mg  14 mg Transdermal Daily Massengill, Harrold Donath, MD       traZODone (DESYREL) tablet 50 mg  50 mg Oral QHS PRN Jearld Lesch, NP   50 mg at 07/12/22 2215   PTA Medications: Medications Prior to Admission  Medication Sig Dispense Refill Last Dose   amLODipine (NORVASC) 2.5 MG tablet Take 1 tablet (2.5 mg total) by mouth daily. 30 tablet 3    apixaban (ELIQUIS) 5 MG TABS tablet Take 2 tablets (10 mg total) by mouth 2 (two) times daily. And then from 07/10/22 take 5 mg two times daily 60 tablet 3    atorvastatin (LIPITOR) 40 MG tablet Take 1 tablet (40 mg total) by mouth daily. 30 tablet 3    Blood Glucose Monitoring Suppl DEVI 1 each by Does not apply route 3 (three) times daily. May dispense any manufacturer covered by patient's insurance. (Patient not taking: Reported on 07/11/2022) 1 each 0    clotrimazole (LOTRIMIN) 1 % cream Apply 1 Application topically 2 (two) times daily. (Patient not taking: Reported on 07/11/2022) 30 g 0    Glucose Blood (BLOOD GLUCOSE TEST STRIPS) STRP 1 each by Does not apply route 3 (three) times daily. Use as directed to check blood sugar. May dispense any manufacturer covered by patient's  insurance and fits patient's device. (Patient not taking: Reported on 07/11/2022) 100 strip 0    insulin aspart (NOVOLOG) 100 UNIT/ML FlexPen If eating and Blood Glucose (BG) 80 or higher inject 4 units for meal coverage and add correction dose per scale. If not eating, correction dose only. BG <150= 0 unit; BG 150-200= 1 unit; BG 201-250= 2 unit; BG 251-300= 3 unit; BG 301-350= 4 unit; BG 351-400= 5 unit; BG >400= 6 unit and Call Primary care. 15 mL 0    insulin glargine (LANTUS) 100 UNIT/ML Solostar Pen Inject 20 Units into the skin daily. May substitute as needed per insurance. 15 mL 0    Insulin Pen Needle (PEN NEEDLES) 31G X 5 MM MISC 1 each by Does not apply route 3 (three) times daily. May dispense any manufacturer covered by patient's insurance. (Patient  not taking: Reported on 07/11/2022) 100 each 0    Lancet Device MISC 1 each by Does not apply route 3 (three) times daily. May dispense any manufacturer covered by patient's insurance. (Patient not taking: Reported on 07/11/2022) 1 each 0    Lancets MISC 1 each by Does not apply route 3 (three) times daily. Use as directed to check blood sugar. May dispense any manufacturer covered by patient's insurance and fits patient's device. (Patient not taking: Reported on 07/11/2022) 100 each 0    polyethylene glycol (MIRALAX / GLYCOLAX) 17 g packet Take 17 g by mouth daily as needed for moderate constipation. (Patient not taking: Reported on 07/11/2022) 30 each 0    Musculoskeletal: Strength & Muscle Tone: within normal limits Gait & Station: normal Patient leans: N/A  Psychiatric Specialty Exam:  Presentation  General Appearance:  Casual  Eye Contact: Fair  Speech: Clear and Coherent; Normal Rate  Speech Volume: Normal  Handedness: Right  Mood and Affect  Mood: Anxious; Depressed; Hopeless  Affect: Depressed  Thought Process  Thought Processes: Coherent; Goal Directed; Linear  Duration of Psychotic Symptoms: 36 years  Past Diagnosis of Schizophrenia or Psychoactive disorder: Yes  Descriptions of Associations:Intact  Orientation:Full (Time, Place and Person)  Thought Content:WDL; Logical  Hallucinations:Hallucinations: Visual Description of Visual Hallucinations: Seeing dead people x 2 weeks  Ideas of Reference:Delusions; Paranoia  Suicidal Thoughts:Suicidal Thoughts: No SI Active Intent and/or Plan: -- (Denies)  Homicidal Thoughts:Homicidal Thoughts: No  Sensorium  Memory: Immediate Fair; Recent Fair  Judgment: Impaired  Insight: Shallow  Executive Functions  Concentration: Fair  Attention Span: Fair  Recall: [employer] of Knowledge: Fair  Language: Fair  Psychomotor Activity  Psychomotor Activity: Psychomotor Activity: Normal  Assets   Assets: Communication Skills; Desire for Improvement; Housing; Physical Health; Resilience  Sleep  Sleep: Sleep: Good Number of Hours of Sleep: 9  Physical Exam: Physical Exam Vitals and nursing note reviewed.  Constitutional:      Appearance: He is obese.  HENT:     Head: Normocephalic.     Nose: Nose normal.     Mouth/Throat:     Mouth: Mucous membranes are moist.     Pharynx: Oropharynx is clear.  Eyes:     Pupils: Pupils are equal, round, and reactive to light.  Cardiovascular:     Rate and Rhythm: Bradycardia present.  Pulmonary:     Effort: Pulmonary effort is normal.  Abdominal:     Comments: deferred  Genitourinary:    Comments: deferred Musculoskeletal:        General: Normal range of motion.     Cervical back: Normal range of motion.  Skin:    General: Skin is warm.  Neurological:     General: No focal deficit present.     Mental Status: He is alert and oriented to person, place, and time.  Psychiatric:        Behavior: Behavior normal.    Review of Systems  Constitutional:  Negative for fever.  HENT: Negative.    Eyes:  Negative for blurred vision.  Respiratory:  Positive for shortness of breath.   Cardiovascular:        History of type 2 diabetes, currently on insulin therapy  Gastrointestinal:  Negative for nausea and vomiting.  Genitourinary: Negative.   Musculoskeletal: Negative.   Skin: Negative.   Neurological: Negative.   Endo/Heme/Allergies:        No known drug allergies  Psychiatric/Behavioral:  Positive for depression, hallucinations and substance abuse. The patient is nervous/anxious.    Blood pressure (!) 114/93, pulse (!) 57, temperature 98.7 F (37.1 C), temperature source Oral, resp. rate 16, height 5\' 7"  (1.702 m), weight 106.1 kg, SpO2 100 %. Body mass index is 36.65 kg/m.  Treatment Plan Summary: Daily contact with patient to assess and evaluate symptoms and progress in treatment and Medication management  Observation  Level/Precautions:  15 minute checks  Laboratory:  CBC Chemistry Profile HbAIC UDS UA  Psychotherapy: Therapeutic milieu  Medications: See MAR  Consultations: Social work  Discharge Concerns: 5 to 7 days  Estimated LOS:  Other:  n/a   Labs: BMP in a.m. 07/14/2022  Physician Treatment Plan for Primary Diagnosis:  Assessment: Schizophrenia (HCC) Polysubstance use  Plan: Medications: Abilify 5 mg p.o. daily for mood stabilization  Trazodone 50 mg p.o. q. nightly as needed for insomnia. Hydroxyzine 25 mg p.o. 3 times daily as needed for anxiety  Medications for medical conditions: Amlodipine Norvasc tablet 2.5 mg p.o. daily for high blood pressure Apixaban Eliquis tablet 5 mg p.o. every 12 hours as needed for PE Atorvastatin Lipitor tablet 40 mg p.o. daily for hyperlipidemia Nicotine NicoDerm CQ dose in milligrams per 24 hours 14 mg patch transdermal for smoking cessation Insulin glargine Semglee injection 20 units subcu daily for diabetes Potassium chloride 40 mill equivalent x 1 dose today 07/13/2022  Agitation protocol: Benadryl capsule 50 mg p.o. 3 times daily as needed agitation or Benadryl injection 50 mg IM 3 times daily as needed agitation   Haldol tablets 5 mg po 3 times daily as needed agitation or Haldol lactate injection 5 mg IM 3 times daily as needed agitation   Lorazepam tablet 2 mg p.o. 3 times daily as needed agitation or Lorazepam injection 2 mg IM 3 times daily as needed agitation   Other PRN Medications -Acetaminophen 650 mg every 6 as needed/mild pain -Maalox 30 mL oral every 4 as needed/digestion -Magnesium hydroxide 30 mL daily as needed/mild constipation  -- The risks/benefits/side-effects/alternatives to this medication were discussed in detail with the patient and time was given for questions. The patient consents to medication trial.              -- Encouraged patient to participate  in unit milieu and in scheduled group therapies    Safety and  Monitoring: Voluntary admission to inpatient psychiatric unit for safety, stabilization and treatment Daily contact with patient to assess and evaluate symptoms and progress in treatment Patient's case to be discussed in multi-disciplinary team meeting Observation Level : q15 minute checks Vital signs: q12 hours Precautions: suicide, but pt currently verbally contracts for safety on unit  Discharge Planning: Social work and case management to assist with discharge planning and identification of hospital follow-up needs prior to discharge Estimated LOS: 5-7 days Discharge Concerns: Need to establish a safety plan; Medication compliance and effectiveness Discharge Goals: Return home with outpatient referrals for mental health follow-up including medication management/psychotherapy.   Long Term Goal(s): Improvement in symptoms so as ready for discharge  Short Term Goals: Ability to identify changes in lifestyle to reduce recurrence of condition will improve, Ability to verbalize feelings will improve, Ability to disclose and discuss suicidal ideas, Ability to demonstrate self-control will improve, Ability to identify and develop effective coping behaviors will improve, Ability to maintain clinical measurements within normal limits will improve, Compliance with prescribed medications will improve, and Ability to identify triggers associated with substance abuse/mental health issues will improve  Physician Treatment Plan for Secondary Diagnosis: Principal Problem:   Schizophrenia (HCC)   I certify that inpatient services furnished can reasonably be expected to improve the patient's condition.    Cecilie Lowers, FNP 6/4/20249:50 AM

## 2022-07-13 NOTE — Progress Notes (Signed)
   07/13/22 0610  15 Minute Checks  Location Bedroom  Visual Appearance Calm  Behavior Sleeping  Sleep (Behavioral Health Patients Only)  Calculate sleep? (Click Yes once per 24 hr at 0600 safety check) Yes  Documented sleep last 24 hours 7

## 2022-07-13 NOTE — Progress Notes (Signed)
   07/13/22 0930  Psych Admission Type (Psych Patients Only)  Admission Status Voluntary  Psychosocial Assessment  Patient Complaints Depression  Eye Contact Brief  Facial Expression Flat  Affect Depressed  Speech Logical/coherent  Interaction Assertive  Motor Activity Slow  Appearance/Hygiene Disheveled  Behavior Characteristics Cooperative  Mood Depressed  Thought Process  Coherency WDL  Content WDL  Delusions None reported or observed  Perception WDL  Hallucination None reported or observed  Judgment Impaired  Confusion None  Danger to Self  Current suicidal ideation? Denies  Danger to Others  Danger to Others None reported or observed

## 2022-07-14 ENCOUNTER — Encounter (HOSPITAL_COMMUNITY): Payer: Self-pay

## 2022-07-14 LAB — GLUCOSE, CAPILLARY
Glucose-Capillary: 230 mg/dL — ABNORMAL HIGH (ref 70–99)
Glucose-Capillary: 248 mg/dL — ABNORMAL HIGH (ref 70–99)
Glucose-Capillary: 262 mg/dL — ABNORMAL HIGH (ref 70–99)
Glucose-Capillary: 304 mg/dL — ABNORMAL HIGH (ref 70–99)
Glucose-Capillary: 391 mg/dL — ABNORMAL HIGH (ref 70–99)

## 2022-07-14 LAB — BASIC METABOLIC PANEL
Anion gap: 8 (ref 5–15)
BUN: 14 mg/dL (ref 6–20)
CO2: 27 mmol/L (ref 22–32)
Calcium: 9.3 mg/dL (ref 8.9–10.3)
Chloride: 102 mmol/L (ref 98–111)
Creatinine, Ser: 1.12 mg/dL (ref 0.61–1.24)
GFR, Estimated: >60 mL/min (ref >60)
Glucose, Bld: 291 mg/dL — ABNORMAL HIGH (ref 70–99)
Potassium: 3.7 mmol/L (ref 3.5–5.1)
Sodium: 137 mmol/L (ref 135–145)

## 2022-07-14 MED ORDER — ARIPIPRAZOLE 10 MG PO TABS
10.0000 mg | ORAL_TABLET | Freq: Every day | ORAL | Status: DC
  Administered 2022-07-15 – 2022-07-16 (×2): 10 mg via ORAL
  Filled 2022-07-14 (×3): qty 1

## 2022-07-14 NOTE — BH IP Treatment Plan (Signed)
Interdisciplinary Treatment and Diagnostic Plan Update  07/14/2022 Time of Session: 11:25 AM  Henry Munoz MRN: [id number]  Principal Diagnosis: Schizophrenia [facility])  Secondary Diagnoses: Principal Problem:   Schizophrenia (HCC)   Current Medications:  Current Facility-Administered Medications  Medication Dose Route Frequency Provider Last Rate Last Admin   acetaminophen (TYLENOL) tablet 650 mg  650 mg Oral Q6H PRN Jearld Lesch, NP       alum & mag hydroxide-simeth (MAALOX/MYLANTA) 200-200-20 MG/5ML suspension 30 mL  30 mL Oral Q4H PRN Durwin Nora, Rashaun M, NP       amLODipine (NORVASC) tablet 2.5 mg  2.5 mg Oral Daily Dixon, Rashaun M, NP   2.5 mg at 07/14/22 0829   apixaban (ELIQUIS) tablet 5 mg  5 mg Oral Q12H Green, Terri L, RPH   5 mg at 07/14/22 8295   ARIPiprazole (ABILIFY) tablet 5 mg  5 mg Oral Daily Ntuen, Jesusita Oka, FNP   5 mg at 07/14/22 0829   atorvastatin (LIPITOR) tablet 40 mg  40 mg Oral Daily Jearld Lesch, NP   40 mg at 07/14/22 6213   diphenhydrAMINE (BENADRYL) capsule 50 mg  50 mg Oral TID PRN Jearld Lesch, NP       Or   diphenhydrAMINE (BENADRYL) injection 50 mg  50 mg Intramuscular TID PRN Jearld Lesch, NP       haloperidol (HALDOL) tablet 5 mg  5 mg Oral TID PRN Jearld Lesch, NP       Or   haloperidol lactate (HALDOL) injection 5 mg  5 mg Intramuscular TID PRN Jearld Lesch, NP       hydrOXYzine (ATARAX) tablet 25 mg  25 mg Oral TID PRN Jearld Lesch, NP   25 mg at 07/13/22 2105   insulin aspart (novoLOG) injection 0-5 Units  0-5 Units Subcutaneous QHS Phineas Inches, MD   3 Units at 07/14/22 0003   insulin aspart (novoLOG) injection 0-6 Units  0-6 Units Subcutaneous TID WC Massengill, Harrold Donath, MD   2 Units at 07/14/22 1219   insulin aspart (novoLOG) injection 4 Units  4 Units Subcutaneous TID WC Massengill, Harrold Donath, MD   4 Units at 07/14/22 1218   insulin glargine-yfgn (SEMGLEE) injection 20 Units  20 Units Subcutaneous Daily Massengill,  Harrold Donath, MD   20 Units at 07/14/22 0831   LORazepam (ATIVAN) tablet 2 mg  2 mg Oral TID PRN Jearld Lesch, NP       Or   LORazepam (ATIVAN) injection 2 mg  2 mg Intramuscular TID PRN Jearld Lesch, NP       magnesium hydroxide (MILK OF MAGNESIA) suspension 30 mL  30 mL Oral Daily PRN Dixon, Rashaun M, NP       nicotine (NICODERM CQ - dosed in mg/24 hours) patch 14 mg  14 mg Transdermal Daily Massengill, Harrold Donath, MD   14 mg at 07/14/22 0865   traZODone (DESYREL) tablet 50 mg  50 mg Oral QHS PRN Jearld Lesch, NP   50 mg at 07/13/22 2105   PTA Medications: Medications Prior to Admission  Medication Sig Dispense Refill Last Dose   amLODipine (NORVASC) 2.5 MG tablet Take 1 tablet (2.5 mg total) by mouth daily. 30 tablet 3    apixaban (ELIQUIS) 5 MG TABS tablet Take 2 tablets (10 mg total) by mouth 2 (two) times daily. And then from 07/10/22 take 5 mg two times daily 60 tablet 3    atorvastatin (LIPITOR) 40 MG tablet Take 1 tablet (  40 mg total) by mouth daily. 30 tablet 3    Blood Glucose Monitoring Suppl DEVI 1 each by Does not apply route 3 (three) times daily. May dispense any manufacturer covered by patient's insurance. (Patient not taking: Reported on 07/11/2022) 1 each 0    clotrimazole (LOTRIMIN) 1 % cream Apply 1 Application topically 2 (two) times daily. (Patient not taking: Reported on 07/11/2022) 30 g 0    Glucose Blood (BLOOD GLUCOSE TEST STRIPS) STRP 1 each by Does not apply route 3 (three) times daily. Use as directed to check blood sugar. May dispense any manufacturer covered by patient's insurance and fits patient's device. (Patient not taking: Reported on 07/11/2022) 100 strip 0    insulin aspart (NOVOLOG) 100 UNIT/ML FlexPen If eating and Blood Glucose (BG) 80 or higher inject 4 units for meal coverage and add correction dose per scale. If not eating, correction dose only. BG <150= 0 unit; BG 150-200= 1 unit; BG 201-250= 2 unit; BG 251-300= 3 unit; BG 301-350= 4 unit; BG 351-400= 5 unit;  BG >400= 6 unit and Call Primary care. 15 mL 0    insulin glargine (LANTUS) 100 UNIT/ML Solostar Pen Inject 20 Units into the skin daily. May substitute as needed per insurance. 15 mL 0    Insulin Pen Needle (PEN NEEDLES) 31G X 5 MM MISC 1 each by Does not apply route 3 (three) times daily. May dispense any manufacturer covered by patient's insurance. (Patient not taking: Reported on 07/11/2022) 100 each 0    Lancet Device MISC 1 each by Does not apply route 3 (three) times daily. May dispense any manufacturer covered by patient's insurance. (Patient not taking: Reported on 07/11/2022) 1 each 0    Lancets MISC 1 each by Does not apply route 3 (three) times daily. Use as directed to check blood sugar. May dispense any manufacturer covered by patient's insurance and fits patient's device. (Patient not taking: Reported on 07/11/2022) 100 each 0    polyethylene glycol (MIRALAX / GLYCOLAX) 17 g packet Take 17 g by mouth daily as needed for moderate constipation. (Patient not taking: Reported on 07/11/2022) 30 each 0     Patient Stressors: Health problems   Substance abuse    Patient Strengths: Forensic psychologist fund of knowledge   Treatment Modalities: Medication Management, Group therapy, Case management,  1 to 1 session with clinician, Psychoeducation, Recreational therapy.   Physician Treatment Plan for Primary Diagnosis: Schizophrenia (HCC) Long Term Goal(s): Improvement in symptoms so as ready for discharge   Short Term Goals: Ability to identify changes in lifestyle to reduce recurrence of condition will improve Ability to verbalize feelings will improve Ability to disclose and discuss suicidal ideas Ability to demonstrate self-control will improve Ability to identify and develop effective coping behaviors will improve Ability to maintain clinical measurements within normal limits will improve Compliance with prescribed medications will improve Ability to identify triggers associated  with substance abuse/mental health issues will improve  Medication Management: Evaluate patient's response, side effects, and tolerance of medication regimen.  Therapeutic Interventions: 1 to 1 sessions, Unit Group sessions and Medication administration.  Evaluation of Outcomes: Not Progressing  Physician Treatment Plan for Secondary Diagnosis: Principal Problem:   Schizophrenia (HCC)  Long Term Goal(s): Improvement in symptoms so as ready for discharge   Short Term Goals: Ability to identify changes in lifestyle to reduce recurrence of condition will improve Ability to verbalize feelings will improve Ability to disclose and discuss suicidal ideas Ability to demonstrate self-control  will improve Ability to identify and develop effective coping behaviors will improve Ability to maintain clinical measurements within normal limits will improve Compliance with prescribed medications will improve Ability to identify triggers associated with substance abuse/mental health issues will improve     Medication Management: Evaluate patient's response, side effects, and tolerance of medication regimen.  Therapeutic Interventions: 1 to 1 sessions, Unit Group sessions and Medication administration.  Evaluation of Outcomes: Not Progressing   RN Treatment Plan for Primary Diagnosis: Schizophrenia (HCC) Long Term Goal(s): Knowledge of disease and therapeutic regimen to maintain health will improve  Short Term Goals: Ability to remain free from injury will improve, Ability to verbalize frustration and anger appropriately will improve, Ability to demonstrate self-control, Ability to participate in decision making will improve, Ability to verbalize feelings will improve, Ability to disclose and discuss suicidal ideas, Ability to identify and develop effective coping behaviors will improve, and Compliance with prescribed medications will improve  Medication Management: RN will administer medications as  ordered by provider, will assess and evaluate patient's response and provide education to patient for prescribed medication. RN will report any adverse and/or side effects to prescribing provider.  Therapeutic Interventions: 1 on 1 counseling sessions, Psychoeducation, Medication administration, Evaluate responses to treatment, Monitor vital signs and CBGs as ordered, Perform/monitor CIWA, COWS, AIMS and Fall Risk screenings as ordered, Perform wound care treatments as ordered.  Evaluation of Outcomes: Not Progressing   LCSW Treatment Plan for Primary Diagnosis: Schizophrenia (HCC) Long Term Goal(s): Safe transition to appropriate next level of care at discharge, Engage patient in therapeutic group addressing interpersonal concerns.  Short Term Goals: Engage patient in aftercare planning with referrals and resources, Increase social support, Increase ability to appropriately verbalize feelings, Increase emotional regulation, Facilitate acceptance of mental health diagnosis and concerns, Facilitate patient progression through stages of change regarding substance use diagnoses and concerns, Identify triggers associated with mental health/substance abuse issues, and Increase skills for wellness and recovery  Therapeutic Interventions: Assess for all discharge needs, 1 to 1 time with Social worker, Explore available resources and support systems, Assess for adequacy in community support network, Educate family and significant other(s) on suicide prevention, Complete Psychosocial Assessment, Interpersonal group therapy.  Evaluation of Outcomes: Not Progressing   Progress in Treatment: Attending groups: Yes. Participating in groups: Yes. Taking medication as prescribed: Yes. Toleration medication: Yes. Family/Significant other contact made: No, will contact:  Brother : Bishop\ Patient understands diagnosis: Yes. Discussing patient identified problems/goals with staff: Yes. Medical problems  stabilized or resolved: Yes. Denies suicidal/homicidal ideation: Yes. Issues/concerns per patient self-inventory: No.   New problem(s) identified: No, Describe:  None reported   New Short Term/Long Term Goal(s):detox, medication management for mood stabilization; elimination of SI thoughts; development of comprehensive mental wellness/sobriety plan   Patient Goals:  " Get better and stop having visual hallucinating "   Discharge Plan or Barriers: Patient recently admitted. CSW will continue to follow and assess for appropriate referrals and possible discharge planning.    Reason for Continuation of Hospitalization: Anxiety Depression Hallucinations Medication stabilization Suicidal ideation Withdrawal symptoms  Estimated Length of Stay: 3-5 Days   Last 3 [place] Suicide Severity Risk Score: Flowsheet Row [facility] (Current[facility]11/2022 in [facility] ED to Hosp-Admission (Discharged) from 07/02/2022 in [facility] PCU  C-SSRS RISK CATEGORY High Risk High Risk No Risk       Last PHQ 2/9 Scores:  No data to display          Scribe for Treatment Team: Beather Arbour 07/14/2022 1:59 PM

## 2022-07-14 NOTE — Progress Notes (Signed)
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  Patient presents voluntarily to [facility] from [facility] after stabilization for suicidal ideation x 2 weeks with plans to harm himself or steal from people in the context of crack cocaine smoking.  24-hour chart Review: Past 24 hours of patient's chart was reviewed.  Patient is compliant with scheduled meds. As needed medications: Patient received hydroxyzine 25 mg p.o. last night for anxiety at 2105; trazodone 50 mg p.o. for insomnia Required Agitation PRNs: none Per RN notes, no documented behavioral issues and is attending group. Patient slept, 9 hours.    Today's assessment notes: Henry Munoz is seen and examined in the office sitting up in a chair.  He is alert and oriented to person, place and situation.  Mood appears less depressed with congruent affect.  Attention to hygiene improved, patient reportedly had a shower last night with change of clothing.  Speech clear with slightly impairment.  Able to maintain good eye contact with this provider.  Reported anxiety as #7/10, and depression as #6/10, with 10 being of highest severity.  Continues Abilify 5 milligram p.o. daily, which would be increased to 10 mg p.o. starting tomorrow 07/15/2022.  Denies complaint of somatic discomfort and drinking adequate fluid for hydration.  Reports appetite is good, however continues to consume high carbohydrate snacks.  Education provided on nutrition for his diabetes.  Patient did not appear to be responding to  internal stimuli.  However he continues to endorse visual hallucination of seeing dead people coming towards him with black eyes.  Denies SI HI or AH.  Will continue current treatment plan as indicated below.  Total Time spent with patient: 30 minutes  Past Psychiatric History: Previous Psych Diagnoses: Schizophrenia, alcohol use dependence, auditory hallucination, cocaine abuse, MDD, suicide ideation. Prior inpatient treatment: Yes, seen at [facility] Current/prior outpatient treatment: Denies Prior rehab hx: Denies Psychotherapy hx: Yes History of suicide: Yes x 2 History of homicide or aggression: Denies Psychiatric medication history: Yes, being treated with Seroquel while in prison. Psychiatric medication compliance history: Noncompliance Neuromodulation history: Denies Current Psychiatrist: Denies Current therapist: Denies   Past Medical History:  Past Medical History:  Diagnosis Date   Alcohol abuse    Cocaine abuse (HCC)    Diabetes mellitus without complication (HCC)    Diastolic dysfunction    a. 06/2022 Echo (in setting of acute PE):  EF 50-55%, no rwma, mod-sev LVH, grI DD, mod reduced RV fxn, triv MR w/ MVP of post leaflet, ? bicuspid AoV w/ triv AI and Ao sclerosis.   Hypertension    Pulmonary embolism (HCC)    a. 06/2022 CTA: Large volume PE affecting all lobar braches w/ R heart strain-->s/p mech thrombectomy.   Schizo-affective schizophrenia (HCC)    Stroke [facility])    TIA (transient ischemic attack)    Tobacco abuse     Past Surgical History:  Procedure Laterality Date   PULMONARY THROMBECTOMY Bilateral 06/30/2022   Procedure: PULMONARY THROMBECTOMY;  Surgeon: Renford Dills, MD;  Location: [facility] INVASIVE CV LAB;  Service: Cardiovascular;  Laterality: Bilateral;   Family History:  Family History  Problem  Relation Age of Onset   High blood pressure Father    Diabetes Father    Renal Disease Father    Family Psychiatric  History: See  H&P Social History:  Social History   Substance and Sexual Activity  Alcohol Use Yes   Alcohol/week: 2.0 standard drinks of alcohol   Types: 2 Cans of beer per week   Comment: per week. 07/11/22 states does not drink     Social History   Substance and Sexual Activity  Drug Use Yes   Types: Cocaine   Comment: Uses weekly. also crack    Social History   Socioeconomic History   Marital status: Single    Spouse name: Not on file   Number of children: Not on file   Years of education: Not on file   Highest education level: Not on file  Occupational History   Not on file  Tobacco Use   Smoking status: Every Day    Packs/day: 0.50    Years: 15.00    Additional pack years: 0.00    Total pack years: 7.50    Types: Cigarettes   Smokeless tobacco: Never  Substance and Sexual Activity   Alcohol use: Yes    Alcohol/week: 2.0 standard drinks of alcohol    Types: 2 Cans of beer per week    Comment: per week. 07/11/22 states does not drink   Drug use: Yes    Types: Cocaine    Comment: Uses weekly. also crack   Sexual activity: Not on file  Other Topics Concern   Not on file  Social History Narrative   Lives locally.  Does not routinely exercise.   Social Determinants of Health   Financial Resource Strain: Low Risk  (10/13/2017)   Overall Financial Resource Strain (CARDIA)    Difficulty of Paying Living Expenses: Not hard at all  Food Insecurity: No Food Insecurity (07/12/2022)   Hunger Vital Sign    Worried About Running Out of Food in the Last Year: Never true    Ran Out of Food in the Last Year: Never true  Transportation Needs: No Transportation Needs (07/12/2022)   PRAPARE - Administrator, Civil Service (Medical): No    Lack of Transportation (Non-Medical): No  Recent Concern: Transportation Needs - Unmet Transportation Needs (07/04/2022)   PRAPARE - Transportation    Lack of Transportation (Medical): Yes    Lack of Transportation (Non-Medical): Yes  Physical  Activity: Inactive (10/13/2017)   Exercise Vital Sign    Days of Exercise per Week: 0 days    Minutes of Exercise per Session: 0 min  Stress: No Stress Concern Present (10/13/2017)   [employer] of Occupational Health - Occupational Stress Questionnaire    Feeling of Stress : Not at all  Social Connections: Somewhat Isolated (10/13/2017)   Social Connection and Isolation Panel [NHANES]    Frequency of Communication with Friends and Family: Three times a week    Frequency of Social Gatherings with Friends and Family: Never    Attends Religious Services: 1 to 4 times per year    Active Member of [employer] or Organizations: No    Attends Banker Meetings: Never    Marital Status: Separated   Additional Social History:   Sleep: Good  Appetite:  Good  Current Medications: Current Facility-Administered Medications  Medication Dose Route Frequency Provider Last Rate Last Admin   acetaminophen (TYLENOL) tablet 650 mg  650 mg Oral Q6H PRN Jearld Lesch, NP  alum & mag hydroxide-simeth (MAALOX/MYLANTA) 200-200-20 MG/5ML suspension 30 mL  30 mL Oral Q4H PRN Durwin Nora, Rashaun M, NP       amLODipine (NORVASC) tablet 2.5 mg  2.5 mg Oral Daily Dixon, Rashaun M, NP   2.5 mg at 07/14/22 0829   apixaban (ELIQUIS) tablet 5 mg  5 mg Oral Q12H Green, Terri L, RPH   5 mg at 07/14/22 1610   ARIPiprazole (ABILIFY) tablet 5 mg  5 mg Oral Daily Michall Noffke, Jesusita Oka, FNP   5 mg at 07/14/22 9604   atorvastatin (LIPITOR) tablet 40 mg  40 mg Oral Daily Jearld Lesch, NP   40 mg at 07/14/22 5409   diphenhydrAMINE (BENADRYL) capsule 50 mg  50 mg Oral TID PRN Jearld Lesch, NP       Or   diphenhydrAMINE (BENADRYL) injection 50 mg  50 mg Intramuscular TID PRN Jearld Lesch, NP       haloperidol (HALDOL) tablet 5 mg  5 mg Oral TID PRN Jearld Lesch, NP       Or   haloperidol lactate (HALDOL) injection 5 mg  5 mg Intramuscular TID PRN Jearld Lesch, NP       hydrOXYzine (ATARAX) tablet 25 mg   25 mg Oral TID PRN Jearld Lesch, NP   25 mg at 07/13/22 2105   insulin aspart (novoLOG) injection 0-5 Units  0-5 Units Subcutaneous QHS Phineas Inches, MD   3 Units at 07/14/22 0003   insulin aspart (novoLOG) injection 0-6 Units  0-6 Units Subcutaneous TID WC Massengill, Harrold Donath, MD   4 Units at 07/14/22 1734   insulin aspart (novoLOG) injection 4 Units  4 Units Subcutaneous TID WC Massengill, Harrold Donath, MD   4 Units at 07/14/22 1734   insulin glargine-yfgn (SEMGLEE) injection 20 Units  20 Units Subcutaneous Daily Massengill, Harrold Donath, MD   20 Units at 07/14/22 0831   LORazepam (ATIVAN) tablet 2 mg  2 mg Oral TID PRN Jearld Lesch, NP       Or   LORazepam (ATIVAN) injection 2 mg  2 mg Intramuscular TID PRN Jearld Lesch, NP       magnesium hydroxide (MILK OF MAGNESIA) suspension 30 mL  30 mL Oral Daily PRN Dixon, Rashaun M, NP       nicotine (NICODERM CQ - dosed in mg/24 hours) patch 14 mg  14 mg Transdermal Daily Massengill, Harrold Donath, MD   14 mg at 07/14/22 8119   traZODone (DESYREL) tablet 50 mg  50 mg Oral QHS PRN Jearld Lesch, NP   50 mg at 07/13/22 2105   Lab Results:  Results for orders placed or performed during the hospital encounter of 07/12/22 (from the past 48 hour(s))  Glucose, capillary     Status: Abnormal   Collection Time: 07/12/22  8:08 PM  Result Value Ref Range   Glucose-Capillary 404 (H) 70 - 99 mg/dL    Comment: Glucose reference range applies only to samples taken after fasting for at least 8 hours.  Glucose, capillary     Status: Abnormal   Collection Time: 07/12/22 11:24 PM  Result Value Ref Range   Glucose-Capillary 308 (H) 70 - 99 mg/dL    Comment: Glucose reference range applies only to samples taken after fasting for at least 8 hours.  Glucose, capillary     Status: Abnormal   Collection Time: 07/13/22  6:08 AM  Result Value Ref Range   Glucose-Capillary 158 (H) 70 - 99 mg/dL  Comment: Glucose reference range applies only to samples taken after  fasting for at least 8 hours.   Comment 1 Notify RN   Glucose, capillary     Status: Abnormal   Collection Time: 07/13/22 12:06 PM  Result Value Ref Range   Glucose-Capillary 264 (H) 70 - 99 mg/dL    Comment: Glucose reference range applies only to samples taken after fasting for at least 8 hours.  Glucose, capillary     Status: Abnormal   Collection Time: 07/13/22  5:07 PM  Result Value Ref Range   Glucose-Capillary 275 (H) 70 - 99 mg/dL    Comment: Glucose reference range applies only to samples taken after fasting for at least 8 hours.  Glucose, capillary     Status: Abnormal   Collection Time: 07/13/22  8:56 PM  Result Value Ref Range   Glucose-Capillary 351 (H) 70 - 99 mg/dL    Comment: Glucose reference range applies only to samples taken after fasting for at least 8 hours.  Glucose, capillary     Status: Abnormal   Collection Time: 07/14/22 12:01 AM  Result Value Ref Range   Glucose-Capillary 262 (H) 70 - 99 mg/dL    Comment: Glucose reference range applies only to samples taken after fasting for at least 8 hours.  Glucose, capillary     Status: Abnormal   Collection Time: 07/14/22  6:23 AM  Result Value Ref Range   Glucose-Capillary 230 (H) 70 - 99 mg/dL    Comment: Glucose reference range applies only to samples taken after fasting for at least 8 hours.  Glucose, capillary     Status: Abnormal   Collection Time: 07/14/22 11:51 AM  Result Value Ref Range   Glucose-Capillary 248 (H) 70 - 99 mg/dL    Comment: Glucose reference range applies only to samples taken after fasting for at least 8 hours.  Glucose, capillary     Status: Abnormal   Collection Time: 07/14/22  5:20 PM  Result Value Ref Range   Glucose-Capillary 304 (H) 70 - 99 mg/dL    Comment: Glucose reference range applies only to samples taken after fasting for at least 8 hours.   Blood Alcohol level:  Lab Results  Component Value Date   ETH <10 07/11/2022   ETH <10 06/29/2022    Metabolic Disorder  Labs: Lab Results  Component Value Date   HGBA1C 14.0 (H) 06/29/2022   MPG 355.1 06/29/2022   MPG 315 08/10/2016   No results found for: "PROLACTIN" Lab Results  Component Value Date   CHOL 251 (H) 06/29/2022   TRIG 96 06/29/2022   HDL 26 (L) 06/29/2022   CHOLHDL 9.7 06/29/2022   VLDL 19 06/29/2022   LDLCALC 206 (H) 06/29/2022   LDLCALC 152 (H) 08/10/2016    Physical Findings: AIMS:  , ,  ,  ,    CIWA:    COWS:     Musculoskeletal: Strength & Muscle Tone: within normal limits Gait & Station: normal Patient leans: N/A  Psychiatric Specialty Exam:  Presentation  General Appearance:  Casual  Eye Contact: Fair  Speech: Clear and Coherent; Normal Rate  Speech Volume: Normal  Handedness: Right  Mood and Affect  Mood: Anxious; Depressed  Affect: Congruent  Thought Process  Thought Processes: Coherent; Linear; Goal Directed  Descriptions of Associations:Intact  Orientation:Full (Time, Place and Person)  Thought Content:WDL  History of Schizophrenia/Schizoaffective disorder:Yes  Duration of Psychotic Symptoms:Greater than six months  Hallucinations:Hallucinations: Visual Description of Visual Hallucinations: Saying that people  with black eyes  Ideas of Reference:Delusions; Paranoia  Suicidal Thoughts:Suicidal Thoughts: No SI Active Intent and/or Plan: -- (Denies)  Homicidal Thoughts:Homicidal Thoughts: No  Sensorium  Memory: Immediate Fair; Recent Fair  Judgment: Impaired  Insight: Shallow  Executive Functions  Concentration: Fair  Attention Span: Fair  Recall: [employer] of Knowledge: Fair  Language: Fair  Psychomotor Activity  Psychomotor Activity: Psychomotor Activity: Normal  Assets  Assets: Communication Skills; Desire for Improvement; Housing; Physical Health; Resilience; Social Support  Sleep  Sleep: Sleep: Good Number of Hours of Sleep: 9  Physical Exam: Physical Exam Vitals and nursing note  reviewed.  HENT:     Head: Normocephalic.     Nose: Nose normal.     Mouth/Throat:     Mouth: Mucous membranes are moist.     Pharynx: Oropharynx is clear.  Eyes:     Extraocular Movements: Extraocular movements intact.     Pupils: Pupils are equal, round, and reactive to light.  Cardiovascular:     Rate and Rhythm: Normal rate.     Pulses: Normal pulses.  Pulmonary:     Effort: Pulmonary effort is normal.  Abdominal:     Comments: deferred  Genitourinary:    Comments: deferred Musculoskeletal:        General: Normal range of motion.     Cervical back: Normal range of motion.  Skin:    General: Skin is warm.  Neurological:     General: No focal deficit present.     Mental Status: He is alert and oriented to person, place, and time.  Psychiatric:        Behavior: Behavior normal.    Review of Systems  Constitutional:  Negative for fever.  HENT:  Negative for sore throat.   Eyes: Negative.   Respiratory:  Negative for shortness of breath.   Cardiovascular:        History of high blood pressure and diabetes  Gastrointestinal: Negative.   Genitourinary: Negative.   Musculoskeletal:  Negative for myalgias and neck pain.  Skin: Negative.   Neurological: Negative.   Endo/Heme/Allergies:        See allergy listing  Psychiatric/Behavioral:  Positive for depression, hallucinations and substance abuse. The patient is nervous/anxious.    Blood pressure (!) 157/94, pulse 80, temperature 99.1 F (37.3 C), temperature source Oral, resp. rate 20, height 5\' 7"  (1.702 m), weight 106.1 kg, SpO2 100 %. Body mass index is 36.65 kg/m.  Treatment Plan Summary: Daily contact with patient to assess and evaluate symptoms and progress in treatment and Medication management Treatment Plan Summary: Daily contact with patient to assess and evaluate symptoms and progress in treatment and Medication management     Labs: BMP in a.m. 07/14/2022; awaiting result of BMP   Physician Treatment  Plan for Primary Diagnosis:  Assessment: Schizophrenia (HCC) Polysubstance use   Plan: Medications: Continue Abilify 5 mg p.o. daily for mood stabilization, titrate to 10 mg p.o. daily starting 07/15/2022 Continue trazodone 50 mg p.o. q. nightly as needed for insomnia. Continue hydroxyzine 25 mg p.o. 3 times daily as needed for anxiety   Medications for medical conditions: Amlodipine Norvasc tablet 2.5 mg p.o. daily for high blood pressure Apixaban Eliquis tablet 5 mg p.o. every 12 hours as needed for PE Atorvastatin Lipitor tablet 40 mg p.o. daily for hyperlipidemia Nicotine NicoDerm CQ dose in milligrams per 24 hours 14 mg patch transdermal for smoking cessation Insulin glargine Semglee injection 20 units subcu daily for diabetes Potassium chloride 40 mill  equivalent x 1 dose today 07/13/2022   Agitation protocol: Benadryl capsule 50 mg p.o. 3 times daily as needed agitation or Benadryl injection 50 mg IM 3 times daily as needed agitation   Haldol tablets 5 mg po 3 times daily as needed agitation or Haldol lactate injection 5 mg IM 3 times daily as needed agitation   Lorazepam tablet 2 mg p.o. 3 times daily as needed agitation or Lorazepam injection 2 mg IM 3 times daily as needed agitation   Other PRN Medications -Acetaminophen 650 mg every 6 as needed/mild pain -Maalox 30 mL oral every 4 as needed/digestion -Magnesium hydroxide 30 mL daily as needed/mild constipation   -- The risks/benefits/side-effects/alternatives to this medication were discussed in detail with the patient and time was given for questions. The patient consents to medication trial.              -- Encouraged patient to participate  in unit milieu and in scheduled group therapies    Safety and Monitoring: Voluntary admission to inpatient psychiatric unit for safety, stabilization and treatment Daily contact with patient to assess and evaluate symptoms and progress in treatment Patient's case to be discussed  in multi-disciplinary team meeting Observation Level : q15 minute checks Vital signs: q12 hours Precautions: suicide, but pt currently verbally contracts for safety on unit    Discharge Planning: Social work and case management to assist with discharge planning and identification of hospital follow-up needs prior to discharge Estimated LOS: 5-7 days Discharge Concerns: Need to establish a safety plan; Medication compliance and effectiveness Discharge Goals: Return home with outpatient referrals for mental health follow-up including medication management/psychotherapy.   Long Term Goal(s): Improvement in symptoms so as ready for discharge   Short Term Goals: Ability to identify changes in lifestyle to reduce recurrence of condition will improve, Ability to verbalize feelings will improve, Ability to disclose and discuss suicidal ideas, Ability to demonstrate self-control will improve, Ability to identify and develop effective coping behaviors will improve, Ability to maintain clinical measurements within normal limits will improve, Compliance with prescribed medications will improve, and Ability to identify triggers associated with substance abuse/mental health issues will improve   Physician Treatment Plan for Secondary Diagnosis: Principal Problem:   Schizophrenia (HCC)     I certify that inpatient services furnished can reasonably be expected to improve the patient's condition.   Cecilie Lowers, FNP 07/14/2022, 6:15 PM  Total Time Spent in Direct Patient Care:  I personally spent 35 minutes on the unit in direct patient care. The direct patient care time included face-to-face time with the patient, reviewing the patient's chart, communicating with other professionals, and coordinating care. Greater than 50% of this time was spent in counseling or coordinating care with the patient regarding goals of hospitalization, psycho-education, and discharge planning needs.  I have independently  evaluated the patient during a face-to-face assessment. I reviewed the patient's chart, and I participated in key portions of the service. I discussed the case with the APP, and I agree with the assessment and plan of care as documented in the APP's note , as addended by me or notated below:  The patient reports that command auditory hallucinations to kill himself, and visual hallucinations continue.  Denies having suicidal thoughts.  I agree with increasing the Abilify for treating the hallucinations.  Mood appears stable.  Phineas Inches, MD Psychiatrist

## 2022-07-14 NOTE — Progress Notes (Signed)
Patient refused HS 4 units Humalog sliding scale coverage for B/G 351. Patient stated he "I am tired of getting my sugar checked you check it too many times here I only check it once or twice at my house. Yu'll need to know that I will not be getting my sugar checked that many times" Patient refused insulin with lots of encouragement. Provide notified. Provider assigned A/C to talk to Patient.  Patient educated on importance of blood glucose check and insulin. Patient later agreed to get his blood glucose checked, after speaking with the [facility], B/G 262 at 0001. 3 units insulin given per sliding scale. Support and encouragement provided.

## 2022-07-14 NOTE — [facility] Group Notes (Signed)
[facility] Group Notes:  (Nursing/MHT/Case Management/Adjunct)  Date:  07/14/2022  Time:  1:52 AM  Type of Therapy:   Wrap-up group  Participation Level:  Active  Participation Quality:  Appropriate  Affect:  Appropriate  Cognitive:  Appropriate  Insight:  Appropriate  Engagement in Group:  Engaged  Modes of Intervention:  Education  Summary of Progress/Problems: Pt goal to take better care of himself. Day was 3/10.  Henry Munoz 07/14/2022, 1:52 AM

## 2022-07-14 NOTE — Progress Notes (Signed)
   07/14/22 1218  Psych Admission Type (Psych Patients Only)  Admission Status Voluntary  Psychosocial Assessment  Patient Complaints Irritability  Eye Contact Brief  Facial Expression Flat  Affect Depressed  Speech Logical/coherent  Interaction Avoidant  Motor Activity Slow  Appearance/Hygiene Disheveled  Behavior Characteristics Agitated  Mood Irritable  Thought Process  Coherency WDL  Content WDL  Delusions None reported or observed  Perception WDL  Hallucination None reported or observed  Judgment Poor  Confusion None  Danger to Self  Current suicidal ideation? Denies  Self-Injurious Behavior No self-injurious ideation or behavior indicators observed or expressed   Agreement Not to Harm Self Yes  Description of Agreement verbal  Danger to Others  Danger to Others None reported or observed

## 2022-07-14 NOTE — Group Note (Signed)
Recreation Therapy Group Note   Group Topic:Problem Solving  Group Date: 07/14/2022 Start Time: 0932 End Time: 1012 Facilitators: Winnie Umali-McCall, LRT,CTRS Location: [address] Dayroom   Goal Area(s) Addresses:  Patient will effectively work with peer towards shared goal.  Patient will identify skills used to make activity successful.  Patient will share challenges and verbalize solution-driven approaches used. Patient will identify how skills used during activity can be used to reach post d/c goals.   Group Description:  [employer]. Patients were provided the following materials: 4 drinking straws, 5 rubber bands, 5 paper clips, 2 index cards and 2 drinking cups. Using the provided materials patients were asked to build a launching mechanism to launch a ping pong ball across the room, approximately 10 feet. Patients were divided into teams of 3-5. Instructions required all materials be incorporated into the device, functionality of items left to the peer group's discretion.   Affect/Mood: N/A   Participation Level: Did not attend    Clinical Observations/Individualized Feedback:     Plan: Continue to engage patient in RT group sessions 2-3x/week.   Mack Alvidrez-McCall, LRT,CTRS 07/14/2022 1:05 PM

## 2022-07-14 NOTE — Inpatient Diabetes Management (Signed)
Inpatient Diabetes Program Recommendations  AACE/ADA: New Consensus Statement on Inpatient Glycemic Control (2015)  Target Ranges:  Prepandial:   less than 140 mg/dL      Peak postprandial:   less than 180 mg/dL (1-2 hours)      Critically ill patients:  140 - 180 mg/dL   Lab Results  Component Value Date   GLUCAP 230 (H) 07/14/2022   HGBA1C 14.0 (H) 06/29/2022    Review of Glycemic Control  Diabetes history: DM2 Outpatient Diabetes medications: Lantus 20 QD, Novolog 4 units TID and 0-6 TID Current orders for Inpatient glycemic control: Semglee 20 QD, Novolog 0-6 TID with meals and 0-5 HS + 4 units TID   HgbA1C - 14%  Inpatient Diabetes Program Recommendations:    Consider increasing meal coverage to 6 units TID with meals  Increase Semglee to 22 units QD  Will continue to follow.  Thank you. Ailene Ards, RD, LDN, CDCES Inpatient Diabetes Coordinator [phone]

## 2022-07-14 NOTE — Group Note (Signed)
Date:  07/14/2022 Time:  11:12 AM  Group Topic/Focus:  Goals Group:   The focus of this group is to help patients establish daily goals to achieve during treatment and discuss how the patient can incorporate goal setting into their daily lives to aide in recovery. Orientation:   The focus of this group is to educate the patient on the purpose and policies of crisis stabilization and provide a format to answer questions about their admission.  The group details unit policies and expectations of patients while admitted.    Participation Level:  Active  Participation Quality:    Affect:  Appropriate  Cognitive:  Appropriate  Insight: Appropriate  Engagement in Group:  Engaged  Modes of Intervention:  Discussion  Additional Comments:    Jaquita Rector 07/14/2022, 11:12 AM

## 2022-07-14 NOTE — [facility] Counselor (Signed)
[facility] LCSW Progress Note   07/14/2022    3:11 PM  Henry Munoz      Type of Note: Residential / Transitional Housing    Patient was given a Insurance underwriter since he stated that he was interested in a residential/transitional housing and accepts his insurance. Patient said that he will look at them once he gets up.    Signed:   Jacob Moores, MSW, [facility] 07/14/2022 3:11 PM

## 2022-07-14 NOTE — Progress Notes (Signed)
Henry Munoz refused to have 8am CBG. RN notify

## 2022-07-14 NOTE — Progress Notes (Signed)
   07/14/22 2200  Psych Admission Type (Psych Patients Only)  Admission Status Voluntary  Psychosocial Assessment  Patient Complaints Depression;Irritability  Eye Contact Brief  Facial Expression Flat;Sad  Affect Depressed;Irritable;Labile  Speech Argumentative  Interaction Avoidant;Forwards little  Motor Activity Slow  Appearance/Hygiene Disheveled  Behavior Characteristics Agitated  Mood Irritable  Thought Process  Coherency WDL  Content Blaming others  Delusions None reported or observed  Perception WDL  Hallucination None reported or observed  Judgment Poor  Confusion None  Danger to Self  Current suicidal ideation? Denies (Denies)  Agreement Not to Harm Self Yes  Description of Agreement verbal  Danger to Others  Danger to Others None reported or observed

## 2022-07-15 LAB — GLUCOSE, CAPILLARY
Glucose-Capillary: 239 mg/dL — ABNORMAL HIGH (ref 70–99)
Glucose-Capillary: 240 mg/dL — ABNORMAL HIGH (ref 70–99)
Glucose-Capillary: 254 mg/dL — ABNORMAL HIGH (ref 70–99)
Glucose-Capillary: 288 mg/dL — ABNORMAL HIGH (ref 70–99)

## 2022-07-15 MED ORDER — INSULIN ASPART 100 UNIT/ML IJ SOLN
6.0000 [IU] | Freq: Three times a day (TID) | INTRAMUSCULAR | Status: DC
  Administered 2022-07-15 – 2022-07-19 (×11): 6 [IU] via SUBCUTANEOUS

## 2022-07-15 MED ORDER — INSULIN GLARGINE-YFGN 100 UNIT/ML ~~LOC~~ SOLN
22.0000 [IU] | Freq: Every day | SUBCUTANEOUS | Status: DC
  Administered 2022-07-16 – 2022-07-20 (×5): 22 [IU] via SUBCUTANEOUS

## 2022-07-15 NOTE — Group Note (Signed)
Date:  07/15/2022 Time:  11:34 AM  Group Topic/Focus:  Goals Group:   The focus of this group is to help patients establish daily goals to achieve during treatment and discuss how the patient can incorporate goal setting into their daily lives to aide in recovery.    Participation Level:  Minimal  Participation Quality:  Inattentive  Affect:  Flat  Cognitive:  Appropriate  Insight: Good  Engagement in Group:  Limited  Modes of Intervention:  Discussion  Additional Comments:     Reymundo Poll 07/15/2022, 11:34 AM

## 2022-07-15 NOTE — [facility] Counselor (Signed)
Adult Comprehensive Assessment  Patient ID: Genero Summersett, male   DOB: April 03, 1965, 57 y.o.   MRN: [id number]  Information Source: Information source: Patient  Current Stressors:  Patient states their primary concerns and needs for treatment are:: " Having visual hallucinations and couldn't stop seeing things" Patient states their goals for this hospitilization and ongoing recovery are:: " stop seeing things " Educational / Learning stressors: None reported Employment / Job issues: None reported Family Relationships: " my brother got mad at me because I was getting high and that is how it startedEngineer, petroleum / Lack of resources (include bankruptcy): " Haven't gotten my disability approved " Housing / Lack of housing: " the state about to take my mom home " Physical health (include injuries & life threatening diseases): None reported Social relationships: None reported Substance abuse: " crack usage " Bereavement / Loss: None reported  Living/Environment/Situation:  Living Arrangements: Alone Living conditions (as described by patient or guardian): House Who else lives in the home?: alone How long has patient lived in current situation?: 2 years What is atmosphere in current home: Temporary, Comfortable  Family History:  Marital status: Single Additional relationship information: N/A Are you sexually active?: Yes What is your sexual orientation?: Heterosexual Has your sexual activity been affected by drugs, alcohol, medication, or emotional stress?: N/A Does patient have children?: Yes How many children?: 5 How is patient's relationship with their children?: close and states that he talks to them daily  Childhood History:  By whom was/is the patient raised?: Both parents Description of patient's relationship with caregiver when they were a child: " good " Patient's description of current relationship with people who raised him/her: Father passed, sees his mom when he can How  were you disciplined when you got in trouble as a child/adolescent?: " switches " Does patient have siblings?: Yes Number of Siblings: 4 Description of patient's current relationship with siblings: Pt states that they all have a relationship Did patient suffer any verbal/emotional/physical/sexual abuse as a child?: No Did patient suffer from severe childhood neglect?: No Has patient ever been sexually abused/assaulted/raped as an adolescent or adult?: No Was the patient ever a victim of a crime or a disaster?: No Witnessed domestic violence?: No Has patient been affected by domestic violence as an adult?: No  Education:  Highest grade of school patient has completed: HS Currently a student?: No Learning disability?: Yes What learning problems does patient have?: Dyslexia  Employment/Work Situation:   Employment Situation: Unemployed Patient's Job has Been Impacted by Current Illness: No What is the Longest Time Patient has Held a Job?: 2 years Where was the Patient Employed at that Time?: Factory Has Patient ever Been in the [employer]?: No  Financial Resources:   Surveyor, quantity resources: Sales executive, Medicaid Does patient have a Lawyer or guardian?: No  Alcohol/Substance Abuse:   What has been your use of drugs/alcohol within the last 12 months?: crack and marijuana daily If attempted suicide, did drugs/alcohol play a role in this?: Yes Alcohol/Substance Abuse Treatment Hx: Denies past history Has alcohol/substance abuse ever caused legal problems?: No  Social Support System:   Conservation officer, nature Support System: Fair Museum/gallery exhibitions officer System: Mom and brothers Type of faith/religion: Ephriam Knuckles How does patient's faith help to cope with current illness?: Church/ read bible/ pray  Leisure/Recreation:   Do You Have Hobbies?: Yes Leisure and Hobbies: Boxing- trainning  Strengths/Needs:   What is the patient's perception of their strengths?: Trainning  people Patient states they can  use these personal strengths during their treatment to contribute to their recovery: Pt did not say Patient states these barriers may affect/interfere with their treatment: Pt did not disclose Patient states these barriers may affect their return to the community: None reported Other important information patient would like considered in planning for their treatment: N/A  Discharge Plan:   Currently receiving community mental health services: No Patient states concerns and preferences for aftercare planning are: Pt is open to therapy and psychiatry services Patient states they will know when they are safe and ready for discharge when: Once visual hallucination stops Does patient have access to transportation?: No Does patient have financial barriers related to discharge medications?: No Plan for no access to transportation at discharge: CSW will need to arrange a Taxi Will patient be returning to same living situation after discharge?: Yes  Summary/Recommendations:   Summary and Recommendations (to be completed by the evaluator): Rhonald Saelens is a 57 y/o [place] American male who was admitted due to having visual hallucination and usage of crack cocaine. Braydon has a psychiatric history of MDD, Cocaine abuse, SI, Auditory Hallucinations etc. Jac states that his current stressors are family, finances , and substance abuse. Patient currently is living in his mom home but is worried because the state is trying to sell it since she is in the nursing home. Patient does not work, denies any abuse as a child/adult and has a history of cocaine use. Patient states that he is not connected to any outside providers. Also will need a ride home since he has no transportation.While here, [place] can benefit from crisis stabilization, medication management, therapeutic milieu, and referrals for services.   Isabella Bowens. 07/15/2022

## 2022-07-15 NOTE — [facility] Group Notes (Signed)
[facility] Group Notes:  (Nursing/MHT/Case Management/Adjunct)  Date:  07/15/2022  Time:  9:23 PM  Type of Therapy:   Wrap-up group  Participation Level:  Did Not Attend  Participation Quality:    Affect:    Cognitive:    Insight:    Engagement in Group:    Modes of Intervention:    Summary of Progress/Problems: Pt refused to attend group.  Henry Munoz 07/15/2022, 9:23 PM

## 2022-07-15 NOTE — Progress Notes (Signed)
Pt requested to speak to his social worker regarding a 28 day program.

## 2022-07-15 NOTE — Progress Notes (Signed)
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  Patient presents voluntarily to [facility] from [facility] after stabilization for suicidal ideation x 2 weeks with plans to harm himself or steal from people in the context of crack cocaine smoking.   24-hour chart Review: Past 24 hours of patient's chart was reviewed.  Patient is compliant with scheduled meds. As needed medications: Patient received trazodone 50 mg p.o. for insomnia Required Agitation PRNs: none Per RN notes, no documented behavioral issues and is attending group. Patient slept, 8 hours.    Today's assessment notes: [name] is seen and examined in his room lying on his bed.  He is alert and oriented to person, place and situation.  Mood appears less depressed with congruent affect.  Attention to hygiene improved and non-odorous.  Speech clear with slight impairment.  Able to maintain good eye contact with this provider.  Reported anxiety as #3/10, and depression as #0/10, with 10 being of highest severity.  This is great mood improvement / stabilization from previous assessment since admission.  Continues Abilify 10 milligram p.o. daily, mood stabilization.  Denies complaint of somatic discomfort and drinking adequate fluid for hydration.  Reports appetite is good.  Encouraged to continue on carbohydrate modified diet for his diabetic condition.  Patient nods in agreement.   Patient did not appear to be responding to internal stimuli.  However, he continues to endorse visual  hallucination of seeing dead people coming towards him with black eyes. This maybe baseline visual hallucination for patient.  Denies SI HI or AH.  He denies auditory hallucination stating that these dead  people do not talk to him.   Will continue current treatment plan as indicated below.  Total Time spent with patient: 30 minutes  Past Psychiatric History: Previous Psych Diagnoses: Schizophrenia, alcohol use dependence, auditory hallucination, cocaine abuse, MDD, suicide ideation. Prior inpatient treatment: Yes, seen at [facility] Current/prior outpatient treatment: Denies Prior rehab hx: Denies Psychotherapy hx: Yes History of suicide: Yes x 2 History of homicide or aggression: Denies Psychiatric medication history: Yes, being treated with Seroquel while in prison. Psychiatric medication compliance history: Noncompliance Neuromodulation history: Denies Current Psychiatrist: Denies Current therapist: Denies   Past Medical History:  Past Medical History:  Diagnosis Date   Alcohol abuse    Cocaine abuse (HCC)    Diabetes mellitus without complication (HCC)    Diastolic dysfunction    a. 06/2022 Echo (in setting of acute PE):  EF 50-55%, no rwma, mod-sev LVH, grI DD, mod reduced RV fxn, triv MR w/ MVP of post leaflet, ? bicuspid AoV w/ triv AI and Ao sclerosis.   Hypertension    Pulmonary embolism (HCC)    a. 06/2022 CTA: Large volume PE affecting all lobar braches w/ R heart strain-->s/p mech thrombectomy.   Schizo-affective schizophrenia (HCC)    Stroke [facility])    TIA (transient ischemic attack)    Tobacco abuse     Past Surgical History:  Procedure Laterality Date   PULMONARY THROMBECTOMY Bilateral 06/30/2022   Procedure: PULMONARY THROMBECTOMY;  Surgeon: Renford Dills, MD;  Location: [facility] INVASIVE CV LAB;  Service: Cardiovascular;  Laterality: Bilateral;   Family History:  Family History  Problem Relation Age of Onset   High blood pressure Father     Diabetes Father    Renal Disease Father    Family Psychiatric  History: See H&P Social History:  Social History   Substance and Sexual Activity  Alcohol Use Yes   Alcohol/week: 2.0 standard drinks of alcohol   Types: 2 Cans of beer per week   Comment: per week. 07/11/22 states does not drink     Social History   Substance and Sexual Activity  Drug Use Yes   Types: Cocaine   Comment: Uses weekly. also crack    Social History   Socioeconomic History   Marital status: Single    Spouse name: Not on file   Number of children: Not on file   Years of education: Not on file   Highest education level: Not on file  Occupational History   Not on file  Tobacco Use   Smoking status: Every Day    Packs/day: 0.50    Years: 15.00    Additional pack years: 0.00    Total pack years: 7.50    Types: Cigarettes   Smokeless tobacco: Never  Substance and Sexual Activity   Alcohol use: Yes    Alcohol/week: 2.0 standard drinks of alcohol    Types: 2 Cans of beer per week    Comment: per week. 07/11/22 states does not drink   Drug use: Yes    Types: Cocaine    Comment: Uses weekly. also crack   Sexual activity: Not on file  Other Topics Concern   Not on file  Social History Narrative   Lives locally.  Does not routinely exercise.   Social Determinants of Health   Financial Resource Strain: Low Risk  (10/13/2017)   Overall Financial Resource Strain (CARDIA)    Difficulty of Paying Living Expenses: Not hard at all  Food Insecurity: No Food Insecurity (07/12/2022)   Hunger Vital Sign    Worried About Running Out of Food in the Last Year: Never true    Ran Out of Food in the Last Year: Never true  Transportation Needs: No Transportation Needs (07/12/2022)   PRAPARE - Administrator, Civil Service (Medical): No    Lack of Transportation (Non-Medical): No  Recent Concern: Transportation Needs - Unmet Transportation Needs (07/04/2022)   PRAPARE - Transportation    Lack of  Transportation (Medical): Yes    Lack of Transportation (Non-Medical): Yes  Physical Activity: Inactive (10/13/2017)   Exercise Vital Sign    Days of Exercise per Week: 0 days    Minutes of Exercise per Session: 0 min  Stress: No Stress Concern Present (10/13/2017)   [employer] of Occupational Health - Occupational Stress Questionnaire    Feeling of Stress : Not at all  Social Connections: Somewhat Isolated (10/13/2017)   Social Connection and Isolation Panel [NHANES]    Frequency of Communication with Friends and Family: Three times a week    Frequency of Social Gatherings with Friends and Family: Never    Attends Religious Services: 1 to 4 times per year    Active Member of [employer] or Organizations: No    Attends Banker Meetings: Never    Marital Status: Separated   Additional Social History:   Sleep: Good  Appetite:  Good  Current Medications: Current Facility-Administered Medications  Medication Dose Route Frequency Provider Last Rate Last Admin   acetaminophen (TYLENOL) tablet 650 mg  650 mg Oral Q6H PRN Jearld Lesch, NP       alum & mag hydroxide-simeth (MAALOX/MYLANTA) 200-200-20 MG/5ML suspension 30 mL  30 mL Oral Q4H PRN Durwin Nora, Rashaun M, NP       amLODipine (NORVASC) tablet 2.5 mg  2.5 mg Oral Daily Durwin Nora, Rashaun M, NP   2.5 mg at 07/15/22 0805   apixaban (ELIQUIS) tablet 5 mg  5 mg Oral Q12H Otho Bellows, RPH   5 mg at 07/15/22 0805   ARIPiprazole (ABILIFY) tablet 10 mg  10 mg Oral Daily Cecilie Lowers, FNP   10 mg at 07/15/22 0805   atorvastatin (LIPITOR) tablet 40 mg  40 mg Oral Daily Jearld Lesch, NP   40 mg at 07/15/22 0805   diphenhydrAMINE (BENADRYL) capsule 50 mg  50 mg Oral TID PRN Jearld Lesch, NP       Or   diphenhydrAMINE (BENADRYL) injection 50 mg  50 mg Intramuscular TID PRN Jearld Lesch, NP       haloperidol (HALDOL) tablet 5 mg  5 mg Oral TID PRN Jearld Lesch, NP       Or   haloperidol lactate (HALDOL) injection 5 mg  5  mg Intramuscular TID PRN Jearld Lesch, NP       hydrOXYzine (ATARAX) tablet 25 mg  25 mg Oral TID PRN Jearld Lesch, NP   25 mg at 07/13/22 2105   insulin aspart (novoLOG) injection 0-5 Units  0-5 Units Subcutaneous QHS Massengill, Harrold Donath, MD   5 Units at 07/14/22 2021   insulin aspart (novoLOG) injection 0-6 Units  0-6 Units Subcutaneous TID WC Phineas Inches, MD   2 Units at 07/15/22 1610   insulin aspart (novoLOG) injection 6 Units  6 Units Subcutaneous TID WC Massengill, Harrold Donath, MD       Melene Muller ON 07/16/2022] insulin glargine-yfgn (SEMGLEE) injection 22 Units  22 Units Subcutaneous Daily Massengill, Harrold Donath, MD       LORazepam (ATIVAN) tablet 2 mg  2 mg Oral TID PRN Jearld Lesch, NP       Or   LORazepam (ATIVAN) injection 2 mg  2 mg Intramuscular TID PRN Jearld Lesch, NP       magnesium hydroxide (MILK OF MAGNESIA) suspension 30 mL  30 mL Oral Daily PRN Dixon, Rashaun M, NP       nicotine (NICODERM CQ - dosed in mg/24 hours) patch 14 mg  14 mg Transdermal Daily Massengill, Harrold Donath, MD   14 mg at 07/15/22 0806   traZODone (DESYREL) tablet 50 mg  50 mg Oral QHS PRN Jearld Lesch, NP   50 mg at 07/14/22 2020   Lab Results:  Results for orders placed or performed during the hospital encounter of 07/12/22 (from the past 48 hour(s))  Glucose, capillary     Status: Abnormal   Collection Time: 07/13/22 12:06 PM  Result Value Ref Range   Glucose-Capillary 264 (H) 70 - 99 mg/dL    Comment: Glucose reference range applies only to samples taken after fasting for at least 8 hours.  Glucose, capillary     Status: Abnormal   Collection Time: 07/13/22  5:07 PM  Result Value Ref Range   Glucose-Capillary 275 (H) 70 - 99 mg/dL    Comment: Glucose reference range applies only to samples taken after fasting for at least 8 hours.  Glucose, capillary     Status: Abnormal   Collection Time: 07/13/22  8:56 PM  Result Value  Ref Range   Glucose-Capillary 351 (H) 70 - 99 mg/dL    Comment:  Glucose reference range applies only to samples taken after fasting for at least 8 hours.  Glucose, capillary     Status: Abnormal   Collection Time: 07/14/22 12:01 AM  Result Value Ref Range   Glucose-Capillary 262 (H) 70 - 99 mg/dL    Comment: Glucose reference range applies only to samples taken after fasting for at least 8 hours.  Glucose, capillary     Status: Abnormal   Collection Time: 07/14/22  6:23 AM  Result Value Ref Range   Glucose-Capillary 230 (H) 70 - 99 mg/dL    Comment: Glucose reference range applies only to samples taken after fasting for at least 8 hours.  Glucose, capillary     Status: Abnormal   Collection Time: 07/14/22 11:51 AM  Result Value Ref Range   Glucose-Capillary 248 (H) 70 - 99 mg/dL    Comment: Glucose reference range applies only to samples taken after fasting for at least 8 hours.  Glucose, capillary     Status: Abnormal   Collection Time: 07/14/22  5:20 PM  Result Value Ref Range   Glucose-Capillary 304 (H) 70 - 99 mg/dL    Comment: Glucose reference range applies only to samples taken after fasting for at least 8 hours.  Basic metabolic panel     Status: Abnormal   Collection Time: 07/14/22  6:14 PM  Result Value Ref Range   Sodium 137 135 - 145 mmol/L   Potassium 3.7 3.5 - 5.1 mmol/L   Chloride 102 98 - 111 mmol/L   CO2 27 22 - 32 mmol/L   Glucose, Bld 291 (H) 70 - 99 mg/dL    Comment: Glucose reference range applies only to samples taken after fasting for at least 8 hours.   BUN 14 6 - 20 mg/dL   Creatinine, Ser 9.81 0.61 - 1.24 mg/dL   Calcium 9.3 8.9 - 19.1 mg/dL   GFR, Estimated >47 >82 mL/min    Comment: (NOTE) Calculated using the CKD-EPI Creatinine Equation (2021)    Anion gap 8 5 - 15    Comment: Performed at [facility], [address] [place], [place] [address]  Glucose, capillary     Status: Abnormal   Collection Time: 07/14/22  8:13 PM  Result Value Ref Range   Glucose-Capillary 391 (H) 70 - 99 mg/dL     Comment: Glucose reference range applies only to samples taken after fasting for at least 8 hours.   Comment 1 Notify RN   Glucose, capillary     Status: Abnormal   Collection Time: 07/15/22  5:56 AM  Result Value Ref Range   Glucose-Capillary 239 (H) 70 - 99 mg/dL    Comment: Glucose reference range applies only to samples taken after fasting for at least 8 hours.   Comment 1 Notify RN    Blood Alcohol level:  Lab Results  Component Value Date   ETH <10 07/11/2022   ETH <10 06/29/2022    Metabolic Disorder Labs: Lab Results  Component Value Date   HGBA1C 14.0 (H) 06/29/2022   MPG 355.1 06/29/2022   MPG 315 08/10/2016   No results found for: "PROLACTIN" Lab Results  Component Value Date   CHOL 251 (H) 06/29/2022   TRIG 96 06/29/2022   HDL 26 (L) 06/29/2022   CHOLHDL 9.7 06/29/2022   VLDL 19 06/29/2022   LDLCALC 206 (H) 06/29/2022   LDLCALC 152 (H) 08/10/2016  Physical Findings: AIMS:  , ,  ,  ,    CIWA:    COWS:     Musculoskeletal: Strength & Muscle Tone: within normal limits Gait & Station: normal Patient leans: N/A  Psychiatric Specialty Exam:  Presentation  General Appearance:  Casual; Fairly Groomed  Eye Contact: Fair  Speech: Clear and Coherent; Normal Rate  Speech Volume: Normal  Handedness: Right  Mood and Affect  Mood: Anxious  Affect: Congruent  Thought Process  Thought Processes: Coherent; Linear  Descriptions of Associations:Intact  Orientation:Full (Time, Place and Person)  Thought Content:WDL  History of Schizophrenia/Schizoaffective disorder:Yes  Duration of Psychotic Symptoms:Greater than six months  Hallucinations:Hallucinations: Visual Description of Visual Hallucinations: Seeing dead people with black eyes  Ideas of Reference:Paranoia; Delusions  Suicidal Thoughts:Suicidal Thoughts: No SI Active Intent and/or Plan: -- (Denies)  Homicidal Thoughts:Homicidal Thoughts: No  Sensorium  Memory: Immediate  Fair; Recent Fair  Judgment: Impaired  Insight: Shallow  Executive Functions  Concentration: Fair  Attention Span: Fair  Recall: [employer] of Knowledge: Fair  Language: Fair  Psychomotor Activity  Psychomotor Activity: Psychomotor Activity: Normal  Assets  Assets: Communication Skills; Desire for Improvement; Housing; Physical Health; Resilience  Sleep  Sleep: Sleep: Good Number of Hours of Sleep: 8  Physical Exam: Physical Exam Vitals and nursing note reviewed.  HENT:     Head: Normocephalic.     Nose: Nose normal.     Mouth/Throat:     Mouth: Mucous membranes are moist.     Pharynx: Oropharynx is clear.  Eyes:     Extraocular Movements: Extraocular movements intact.     Pupils: Pupils are equal, round, and reactive to light.  Cardiovascular:     Rate and Rhythm: Normal rate.     Pulses: Normal pulses.  Pulmonary:     Effort: Pulmonary effort is normal.  Abdominal:     Comments: deferred  Genitourinary:    Comments: deferred Musculoskeletal:        General: Normal range of motion.     Cervical back: Normal range of motion.  Skin:    General: Skin is warm.  Neurological:     General: No focal deficit present.     Mental Status: He is alert and oriented to person, place, and time.  Psychiatric:        Behavior: Behavior normal.    Review of Systems  Constitutional:  Negative for fever.  HENT:  Negative for sore throat.   Eyes: Negative.   Respiratory:  Negative for shortness of breath.   Cardiovascular:        History of high blood pressure and diabetes  Gastrointestinal: Negative.   Genitourinary: Negative.   Musculoskeletal:  Negative for myalgias and neck pain.  Skin: Negative.   Neurological: Negative.   Endo/Heme/Allergies:        See allergy listing  Psychiatric/Behavioral:  Positive for depression, hallucinations and substance abuse. The patient is nervous/anxious.    Blood pressure 136/89, pulse 84, temperature 98.9 F  (37.2 C), temperature source Oral, resp. rate 20, height 5\' 7"  (1.702 m), weight 106.1 kg, SpO2 99 %. Body mass index is 36.65 kg/m.  Treatment Plan Summary: Daily contact with patient to assess and evaluate symptoms and progress in treatment and Medication management    Labs: BMP in a.m. 07/14/2022; potassium level improved to 3.7 from 3.2.  Physician Treatment Plan for Primary Diagnosis:  Assessment: Schizophrenia (HCC) Polysubstance use   Plan: Medications: Continue Abilify 10 mg p.o. daily for mood  stabilization Continue trazodone 50 mg p.o. q. nightly as needed for insomnia. Continue hydroxyzine 25 mg p.o. 3 times daily as needed for anxiety   Medications for medical conditions: Amlodipine Norvasc tablet 2.5 mg p.o. daily for high blood pressure Apixaban Eliquis tablet 5 mg p.o. every 12 hours as needed for PE Atorvastatin Lipitor tablet 40 mg p.o. daily for hyperlipidemia Nicotine NicoDerm CQ dose in milligrams per 24 hours 14 mg patch transdermal for smoking cessation Insulin glargine Semglee injection 20 units subcu daily for diabetes Potassium chloride 40 mill equivalent x 1 dose today 07/13/2022   Agitation protocol: Benadryl capsule 50 mg p.o. 3 times daily as needed agitation or Benadryl injection 50 mg IM 3 times daily as needed agitation   Haldol tablets 5 mg po 3 times daily as needed agitation or Haldol lactate injection 5 mg IM 3 times daily as needed agitation   Lorazepam tablet 2 mg p.o. 3 times daily as needed agitation or Lorazepam injection 2 mg IM 3 times daily as needed agitation   Other PRN Medications -Acetaminophen 650 mg every 6 as needed/mild pain -Maalox 30 mL oral every 4 as needed/digestion -Magnesium hydroxide 30 mL daily as needed/mild constipation   -- The risks/benefits/side-effects/alternatives to this medication were discussed in detail with the patient and time was given for questions. The patient consents to medication trial.               -- Encouraged patient to participate  in unit milieu and in scheduled group therapies    Safety and Monitoring: Voluntary admission to inpatient psychiatric unit for safety, stabilization and treatment Daily contact with patient to assess and evaluate symptoms and progress in treatment Patient's case to be discussed in multi-disciplinary team meeting Observation Level : q15 minute checks Vital signs: q12 hours Precautions: suicide, but pt currently verbally contracts for safety on unit    Discharge Planning: Social work and case management to assist with discharge planning and identification of hospital follow-up needs prior to discharge Estimated LOS: 5-7 days Discharge Concerns: Need to establish a safety plan; Medication compliance and effectiveness Discharge Goals: Return home with outpatient referrals for mental health follow-up including medication management/psychotherapy.   Long Term Goal(s): Improvement in symptoms so as ready for discharge   Short Term Goals: Ability to identify changes in lifestyle to reduce recurrence of condition will improve, Ability to verbalize feelings will improve, Ability to disclose and discuss suicidal ideas, Ability to demonstrate self-control will improve, Ability to identify and develop effective coping behaviors will improve, Ability to maintain clinical measurements within normal limits will improve, Compliance with prescribed medications will improve, and Ability to identify triggers associated with substance abuse/mental health issues will improve   Physician Treatment Plan for Secondary Diagnosis: Principal Problem:   Schizophrenia (HCC)   I certify that inpatient services furnished can reasonably be expected to improve the patient's condition.   Cecilie Lowers, FNP 07/15/2022, 11:07 AM  Psychiatrist  Patient ID: Henry Munoz, male   DOB: 1965-06-13, 57 y.o.   MRN: [id number]

## 2022-07-15 NOTE — Progress Notes (Signed)
     07/15/22 0800  Psych Admission Type (Psych Patients Only)  Admission Status Voluntary  Psychosocial Assessment  Patient Complaints Depression  Eye Contact Brief  Facial Expression Flat  Affect Depressed  Speech Argumentative  Interaction Forwards little  Motor Activity Slow  Appearance/Hygiene Disheveled  Behavior Characteristics Cooperative  Mood Irritable  Thought Process  Coherency WDL  Content WDL  Delusions None reported or observed  Perception WDL  Hallucination None reported or observed  Judgment Poor  Confusion None  Danger to Self  Current suicidal ideation? Denies  Agreement Not to Harm Self Yes  Description of Agreement Verbal  Danger to Others  Danger to Others None reported or observed

## 2022-07-16 LAB — GLUCOSE, CAPILLARY
Glucose-Capillary: 117 mg/dL — ABNORMAL HIGH (ref 70–99)
Glucose-Capillary: 222 mg/dL — ABNORMAL HIGH (ref 70–99)
Glucose-Capillary: 225 mg/dL — ABNORMAL HIGH (ref 70–99)
Glucose-Capillary: 251 mg/dL — ABNORMAL HIGH (ref 70–99)

## 2022-07-16 MED ORDER — ARIPIPRAZOLE 15 MG PO TABS
15.0000 mg | ORAL_TABLET | Freq: Every day | ORAL | Status: DC
  Administered 2022-07-17 – 2022-07-20 (×4): 15 mg via ORAL
  Filled 2022-07-16 (×6): qty 1

## 2022-07-16 MED ORDER — HYDRALAZINE HCL 25 MG PO TABS
25.0000 mg | ORAL_TABLET | Freq: Once | ORAL | Status: AC
  Administered 2022-07-16: 25 mg via ORAL
  Filled 2022-07-16 (×2): qty 1

## 2022-07-16 MED ORDER — ARIPIPRAZOLE 5 MG PO TABS
5.0000 mg | ORAL_TABLET | ORAL | Status: AC
  Administered 2022-07-16: 5 mg via ORAL
  Filled 2022-07-16 (×2): qty 1

## 2022-07-16 NOTE — [facility] Group Notes (Signed)

## 2022-07-16 NOTE — Group Note (Signed)
Date:  07/16/2022 Time:  6:59 PM  Group Topic/Focus:  Support and Check-in-Open Discussion and Journaling    Participation Level:  Did Not Attend  Participation Quality:   n/a  Affect:   n/a  Cognitive:   n/a  Insight: None  Engagement in Group:   n/a  Modes of Intervention:   n/a  Additional Comments:   Pt did not attend.  Edmund Hilda Marico Buckle 07/16/2022, 6:59 PM

## 2022-07-16 NOTE — Group Note (Signed)
Recreation Therapy Group Note   Group Topic:Other  Group Date: 07/16/2022 Start Time: 1300 End Time: 1400 Facilitators: Riah Kehoe-McCall, LRT,CTRS Location: [address] Dayroom   Activity Description/Intervention: Therapeutic Drumming. Patients with peers and staff were given the opportunity to engage in a leader facilitated [facility] Group Empowerment Drumming Circle with staff from the [employer], in partnership with [employer]. Teaching laboratory technician and trained [employer], Theodoro Doing leading with LRT observing and documenting intervention and pt response. This evidenced-based practice targets 7 areas of health and wellbeing in the human experience including: stress-reduction, exercise, self-expression, camaraderie/support, nurturing, spirituality, and music-making (leisure).   Goal Area(s) Addresses:  Patient will engage in pro-social way in music group.  Patient will follow directions of drum leader on the first prompt. Patient will demonstrate no behavioral issues during group.  Patient will identify if a reduction in stress level occurs as a result of participation in therapeutic drum circle.    Affect/Mood: N/A   Participation Level: Did not attend    Clinical Observations/Individualized Feedback:     Plan: Continue to engage patient in RT group sessions 2-3x/week.   Bevin Mayall-McCall, LRT,CTRS 07/16/2022 2:59 PM

## 2022-07-16 NOTE — Progress Notes (Signed)
   07/16/22 0602  15 Minute Checks  Location Bedroom  Visual Appearance Calm  Behavior Composed  Sleep (Behavioral Health Patients Only)  Calculate sleep? (Click Yes once per 24 hr at 0600 safety check) Yes  Documented sleep last 24 hours 7.75

## 2022-07-16 NOTE — Progress Notes (Signed)
   07/15/22 2110  Psych Admission Type (Psych Patients Only)  Admission Status Voluntary  Psychosocial Assessment  Patient Complaints Depression  Eye Contact Brief  Facial Expression Flat  Affect Depressed  Speech Logical/coherent  Interaction Assertive;Minimal  Motor Activity Slow  Appearance/Hygiene Disheveled  Behavior Characteristics Cooperative;Calm  Mood Depressed  Thought Process  Coherency WDL  Content WDL  Delusions None reported or observed  Perception WDL  Hallucination None reported or observed  Judgment Impaired  Confusion None  Danger to Self  Current suicidal ideation? Denies  Agreement Not to Harm Self Yes  Description of Agreement Verbal contract for safety  Danger to Others  Danger to Others None reported or observed   Pt reports 0/10 anxiety and depression. Pt was offered support and encouragement. Pt was given scheduled medications and PRN Trazodone per MAR. Pt was encouraged to attend groups. Q 15 minute checks were done for safety. Pt did not attend group, remained in room asleep. Minimally interactive with peers and staff. Pt has no complaints.Pt receptive to treatment and safety maintained on unit.

## 2022-07-16 NOTE — Progress Notes (Signed)
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 Patient presents voluntarily to [facility] from [facility] after stabilization for suicidal ideation x 2 weeks with plans to harm himself or steal from people in the context of crack cocaine smoking.   On my assessment today, the patient reports that auditory hallucinations are significantly less.  He reports hallucinations have decreased somewhat and are less scary, but he does still experiences regularly, of dead people calling underground black eyes.  He reports that his mood is okay and not depressed.  Reports anxiety is off and on throughout the day.  Reports that paranoia symptoms are less as well.  Reports that sleep is better.  Reports that appetite is at baseline.  Concentration is better.  Denies any SI or HI.  Denies any side effects to current psychiatric medications.  We discussed either continuing the Abilify at current dose and monitoring of his response improves, versus increasing Abilify dose to 50 mg once daily, and the patient like to increase the dose of the Abilify.  We again discussed risk versus benefits of this medication and increasing the dose, in light of his multiple medical comorbidities, the patient is agreeable for the increased dose.  I discussed with him that I ordered a new EKG, and the QTc is 437, which is WNL. We also discussed the patient's elevated blood glucose readings, and I offered to consult the diabetes coordinator again for additional recommendations, and the patient declined this and does not want any changes to his current insulin regimen.   Principal Problem:  Schizophrenia (HCC) Diagnosis: Principal Problem:   Schizophrenia (HCC)  Total Time spent with patient: 20 minutes  Past Psychiatric History:  Previous Psych Diagnoses: Schizophrenia, alcohol use dependence, auditory hallucination, cocaine abuse, MDD, suicide ideation. Prior inpatient treatment: Yes, seen at [facility] Current/prior outpatient treatment: Denies Prior rehab hx: Denies Psychotherapy hx: Yes History of suicide: Yes x 2 History of homicide or aggression: Denies Psychiatric medication history: Yes, being treated with Seroquel while in prison. Psychiatric medication compliance history: Noncompliance Neuromodulation history: Denies Current Psychiatrist: Denies Current therapist: Denies   Past Medical History:  Past Medical History:  Diagnosis Date   Alcohol abuse    Cocaine abuse (HCC)    Diabetes mellitus without complication (HCC)    Diastolic dysfunction    a. 06/2022 Echo (in setting of acute PE):  EF 50-55%, no rwma, mod-sev LVH, grI DD, mod reduced RV fxn, triv MR w/ MVP of post leaflet, ? bicuspid AoV w/ triv AI and Ao sclerosis.   Hypertension    Pulmonary embolism (HCC)    a. 06/2022 CTA: Large volume PE affecting all lobar braches w/ R heart strain-->s/p mech thrombectomy.   Schizo-affective schizophrenia (HCC)    Stroke [facility])    TIA (transient ischemic attack)    Tobacco abuse     Past Surgical History:  Procedure Laterality Date   PULMONARY THROMBECTOMY Bilateral 06/30/2022   Procedure: PULMONARY THROMBECTOMY;  Surgeon: Renford Dills, MD;  Location: [facility] INVASIVE CV LAB;  Service: Cardiovascular;  Laterality: Bilateral;   Family History:  Family History  Problem Relation Age of Onset   High blood pressure Father    Diabetes Father  Renal Disease Father    Family Psychiatric  History: See H&P  Social History:  Social History   Substance and Sexual Activity  Alcohol Use Yes   Alcohol/week: 2.0 standard drinks of alcohol    Types: 2 Cans of beer per week   Comment: per week. 07/11/22 states does not drink     Social History   Substance and Sexual Activity  Drug Use Yes   Types: Cocaine   Comment: Uses weekly. also crack    Social History   Socioeconomic History   Marital status: Single    Spouse name: Not on file   Number of children: Not on file   Years of education: Not on file   Highest education level: Not on file  Occupational History   Not on file  Tobacco Use   Smoking status: Every Day    Packs/day: 0.50    Years: 15.00    Additional pack years: 0.00    Total pack years: 7.50    Types: Cigarettes   Smokeless tobacco: Never  Substance and Sexual Activity   Alcohol use: Yes    Alcohol/week: 2.0 standard drinks of alcohol    Types: 2 Cans of beer per week    Comment: per week. 07/11/22 states does not drink   Drug use: Yes    Types: Cocaine    Comment: Uses weekly. also crack   Sexual activity: Not on file  Other Topics Concern   Not on file  Social History Narrative   Lives locally.  Does not routinely exercise.   Social Determinants of Health   Financial Resource Strain: Low Risk  (10/13/2017)   Overall Financial Resource Strain (CARDIA)    Difficulty of Paying Living Expenses: Not hard at all  Food Insecurity: No Food Insecurity (07/12/2022)   Hunger Vital Sign    Worried About Running Out of Food in the Last Year: Never true    Ran Out of Food in the Last Year: Never true  Transportation Needs: No Transportation Needs (07/12/2022)   PRAPARE - Administrator, Civil Service (Medical): No    Lack of Transportation (Non-Medical): No  Recent Concern: Transportation Needs - Unmet Transportation Needs (07/04/2022)   PRAPARE - Transportation    Lack of Transportation (Medical): Yes    Lack of Transportation (Non-Medical): Yes  Physical Activity: Inactive (10/13/2017)   Exercise Vital Sign    Days of Exercise per Week: 0 days    Minutes of Exercise per Session: 0 min   Stress: No Stress Concern Present (10/13/2017)   [employer] of Occupational Health - Occupational Stress Questionnaire    Feeling of Stress : Not at all  Social Connections: Somewhat Isolated (10/13/2017)   Social Connection and Isolation Panel [NHANES]    Frequency of Communication with Friends and Family: Three times a week    Frequency of Social Gatherings with Friends and Family: Never    Attends Religious Services: 1 to 4 times per year    Active Member of [employer] or Organizations: No    Attends Banker Meetings: Never    Marital Status: Separated   Additional Social History:                           Current Medications: Current Facility-Administered Medications  Medication Dose Route Frequency Provider Last Rate Last Admin   acetaminophen (TYLENOL) tablet 650 mg  650 mg Oral Q6H PRN Jearld Lesch, NP  alum & mag hydroxide-simeth (MAALOX/MYLANTA) 200-200-20 MG/5ML suspension 30 mL  30 mL Oral Q4H PRN Durwin Nora, Rashaun M, NP       amLODipine (NORVASC) tablet 2.5 mg  2.5 mg Oral Daily Dixon, Rashaun M, NP   2.5 mg at 07/16/22 0820   apixaban (ELIQUIS) tablet 5 mg  5 mg Oral Q12H Green, Terri L, RPH   5 mg at 07/16/22 0820   [START ON 07/17/2022] ARIPiprazole (ABILIFY) tablet 15 mg  15 mg Oral Daily River Mckercher, MD       atorvastatin (LIPITOR) tablet 40 mg  40 mg Oral Daily Durwin Nora, Rashaun M, NP   40 mg at 07/16/22 0820   diphenhydrAMINE (BENADRYL) capsule 50 mg  50 mg Oral TID PRN Jearld Lesch, NP       Or   diphenhydrAMINE (BENADRYL) injection 50 mg  50 mg Intramuscular TID PRN Jearld Lesch, NP       haloperidol (HALDOL) tablet 5 mg  5 mg Oral TID PRN Jearld Lesch, NP       Or   haloperidol lactate (HALDOL) injection 5 mg  5 mg Intramuscular TID PRN Jearld Lesch, NP       hydrOXYzine (ATARAX) tablet 25 mg  25 mg Oral TID PRN Jearld Lesch, NP   25 mg at 07/13/22 2105   insulin aspart (novoLOG) injection 0-5 Units  0-5 Units  Subcutaneous QHS Phineas Inches, MD   3 Units at 07/15/22 2112   insulin aspart (novoLOG) injection 0-6 Units  0-6 Units Subcutaneous TID WC Tiffane Sheldon, Harrold Donath, MD   2 Units at 07/16/22 1214   insulin aspart (novoLOG) injection 6 Units  6 Units Subcutaneous TID WC Marisel Tostenson, Harrold Donath, MD   6 Units at 07/16/22 1214   insulin glargine-yfgn (SEMGLEE) injection 22 Units  22 Units Subcutaneous Daily Lizeth Bencosme, Harrold Donath, MD   22 Units at 07/16/22 0825   LORazepam (ATIVAN) tablet 2 mg  2 mg Oral TID PRN Jearld Lesch, NP       Or   LORazepam (ATIVAN) injection 2 mg  2 mg Intramuscular TID PRN Jearld Lesch, NP       magnesium hydroxide (MILK OF MAGNESIA) suspension 30 mL  30 mL Oral Daily PRN Dixon, Rashaun M, NP       nicotine (NICODERM CQ - dosed in mg/24 hours) patch 14 mg  14 mg Transdermal Daily Alston Berrie, Harrold Donath, MD   14 mg at 07/16/22 1610   traZODone (DESYREL) tablet 50 mg  50 mg Oral QHS PRN Jearld Lesch, NP   50 mg at 07/15/22 2111    Lab Results:  Results for orders placed or performed during the hospital encounter of 07/12/22 (from the past 48 hour(s))  Glucose, capillary     Status: Abnormal   Collection Time: 07/14/22  5:20 PM  Result Value Ref Range   Glucose-Capillary 304 (H) 70 - 99 mg/dL    Comment: Glucose reference range applies only to samples taken after fasting for at least 8 hours.  Basic metabolic panel     Status: Abnormal   Collection Time: 07/14/22  6:14 PM  Result Value Ref Range   Sodium 137 135 - 145 mmol/L   Potassium 3.7 3.5 - 5.1 mmol/L   Chloride 102 98 - 111 mmol/L   CO2 27 22 - 32 mmol/L   Glucose, Bld 291 (H) 70 - 99 mg/dL    Comment: Glucose reference range applies only to samples taken after fasting for at  least 8 hours.   BUN 14 6 - 20 mg/dL   Creatinine, Ser 9.52 0.61 - 1.24 mg/dL   Calcium 9.3 8.9 - 84.1 mg/dL   GFR, Estimated >32 >44 mL/min    Comment: (NOTE) Calculated using the CKD-EPI Creatinine Equation (2021)    Anion gap 8 5 -  15    Comment: Performed at [facility], [address], [place], [place] [address]  Glucose, capillary     Status: Abnormal   Collection Time: 07/14/22  8:13 PM  Result Value Ref Range   Glucose-Capillary 391 (H) 70 - 99 mg/dL    Comment: Glucose reference range applies only to samples taken after fasting for at least 8 hours.   Comment 1 Notify RN   Glucose, capillary     Status: Abnormal   Collection Time: 07/15/22  5:56 AM  Result Value Ref Range   Glucose-Capillary 239 (H) 70 - 99 mg/dL    Comment: Glucose reference range applies only to samples taken after fasting for at least 8 hours.   Comment 1 Notify RN   Glucose, capillary     Status: Abnormal   Collection Time: 07/15/22 11:29 AM  Result Value Ref Range   Glucose-Capillary 240 (H) 70 - 99 mg/dL    Comment: Glucose reference range applies only to samples taken after fasting for at least 8 hours.  Glucose, capillary     Status: Abnormal   Collection Time: 07/15/22  5:24 PM  Result Value Ref Range   Glucose-Capillary 254 (H) 70 - 99 mg/dL    Comment: Glucose reference range applies only to samples taken after fasting for at least 8 hours.  Glucose, capillary     Status: Abnormal   Collection Time: 07/15/22  8:58 PM  Result Value Ref Range   Glucose-Capillary 288 (H) 70 - 99 mg/dL    Comment: Glucose reference range applies only to samples taken after fasting for at least 8 hours.   Comment 1 Notify RN    Comment 2 Document in Chart   Glucose, capillary     Status: Abnormal   Collection Time: 07/16/22  5:34 AM  Result Value Ref Range   Glucose-Capillary 117 (H) 70 - 99 mg/dL    Comment: Glucose reference range applies only to samples taken after fasting for at least 8 hours.  Glucose, capillary     Status: Abnormal   Collection Time: 07/16/22 11:51 AM  Result Value Ref Range   Glucose-Capillary 222 (H) 70 - 99 mg/dL    Comment: Glucose reference range applies only to samples taken after fasting for  at least 8 hours.    Blood Alcohol level:  Lab Results  Component Value Date   ETH <10 07/11/2022   ETH <10 06/29/2022    Metabolic Disorder Labs: Lab Results  Component Value Date   HGBA1C 14.0 (H) 06/29/2022   MPG 355.1 06/29/2022   MPG 315 08/10/2016   No results found for: "PROLACTIN" Lab Results  Component Value Date   CHOL 251 (H) 06/29/2022   TRIG 96 06/29/2022   HDL 26 (L) 06/29/2022   CHOLHDL 9.7 06/29/2022   VLDL 19 06/29/2022   LDLCALC 206 (H) 06/29/2022   LDLCALC 152 (H) 08/10/2016    Physical Findings: AIMS:  , ,  ,  ,    CIWA:    COWS:     Aims score zero on my exam. No eps on my exam.  Musculoskeletal: Strength & Muscle Tone: within normal limits  Gait & Station: normal Patient leans: N/A  Psychiatric Specialty Exam:  Presentation  General Appearance:  Casual; Fairly Groomed  Eye Contact: Fair  Speech: Normal Rate  Speech Volume: Normal  Handedness: Right   Mood and Affect  Mood: Anxious  Affect: Congruent   Thought Process  Thought Processes: Linear  Descriptions of Associations:Intact  Orientation:Full (Time, Place and Person)  Thought Content:Logical  History of Schizophrenia/Schizoaffective disorder:Yes  Duration of Psychotic Symptoms:Greater than six months  Hallucinations:Hallucinations: Auditory; Visual Description of Visual Hallucinations: Seeing dead people with black eyes  Ideas of Reference:Paranoia  Suicidal Thoughts:Suicidal Thoughts: No SI Active Intent and/or Plan: -- (Denies)  Homicidal Thoughts:Homicidal Thoughts: No   Sensorium  Memory: Immediate Fair; Recent Fair; Remote Fair  Judgment: Fair  Insight: Fair   Chartered certified accountant: Fair  Attention Span: Fair  Recall: [employer] of Knowledge: Fair  Language: Fair   Psychomotor Activity  Psychomotor Activity: Psychomotor Activity: Normal   Assets  Assets: Communication Skills; Desire for  Improvement; Housing; Physical Health; Resilience   Sleep  Sleep: Sleep: Fair Number of Hours of Sleep: 8    Physical Exam: Physical Exam Vitals reviewed.  Constitutional:      General: He is not in acute distress.    Appearance: He is not toxic-appearing.  Pulmonary:     Effort: Pulmonary effort is normal. No respiratory distress.  Neurological:     Mental Status: He is alert.     Motor: No weakness.     Gait: Gait normal.  Psychiatric:        Behavior: Behavior normal.        Judgment: Judgment normal.    Review of Systems  Constitutional:  Negative for chills and fever.  Cardiovascular:  Negative for chest pain and palpitations.  Neurological:  Negative for dizziness, tingling, tremors and headaches.  Psychiatric/Behavioral:  Positive for hallucinations. Negative for depression, memory loss, substance abuse and suicidal ideas. The patient is nervous/anxious. The patient does not have insomnia.   All other systems reviewed and are negative.  Blood pressure 116/77, pulse 82, temperature 98.8 F (37.1 C), temperature source Oral, resp. rate 20, height 5\' 7"  (1.702 m), weight 106.1 kg, SpO2 100 %. Body mass index is 36.65 kg/m.   Treatment Plan Summary: Daily contact with patient to assess and evaluate symptoms and progress in treatment and Medication management  Physician Treatment Plan for Primary Diagnosis:  Assessment: Schizophrenia (HCC) Polysubstance use   Plan: Medications: Increase Abilify from 10 mg to 15 mg once daily for schizophrenia  Continue trazodone 50 mg p.o. q. nightly as needed for insomnia. Continue hydroxyzine 25 mg p.o. 3 times daily as needed for anxiety   Medications for medical conditions: Amlodipine Norvasc tablet 2.5 mg p.o. daily for high blood pressure Apixaban Eliquis tablet 5 mg p.o. every 12 hours as needed for PE Atorvastatin Lipitor tablet 40 mg p.o. daily for hyperlipidemia Nicotine NicoDerm CQ dose in milligrams per 24 hours  14 mg patch transdermal for smoking cessation Insulin glargine Semglee injection 20 units subcu daily for diabetes Potassium chloride 40 mill equivalent x 1 dose today 07/13/2022     Other PRN Medications -Acetaminophen 650 mg every 6 as needed/mild pain -Maalox 30 mL oral every 4 as needed/digestion -Magnesium hydroxide 30 mL daily as needed/mild constipation   -- The risks/benefits/side-effects/alternatives to this medication were discussed in detail with the patient and time was given for questions. The patient consents to medication trial.              --  Encouraged patient to participate  in unit milieu and in scheduled group therapies   ANTIPSYCHOTIC CONSENT : We discussed the risks, benefits, side effects, and alternatives to THE PRESCRIBED ANTIPSYCHOTIC ABILIFY, including but not limited to, the risk of fatigue, sedation, metabolic syndrome, weight gain, movement abnormalities such as tremor & cogwheeling & tardive dyskinesia, temperature sensitivity, photosensitivity, blood pressure changes, heart rhythm effects, potential for medication interactions, and to not take these medications with alcohol or illicit drugs; informed consent was obtained. We discussed the necessity for routine monitoring including rating scales of abnormal movements, blood work, and ekgs, while the patient is prescribed antipsychotic medication.     Safety and Monitoring: Voluntary admission to inpatient psychiatric unit for safety, stabilization and treatment Daily contact with patient to assess and evaluate symptoms and progress in treatment Patient's case to be discussed in multi-disciplinary team meeting Observation Level : q15 minute checks Vital signs: q12 hours Precautions: suicide, but pt currently verbally contracts for safety on unit    Discharge Planning: Social work and case management to assist with discharge planning and identification of hospital follow-up needs prior to discharge Estimated  LOS: DC LAN FOR SUNDAY 07-18-22 Discharge Concerns: Need to establish a safety plan; Medication compliance and effectiveness Discharge Goals: Return home with outpatient referrals for mental health follow-up including medication management/psychotherapy.    Cristy Hilts, MD 07/16/2022, 2:32 PM  Total Time Spent in Direct Patient Care:  I personally spent 35 minutes on the unit in direct patient care. The direct patient care time included face-to-face time with the patient, reviewing the patient's chart, communicating with other professionals, and coordinating care. Greater than 50% of this time was spent in counseling or coordinating care with the patient regarding goals of hospitalization, psycho-education, and discharge planning needs.   Phineas Inches, MD Psychiatrist

## 2022-07-16 NOTE — Group Note (Signed)
Date:  07/16/2022 Time:  4:34 PM  Group Topic/Focus:  Goals Group:   The focus of this group is to help patients establish daily goals to achieve during treatment and discuss how the patient can incorporate goal setting into their daily lives to aide in recovery. Orientation:   The focus of this group is to educate the patient on the purpose and policies of crisis stabilization and provide a format to answer questions about their admission.  The group details unit policies and expectations of patients while admitted.    Participation Level:  Did Not Attend  Participation Quality:   n/a  Affect:   n/a  Cognitive:   n/a  Insight: None  Engagement in Group:   n/a  Modes of Intervention:   n/a  Additional Comments:   Pt did not attend.  Edmund Hilda Noelia Lenart 07/16/2022, 4:34 PM

## 2022-07-16 NOTE — [facility] Counselor (Signed)
[facility] LCSW Progress Note   07/16/2022    2:39 PM  Forestine Chute      Type of Note: Residential List    CSW went to speak with patient to see if he made any calls to the provided resource for residential. Patient exact words, " who the hell are those people , and I could not here shit when calling them ". CSW then reviewed documents again with patient along with program details, location, and the insurance and patient exact word, " I do not want to go to [place] ". Patient was denied from [facility] and [facility] because of his insurance. Also, [facility] denied him because he is not a [place] resident.     Signed:   Jacob Moores, MSW, [facility] 07/16/2022 2:39 PM

## 2022-07-16 NOTE — Progress Notes (Signed)
Pt did attend AA group and actively participated.

## 2022-07-16 NOTE — Progress Notes (Signed)
   07/16/22 0900  Psych Admission Type (Psych Patients Only)  Admission Status Voluntary  Psychosocial Assessment  Patient Complaints Depression  Eye Contact Brief  Facial Expression Flat  Affect Depressed  Speech Logical/coherent  Interaction Minimal  Motor Activity Slow  Appearance/Hygiene Disheveled  Behavior Characteristics Cooperative  Mood Depressed  Thought Process  Coherency WDL  Content WDL  Delusions None reported or observed  Perception Hallucinations  Hallucination Visual;Auditory  Judgment Impaired  Confusion None  Danger to Self  Current suicidal ideation? Denies  Agreement Not to Harm Self Yes  Description of Agreement verbal  Danger to Others  Danger to Others None reported or observed

## 2022-07-16 NOTE — [facility] Suicide Risk Assessment (Signed)
[facility] INPATIENT:  Family/Significant Other Suicide Prevention Education  Suicide Prevention Education:  Education Completed; [name] ( brother ) [phone],  (name of family member/significant other) has been identified by the patient as the family member/significant other with whom the patient will be residing, and identified as the person(s) who will aid the patient in the event of a mental health crisis (suicidal ideations/suicide attempt).  With written consent from the patient, the family member/significant other has been provided the following suicide prevention education, prior to the and/or following the discharge of the patient.  Safety planning was completed with patient brother and brother stated that patient started being sneaky, taking the car , lying a lot , and returning back to the home with a strong drug odor. Brother states that right now  patient can return back to there mom home but at this time it is currently in the process of being sold since their mom is in the nursing home and has an outstanding balance that she owes. Patient does not own or have access to any guns or weapons.   The suicide prevention education provided includes the following: Suicide risk factors Suicide prevention and interventions National Suicide Hotline telephone number [facility] assessment telephone number [facility] Emergency Assistance 911 [facility] and/or Residential Mobile Crisis Unit telephone number  Request made of family/significant other to: Remove weapons (e.g., guns, rifles, knives), all items previously/currently identified as safety concern.   Remove drugs/medications (over-the-counter, prescriptions, illicit drugs), all items previously/currently identified as a safety concern.  The family member/significant other verbalizes understanding of the suicide prevention education information provided.  The family member/significant other agrees to remove the items of  safety concern listed above.  Henry Munoz 07/16/2022, 3:40 PM

## 2022-07-17 LAB — GLUCOSE, CAPILLARY
Glucose-Capillary: 202 mg/dL — ABNORMAL HIGH (ref 70–99)
Glucose-Capillary: 283 mg/dL — ABNORMAL HIGH (ref 70–99)
Glucose-Capillary: 297 mg/dL — ABNORMAL HIGH (ref 70–99)
Glucose-Capillary: 268 mg/dL — ABNORMAL HIGH (ref 70–99)

## 2022-07-17 NOTE — Group Note (Signed)
Date:  07/17/2022 Time:  4:55 PM  Group Topic/Focus:  Dimensions of Wellness:   The focus of this group is to introduce the topic of wellness and discuss the role each dimension of wellness plays in total health.    Participation Level:  Did Not Attend  Participation Quality:   n/a  Affect:   n/a  Cognitive:   n/a  Insight: None  Engagement in Group:   n/a  Modes of Intervention:   n/a  Additional Comments:   Pt did not attend.  Edmund Hilda Jillene Wehrenberg 07/17/2022, 4:55 PM

## 2022-07-17 NOTE — Progress Notes (Signed)
Pt's BP was 162/106 and was asymptomatic. Notified on-call NP. Given a one time dose of Hydralazine per MAR. Rechecked BP and it was 156/87.

## 2022-07-17 NOTE — Group Note (Signed)
Date:  07/17/2022 Time:  6:24 PM  Group Topic/Focus:  Support and Check in- Open Discussion/Journaling    Participation Level:  Minimal  Participation Quality:  Appropriate  Affect:  Appropriate  Cognitive:  Appropriate  Insight: Appropriate  Engagement in Group:  Engaged  Modes of Intervention:  Activity, Discussion, and Support  Additional Comments:   Pt attended and participated in the Support Check in, journaling and open discussion group.  Edmund Hilda Deette Revak 07/17/2022, 6:24 PM

## 2022-07-17 NOTE — Progress Notes (Signed)
   07/17/22 0600  15 Minute Checks  Location Bedroom  Visual Appearance Calm  Behavior Sleeping  Sleep (Behavioral Health Patients Only)  Calculate sleep? (Click Yes once per 24 hr at 0600 safety check) Yes  Documented sleep last 24 hours 8

## 2022-07-17 NOTE — Progress Notes (Signed)
   07/17/22 1101  Psych Admission Type (Psych Patients Only)  Admission Status Voluntary  Psychosocial Assessment  Patient Complaints Depression;Worrying;Hopelessness  Eye Contact Brief  Facial Expression Flat  Affect Depressed  Speech Logical/coherent  Interaction Assertive;Minimal  Motor Activity Slow  Behavior Characteristics Agitated;Resistant to care  Mood Depressed;Irritable  Thought Process  Coherency WDL  Content WDL  Delusions None reported or observed  Perception WDL  Hallucination None reported or observed  Judgment Impaired  Confusion None  Danger to Self  Current suicidal ideation? Denies  Self-Injurious Behavior No self-injurious ideation or behavior indicators observed or expressed   Agreement Not to Harm Self Yes  Description of Agreement verbal  Danger to Others  Danger to Others None reported or observed

## 2022-07-17 NOTE — Group Note (Signed)
Date:  07/17/2022 Time:  4:36 PM  Group Topic/Focus:  Goals Group:   The focus of this group is to help patients establish daily goals to achieve during treatment and discuss how the patient can incorporate goal setting into their daily lives to aide in recovery. Orientation:   The focus of this group is to educate the patient on the purpose and policies of crisis stabilization and provide a format to answer questions about their admission.  The group details unit policies and expectations of patients while admitted.    Participation Level:  Did Not Attend  Participation Quality:   n/a  Affect:   n/a  Cognitive:   n/a  Insight: None  Engagement in Group:   n/a  Modes of Intervention:   n/a  Additional Comments:   Pt did not attend.  Henry Munoz 07/17/2022, 4:36 PM

## 2022-07-17 NOTE — Progress Notes (Addendum)
   07/16/22 2155  Psych Admission Type (Psych Patients Only)  Admission Status Voluntary  Psychosocial Assessment  Patient Complaints Depression  Eye Contact Brief  Facial Expression Flat  Affect Depressed  Speech Logical/coherent  Interaction Assertive;Minimal;Guarded  Motor Activity Slow  Appearance/Hygiene Disheveled  Behavior Characteristics Cooperative;Calm  Mood Depressed  Thought Process  Coherency WDL  Content WDL  Delusions None reported or observed  Perception WDL  Hallucination None reported or observed  Judgment Impaired  Confusion None  Danger to Self  Current suicidal ideation? Denies  Agreement Not to Harm Self Yes  Description of Agreement Verbal contract for safety  Danger to Others  Danger to Others None reported or observed   D: Pt denies SI/HI/AVH.  A: Pt was offered support and encouragement. Pt was given scheduled medications. Given PRN Trazodone per MAR. Q 15 minute checks were done for safety.  R:Pt attended group and interacts well with peers and staff. Pt has no complaints.Pt receptive to treatment and safety maintained on unit.

## 2022-07-17 NOTE — Progress Notes (Signed)
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 Patient presents voluntarily to [facility] from [facility] after stabilization for suicidal ideation x 2 weeks with plans to harm himself or steal from people in the context of crack cocaine smoking.   On assessment today, the patient reports auditory hallucinations are much less, barely audible, and much less frequent.  Denies any CAH.  Denies having any visual hallucinations this morning.  Reports tolerating increased dose of about Abilify well without side effects.  Reports that mood is better.  Reports anxiety is less.  Reports that sleep is adequate.  Reports appetite is okay.  Reports concentration is okay.  Denies any SI or HI.  Denies any paranoia.  Patient states that social work sent a referral for [facility] yesterday, and that we should hear back from them on Monday.  Patient agreeable to remain in the hospital until we hear back about this residential substance use treatment option.    Principal Problem: Schizophrenia (HCC) Diagnosis: Principal Problem:   Schizophrenia (HCC)  Total Time spent with patient: 20 minutes  Past Psychiatric History:  Previous Psych Diagnoses: Schizophrenia, alcohol use dependence, auditory hallucination, cocaine abuse, MDD, suicide ideation. Prior inpatient treatment: Yes, seen at [facility] Current/prior outpatient treatment: Denies Prior rehab hx: Denies Psychotherapy hx: Yes History of suicide: Yes x 2 History of homicide or aggression: Denies Psychiatric medication history: Yes, being treated with Seroquel while in prison. Psychiatric  medication compliance history: Noncompliance Neuromodulation history: Denies Current Psychiatrist: Denies Current therapist: Denies   Past Medical History:  Past Medical History:  Diagnosis Date   Alcohol abuse    Cocaine abuse (HCC)    Diabetes mellitus without complication (HCC)    Diastolic dysfunction    a. 06/2022 Echo (in setting of acute PE):  EF 50-55%, no rwma, mod-sev LVH, grI DD, mod reduced RV fxn, triv MR w/ MVP of post leaflet, ? bicuspid AoV w/ triv AI and Ao sclerosis.   Hypertension    Pulmonary embolism (HCC)    a. 06/2022 CTA: Large volume PE affecting all lobar braches w/ R heart strain-->s/p mech thrombectomy.   Schizo-affective schizophrenia (HCC)    Stroke [facility])    TIA (transient ischemic attack)    Tobacco abuse     Past Surgical History:  Procedure Laterality Date   PULMONARY THROMBECTOMY Bilateral 06/30/2022   Procedure: PULMONARY THROMBECTOMY;  Surgeon: Renford Dills, MD;  Location: [facility] INVASIVE CV LAB;  Service: Cardiovascular;  Laterality: Bilateral;   Family History:  Family History  Problem Relation Age of Onset   High blood pressure Father    Diabetes Father    Renal Disease Father    Family Psychiatric  History: See H&P  Social History:  Social History   Substance and Sexual Activity  Alcohol Use Yes   Alcohol/week: 2.0 standard drinks of alcohol   Types: 2 Cans of beer per week   Comment: per week. 07/11/22 states does not drink     Social History   Substance and Sexual Activity  Drug Use Yes   Types: Cocaine   Comment: Uses weekly. also crack    Social History   Socioeconomic History   Marital status: Single  Spouse name: Not on file   Number of children: Not on file   Years of education: Not on file   Highest education level: Not on file  Occupational History   Not on file  Tobacco Use   Smoking status: Every Day    Packs/day: 0.50    Years: 15.00    Additional pack years: 0.00    Total pack years: 7.50     Types: Cigarettes   Smokeless tobacco: Never  Substance and Sexual Activity   Alcohol use: Yes    Alcohol/week: 2.0 standard drinks of alcohol    Types: 2 Cans of beer per week    Comment: per week. 07/11/22 states does not drink   Drug use: Yes    Types: Cocaine    Comment: Uses weekly. also crack   Sexual activity: Not on file  Other Topics Concern   Not on file  Social History Narrative   Lives locally.  Does not routinely exercise.   Social Determinants of Health   Financial Resource Strain: Low Risk  (10/13/2017)   Overall Financial Resource Strain (CARDIA)    Difficulty of Paying Living Expenses: Not hard at all  Food Insecurity: No Food Insecurity (07/12/2022)   Hunger Vital Sign    Worried About Running Out of Food in the Last Year: Never true    Ran Out of Food in the Last Year: Never true  Transportation Needs: No Transportation Needs (07/12/2022)   PRAPARE - Administrator, Civil Service (Medical): No    Lack of Transportation (Non-Medical): No  Recent Concern: Transportation Needs - Unmet Transportation Needs (07/04/2022)   PRAPARE - Transportation    Lack of Transportation (Medical): Yes    Lack of Transportation (Non-Medical): Yes  Physical Activity: Inactive (10/13/2017)   Exercise Vital Sign    Days of Exercise per Week: 0 days    Minutes of Exercise per Session: 0 min  Stress: No Stress Concern Present (10/13/2017)   [employer] of Occupational Health - Occupational Stress Questionnaire    Feeling of Stress : Not at all  Social Connections: Somewhat Isolated (10/13/2017)   Social Connection and Isolation Panel [NHANES]    Frequency of Communication with Friends and Family: Three times a week    Frequency of Social Gatherings with Friends and Family: Never    Attends Religious Services: 1 to 4 times per year    Active Member of [employer] or Organizations: No    Attends Banker Meetings: Never    Marital Status: Separated   Additional Social  History:                           Current Medications: Current Facility-Administered Medications  Medication Dose Route Frequency Provider Last Rate Last Admin   acetaminophen (TYLENOL) tablet 650 mg  650 mg Oral Q6H PRN Jearld Lesch, NP   650 mg at 07/17/22 0916   alum & mag hydroxide-simeth (MAALOX/MYLANTA) 200-200-20 MG/5ML suspension 30 mL  30 mL Oral Q4H PRN Jearld Lesch, NP       amLODipine (NORVASC) tablet 2.5 mg  2.5 mg Oral Daily Dixon, Rashaun M, NP   2.5 mg at 07/17/22 0909   apixaban (ELIQUIS) tablet 5 mg  5 mg Oral Q12H Green, Terri L, RPH   5 mg at 07/17/22 9604   ARIPiprazole (ABILIFY) tablet 15 mg  15 mg Oral Daily Irean Kendricks, Harrold Donath, MD   15 mg at 07/17/22  0910   atorvastatin (LIPITOR) tablet 40 mg  40 mg Oral Daily Jearld Lesch, NP   40 mg at 07/17/22 0910   diphenhydrAMINE (BENADRYL) capsule 50 mg  50 mg Oral TID PRN Jearld Lesch, NP       Or   diphenhydrAMINE (BENADRYL) injection 50 mg  50 mg Intramuscular TID PRN Jearld Lesch, NP       haloperidol (HALDOL) tablet 5 mg  5 mg Oral TID PRN Jearld Lesch, NP       Or   haloperidol lactate (HALDOL) injection 5 mg  5 mg Intramuscular TID PRN Jearld Lesch, NP       hydrOXYzine (ATARAX) tablet 25 mg  25 mg Oral TID PRN Jearld Lesch, NP   25 mg at 07/16/22 1829   insulin aspart (novoLOG) injection 0-5 Units  0-5 Units Subcutaneous QHS Phineas Inches, MD   3 Units at 07/16/22 2159   insulin aspart (novoLOG) injection 0-6 Units  0-6 Units Subcutaneous TID WC Phineas Inches, MD   2 Units at 07/17/22 1610   insulin aspart (novoLOG) injection 6 Units  6 Units Subcutaneous TID WC Anaclara Acklin, Harrold Donath, MD   6 Units at 07/17/22 9604   insulin glargine-yfgn (SEMGLEE) injection 22 Units  22 Units Subcutaneous Daily Cavon Nicolls, Harrold Donath, MD   22 Units at 07/17/22 0911   LORazepam (ATIVAN) tablet 2 mg  2 mg Oral TID PRN Jearld Lesch, NP       Or   LORazepam (ATIVAN) injection 2 mg  2 mg  Intramuscular TID PRN Jearld Lesch, NP       magnesium hydroxide (MILK OF MAGNESIA) suspension 30 mL  30 mL Oral Daily PRN Dixon, Rashaun M, NP       nicotine (NICODERM CQ - dosed in mg/24 hours) patch 14 mg  14 mg Transdermal Daily Dynasia Kercheval, Harrold Donath, MD   14 mg at 07/17/22 0910   traZODone (DESYREL) tablet 50 mg  50 mg Oral QHS PRN Jearld Lesch, NP   50 mg at 07/16/22 2157    Lab Results:  Results for orders placed or performed during the hospital encounter of 07/12/22 (from the past 48 hour(s))  Glucose, capillary     Status: Abnormal   Collection Time: 07/15/22 11:29 AM  Result Value Ref Range   Glucose-Capillary 240 (H) 70 - 99 mg/dL    Comment: Glucose reference range applies only to samples taken after fasting for at least 8 hours.  Glucose, capillary     Status: Abnormal   Collection Time: 07/15/22  5:24 PM  Result Value Ref Range   Glucose-Capillary 254 (H) 70 - 99 mg/dL    Comment: Glucose reference range applies only to samples taken after fasting for at least 8 hours.  Glucose, capillary     Status: Abnormal   Collection Time: 07/15/22  8:58 PM  Result Value Ref Range   Glucose-Capillary 288 (H) 70 - 99 mg/dL    Comment: Glucose reference range applies only to samples taken after fasting for at least 8 hours.   Comment 1 Notify RN    Comment 2 Document in Chart   Glucose, capillary     Status: Abnormal   Collection Time: 07/16/22  5:34 AM  Result Value Ref Range   Glucose-Capillary 117 (H) 70 - 99 mg/dL    Comment: Glucose reference range applies only to samples taken after fasting for at least 8 hours.  Glucose, capillary     Status:  Abnormal   Collection Time: 07/16/22 11:51 AM  Result Value Ref Range   Glucose-Capillary 222 (H) 70 - 99 mg/dL    Comment: Glucose reference range applies only to samples taken after fasting for at least 8 hours.  Glucose, capillary     Status: Abnormal   Collection Time: 07/16/22  4:54 PM  Result Value Ref Range    Glucose-Capillary 225 (H) 70 - 99 mg/dL    Comment: Glucose reference range applies only to samples taken after fasting for at least 8 hours.  Glucose, capillary     Status: Abnormal   Collection Time: 07/16/22  9:18 PM  Result Value Ref Range   Glucose-Capillary 251 (H) 70 - 99 mg/dL    Comment: Glucose reference range applies only to samples taken after fasting for at least 8 hours.  Glucose, capillary     Status: Abnormal   Collection Time: 07/17/22  5:45 AM  Result Value Ref Range   Glucose-Capillary 202 (H) 70 - 99 mg/dL    Comment: Glucose reference range applies only to samples taken after fasting for at least 8 hours.    Blood Alcohol level:  Lab Results  Component Value Date   ETH <10 07/11/2022   ETH <10 06/29/2022    Metabolic Disorder Labs: Lab Results  Component Value Date   HGBA1C 14.0 (H) 06/29/2022   MPG 355.1 06/29/2022   MPG 315 08/10/2016   No results found for: "PROLACTIN" Lab Results  Component Value Date   CHOL 251 (H) 06/29/2022   TRIG 96 06/29/2022   HDL 26 (L) 06/29/2022   CHOLHDL 9.7 06/29/2022   VLDL 19 06/29/2022   LDLCALC 206 (H) 06/29/2022   LDLCALC 152 (H) 08/10/2016    Physical Findings: AIMS:  , ,  ,  ,    CIWA:    COWS:     Aims score zero on my exam. No eps on my exam.  Musculoskeletal: Strength & Muscle Tone: within normal limits Gait & Station: normal Patient leans: N/A  Psychiatric Specialty Exam:  Presentation  General Appearance:  Casual; Fairly Groomed  Eye Contact: Fair  Speech: Normal Rate  Speech Volume: Normal  Handedness: Right   Mood and Affect  Mood: Anxious  Affect: Congruent   Thought Process  Thought Processes: Linear  Descriptions of Associations:Intact  Orientation:Full (Time, Place and Person)  Thought Content:Logical  History of Schizophrenia/Schizoaffective disorder:Yes  Duration of Psychotic Symptoms:Greater than six months  Hallucinations:Hallucinations: AH is less.  Denying VH today.   Ideas of Reference: Denies Paranoia  Suicidal Thoughts:Suicidal Thoughts: No  Homicidal Thoughts:Homicidal Thoughts: No   Sensorium  Memory: Immediate Fair; Recent Fair; Remote Fair  Judgment: Fair  Insight: Fair   Chartered certified accountant: Fair  Attention Span: Fair  Recall: [employer] of Knowledge: Fair  Language: Fair   Psychomotor Activity  Psychomotor Activity: Psychomotor Activity: Normal   Assets  Assets: Manufacturing systems engineer; Desire for Improvement; Housing; Physical Health; Resilience   Sleep  Sleep: Sleep: Fair    Physical Exam: Physical Exam Vitals reviewed.  Constitutional:      General: He is not in acute distress.    Appearance: He is not toxic-appearing.  Pulmonary:     Effort: Pulmonary effort is normal. No respiratory distress.  Neurological:     Mental Status: He is alert.     Motor: No weakness.     Gait: Gait normal.  Psychiatric:        Behavior: Behavior normal.  Judgment: Judgment normal.    Review of Systems  Constitutional:  Negative for chills and fever.  Cardiovascular:  Negative for chest pain and palpitations.  Neurological:  Negative for dizziness, tingling, tremors and headaches.  Psychiatric/Behavioral:  Positive for hallucinations. Negative for depression, memory loss, substance abuse and suicidal ideas. The patient is nervous/anxious. The patient does not have insomnia.   All other systems reviewed and are negative.  Blood pressure (!) 150/91, pulse 83, temperature 98.2 F (36.8 C), temperature source Oral, resp. rate 20, height 5\' 7"  (1.702 m), weight 106.1 kg, SpO2 98 %. Body mass index is 36.65 kg/m.   Treatment Plan Summary: Daily contact with patient to assess and evaluate symptoms and progress in treatment and Medication management  Physician Treatment Plan for Primary Diagnosis:  Assessment: Schizophrenia (HCC) Polysubstance use    Plan: Medications: Continue Abilify 15 mg once daily for schizophrenia  Continue trazodone 50 mg p.o. q. nightly as needed for insomnia. Continue hydroxyzine 25 mg p.o. 3 times daily as needed for anxiety   Medications for medical conditions: Amlodipine Norvasc tablet 2.5 mg p.o. daily for high blood pressure Apixaban Eliquis tablet 5 mg p.o. every 12 hours as needed for PE Atorvastatin Lipitor tablet 40 mg p.o. daily for hyperlipidemia Nicotine NicoDerm CQ dose in milligrams per 24 hours 14 mg patch transdermal for smoking cessation Insulin glargine Semglee injection 20 units subcu daily for diabetes Potassium chloride 40 mill equivalent x 1 dose today 07/13/2022     Other PRN Medications -Acetaminophen 650 mg every 6 as needed/mild pain -Maalox 30 mL oral every 4 as needed/digestion -Magnesium hydroxide 30 mL daily as needed/mild constipation   -- The risks/benefits/side-effects/alternatives to this medication were discussed in detail with the patient and time was given for questions. The patient consents to medication trial.              -- Encouraged patient to participate  in unit milieu and in scheduled group therapies      Safety and Monitoring: Voluntary admission to inpatient psychiatric unit for safety, stabilization and treatment Daily contact with patient to assess and evaluate symptoms and progress in treatment Patient's case to be discussed in multi-disciplinary team meeting Observation Level : q15 minute checks Vital signs: q12 hours Precautions: suicide, but pt currently verbally contracts for safety on unit    Discharge Planning: Social work and case management to assist with discharge planning and identification of hospital follow-up needs prior to discharge Estimated LOS: Discharge plan pending, the patient is accepted to [facility] for residential substance use treatment Discharge Concerns: Need to establish a safety plan; Medication compliance and  effectiveness Discharge Goals: Return home with outpatient referrals for mental health follow-up including medication management/psychotherapy.    Cristy Hilts, MD 07/17/2022, 10:20 AM  Total Time Spent in Direct Patient Care:  I personally spent 30 minutes on the unit in direct patient care. The direct patient care time included face-to-face time with the patient, reviewing the patient's chart, communicating with other professionals, and coordinating care. Greater than 50% of this time was spent in counseling or coordinating care with the patient regarding goals of hospitalization, psycho-education, and discharge planning needs.   Phineas Inches, MD Psychiatrist

## 2022-07-17 NOTE — Group Note (Signed)
Date:  07/17/2022 Time:  9:43 PM Pt attended and participated in wrap up group this evening, but opted not to participate.   Chrisandra Netters 07/17/2022, 9:43 PM

## 2022-07-18 LAB — GLUCOSE, CAPILLARY
Glucose-Capillary: 159 mg/dL — ABNORMAL HIGH (ref 70–99)
Glucose-Capillary: 192 mg/dL — ABNORMAL HIGH (ref 70–99)
Glucose-Capillary: 346 mg/dL — ABNORMAL HIGH (ref 70–99)
Glucose-Capillary: 268 mg/dL — ABNORMAL HIGH (ref 70–99)

## 2022-07-18 LAB — SARS CORONAVIRUS 2 BY RT PCR: SARS Coronavirus 2 by RT PCR: NEGATIVE

## 2022-07-18 MED ORDER — CLONIDINE HCL 0.1 MG PO TABS
0.1000 mg | ORAL_TABLET | ORAL | Status: DC | PRN
  Administered 2022-07-19: 0.1 mg via ORAL
  Filled 2022-07-18: qty 1

## 2022-07-18 MED ORDER — AMLODIPINE BESYLATE 5 MG PO TABS
5.0000 mg | ORAL_TABLET | Freq: Every day | ORAL | Status: DC
  Administered 2022-07-19: 5 mg via ORAL
  Filled 2022-07-18 (×2): qty 1

## 2022-07-18 NOTE — Progress Notes (Signed)
The patient attended group but had little to share.

## 2022-07-18 NOTE — Group Note (Signed)
Date:  07/18/2022 Time:  4:33 PM  Group Topic/Focus:  Goals Group:   The focus of this group is to help patients establish daily goals to achieve during treatment and discuss how the patient can incorporate goal setting into their daily lives to aide in recovery. Orientation:   The focus of this group is to educate the patient on the purpose and policies of crisis stabilization and provide a format to answer questions about their admission.  The group details unit policies and expectations of patients while admitted.    Participation Level:  Active  Participation Quality:  Appropriate  Affect:  Appropriate  Cognitive:  Appropriate  Insight: Appropriate  Engagement in Group:  Engaged  Modes of Intervention:  Activity, Discussion, and Orientation  Additional Comments:   Pt attended and participated in the Orientation/Goals group.   Edmund Hilda Yomar Mejorado 07/18/2022, 4:33 PM

## 2022-07-18 NOTE — Progress Notes (Signed)
   07/18/22 0800  Psych Admission Type (Psych Patients Only)  Admission Status Voluntary  Psychosocial Assessment  Patient Complaints Anxiety;Sadness  Eye Contact Fair  Facial Expression Anxious  Affect Anxious  Speech Logical/coherent  Interaction Poor  Motor Activity Slow  Appearance/Hygiene Improved  Behavior Characteristics Anxious  Mood Depressed  Thought Process  Coherency WDL  Content WDL  Delusions None reported or observed  Perception WDL  Hallucination None reported or observed  Judgment Impaired  Confusion None  Danger to Self  Current suicidal ideation? Denies  Self-Injurious Behavior No self-injurious ideation or behavior indicators observed or expressed   Agreement Not to Harm Self Yes  Description of Agreement Verbal  Danger to Others  Danger to Others None reported or observed

## 2022-07-18 NOTE — Progress Notes (Signed)
D: Pt alert and oriented.Pt denies experiencing any SI/HI, or AVH at this time.   A: Scheduled medications administered to pt, per MD orders. Support and encouragement provided. Frequent verbal contact made. Routine safety checks conducted q15 minutes.   R: No adverse drug reactions noted. Pt verbally contracts for safety at this time. Pt complaint with medications and treatment plan. Pt interacts well with others on the unit. Pt remains safe at this time. Will continue to monitor.  07/18/22 0100  Psych Admission Type (Psych Patients Only)  Admission Status Voluntary  Psychosocial Assessment  Patient Complaints Worrying  Eye Contact Fair  Facial Expression Anxious  Affect Anxious  Speech Logical/coherent  Interaction Superficial  Motor Activity Slow  Appearance/Hygiene In hospital gown  Behavior Characteristics Anxious  Mood Depressed  Thought Process  Coherency WDL  Content WDL  Delusions None reported or observed  Perception WDL  Hallucination None reported or observed  Judgment Impaired  Confusion None  Danger to Self  Current suicidal ideation? Denies  Self-Injurious Behavior No self-injurious ideation or behavior indicators observed or expressed   Agreement Not to Harm Self Yes  Description of Agreement verbal  Danger to Others  Danger to Others None reported or observed

## 2022-07-18 NOTE — Progress Notes (Signed)
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 Patient presents voluntarily to [facility] from [facility] after stabilization for suicidal ideation x 2 weeks with plans to harm himself or steal from people in the context of crack cocaine smoking.   On assessment today, the patient reports auditory hallucinations have resolved completely.  Denies any CAH.  Denies having any visual hallucinations since evaluation yesterday.   Reports tolerating increased dose of about Abilify well without side effects.  Reports that mood is better.  Reports anxiety is less.  Reports that sleep is adequate.  Reports appetite is okay.  Reports concentration is okay.  Denies any SI or HI.  Denies any paranoia.  Patient states that social work sent a referral for [facility] on Friday, and that we should hear back from them on Monday.  Patient agreeable to remain in the hospital until we hear back about this residential substance use treatment option.  Pt c/o rhinorrhea, muscle stiffness, cough, fatigue - will order covid testing.     Principal Problem: Schizophrenia (HCC) Diagnosis: Principal Problem:   Schizophrenia (HCC)  Total Time spent with patient: 20 minutes  Past Psychiatric History:  Previous Psych Diagnoses: Schizophrenia, alcohol use dependence, auditory hallucination, cocaine abuse, MDD, suicide ideation. Prior inpatient treatment: Yes, seen at [facility] Current/prior outpatient treatment: Denies Prior rehab hx: Denies Psychotherapy hx: Yes History of suicide: Yes x 2 History of homicide or aggression: Denies Psychiatric medication history:  Yes, being treated with Seroquel while in prison. Psychiatric medication compliance history: Noncompliance Neuromodulation history: Denies Current Psychiatrist: Denies Current therapist: Denies   Past Medical History:  Past Medical History:  Diagnosis Date   Alcohol abuse    Cocaine abuse (HCC)    Diabetes mellitus without complication (HCC)    Diastolic dysfunction    a. 06/2022 Echo (in setting of acute PE):  EF 50-55%, no rwma, mod-sev LVH, grI DD, mod reduced RV fxn, triv MR w/ MVP of post leaflet, ? bicuspid AoV w/ triv AI and Ao sclerosis.   Hypertension    Pulmonary embolism (HCC)    a. 06/2022 CTA: Large volume PE affecting all lobar braches w/ R heart strain-->s/p mech thrombectomy.   Schizo-affective schizophrenia (HCC)    Stroke [facility])    TIA (transient ischemic attack)    Tobacco abuse     Past Surgical History:  Procedure Laterality Date   PULMONARY THROMBECTOMY Bilateral 06/30/2022   Procedure: PULMONARY THROMBECTOMY;  Surgeon: Renford Dills, MD;  Location: [facility] INVASIVE CV LAB;  Service: Cardiovascular;  Laterality: Bilateral;   Family History:  Family History  Problem Relation Age of Onset   High blood pressure Father    Diabetes Father    Renal Disease Father    Family Psychiatric  History: See H&P  Social History:  Social History   Substance and Sexual Activity  Alcohol Use Yes   Alcohol/week: 2.0 standard drinks of alcohol   Types: 2 Cans of beer per week   Comment: per week. 07/11/22 states does not drink     Social History   Substance and Sexual Activity  Drug Use Yes   Types: Cocaine   Comment: Uses weekly. also crack    Social History  Socioeconomic History   Marital status: Single    Spouse name: Not on file   Number of children: Not on file   Years of education: Not on file   Highest education level: Not on file  Occupational History   Not on file  Tobacco Use   Smoking status: Every Day    Packs/day: 0.50    Years: 15.00     Additional pack years: 0.00    Total pack years: 7.50    Types: Cigarettes   Smokeless tobacco: Never  Substance and Sexual Activity   Alcohol use: Yes    Alcohol/week: 2.0 standard drinks of alcohol    Types: 2 Cans of beer per week    Comment: per week. 07/11/22 states does not drink   Drug use: Yes    Types: Cocaine    Comment: Uses weekly. also crack   Sexual activity: Not on file  Other Topics Concern   Not on file  Social History Narrative   Lives locally.  Does not routinely exercise.   Social Determinants of Health   Financial Resource Strain: Low Risk  (10/13/2017)   Overall Financial Resource Strain (CARDIA)    Difficulty of Paying Living Expenses: Not hard at all  Food Insecurity: No Food Insecurity (07/12/2022)   Hunger Vital Sign    Worried About Running Out of Food in the Last Year: Never true    Ran Out of Food in the Last Year: Never true  Transportation Needs: No Transportation Needs (07/12/2022)   PRAPARE - Administrator, Civil Service (Medical): No    Lack of Transportation (Non-Medical): No  Recent Concern: Transportation Needs - Unmet Transportation Needs (07/04/2022)   PRAPARE - Transportation    Lack of Transportation (Medical): Yes    Lack of Transportation (Non-Medical): Yes  Physical Activity: Inactive (10/13/2017)   Exercise Vital Sign    Days of Exercise per Week: 0 days    Minutes of Exercise per Session: 0 min  Stress: No Stress Concern Present (10/13/2017)   [employer] of Occupational Health - Occupational Stress Questionnaire    Feeling of Stress : Not at all  Social Connections: Somewhat Isolated (10/13/2017)   Social Connection and Isolation Panel [NHANES]    Frequency of Communication with Friends and Family: Three times a week    Frequency of Social Gatherings with Friends and Family: Never    Attends Religious Services: 1 to 4 times per year    Active Member of [employer] or Organizations: No    Attends Engineer, structural:  Never    Marital Status: Separated   Additional Social History:                           Current Medications: Current Facility-Administered Medications  Medication Dose Route Frequency Provider Last Rate Last Admin   acetaminophen (TYLENOL) tablet 650 mg  650 mg Oral Q6H PRN Jearld Lesch, NP   650 mg at 07/17/22 2044   alum & mag hydroxide-simeth (MAALOX/MYLANTA) 200-200-20 MG/5ML suspension 30 mL  30 mL Oral Q4H PRN Jearld Lesch, NP       [START ON 07/19/2022] amLODipine (NORVASC) tablet 5 mg  5 mg Oral Daily Brinae Woods, MD       apixaban (ELIQUIS) tablet 5 mg  5 mg Oral Q12H Green, Terri L, RPH   5 mg at 07/18/22 1610   ARIPiprazole (ABILIFY) tablet 15 mg  15 mg Oral Daily  Phineas Inches, MD   15 mg at 07/18/22 1610   atorvastatin (LIPITOR) tablet 40 mg  40 mg Oral Daily Jearld Lesch, NP   40 mg at 07/18/22 9604   cloNIDine (CATAPRES) tablet 0.1 mg  0.1 mg Oral Q4H PRN Tiegan Terpstra, Harrold Donath, MD       diphenhydrAMINE (BENADRYL) capsule 50 mg  50 mg Oral TID PRN Jearld Lesch, NP       Or   diphenhydrAMINE (BENADRYL) injection 50 mg  50 mg Intramuscular TID PRN Jearld Lesch, NP       haloperidol (HALDOL) tablet 5 mg  5 mg Oral TID PRN Jearld Lesch, NP       Or   haloperidol lactate (HALDOL) injection 5 mg  5 mg Intramuscular TID PRN Jearld Lesch, NP       hydrOXYzine (ATARAX) tablet 25 mg  25 mg Oral TID PRN Jearld Lesch, NP   25 mg at 07/16/22 1829   insulin aspart (novoLOG) injection 0-5 Units  0-5 Units Subcutaneous QHS Phineas Inches, MD   3 Units at 07/17/22 2044   insulin aspart (novoLOG) injection 0-6 Units  0-6 Units Subcutaneous TID WC Phineas Inches, MD   1 Units at 07/18/22 0713   insulin aspart (novoLOG) injection 6 Units  6 Units Subcutaneous TID WC Phineas Inches, MD   6 Units at 07/18/22 5409   insulin glargine-yfgn (SEMGLEE) injection 22 Units  22 Units Subcutaneous Daily Anatole Apollo, Harrold Donath, MD   22 Units at  07/18/22 0824   LORazepam (ATIVAN) tablet 2 mg  2 mg Oral TID PRN Jearld Lesch, NP       Or   LORazepam (ATIVAN) injection 2 mg  2 mg Intramuscular TID PRN Jearld Lesch, NP       magnesium hydroxide (MILK OF MAGNESIA) suspension 30 mL  30 mL Oral Daily PRN Dixon, Rashaun M, NP       nicotine (NICODERM CQ - dosed in mg/24 hours) patch 14 mg  14 mg Transdermal Daily Lewie Deman, Harrold Donath, MD   14 mg at 07/18/22 8119   traZODone (DESYREL) tablet 50 mg  50 mg Oral QHS PRN Jearld Lesch, NP   50 mg at 07/17/22 2044    Lab Results:  Results for orders placed or performed during the hospital encounter of 07/12/22 (from the past 48 hour(s))  Glucose, capillary     Status: Abnormal   Collection Time: 07/16/22 11:51 AM  Result Value Ref Range   Glucose-Capillary 222 (H) 70 - 99 mg/dL    Comment: Glucose reference range applies only to samples taken after fasting for at least 8 hours.  Glucose, capillary     Status: Abnormal   Collection Time: 07/16/22  4:54 PM  Result Value Ref Range   Glucose-Capillary 225 (H) 70 - 99 mg/dL    Comment: Glucose reference range applies only to samples taken after fasting for at least 8 hours.  Glucose, capillary     Status: Abnormal   Collection Time: 07/16/22  9:18 PM  Result Value Ref Range   Glucose-Capillary 251 (H) 70 - 99 mg/dL    Comment: Glucose reference range applies only to samples taken after fasting for at least 8 hours.  Glucose, capillary     Status: Abnormal   Collection Time: 07/17/22  5:45 AM  Result Value Ref Range   Glucose-Capillary 202 (H) 70 - 99 mg/dL    Comment: Glucose reference range applies only to samples taken after  fasting for at least 8 hours.  Glucose, capillary     Status: Abnormal   Collection Time: 07/17/22 11:54 AM  Result Value Ref Range   Glucose-Capillary 283 (H) 70 - 99 mg/dL    Comment: Glucose reference range applies only to samples taken after fasting for at least 8 hours.  Glucose, capillary     Status:  Abnormal   Collection Time: 07/17/22  4:56 PM  Result Value Ref Range   Glucose-Capillary 297 (H) 70 - 99 mg/dL    Comment: Glucose reference range applies only to samples taken after fasting for at least 8 hours.  Glucose, capillary     Status: Abnormal   Collection Time: 07/17/22  8:32 PM  Result Value Ref Range   Glucose-Capillary 268 (H) 70 - 99 mg/dL    Comment: Glucose reference range applies only to samples taken after fasting for at least 8 hours.  Glucose, capillary     Status: Abnormal   Collection Time: 07/18/22  5:42 AM  Result Value Ref Range   Glucose-Capillary 159 (H) 70 - 99 mg/dL    Comment: Glucose reference range applies only to samples taken after fasting for at least 8 hours.    Blood Alcohol level:  Lab Results  Component Value Date   ETH <10 07/11/2022   ETH <10 06/29/2022    Metabolic Disorder Labs: Lab Results  Component Value Date   HGBA1C 14.0 (H) 06/29/2022   MPG 355.1 06/29/2022   MPG 315 08/10/2016   No results found for: "PROLACTIN" Lab Results  Component Value Date   CHOL 251 (H) 06/29/2022   TRIG 96 06/29/2022   HDL 26 (L) 06/29/2022   CHOLHDL 9.7 06/29/2022   VLDL 19 06/29/2022   LDLCALC 206 (H) 06/29/2022   LDLCALC 152 (H) 08/10/2016    Physical Findings: AIMS:  , ,  ,  ,    CIWA:    COWS:     Aims score zero on my exam. No eps on my exam.  Musculoskeletal: Strength & Muscle Tone: within normal limits Gait & Station: normal Patient leans: N/A  Psychiatric Specialty Exam:  Presentation  General Appearance:  Casual; Fairly Groomed  Eye Contact: Fair  Speech: Normal Rate  Speech Volume: Normal  Handedness: Right   Mood and Affect  Mood: Anxious  Affect: Congruent   Thought Process  Thought Processes: Linear  Descriptions of Associations:Intact  Orientation:Full (Time, Place and Person)  Thought Content:Logical  History of Schizophrenia/Schizoaffective disorder:Yes  Duration of Psychotic  Symptoms:Greater than six months  Hallucinations:Hallucinations: Denies AH. Denying VH today.   Ideas of Reference: Denies Paranoia  Suicidal Thoughts:No data recorded Denies  Homicidal Thoughts:No data recorded denies  Sensorium  Memory: Immediate Fair; Recent Fair; Remote Fair  Judgment: Fair  Insight: Fair   Chartered certified accountant: Fair  Attention Span: Fair  Recall: [employer] of Knowledge: Fair  Language: Fair   Psychomotor Activity  Psychomotor Activity: No data recorded nml  Assets  Assets: Communication Skills; Desire for Improvement; Housing; Physical Health; Resilience   Sleep  Sleep: No data recorded okay   Physical Exam: Physical Exam Vitals reviewed.  Constitutional:      General: He is not in acute distress.    Appearance: He is not toxic-appearing.  Pulmonary:     Effort: Pulmonary effort is normal. No respiratory distress.  Neurological:     Mental Status: He is alert.     Motor: No weakness.     Gait: Gait  normal.  Psychiatric:        Behavior: Behavior normal.        Judgment: Judgment normal.    Review of Systems  Constitutional:  Negative for chills and fever.  Cardiovascular:  Negative for chest pain and palpitations.  Neurological:  Negative for dizziness, tingling, tremors and headaches.  Psychiatric/Behavioral:  Positive for hallucinations. Negative for depression, memory loss, substance abuse and suicidal ideas. The patient is nervous/anxious. The patient does not have insomnia.   All other systems reviewed and are negative.  Blood pressure (!) 152/100, pulse 81, temperature (!) 97.4 F (36.3 C), temperature source Oral, resp. rate 20, height 5\' 7"  (1.702 m), weight 106.1 kg, SpO2 100 %. Body mass index is 36.65 kg/m.   Treatment Plan Summary: Daily contact with patient to assess and evaluate symptoms and progress in treatment and Medication management  Physician Treatment Plan for Primary  Diagnosis:  Assessment: Schizophrenia (HCC) Polysubstance use   Plan: Medications: Continue Abilify 15 mg once daily for schizophrenia  Continue trazodone 50 mg p.o. q. nightly as needed for insomnia. Continue hydroxyzine 25 mg p.o. 3 times daily as needed for anxiety   Medications for medical conditions: -INCREASE Amlodipine Norvasc tablet to 5 mg p.o. daily for high blood pressure -START clonidine 0.1 mg PRN for high BP   Apixaban Eliquis tablet 5 mg p.o. every 12 hours as needed for PE Atorvastatin Lipitor tablet 40 mg p.o. daily for hyperlipidemia Nicotine NicoDerm CQ dose in milligrams per 24 hours 14 mg patch transdermal for smoking cessation Insulin glargine Semglee injection 20 units subcu daily for diabetes Potassium chloride 40 mill equivalent x 1 dose today 07/13/2022   -Order COVID tests for concerning symptoms    Other PRN Medications -Acetaminophen 650 mg every 6 as needed/mild pain -Maalox 30 mL oral every 4 as needed/digestion -Magnesium hydroxide 30 mL daily as needed/mild constipation   -- The risks/benefits/side-effects/alternatives to this medication were discussed in detail with the patient and time was given for questions. The patient consents to medication trial.              -- Encouraged patient to participate  in unit milieu and in scheduled group therapies      Safety and Monitoring: Voluntary admission to inpatient psychiatric unit for safety, stabilization and treatment Daily contact with patient to assess and evaluate symptoms and progress in treatment Patient's case to be discussed in multi-disciplinary team meeting Observation Level : q15 minute checks Vital signs: q12 hours Precautions: suicide, but pt currently verbally contracts for safety on unit    Discharge Planning: Social work and case management to assist with discharge planning and identification of hospital follow-up needs prior to discharge Estimated LOS: Discharge plan pending,  the patient is accepted to [facility] for residential substance use treatment Discharge Concerns: Need to establish a safety plan; Medication compliance and effectiveness Discharge Goals: Return home with outpatient referrals for mental health follow-up including medication management/psychotherapy.    Cristy Hilts, MD 07/18/2022, 8:29 AM  Total Time Spent in Direct Patient Care:  I personally spent 35 minutes on the unit in direct patient care. The direct patient care time included face-to-face time with the patient, reviewing the patient's chart, communicating with other professionals, and coordinating care. Greater than 50% of this time was spent in counseling or coordinating care with the patient regarding goals of hospitalization, psycho-education, and discharge planning needs.   Phineas Inches, MD Psychiatrist

## 2022-07-18 NOTE — Plan of Care (Signed)

## 2022-07-18 NOTE — Group Note (Unsigned)
Date:  07/18/2022 Time:  5:13 PM  Group Topic/Focus:  Dimensions of Wellness:   The focus of this group is to introduce the topic of wellness and discuss the role each dimension of wellness plays in total health. Emotional Education:   The focus of this group is to discuss what feelings/emotions are, and how they are experienced.     Participation Level:  {BHH PARTICIPATION MWUXL:24401}  Participation Quality:  {BHH PARTICIPATION QUALITY:22265}  Affect:  {BHH AFFECT:22266}  Cognitive:  {BHH COGNITIVE:22267}  Insight: {BHH Insight2:20797}  Engagement in Group:  {BHH ENGAGEMENT IN UUVOZ:36644}  Modes of Intervention:  {BHH MODES OF INTERVENTION:22269}  Additional Comments:  ***  Amiliana Foutz M Alvilda Mckenna 07/18/2022, 5:13 PM

## 2022-07-19 ENCOUNTER — Encounter (HOSPITAL_COMMUNITY): Payer: Self-pay

## 2022-07-19 LAB — GLUCOSE, CAPILLARY
Glucose-Capillary: 154 mg/dL — ABNORMAL HIGH (ref 70–99)
Glucose-Capillary: 198 mg/dL — ABNORMAL HIGH (ref 70–99)
Glucose-Capillary: 330 mg/dL — ABNORMAL HIGH (ref 70–99)
Glucose-Capillary: 238 mg/dL — ABNORMAL HIGH (ref 70–99)

## 2022-07-19 MED ORDER — INSULIN ASPART 100 UNIT/ML IJ SOLN
8.0000 [IU] | Freq: Three times a day (TID) | INTRAMUSCULAR | Status: DC
  Administered 2022-07-19 – 2022-07-20 (×2): 8 [IU] via SUBCUTANEOUS

## 2022-07-19 MED ORDER — AMLODIPINE BESYLATE 10 MG PO TABS
10.0000 mg | ORAL_TABLET | Freq: Every day | ORAL | Status: DC
  Administered 2022-07-20: 10 mg via ORAL
  Filled 2022-07-19 (×3): qty 1

## 2022-07-19 NOTE — BHH Group Notes (Signed)
Spiritual care group on grief and loss facilitated by Chaplain Katy Loranda Mastel, [facility]  Group Goal: Support / Education around grief and loss  Members engage in facilitated group support and psycho-social education.  Group Description:  Following introductions and group rules, group members engaged in facilitated group dialogue and support around topic of loss, with particular support around experiences of loss in their lives. Group Identified types of loss (relationships / self / things) and identified patterns, circumstances, and changes that precipitate losses. Reflected on thoughts / feelings around loss, normalized grief responses, and recognized variety in grief experience. Group encouraged individual reflection on safe space and on the coping skills that they are already utilizing.  Group drew on Adlerian / Rogerian and narrative framework  Patient Progress: Did not attend.  

## 2022-07-19 NOTE — BHH Counselor (Addendum)
B[facility]/BMU LCSW Progress Note   07/19/2022    2:00 PM  Henry Munoz      Type of Note: [facility] Follow Up   CSW reached out to residential to see about patient referral and was informed that they never received referral . CSW provided the time stamp and day along with approval of it going through. The lady then asked for it to be resent. CSW resent referral.    CSW spoke with patient @ 2:50 PM and informed him that he would need to call [facility] to complete his phone assessment and patient exact words, " I need to call them right now ?", with a puzzle look and CSW informed him that it would be best to do it now, to know if he is accepted or not and patient put the covers back over his head.     Signed:   Jacob Moores, MSW, LCSWA 07/19/2022 2:00 PM

## 2022-07-19 NOTE — Progress Notes (Signed)
   07/19/22 0600  15 Minute Checks  Location Hallway  Visual Appearance Calm  Behavior Composed  Sleep (Behavioral Health Patients Only)  Calculate sleep? (Click Yes once per 24 hr at 0600 safety check) Yes  Documented sleep last 24 hours 8

## 2022-07-19 NOTE — Progress Notes (Signed)
   07/19/22 0817  Psych Admission Type (Psych Patients Only)  Admission Status Voluntary  Psychosocial Assessment  Patient Complaints Anxiety;Depression  Eye Contact Fair  Facial Expression Flat  Affect Appropriate to circumstance  Speech Logical/coherent  Interaction Assertive  Motor Activity Slow  Appearance/Hygiene Unremarkable  Behavior Characteristics Cooperative;Irritable;Guarded  Thought Process  Coherency WDL  Content WDL  Delusions None reported or observed  Perception WDL  Hallucination None reported or observed  Judgment Impaired  Confusion None  Danger to Self  Current suicidal ideation? Denies  Agreement Not to Harm Self Yes  Description of Agreement Verbal contracts for safety  Danger to Others  Danger to Others None reported or observed

## 2022-07-19 NOTE — Progress Notes (Signed)
Patient refused his blood sugar check, but later walked to the tech to check it for him. His blood glucose level was 330, and when he was invited to get his insulin, he refused again, stating "I'm fine, I know my body, I don't need any insulin." Patient came to the medication  window after the staff persuaded him to get his insulin. Patient states to this writer, "I'm only taking the insulin because I don't want you to feel bad." MD has been made aware of patient refusal. Staff will continue to provide support to patient.

## 2022-07-19 NOTE — Progress Notes (Signed)
    07/18/22 2120  Psych Admission Type (Psych Patients Only)  Admission Status Voluntary  Psychosocial Assessment  Patient Complaints Depression  Eye Contact Fair  Facial Expression Flat  Affect Appropriate to circumstance  Speech Logical/coherent  Interaction Assertive  Motor Activity Slow  Appearance/Hygiene Unremarkable  Behavior Characteristics Cooperative;Guarded  Mood Depressed  Thought Process  Coherency WDL  Content WDL  Delusions None reported or observed  Perception WDL  Hallucination None reported or observed  Judgment Impaired  Confusion None  Danger to Self  Current suicidal ideation? Denies  Agreement Not to Harm Self Yes  Description of Agreement Verbal contract for safety  Danger to Others  Danger to Others None reported or observed   Pt was given scheduled medications. Given PRN Trazodone per MAR. Pt was offered support and encouragement. Q 15 minute checks were done for safety. Pt attended group. Pt has no complaints.Pt receptive to treatment and safety maintained on unit.

## 2022-07-19 NOTE — Inpatient Diabetes Management (Signed)
Inpatient Diabetes Program Recommendations  AACE/ADA: New Consensus Statement on Inpatient Glycemic Control (2015)  Target Ranges:  Prepandial:   less than 140 mg/dL      Peak postprandial:   less than 180 mg/dL (1-2 hours)      Critically ill patients:  140 - 180 mg/dL   Lab Results  Component Value Date   GLUCAP 154 (H) 07/19/2022   HGBA1C 14.0 (H) 06/29/2022    Review of Glycemic Control  Diabetes history: DM2 Outpatient Diabetes medications: Lantus 20 QD, Novolog 4 TID and 0-6 TID Current orders for Inpatient glycemic control: Semglee 22 units QD, Novolog 0-6 TID with meals and 0-5 HS + 6 units TID  HgbA1C - 14% Post-prandials remain elevated. Increase mc insulin.  Inpatient Diabetes Program Recommendations:    Increase Novolog to 8 units TID with meals  Continue to follow glucose trends.  Thank you. Ailene Ards, RD, LDN, CDCES Inpatient Diabetes Coordinator [phone]

## 2022-07-19 NOTE — Plan of Care (Signed)

## 2022-07-19 NOTE — Group Note (Signed)
Recreation Therapy Group Note   Group Topic:Healthy Decision Making  Group Date: 07/19/2022 Start Time: 0935 End Time: 1015 Facilitators: Kenedie Dirocco-McCall, LRT,CTRS Location: [address] Dayroom   Goal Area(s) Addresses:  Patient will effectively work with peer towards shared goal.  Patient will identify factors that guided their decision making.  Patient will pro-socially communicate ideas during group session.   Group Descripiton:  Patients were given a scenario that they were going to be stranded on a deserted [place] for several months before being rescued. Writer tasked them with making a list of 15 things they would choose to bring with them for "survival". The list of items was prioritized most important to least. Each patient would come up with their own list, then work together to create a new list of 15 items while in a group of 3-5 peers. LRT discussed each person's list and how it differed from others. The debrief included discussion of priorities, good decisions versus bad decisions, and how it is important to think before acting so we can make the best decision possible. LRT tied the concept of effective communication among group members to patient's support systems outside of the hospital and its benefit post discharge.   Affect/Mood: N/A   Participation Level: Did not attend    Clinical Observations/Individualized Feedback:     Plan: Continue to engage patient in RT group sessions 2-3x/week.   Hetal Proano-McCall, LRT,CTRS 07/19/2022 12:57 PM

## 2022-07-19 NOTE — Group Note (Signed)
Date:  07/19/2022 Time:  9:52 PM  Group Topic/Focus:  Recovery Goals:   The focus of this group is to identify appropriate goals for recovery and establish a plan to achieve them.    Participation Level:  Minimal  Participation Quality:  Attentive  Affect:  Appropriate  Cognitive:  Appropriate  Insight: Good  Engagement in Group:   n/a  Modes of Intervention:   none  Additional Comments:  n/a  Donzel Romack E Azarian Starace 07/19/2022, 9:52 PM

## 2022-07-19 NOTE — BH IP Treatment Plan (Signed)
Interdisciplinary Treatment and Diagnostic Plan Update  07/19/2022 Time of Session: 8:30am, update Henry Munoz MRN: [id number]  Principal Diagnosis: Schizophrenia [facility])  Secondary Diagnoses: Principal Problem:   Schizophrenia (HCC)   Current Medications:  Current Facility-Administered Medications  Medication Dose Route Frequency Provider Last Rate Last Admin   acetaminophen (TYLENOL) tablet 650 mg  650 mg Oral Q6H PRN Jearld Lesch, NP   650 mg at 07/17/22 2044   alum & mag hydroxide-simeth (MAALOX/MYLANTA) 200-200-20 MG/5ML suspension 30 mL  30 mL Oral Q4H PRN Jearld Lesch, NP       [START ON 07/20/2022] amLODipine (NORVASC) tablet 10 mg  10 mg Oral Daily Massengill, Harrold Donath, MD       apixaban (ELIQUIS) tablet 5 mg  5 mg Oral Q12H Green, Terri L, RPH   5 mg at 07/19/22 0818   ARIPiprazole (ABILIFY) tablet 15 mg  15 mg Oral Daily Massengill, Harrold Donath, MD   15 mg at 07/19/22 0818   atorvastatin (LIPITOR) tablet 40 mg  40 mg Oral Daily Lerry Liner M, NP   40 mg at 07/19/22 0818   cloNIDine (CATAPRES) tablet 0.1 mg  0.1 mg Oral Q4H PRN Phineas Inches, MD   0.1 mg at 07/19/22 4782   diphenhydrAMINE (BENADRYL) capsule 50 mg  50 mg Oral TID PRN Jearld Lesch, NP       Or   diphenhydrAMINE (BENADRYL) injection 50 mg  50 mg Intramuscular TID PRN Jearld Lesch, NP       haloperidol (HALDOL) tablet 5 mg  5 mg Oral TID PRN Jearld Lesch, NP       Or   haloperidol lactate (HALDOL) injection 5 mg  5 mg Intramuscular TID PRN Jearld Lesch, NP       hydrOXYzine (ATARAX) tablet 25 mg  25 mg Oral TID PRN Jearld Lesch, NP   25 mg at 07/16/22 1829   insulin aspart (novoLOG) injection 0-5 Units  0-5 Units Subcutaneous QHS Massengill, Harrold Donath, MD   3 Units at 07/18/22 2123   insulin aspart (novoLOG) injection 0-6 Units  0-6 Units Subcutaneous TID WC Massengill, Harrold Donath, MD   1 Units at 07/19/22 9562   insulin aspart (novoLOG) injection 8 Units  8 Units Subcutaneous TID WC  Massengill, Harrold Donath, MD       insulin glargine-yfgn (SEMGLEE) injection 22 Units  22 Units Subcutaneous Daily Massengill, Harrold Donath, MD   22 Units at 07/19/22 0817   LORazepam (ATIVAN) tablet 2 mg  2 mg Oral TID PRN Jearld Lesch, NP       Or   LORazepam (ATIVAN) injection 2 mg  2 mg Intramuscular TID PRN Jearld Lesch, NP       magnesium hydroxide (MILK OF MAGNESIA) suspension 30 mL  30 mL Oral Daily PRN Dixon, Rashaun M, NP       nicotine (NICODERM CQ - dosed in mg/24 hours) patch 14 mg  14 mg Transdermal Daily Massengill, Harrold Donath, MD   14 mg at 07/18/22 1308   traZODone (DESYREL) tablet 50 mg  50 mg Oral QHS PRN Jearld Lesch, NP   50 mg at 07/18/22 2121   PTA Medications: Medications Prior to Admission  Medication Sig Dispense Refill Last Dose   amLODipine (NORVASC) 2.5 MG tablet Take 1 tablet (2.5 mg total) by mouth daily. 30 tablet 3    apixaban (ELIQUIS) 5 MG TABS tablet Take 2 tablets (10 mg total) by mouth 2 (two) times daily. And then from 07/10/22  take 5 mg two times daily 60 tablet 3    atorvastatin (LIPITOR) 40 MG tablet Take 1 tablet (40 mg total) by mouth daily. 30 tablet 3    Blood Glucose Monitoring Suppl DEVI 1 each by Does not apply route 3 (three) times daily. May dispense any manufacturer covered by patient's insurance. (Patient not taking: Reported on 07/11/2022) 1 each 0    clotrimazole (LOTRIMIN) 1 % cream Apply 1 Application topically 2 (two) times daily. (Patient not taking: Reported on 07/11/2022) 30 g 0    Glucose Blood (BLOOD GLUCOSE TEST STRIPS) STRP 1 each by Does not apply route 3 (three) times daily. Use as directed to check blood sugar. May dispense any manufacturer covered by patient's insurance and fits patient's device. (Patient not taking: Reported on 07/11/2022) 100 strip 0    insulin aspart (NOVOLOG) 100 UNIT/ML FlexPen If eating and Blood Glucose (BG) 80 or higher inject 4 units for meal coverage and add correction dose per scale. If not eating, correction dose  only. BG <150= 0 unit; BG 150-200= 1 unit; BG 201-250= 2 unit; BG 251-300= 3 unit; BG 301-350= 4 unit; BG 351-400= 5 unit; BG >400= 6 unit and Call Primary care. 15 mL 0    insulin glargine (LANTUS) 100 UNIT/ML Solostar Pen Inject 20 Units into the skin daily. May substitute as needed per insurance. 15 mL 0    Insulin Pen Needle (PEN NEEDLES) 31G X 5 MM MISC 1 each by Does not apply route 3 (three) times daily. May dispense any manufacturer covered by patient's insurance. (Patient not taking: Reported on 07/11/2022) 100 each 0    Lancet Device MISC 1 each by Does not apply route 3 (three) times daily. May dispense any manufacturer covered by patient's insurance. (Patient not taking: Reported on 07/11/2022) 1 each 0    Lancets MISC 1 each by Does not apply route 3 (three) times daily. Use as directed to check blood sugar. May dispense any manufacturer covered by patient's insurance and fits patient's device. (Patient not taking: Reported on 07/11/2022) 100 each 0    polyethylene glycol (MIRALAX / GLYCOLAX) 17 g packet Take 17 g by mouth daily as needed for moderate constipation. (Patient not taking: Reported on 07/11/2022) 30 each 0     Patient Stressors: Health problems   Substance abuse    Patient Strengths: Forensic psychologist fund of knowledge   Treatment Modalities: Medication Management, Group therapy, Case management,  1 to 1 session with clinician, Psychoeducation, Recreational therapy.   Physician Treatment Plan for Primary Diagnosis: Schizophrenia (HCC) Long Term Goal(s): Improvement in symptoms so as ready for discharge   Short Term Goals: Ability to identify changes in lifestyle to reduce recurrence of condition will improve Ability to verbalize feelings will improve Ability to disclose and discuss suicidal ideas Ability to demonstrate self-control will improve Ability to identify and develop effective coping behaviors will improve Ability to maintain clinical measurements  within normal limits will improve Compliance with prescribed medications will improve Ability to identify triggers associated with substance abuse/mental health issues will improve  Medication Management: Evaluate patient's response, side effects, and tolerance of medication regimen.  Therapeutic Interventions: 1 to 1 sessions, Unit Group sessions and Medication administration.  Evaluation of Outcomes: Progressing  Physician Treatment Plan for Secondary Diagnosis: Principal Problem:   Schizophrenia (HCC)  Long Term Goal(s): Improvement in symptoms so as ready for discharge   Short Term Goals: Ability to identify changes in lifestyle to reduce recurrence of condition  will improve Ability to verbalize feelings will improve Ability to disclose and discuss suicidal ideas Ability to demonstrate self-control will improve Ability to identify and develop effective coping behaviors will improve Ability to maintain clinical measurements within normal limits will improve Compliance with prescribed medications will improve Ability to identify triggers associated with substance abuse/mental health issues will improve     Medication Management: Evaluate patient's response, side effects, and tolerance of medication regimen.  Therapeutic Interventions: 1 to 1 sessions, Unit Group sessions and Medication administration.  Evaluation of Outcomes: Progressing   RN Treatment Plan for Primary Diagnosis: Schizophrenia (HCC) Long Term Goal(s): Knowledge of disease and therapeutic regimen to maintain health will improve  Short Term Goals: Ability to remain free from injury will improve, Ability to verbalize frustration and anger appropriately will improve, Ability to demonstrate self-control, Ability to participate in decision making will improve, Ability to verbalize feelings will improve, Ability to disclose and discuss suicidal ideas, Ability to identify and develop effective coping behaviors will  improve, and Compliance with prescribed medications will improve  Medication Management: RN will administer medications as ordered by provider, will assess and evaluate patient's response and provide education to patient for prescribed medication. RN will report any adverse and/or side effects to prescribing provider.  Therapeutic Interventions: 1 on 1 counseling sessions, Psychoeducation, Medication administration, Evaluate responses to treatment, Monitor vital signs and CBGs as ordered, Perform/monitor CIWA, COWS, AIMS and Fall Risk screenings as ordered, Perform wound care treatments as ordered.  Evaluation of Outcomes: Progressing   LCSW Treatment Plan for Primary Diagnosis: Schizophrenia (HCC) Long Term Goal(s): Safe transition to appropriate next level of care at discharge, Engage patient in therapeutic group addressing interpersonal concerns.  Short Term Goals: Engage patient in aftercare planning with referrals and resources, Increase social support, Increase ability to appropriately verbalize feelings, Increase emotional regulation, Facilitate acceptance of mental health diagnosis and concerns, Facilitate patient progression through stages of change regarding substance use diagnoses and concerns, Identify triggers associated with mental health/substance abuse issues, and Increase skills for wellness and recovery  Therapeutic Interventions: Assess for all discharge needs, 1 to 1 time with Social worker, Explore available resources and support systems, Assess for adequacy in community support network, Educate family and significant other(s) on suicide prevention, Complete Psychosocial Assessment, Interpersonal group therapy.  Evaluation of Outcomes: Progressing  Progress in Treatment: Attending groups: Yes. Participating in groups: Yes. Taking medication as prescribed: Yes. Toleration medication: Yes. Family/Significant other contact made: No, will contact:  Brother : Bishop\ Patient  understands diagnosis: Yes. Discussing patient identified problems/goals with staff: Yes. Medical problems stabilized or resolved: Yes. Denies suicidal/homicidal ideation: Yes. Issues/concerns per patient self-inventory: No.     New problem(s) identified: No, Describe:  None reported    New Short Term/Long Term Goal(s):detox, medication management for mood stabilization; elimination of SI thoughts; development of comprehensive mental wellness/sobriety plan    Patient Goals:  " Get better and stop having visual hallucinating "    Discharge Plan or Barriers: Patient recently admitted. CSW will continue to follow and assess for appropriate referrals and possible discharge planning.     Reason for Continuation of Hospitalization: Anxiety Depression Hallucinations Medication stabilization Suicidal ideation Withdrawal symptoms   Estimated Length of Stay: 1-3 Days   Last 3 [place] Suicide Severity Risk Score: Flowsheet Row Ad[facility] (Current) [facility] 07[facility]11/2022 [facility] ED to Hosp-Admission (Discharged) from 07/02/2022 in [facility]  C-SSRS RISK CATEGORY High Risk High Risk No Risk       Last PHQ 2/9 Scores:     No data to display          Scribe for Treatment Team: Beatris Si, LCSW 07/19/2022 4:47 PM

## 2022-07-19 NOTE — Progress Notes (Signed)
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 Patient presents voluntarily to [facility] from [facility] after stabilization for suicidal ideation x 2 weeks with plans to harm himself or steal from people in the context of crack cocaine smoking.   On assessment today, the patient reports auditory hallucinations have resolved completely.  Denies any CAH.  Denies having any visual hallucinations.   Reports tolerating increased dose of about Abilify well without side effects.  Reports that mood is better.  Reports anxiety is less.  Reports that sleep is adequate.  Reports appetite is okay.  Reports concentration is okay.  Denies any SI or HI.  Denies any paranoia.  Per SW, there was no response from [facility] for  residential substance use treatment as of this morning - awaiting acceptance (or denial) from this facility.  Patient agreeable to remain in the hospital until we hear back about this residential substance use treatment option.     Principal Problem: Schizophrenia (HCC) Diagnosis: Principal Problem:   Schizophrenia (HCC)  Total Time spent with patient: 20 minutes  Past Psychiatric History:  Previous Psych Diagnoses: Schizophrenia, alcohol use dependence, auditory hallucination, cocaine abuse, MDD, suicide ideation. Prior inpatient treatment: Yes, seen at [facility] Current/prior outpatient treatment: Denies Prior rehab hx: Denies Psychotherapy hx: Yes History of suicide: Yes x 2 History of homicide or aggression: Denies Psychiatric medication history: Yes, being treated with Seroquel while in prison. Psychiatric  medication compliance history: Noncompliance Neuromodulation history: Denies Current Psychiatrist: Denies Current therapist: Denies   Past Medical History:  Past Medical History:  Diagnosis Date   Alcohol abuse    Cocaine abuse (HCC)    Diabetes mellitus without complication (HCC)    Diastolic dysfunction    a. 06/2022 Echo (in setting of acute PE):  EF 50-55%, no rwma, mod-sev LVH, grI DD, mod reduced RV fxn, triv MR w/ MVP of post leaflet, ? bicuspid AoV w/ triv AI and Ao sclerosis.   Hypertension    Pulmonary embolism (HCC)    a. 06/2022 CTA: Large volume PE affecting all lobar braches w/ R heart strain-->s/p mech thrombectomy.   Schizo-affective schizophrenia (HCC)    Stroke [facility])    TIA (transient ischemic attack)    Tobacco abuse     Past Surgical History:  Procedure Laterality Date   PULMONARY THROMBECTOMY Bilateral 06/30/2022   Procedure: PULMONARY THROMBECTOMY;  Surgeon: Renford Dills, MD;  Location: [facility] INVASIVE CV LAB;  Service: Cardiovascular;  Laterality: Bilateral;   Family History:  Family History  Problem Relation Age of Onset   High blood pressure Father    Diabetes Father    Renal Disease Father    Family Psychiatric  History: See H&P  Social History:  Social History   Substance and Sexual Activity  Alcohol Use Yes   Alcohol/week: 2.0 standard drinks of alcohol   Types: 2 Cans of beer per week   Comment: per week. 07/11/22 states does not drink     Social History   Substance and Sexual Activity  Drug Use Yes   Types: Cocaine   Comment: Uses weekly. also crack    Social History   Socioeconomic History   Marital status: Single    Spouse  name: Not on file   Number of children: Not on file   Years of education: Not on file   Highest education level: Not on file  Occupational History   Not on file  Tobacco Use   Smoking status: Every Day    Packs/day: 0.50    Years: 15.00    Additional pack years: 0.00    Total pack years: 7.50     Types: Cigarettes   Smokeless tobacco: Never  Substance and Sexual Activity   Alcohol use: Yes    Alcohol/week: 2.0 standard drinks of alcohol    Types: 2 Cans of beer per week    Comment: per week. 07/11/22 states does not drink   Drug use: Yes    Types: Cocaine    Comment: Uses weekly. also crack   Sexual activity: Not on file  Other Topics Concern   Not on file  Social History Narrative   Lives locally.  Does not routinely exercise.   Social Determinants of Health   Financial Resource Strain: Low Risk  (10/13/2017)   Overall Financial Resource Strain (CARDIA)    Difficulty of Paying Living Expenses: Not hard at all  Food Insecurity: No Food Insecurity (07/12/2022)   Hunger Vital Sign    Worried About Running Out of Food in the Last Year: Never true    Ran Out of Food in the Last Year: Never true  Transportation Needs: No Transportation Needs (07/12/2022)   PRAPARE - Administrator, Civil Service (Medical): No    Lack of Transportation (Non-Medical): No  Recent Concern: Transportation Needs - Unmet Transportation Needs (07/04/2022)   PRAPARE - Transportation    Lack of Transportation (Medical): Yes    Lack of Transportation (Non-Medical): Yes  Physical Activity: Inactive (10/13/2017)   Exercise Vital Sign    Days of Exercise per Week: 0 days    Minutes of Exercise per Session: 0 min  Stress: No Stress Concern Present (10/13/2017)   [employer] of Occupational Health - Occupational Stress Questionnaire    Feeling of Stress : Not at all  Social Connections: Somewhat Isolated (10/13/2017)   Social Connection and Isolation Panel [NHANES]    Frequency of Communication with Friends and Family: Three times a week    Frequency of Social Gatherings with Friends and Family: Never    Attends Religious Services: 1 to 4 times per year    Active Member of [employer] or Organizations: No    Attends Banker Meetings: Never    Marital Status: Separated   Additional Social  History:                           Current Medications: Current Facility-Administered Medications  Medication Dose Route Frequency Provider Last Rate Last Admin   acetaminophen (TYLENOL) tablet 650 mg  650 mg Oral Q6H PRN Jearld Lesch, NP   650 mg at 07/17/22 2044   alum & mag hydroxide-simeth (MAALOX/MYLANTA) 200-200-20 MG/5ML suspension 30 mL  30 mL Oral Q4H PRN Jearld Lesch, NP       [START ON 07/20/2022] amLODipine (NORVASC) tablet 10 mg  10 mg Oral Daily Taveon Enyeart, MD       apixaban (ELIQUIS) tablet 5 mg  5 mg Oral Q12H Green, Terri L, RPH   5 mg at 07/19/22 0818   ARIPiprazole (ABILIFY) tablet 15 mg  15 mg Oral Daily Stormi Vandevelde, Harrold Donath, MD   15 mg at 07/19/22 [phone]  atorvastatin (LIPITOR) tablet 40 mg  40 mg Oral Daily Dixon, Rashaun M, NP   40 mg at 07/19/22 0818   cloNIDine (CATAPRES) tablet 0.1 mg  0.1 mg Oral Q4H PRN Phineas Inches, MD   0.1 mg at 07/19/22 1610   diphenhydrAMINE (BENADRYL) capsule 50 mg  50 mg Oral TID PRN Jearld Lesch, NP       Or   diphenhydrAMINE (BENADRYL) injection 50 mg  50 mg Intramuscular TID PRN Jearld Lesch, NP       haloperidol (HALDOL) tablet 5 mg  5 mg Oral TID PRN Jearld Lesch, NP       Or   haloperidol lactate (HALDOL) injection 5 mg  5 mg Intramuscular TID PRN Jearld Lesch, NP       hydrOXYzine (ATARAX) tablet 25 mg  25 mg Oral TID PRN Jearld Lesch, NP   25 mg at 07/16/22 1829   insulin aspart (novoLOG) injection 0-5 Units  0-5 Units Subcutaneous QHS Phineas Inches, MD   3 Units at 07/18/22 2123   insulin aspart (novoLOG) injection 0-6 Units  0-6 Units Subcutaneous TID WC Loma [place], Harrold Donath, MD   1 Units at 07/19/22 9604   insulin aspart (novoLOG) injection 8 Units  8 Units Subcutaneous TID WC Saleena Tamas, Harrold Donath, MD       insulin glargine-yfgn (SEMGLEE) injection 22 Units  22 Units Subcutaneous Daily Contessa Preuss, Harrold Donath, MD   22 Units at 07/19/22 0817   LORazepam (ATIVAN) tablet 2 mg  2 mg Oral  TID PRN Jearld Lesch, NP       Or   LORazepam (ATIVAN) injection 2 mg  2 mg Intramuscular TID PRN Jearld Lesch, NP       magnesium hydroxide (MILK OF MAGNESIA) suspension 30 mL  30 mL Oral Daily PRN Dixon, Rashaun M, NP       nicotine (NICODERM CQ - dosed in mg/24 hours) patch 14 mg  14 mg Transdermal Daily Bayard More, Harrold Donath, MD   14 mg at 07/18/22 5409   traZODone (DESYREL) tablet 50 mg  50 mg Oral QHS PRN Jearld Lesch, NP   50 mg at 07/18/22 2121    Lab Results:  Results for orders placed or performed during the hospital encounter of 07/12/22 (from the past 48 hour(s))  Glucose, capillary     Status: Abnormal   Collection Time: 07/17/22  4:56 PM  Result Value Ref Range   Glucose-Capillary 297 (H) 70 - 99 mg/dL    Comment: Glucose reference range applies only to samples taken after fasting for at least 8 hours.  Glucose, capillary     Status: Abnormal   Collection Time: 07/17/22  8:32 PM  Result Value Ref Range   Glucose-Capillary 268 (H) 70 - 99 mg/dL    Comment: Glucose reference range applies only to samples taken after fasting for at least 8 hours.  Glucose, capillary     Status: Abnormal   Collection Time: 07/18/22  5:42 AM  Result Value Ref Range   Glucose-Capillary 159 (H) 70 - 99 mg/dL    Comment: Glucose reference range applies only to samples taken after fasting for at least 8 hours.  SARS Coronavirus 2 by RT PCR (hospital order, performed in [facility] hospital lab) *cepheid single result test* Anterior Nasal Swab     Status: None   Collection Time: 07/18/22 10:21 AM   Specimen: Anterior Nasal Swab  Result Value Ref Range   SARS Coronavirus 2 by RT PCR NEGATIVE  NEGATIVE    Comment: (NOTE) SARS-CoV-2 target nucleic acids are NOT DETECTED.  The SARS-CoV-2 RNA is generally detectable in upper and lower respiratory specimens during the acute phase of infection. The lowest concentration of SARS-CoV-2 viral copies this assay can detect is 250 copies / mL. A  negative result does not preclude SARS-CoV-2 infection and should not be used as the sole basis for treatment or other patient management decisions.  A negative result may occur with improper specimen collection / handling, submission of specimen other than nasopharyngeal swab, presence of viral mutation(s) within the areas targeted by this assay, and inadequate number of viral copies (<250 copies / mL). A negative result must be combined with clinical observations, patient history, and epidemiological information.  Fact Sheet for Patients:   RoadLapTop.co.za  Fact Sheet for Healthcare Providers: http://kim-miller.com/  This test is not yet approved or  cleared by the [place] FDA and has been authorized for detection and/or diagnosis of SARS-CoV-2 by FDA under an Emergency Use Authorization (EUA).  This EUA will remain in effect (meaning this test can be used) for the duration of the COVID-19 declaration under Section 564(b)(1) of the Act, 21 U.S.C. section 360bbb-3(b)(1), unless the authorization is terminated or revoked sooner.  Performed at [facility], [address], [place], [place] [address]   Glucose, capillary     Status: Abnormal   Collection Time: 07/18/22 11:59 AM  Result Value Ref Range   Glucose-Capillary 192 (H) 70 - 99 mg/dL    Comment: Glucose reference range applies only to samples taken after fasting for at least 8 hours.   Comment 1 Notify RN   Glucose, capillary     Status: Abnormal   Collection Time: 07/18/22  4:49 PM  Result Value Ref Range   Glucose-Capillary 346 (H) 70 - 99 mg/dL    Comment: Glucose reference range applies only to samples taken after fasting for at least 8 hours.  Glucose, capillary     Status: Abnormal   Collection Time: 07/18/22  8:38 PM  Result Value Ref Range   Glucose-Capillary 268 (H) 70 - 99 mg/dL    Comment: Glucose reference range applies only to samples  taken after fasting for at least 8 hours.   Comment 1 Notify RN    Comment 2 Document in Chart   Glucose, capillary     Status: Abnormal   Collection Time: 07/19/22  5:45 AM  Result Value Ref Range   Glucose-Capillary 154 (H) 70 - 99 mg/dL    Comment: Glucose reference range applies only to samples taken after fasting for at least 8 hours.  Glucose, capillary     Status: Abnormal   Collection Time: 07/19/22 12:03 PM  Result Value Ref Range   Glucose-Capillary 198 (H) 70 - 99 mg/dL    Comment: Glucose reference range applies only to samples taken after fasting for at least 8 hours.    Blood Alcohol level:  Lab Results  Component Value Date   ETH <10 07/11/2022   ETH <10 06/29/2022    Metabolic Disorder Labs: Lab Results  Component Value Date   HGBA1C 14.0 (H) 06/29/2022   MPG 355.1 06/29/2022   MPG 315 08/10/2016   No results found for: "PROLACTIN" Lab Results  Component Value Date   CHOL 251 (H) 06/29/2022   TRIG 96 06/29/2022   HDL 26 (L) 06/29/2022   CHOLHDL 9.7 06/29/2022   VLDL 19 06/29/2022   LDLCALC 206 (H) 06/29/2022   LDLCALC 152 (  H) 08/10/2016    Physical Findings: AIMS:  , ,  ,  ,    CIWA:    COWS:     Aims score zero on my exam. No eps on my exam.  Musculoskeletal: Strength & Muscle Tone: within normal limits Gait & Station: normal Patient leans: N/A  Psychiatric Specialty Exam:  Presentation  General Appearance:  Casual; Fairly Groomed  Eye Contact: Fair  Speech: Normal Rate  Speech Volume: Normal  Handedness: Right   Mood and Affect  Mood: Anxious  Affect: Congruent   Thought Process  Thought Processes: Linear  Descriptions of Associations:Intact  Orientation:Full (Time, Place and Person)  Thought Content:Logical  History of Schizophrenia/Schizoaffective disorder:Yes  Duration of Psychotic Symptoms:Greater than six months  Hallucinations:Hallucinations: Denies AH. Denying VH today.   Ideas of Reference:  Denies Paranoia  Suicidal Thoughts:No data recorded Denies  Homicidal Thoughts:No data recorded denies  Sensorium  Memory: Immediate Fair; Recent Fair; Remote Fair  Judgment: Fair  Insight: Fair   Chartered certified accountant: Fair  Attention Span: Fair  Recall: [employer] of Knowledge: Fair  Language: Fair   Psychomotor Activity  Psychomotor Activity: No data recorded nml  Assets  Assets: Communication Skills; Desire for Improvement; Housing; Physical Health; Resilience   Sleep  Sleep: No data recorded okay   Physical Exam: Physical Exam Vitals reviewed.  Constitutional:      General: He is not in acute distress.    Appearance: He is not toxic-appearing.  Pulmonary:     Effort: Pulmonary effort is normal. No respiratory distress.  Neurological:     Mental Status: He is alert.     Motor: No weakness.     Gait: Gait normal.  Psychiatric:        Mood and Affect: Mood normal.        Behavior: Behavior normal.        Thought Content: Thought content normal.        Judgment: Judgment normal.    Review of Systems  Constitutional:  Negative for chills and fever.  Cardiovascular:  Negative for chest pain and palpitations.  Neurological:  Negative for dizziness, tingling, tremors and headaches.  Psychiatric/Behavioral:  Negative for depression, hallucinations, memory loss, substance abuse and suicidal ideas. The patient is nervous/anxious. The patient does not have insomnia.   All other systems reviewed and are negative.  Blood pressure (!) 145/107, pulse 82, temperature 98.4 F (36.9 C), temperature source Oral, resp. rate 20, height 5\' 7"  (1.702 m), weight 106.1 kg, SpO2 98 %. Body mass index is 36.65 kg/m.   Treatment Plan Summary: Daily contact with patient to assess and evaluate symptoms and progress in treatment and Medication management  Physician Treatment Plan for Primary Diagnosis:  Assessment: Schizophrenia  (HCC) Polysubstance use   Plan: Medications: Continue Abilify 15 mg once daily for schizophrenia  Continue trazodone 50 mg p.o. q. nightly as needed for insomnia. Continue hydroxyzine 25 mg p.o. 3 times daily as needed for anxiety   Medications for medical conditions: -INCREASE Amlodipine Norvasc tablet to 10 mg p.o. daily for high blood pressure -Continue clonidine 0.1 mg PRN for high BP   Apixaban Eliquis tablet 5 mg p.o. every 12 hours as needed for PE Atorvastatin Lipitor tablet 40 mg p.o. daily for hyperlipidemia Nicotine NicoDerm CQ dose in milligrams per 24 hours 14 mg patch transdermal for smoking cessation Continue Insulin glargine Semglee injection 22 units subcu daily for diabetes Increase novolog to 8 U tid with meals  Continue  insulin sliding scale  Potassium chloride 40 mill equivalent x 1 dose today 07/13/2022    Other PRN Medications -Acetaminophen 650 mg every 6 as needed/mild pain -Maalox 30 mL oral every 4 as needed/digestion -Magnesium hydroxide 30 mL daily as needed/mild constipation   -- The risks/benefits/side-effects/alternatives to this medication were discussed in detail with the patient and time was given for questions. The patient consents to medication trial.              -- Encouraged patient to participate  in unit milieu and in scheduled group therapies      Safety and Monitoring: Voluntary admission to inpatient psychiatric unit for safety, stabilization and treatment Daily contact with patient to assess and evaluate symptoms and progress in treatment Patient's case to be discussed in multi-disciplinary team meeting Observation Level : q15 minute checks Vital signs: q12 hours Precautions: suicide, but pt currently verbally contracts for safety on unit    Discharge Planning: Social work and case management to assist with discharge planning and identification of hospital follow-up needs prior to discharge Estimated LOS: Discharge plan pending,  the patient is accepted to [facility] for residential substance use treatment Discharge Concerns: Need to establish a safety plan; Medication compliance and effectiveness Discharge Goals: Return home with outpatient referrals for mental health follow-up including medication management/psychotherapy.    Cristy Hilts, MD 07/19/2022, 12:19 PM  Total Time Spent in Direct Patient Care:  I personally spent 25 minutes on the unit in direct patient care. The direct patient care time included face-to-face time with the patient, reviewing the patient's chart, communicating with other professionals, and coordinating care. Greater than 50% of this time was spent in counseling or coordinating care with the patient regarding goals of hospitalization, psycho-education, and discharge planning needs.   Phineas Inches, MD Psychiatrist

## 2022-07-20 DIAGNOSIS — F200 Paranoid schizophrenia: Secondary | ICD-10-CM

## 2022-07-20 LAB — GLUCOSE, CAPILLARY
Glucose-Capillary: 123 mg/dL — ABNORMAL HIGH (ref 70–99)
Glucose-Capillary: 206 mg/dL — ABNORMAL HIGH (ref 70–99)

## 2022-07-20 MED ORDER — HYDROXYZINE HCL 25 MG PO TABS: 25.0000 mg | ORAL_TABLET | Freq: Three times a day (TID) | ORAL | 0 refills | Status: DC | PRN

## 2022-07-20 MED ORDER — TRAZODONE HCL 50 MG PO TABS: 50.0000 mg | ORAL_TABLET | Freq: Every evening | ORAL | 0 refills | Status: DC | PRN

## 2022-07-20 MED ORDER — ATORVASTATIN CALCIUM 40 MG PO TABS
40.0000 mg | ORAL_TABLET | Freq: Every day | ORAL | 0 refills | Status: AC
Start: 2022-07-20 — End: ?

## 2022-07-20 MED ORDER — NICOTINE 14 MG/24HR TD PT24: 14.0000 mg | MEDICATED_PATCH | Freq: Every day | TRANSDERMAL | 0 refills | Status: DC

## 2022-07-20 MED ORDER — ARIPIPRAZOLE 15 MG PO TABS: 15.0000 mg | ORAL_TABLET | Freq: Every day | ORAL | 0 refills | Status: AC

## 2022-07-20 MED ORDER — APIXABAN 5 MG PO TABS: 5.0000 mg | ORAL_TABLET | Freq: Two times a day (BID) | ORAL | 0 refills | Status: DC

## 2022-07-20 MED ORDER — AMLODIPINE BESYLATE 10 MG PO TABS: 10.0000 mg | ORAL_TABLET | Freq: Every day | ORAL | 0 refills | Status: AC

## 2022-07-20 NOTE — Discharge Summary (Signed)
Physician Discharge Summary Note  Patient:  Henry Munoz is an 57 y.o., male MRN:  [id number] DOB:  05-17-1965 Patient phone:  [phone] (home)  Patient address:   [address] 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  Patient presents voluntarily to [facility] from [facility] after stabilization for suicidal ideation x 2 weeks with plans to harm himself or steal from people in the context of crack cocaine smoking.     Principal Problem: Schizophrenia [facility]) Discharge Diagnoses: Principal Problem:   Schizophrenia (HCC)  Past Psychiatric History: Previous Psych Diagnoses: Schizophrenia, alcohol use dependence, auditory hallucination, cocaine abuse, MDD, suicide ideation. Prior inpatient treatment: Yes, seen at [facility] Current/prior outpatient treatment: Denies Prior rehab hx: Denies Psychotherapy hx: Yes History of suicide: Yes x 2 History of homicide or aggression: Denies Psychiatric medication history: Yes, being treated with Seroquel while in prison. Psychiatric medication compliance history: Noncompliance Neuromodulation history: Denies Current Psychiatrist: Denies Current therapist: Denies  Past Medical History:  Past Medical History:  Diagnosis Date   Alcohol abuse    Cocaine abuse (HCC)    Diabetes mellitus without complication (HCC)    Diastolic dysfunction    a. 06/2022 Echo (in setting of acute PE):  EF 50-55%, no rwma, mod-sev LVH, grI DD, mod reduced RV fxn, triv MR w/ MVP of post leaflet, ? bicuspid AoV w/ triv AI and Ao  sclerosis.   Hypertension    Pulmonary embolism (HCC)    a. 06/2022 CTA: Large volume PE affecting all lobar braches w/ R heart strain-->s/p mech thrombectomy.   Schizo-affective schizophrenia (HCC)    Stroke [facility])    TIA (transient ischemic attack)    Tobacco abuse     Past Surgical History:  Procedure Laterality Date   PULMONARY THROMBECTOMY Bilateral 06/30/2022   Procedure: PULMONARY THROMBECTOMY;  Surgeon: Renford Dills, MD;  Location: [facility] INVASIVE CV LAB;  Service: Cardiovascular;  Laterality: Bilateral;   Family History:  Family History  Problem Relation Age of Onset   High blood pressure Father    Diabetes Father    Renal Disease Father    Family Psychiatric  History: See H&P  Social History:  Social History   Substance and Sexual Activity  Alcohol Use Yes   Alcohol/week: 2.0 standard drinks of alcohol   Types: 2 Cans of beer per week   Comment: per week. 07/11/22 states does not drink     Social History   Substance and Sexual Activity  Drug Use Yes   Types: Cocaine   Comment: Uses weekly. also crack    Social History   Socioeconomic History   Marital status: Single    Spouse name: Not on file   Number of children: Not on file   Years of education: Not on file   Highest education level: Not on file  Occupational History   Not on file  Tobacco Use   Smoking status: Every Day    Packs/day: 0.50    Years: 15.00    Additional pack years: 0.00    Total pack years: 7.50    Types: Cigarettes   Smokeless tobacco: Never  Substance and Sexual Activity  Alcohol use: Yes    Alcohol/week: 2.0 standard drinks of alcohol    Types: 2 Cans of beer per week    Comment: per week. 07/11/22 states does not drink   Drug use: Yes    Types: Cocaine    Comment: Uses weekly. also crack   Sexual activity: Not on file  Other Topics Concern   Not on file  Social History Narrative   Lives locally.  Does not routinely exercise.   Social Determinants of Health    Financial Resource Strain: Low Risk  (10/13/2017)   Overall Financial Resource Strain (CARDIA)    Difficulty of Paying Living Expenses: Not hard at all  Food Insecurity: No Food Insecurity (07/12/2022)   Hunger Vital Sign    Worried About Running Out of Food in the Last Year: Never true    Ran Out of Food in the Last Year: Never true  Transportation Needs: No Transportation Needs (07/12/2022)   PRAPARE - Administrator, Civil Service (Medical): No    Lack of Transportation (Non-Medical): No  Recent Concern: Transportation Needs - Unmet Transportation Needs (07/04/2022)   PRAPARE - Transportation    Lack of Transportation (Medical): Yes    Lack of Transportation (Non-Medical): Yes  Physical Activity: Inactive (10/13/2017)   Exercise Vital Sign    Days of Exercise per Week: 0 days    Minutes of Exercise per Session: 0 min  Stress: No Stress Concern Present (10/13/2017)   [employer] of Occupational Health - Occupational Stress Questionnaire    Feeling of Stress : Not at all  Social Connections: Somewhat Isolated (10/13/2017)   Social Connection and Isolation Panel [NHANES]    Frequency of Communication with Friends and Family: Three times a week    Frequency of Social Gatherings with Friends and Family: Never    Attends Religious Services: 1 to 4 times per year    Active Member of [employer] or Organizations: No    Attends Banker Meetings: Never    Marital Status: Separated   Hospital Course:  During the patient's hospitalization, patient had extensive initial psychiatric evaluation, and follow-up psychiatric evaluations every day.  Psychiatric diagnoses provided upon initial assessment:   Schizophrenia   Patient's psychiatric medications were adjusted on admission:  Abilify 5 mg p.o. daily for mood stabilization  Trazodone 50 mg p.o. q. nightly as needed for insomnia. Hydroxyzine 25 mg p.o. 3 times daily as needed for anxiety   During the hospitalization,  other adjustments were made to the patient's psychiatric medication regimen:  Abilify increased to 15 mg p.o. daily for mood stabilization.  Patient's care was discussed during the interdisciplinary team meeting every day during the hospitalization.  The patient denies having side effects to prescribed psychiatric medication.  Gradually, patient started adjusting to milieu. The patient was evaluated each day by a clinical provider to ascertain response to treatment. Improvement was noted by the patient's report of decreasing symptoms, improved sleep and appetite, affect, medication tolerance, behavior, and participation in unit programming.  Patient was asked each day to complete a self inventory noting mood, mental status, pain, new symptoms, anxiety and concerns.    Symptoms were reported as significantly decreased or resolved completely by discharge.   On day of discharge, the patient reports that their mood is stable. The patient denied having suicidal thoughts for more than 48 hours prior to discharge.  Patient denies having homicidal thoughts.  Patient denies having auditory hallucinations.  Patient denies any visual hallucinations or  other symptoms of psychosis. The patient was motivated to continue taking medication with a goal of continued improvement in mental health.   The patient reports their target psychiatric symptoms of psychosis responded well to the psychiatric medications, and the patient reports overall benefit other psychiatric hospitalization. Supportive psychotherapy was provided to the patient. The patient also participated in regular group therapy while hospitalized. Coping skills, problem solving as well as relaxation therapies were also part of the unit programming.  Labs were reviewed with the patient, and abnormal results were discussed with the patient.  The patient is able to verbalize their individual safety plan to this provider.  # It is recommended to the patient  to continue psychiatric medications as prescribed, after discharge from the hospital.    # It is recommended to the patient to follow up with your outpatient psychiatric provider and PCP.  # It was discussed with the patient, the impact of alcohol, drugs, tobacco have been there overall psychiatric and medical wellbeing, and total abstinence from substance use was recommended the patient.ed.  # Prescriptions provided or sent directly to preferred pharmacy at discharge. Patient agreeable to plan. Given opportunity to ask questions. Appears to feel comfortable with discharge.    # In the event of worsening symptoms, the patient is instructed to call the crisis hotline, 911 and or go to the nearest ED for appropriate evaluation and treatment of symptoms. To follow-up with primary care provider for other medical issues, concerns and or health care needs  # Patient was discharged to home with a plan to follow up as noted below.  Physical Findings: AIMS:  , ,  ,  ,    CIWA:    COWS:     Musculoskeletal: Strength & Muscle Tone: within normal limits Gait & Station: normal Patient leans: N/A  Psychiatric Specialty Exam:  Presentation  General Appearance:  Appropriate for Environment; Casual; Fairly Groomed  Eye Contact: Good  Speech: Normal Rate; Clear and Coherent  Speech Volume: Normal  Handedness: Right  Mood and Affect  Mood: Euthymic  Affect: Appropriate; Congruent; Full Range  Thought Process  Thought Processes: Linear  Descriptions of Associations:Intact  Orientation:Full (Time, Place and Person)  Thought Content:Logical  History of Schizophrenia/Schizoaffective disorder:No  Duration of Psychotic Symptoms:Less than six months  Hallucinations:Hallucinations: None  Ideas of Reference:None  Suicidal Thoughts:Suicidal Thoughts: No  Homicidal Thoughts:Homicidal Thoughts: No  Sensorium  Memory: Immediate Good; Recent Good; Remote  Good  Judgment: Fair  Insight: Fair  Art therapist  Concentration: Fair  Attention Span: Fair  Recall: Good  Fund of Knowledge: Good  Language: Good  Psychomotor Activity  Psychomotor Activity: Psychomotor Activity: Normal  Assets  Assets: Communication Skills; Desire for Improvement; Housing; Physical Health; Resilience  Sleep  Sleep: Sleep: Fair  Physical Exam: Physical Exam Vitals and nursing note reviewed.  Constitutional:      Appearance: He is obese.  HENT:     Head: Normocephalic.     Nose: Nose normal.     Mouth/Throat:     Mouth: Mucous membranes are moist.     Pharynx: Oropharynx is clear.  Eyes:     Extraocular Movements: Extraocular movements intact.     Pupils: Pupils are equal, round, and reactive to light.  Cardiovascular:     Rate and Rhythm: Normal rate.     Pulses: Normal pulses.     Comments: Blood pressure 150/96, pulse 81.  Patient has history of hypertension, diabetes, CVA. Pulmonary:     Effort: Pulmonary  effort is normal.     Comments: Patient has history of PE Abdominal:     Comments: Deferred  Genitourinary:    Comments: deferred Musculoskeletal:        General: Normal range of motion.     Cervical back: Normal range of motion.  Skin:    General: Skin is warm.  Neurological:     General: No focal deficit present.     Mental Status: He is alert and oriented to person, place, and time. Mental status is at baseline.  Psychiatric:        Mood and Affect: Mood normal.        Behavior: Behavior normal.        Thought Content: Thought content normal.    Review of Systems  Constitutional:  Negative for chills and fever.  HENT:  Negative for sore throat.   Eyes:  Negative for discharge and redness.  Respiratory:  Negative for cough, shortness of breath and wheezing.   Cardiovascular:  Negative for chest pain and palpitations.       History of high blood pressure, diabetes, and CVA.  Gastrointestinal:  Negative  for heartburn, nausea and vomiting.  Genitourinary:  Negative for dysuria.  Musculoskeletal: Negative.        History of diabetic neuropathy  Skin:  Negative for itching and rash.  Neurological: Negative.  Negative for dizziness and headaches.       History of diabetic neuropathy  Endo/Heme/Allergies:        See allergy listing  Psychiatric/Behavioral:  Positive for hallucinations (Improved with medication) and substance abuse. The patient is nervous/anxious (Improved with medication) and has insomnia (Improved with medication).    Blood pressure (!) 150/96, pulse 81, temperature 98.2 F (36.8 C), temperature source Oral, resp. rate 20, height 5\' 7"  (1.702 m), weight 106.1 kg, SpO2 100 %. Body mass index is 36.65 kg/m.   Social History   Tobacco Use  Smoking Status Every Day   Packs/day: 0.50   Years: 15.00   Additional pack years: 0.00   Total pack years: 7.50   Types: Cigarettes  Smokeless Tobacco Never   Tobacco Cessation:  A prescription for an FDA-approved tobacco cessation medication provided at discharge   Blood Alcohol level:  Lab Results  Component Value Date   [facility] <10 07/11/2022   ETH <10 06/29/2022    Metabolic Disorder Labs:  Lab Results  Component Value Date   HGBA1C 14.0 (H) 06/29/2022   MPG 355.1 06/29/2022   MPG 315 08/10/2016   No results found for: "PROLACTIN" Lab Results  Component Value Date   CHOL 251 (H) 06/29/2022   TRIG 96 06/29/2022   HDL 26 (L) 06/29/2022   CHOLHDL 9.7 06/29/2022   VLDL 19 06/29/2022   LDLCALC 206 (H) 06/29/2022   LDLCALC 152 (H) 08/10/2016    See Psychiatric Specialty Exam and Suicide Risk Assessment completed by Attending Physician prior to discharge.  Discharge destination:  Home  Is patient on multiple antipsychotic therapies at discharge:  No   Has Patient had three or more failed trials of antipsychotic monotherapy by history:  No  Recommended Plan for Multiple Antipsychotic Therapies: NA  Discharge  Instructions     Diet - low sodium heart healthy   Complete by: As directed    Increase activity slowly   Complete by: As directed       Allergies as of 07/20/2022       Reactions   Haldol [haloperidol Lactate]  Medication List     STOP taking these medications    Antifungal Clotrimazole 1 % cream Generic drug: clotrimazole   polyethylene glycol 17 g packet Commonly known as: MIRALAX / GLYCOLAX       TAKE these medications      Indication  amLODipine 10 MG tablet Commonly known as: NORVASC Take 1 tablet (10 mg total) by mouth daily. Start taking on: July 21, 2022 What changed:  medication strength how much to take  Indication: High Blood Pressure Disorder   apixaban 5 MG Tabs tablet Commonly known as: ELIQUIS Take 1 tablet (5 mg total) by mouth every 12 (twelve) hours. What changed:  how much to take when to take this additional instructions  Indication: Cerebrovascular accident secondary to Atrial Fibrillation   ARIPiprazole 15 MG tablet Commonly known as: ABILIFY Take 1 tablet (15 mg total) by mouth daily. Start taking on: July 21, 2022  Indication: MIXED BIPOLAR AFFECTIVE DISORDER   atorvastatin 40 MG tablet Commonly known as: LIPITOR Take 1 tablet (40 mg total) by mouth daily.  Indication: High Amount of Fats in the Blood   Blood Glucose Monitoring Suppl Devi 1 each by Does not apply route 3 (three) times daily. May dispense any manufacturer covered by patient's insurance.  Indication: diabetes   BLOOD GLUCOSE TEST STRIPS Strp 1 each by Does not apply route 3 (three) times daily. Use as directed to check blood sugar. May dispense any manufacturer covered by patient's insurance and fits patient's device.  Indication: diabetes   hydrOXYzine 25 MG tablet Commonly known as: ATARAX Take 1 tablet (25 mg total) by mouth 3 (three) times daily as needed for anxiety.  Indication: Feeling Anxious   insulin aspart 100 UNIT/ML FlexPen Commonly  known as: NOVOLOG If eating and Blood Glucose (BG) 80 or higher inject 4 units for meal coverage and add correction dose per scale. If not eating, correction dose only. BG <150= 0 unit; BG 150-200= 1 unit; BG 201-250= 2 unit; BG 251-300= 3 unit; BG 301-350= 4 unit; BG 351-400= 5 unit; BG >400= 6 unit and Call Primary care.  Indication: Insulin-Dependent Diabetes   insulin glargine 100 UNIT/ML Solostar Pen Commonly known as: LANTUS Inject 20 Units into the skin daily. May substitute as needed per insurance.  Indication: Insulin-Dependent Diabetes   Lancet Device Misc 1 each by Does not apply route 3 (three) times daily. May dispense any manufacturer covered by patient's insurance.  Indication: diabetes   Lancets Misc 1 each by Does not apply route 3 (three) times daily. Use as directed to check blood sugar. May dispense any manufacturer covered by patient's insurance and fits patient's device.  Indication: diabetes   nicotine 14 mg/24hr patch Commonly known as: NICODERM CQ - dosed in mg/24 hours Place 1 patch (14 mg total) onto the skin daily. Start taking on: July 21, 2022  Indication: Nicotine Addiction   Pen Needles 31G X 5 MM Misc 1 each by Does not apply route 3 (three) times daily. May dispense any manufacturer covered by patient's insurance.  Indication: diabetes   traZODone 50 MG tablet Commonly known as: DESYREL Take 1 tablet (50 mg total) by mouth at bedtime as needed for sleep.  Indication: Trouble Sleeping        Follow-up Information     [employer] Go on 07/21/2022.   Why: You have a hospital follow up appointment on 07/21/22 at 9:00 am. This appointment will be held in person. Following this appointment, you will  be scheduled for a clinical assessment, to obtain therapy and medication management services. Contact information: [address] [place] [address] [phone]                Follow-up recommendations:    Discharge  Recommendations:  The patient is being discharged with his family. Patient is to take his discharge medications as ordered.  See follow up above. We recommend that he participates in individual therapy to target uncontrollable agitation and substance abuse.  We recommend that he participates in family therapy to target the conflict with his family, to improve communication skills and conflict resolution skills.  Family is to initiate/implement a contingency based behavioral model to address patient's behavior. We recommend that he gets AIMS scale, height, weight, blood pressure, fasting lipid panel, fasting blood sugar in three months from discharge if he's on atypical antipsychotics.  Patient will benefit from monitoring of recurrent suicidal ideation since patient is on antidepressant medication. The patient should abstain from all illicit substances and alcohol.  If the patient's symptoms worsen or do not continue to improve or if the patient becomes actively suicidal or homicidal then it is recommended that the patient return to the closest hospital emergency room or call 911 for further evaluation and treatment. National Suicide Prevention Lifeline 1800-SUICIDE or 417-022-2784. Please follow up with your primary medical doctor for all other medical needs.  The patient has been educated on the possible side effects to medications and he/his guardian is to contact a medical professional and inform outpatient provider of any new side effects of medication. He is to take regular diet and activity as tolerated.  Will benefit from moderate daily exercise. Patient/Family was educated about removing/locking any firearms, medications or dangerous products from the home.   Activity:  As tolerated Diet:  Regular Diet  Signed:  Cecilie Lowers, FNP 07/20/2022, 12:06 PM

## 2022-07-20 NOTE — Plan of Care (Signed)
  Problem: Education: Goal: Ability to describe self-care measures that may prevent or decrease complications (Diabetes Survival Skills Education) will improve Outcome: Progressing   Problem: Tissue Perfusion: Goal: Adequacy of tissue perfusion will improve Outcome: Progressing   Problem: Health Behavior/Discharge Planning: Goal: Ability to manage health-related needs will improve Outcome: Progressing

## 2022-07-20 NOTE — Transportation (Signed)
07/20/2022  Henry Munoz DOB: 04-25-1965 MRN: [id number]   RIDER WAIVER AND RELEASE OF LIABILITY  For the purposes of helping with transportation needs, [facility] partners with outside transportation providers (taxi companies, [place], Catering manager.) to give [employer] patients or other approved people the choice of on-demand rides [employer]") to our buildings for non-emergency visits.  By using [employer], I, the person signing this document, on behalf of myself and/or any legal minors (in my care using the [employer]), agree:  Science writer given to me are supplied by independent, outside transportation providers who do not work for, or have any affiliation with, [employer]. [facility] is not a transportation company. [facility] has no control over the quality or safety of the rides I get using [employer]. [facility] has no control over whether any outside ride will happen on time or not. [facility] gives no guarantee on the reliability, quality, safety, or availability on any rides, or that no mistakes will happen. I know and accept that traveling by vehicle (car, truck, SVU, Zenaida Niece, bus, taxi, etc.) has risks of serious injuries such as disability, being paralyzed, and death. I know and agree the risk of using [employer] is mine alone, and not [employer]. Transport Services are provided "as is" and as are available. The transportation providers are in charge for all inspections and care of the vehicles used to provide these rides. I agree not to take legal action against [facility], its agents, employees, officers, directors, representatives, insurers, attorneys, assigns, successors, subsidiaries, and affiliates at any time for any reasons related directly or indirectly to using [employer]. I also agree not to take legal action against [facility] or its affiliates for any injury, death, or damage to property caused by or related to using  [employer]. I have read this Waiver and Release of Liability, and I understand the terms used in it and their legal meaning. This Waiver is freely and voluntarily given with the understanding that my right (or any legal minors) to legal action against [facility] relating to [employer] is knowingly given up to use these services.   I attest that I read the Ride Waiver and Release of Liability to Henry Munoz, gave Mr. Pehl the opportunity to ask questions and answered the questions asked (if any). I affirm that Henry Munoz then provided consent for assistance with transportation.

## 2022-07-20 NOTE — Discharge Instructions (Signed)
-  Follow-up with your outpatient psychiatric provider -instructions on appointment date, time, and address (location) are provided to you in discharge paperwork.  -Take your psychiatric medications as prescribed at discharge - instructions are provided to you in the discharge paperwork  -Follow-up with outpatient primary care doctor and other specialists -for management of preventative medicine and any chronic medical disease.  -Recommend abstinence from alcohol, tobacco, and other illicit drug use at discharge.   -If your psychiatric symptoms recur, worsen, or if you have side effects to your psychiatric medications, call your outpatient psychiatric provider, 911, 988 or go to the nearest emergency department.  -If suicidal thoughts occur, call your outpatient psychiatric provider, 911, 988 or go to the nearest emergency department.  Naloxone (Narcan) can help reverse an overdose when given to the victim quickly.  [place] offers free naloxone kits and instructions/training on its use.  Add naloxone to your first aid kit and you can help save a life.   Pick up your free kit at the following locations:   [place]:  [facility], [address] [place] [address] ([phone]) [facility] [address] [place] [id number] ([phone]) [facility] [address] [place] [address]  [place]: [facility] [address] [place] [address] ([phone]) [facility] [address] [place] [address] ([phone])  

## 2022-07-20 NOTE — Progress Notes (Signed)
Patient discharged from [facility] on 07/20/22 at 1220. Patient denies SI, plan, and intention. Suicide safety plan completed, reviewed with this RN, given to the patient, and a copy in the chart. Patient denies HI/AVH upon discharge. Patient is alert, oriented, and cooperative. RN provided patient with discharge paperwork and reviewed information with patient. Patient expressed that he understood all of the discharge instructions. Pt was satisfied with belongings returned to him from the locker and at bedside. Discharged patient to [facility] waiting room.

## 2022-07-20 NOTE — Progress Notes (Signed)
   07/20/22 0853  Psych Admission Type (Psych Patients Only)  Admission Status Voluntary  Psychosocial Assessment  Patient Complaints None  Eye Contact Fair  Facial Expression Flat  Affect Appropriate to circumstance  Speech Logical/coherent  Interaction Assertive  Motor Activity Slow;Unsteady  Appearance/Hygiene Unremarkable  Behavior Characteristics Guarded  Mood Anxious;Depressed  Thought Process  Coherency WDL  Content WDL  Delusions None reported or observed  Perception WDL  Hallucination None reported or observed  Judgment Impaired  Confusion None  Danger to Self  Current suicidal ideation? Denies  Self-Injurious Behavior No self-injurious ideation or behavior indicators observed or expressed   Agreement Not to Harm Self Yes  Description of Agreement Pt verbally contracts for safety  Danger to Others  Danger to Others None reported or observed

## 2022-07-20 NOTE — Progress Notes (Signed)
   07/20/22 0644  15 Minute Checks  Visual Appearance Calm  Behavior Composed  Sleep (Behavioral Health Patients Only)  Calculate sleep? (Click Yes once per 24 hr at 0600 safety check) Yes  Documented sleep last 24 hours 10.25

## 2022-07-20 NOTE — BHH Counselor (Signed)
BHH/BMU LCSW Progress Note   07/20/2022    9:27 AM  Henry Munoz      Type of Note: [facility] Referral    Patient was denied from residential, per nurse patient was denied because of his schizophrenia and not being compliant with his diabetes medication. Patient and doctor was made aware of denial from [facility].     Signed:   Jacob Moores, MSW, [facility] 07/20/2022 9:27 AM

## 2022-07-20 NOTE — Progress Notes (Signed)
  [facility] Adult Case Management Discharge Plan :  Will you be returning to the same living situation after discharge:  Yes,  Patient will be returning back to his mom home with brother  At discharge, do you have transportation home?: No.CSW will arrange a Taxi for patient  Do you have the ability to pay for your medications: Yes,  LME Medicaid [facility]   Release of information consent forms completed and in the chart;  Patient's signature needed at discharge.  Patient to Follow up at:  Follow-up Information     [facility] Go on 07/21/2022.   Why: You have a hospital follow up appointment on 07/21/22 at 9:00 am. This appointment will be held in person. Following this appointment, you will be scheduled for a clinical assessment, to obtain therapy and medication management services. Contact information: [address] [place] [address] [phone]                 Next level of care provider has access to [facility] Link:no  Safety Planning and Suicide Prevention discussed: Yes,  Brother ; Bishop      Has patient been referred to the Quitline?: Patient refused referral for treatment  Patient has been referred for addiction treatment: Yes, referral information given but appointment not made because he was denied due to his schizophrenia Isabella Bowens, LCSWA 07/20/2022, 9:39 AM

## 2022-07-20 NOTE — BHH Suicide Risk Assessment (Signed)
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  Patient presents voluntarily to [facility] from [facility] after stabilization for suicidal ideation x 2 weeks with plans to harm himself or steal from people in the context of crack cocaine smoking.    Total Time spent with patient: 30 minutes  Musculoskeletal: Strength & Muscle Tone: within normal limits Gait & Station: normal Patient leans: N/A  Psychiatric Specialty Exam  Presentation  General Appearance:  Appropriate for Environment; Casual; Fairly Groomed  Eye Contact: Good  Speech: Normal Rate; Clear and Coherent  Speech Volume: Normal  Handedness: Right  Mood and Affect  Mood: Euthymic  Duration of Depression Symptoms: Greater than two weeks  Affect: Appropriate; Congruent; Full Range  Thought Process  Thought Processes: Linear  Descriptions of Associations:Intact  Orientation:Full (Time, Place and Person)  Thought Content:Logical  History of Schizophrenia/Schizoaffective disorder:No  Duration of Psychotic Symptoms:Less than six months  Hallucinations:Hallucinations: None  Ideas of Reference:None  Suicidal Thoughts:Suicidal Thoughts: No  Homicidal Thoughts:Homicidal Thoughts: No  Sensorium  Memory: Immediate Good; Recent Good; Remote Good  Judgment: Fair  Insight: Fair  Art therapist  Concentration: Fair  Attention Span: Fair  Recall: Good  Fund of  Knowledge: Good  Language: Good  Psychomotor Activity  Psychomotor Activity: Psychomotor Activity: Normal  Assets  Assets: Communication Skills; Desire for Improvement; Housing; Physical Health; Resilience  Sleep  Sleep: Sleep: Fair  Physical Exam: Physical Exam Vitals reviewed.  Constitutional:      Appearance: He is obese.  HENT:     Head: Normocephalic.     Nose: Nose normal.     Mouth/Throat:     Mouth: Mucous membranes are moist.     Pharynx: Oropharynx is clear.  Eyes:     Extraocular Movements: Extraocular movements intact.     Pupils: Pupils are equal, round, and reactive to light.  Cardiovascular:     Rate and Rhythm: Normal rate.     Pulses: Normal pulses.     Comments: Blood pressure 150/96, pulse 81.  Patient has history of peripheral vascular disease, diabetes, hypertension, and CVA. Pulmonary:     Effort: Pulmonary effort is normal.  Abdominal:     Comments: Deferred  Genitourinary:    Comments: Deferred Musculoskeletal:        General: Normal range of motion.     Cervical back: Normal range of motion.  Skin:    General: Skin is warm.  Neurological:     Mental Status: He is alert and oriented to person, place, and time.  Psychiatric:        Mood and Affect: Mood normal.        Behavior: Behavior normal.        Thought Content: Thought content normal.    Review of Systems  Constitutional:  Negative for chills and fever.  HENT:  Negative for sore throat.   Eyes:  Negative for pain and discharge.  Respiratory:         History of PE  Cardiovascular:        History of hypertension, diabetes, CVA.  Gastrointestinal:  Negative for heartburn, nausea and vomiting.  Genitourinary: Negative.        History of acute kidney injury  Musculoskeletal: Negative.   Skin:  Negative for itching and rash.  Neurological:  Negative for dizziness, tingling and headaches.  Endo/Heme/Allergies:        See allergy listing  Psychiatric/Behavioral:  Positive  for depression (Stable with medication), hallucinations (Improved with medication) and substance abuse. The patient is nervous/anxious (Improved with medication) and has insomnia (Improved with medication).    Blood pressure (!) 150/96, pulse 81, temperature 98.2 F (36.8 C), temperature source Oral, resp. rate 20, height 5\' 7"  (1.702 m), weight 106.1 kg, SpO2 100 %. Body mass index is 36.65 kg/m.  Mental Status Per Nursing Assessment::   On Admission:  Suicidal ideation indicated by others  Demographic Factors:  Male, Low socioeconomic status, and Unemployed  Loss Factors: Decrease in vocational status and Financial problems/change in socioeconomic status  Historical Factors: Prior suicide attempts and Impulsivity  Risk Reduction Factors:   Living with another person, especially a relative, Positive social support, Positive therapeutic relationship, and Positive coping skills or problem solving skills  Continued Clinical Symptoms:  Depression:   Anhedonia Impulsivity Insomnia Recent sense of peace/wellbeing Schizophrenia:   Depressive state Paranoid or undifferentiated type More than one psychiatric diagnosis Previous Psychiatric Diagnoses and Treatments Medical Diagnoses and Treatments/Surgeries  Cognitive Features That Contribute To Risk:  Polarized thinking    Suicide Risk:  Mild:  Suicidal ideation of limited frequency, intensity, duration, and specificity.  There are no identifiable plans, no associated intent, mild dysphoria and related symptoms, good self-control (both objective and subjective assessment), few other risk factors, and identifiable protective factors, including available and accessible social support.   Follow-up Information     [facility] Go on 07/21/2022.   Why: You have a hospital follow up appointment on 07/21/22 at 9:00 am. This appointment will be held in person. Following this appointment, you will be scheduled for a clinical  assessment, to obtain therapy and medication management services. Contact information: [address] [place] [address] [phone]                 Plan Of Care/Follow-up recommendations:  Discharge Recommendations:  The patient is being discharged with his family. Patient is to take his discharge medications as ordered.  See follow up above. We recommend that he participates in individual therapy to target uncontrollable agitation and substance abuse.  We recommend that he participates in family therapy to target the conflict with his family, to improve communication skills and conflict resolution skills.  Family is to initiate/implement a contingency based behavioral model to address patient's behavior. We recommend that he gets AIMS scale, height, weight, blood pressure, fasting lipid panel, fasting blood sugar in three months from discharge if he's on atypical antipsychotics.  Patient will benefit from monitoring of recurrent suicidal ideation since patient is on antidepressant medication. The patient should abstain from all illicit substances and alcohol.  If the patient's symptoms worsen or do not continue to improve or if the patient becomes actively suicidal or homicidal then it is recommended that the patient return to the closest hospital emergency room or call 911 for further evaluation and treatment. National Suicide Prevention Lifeline 1800-SUICIDE or 7811026275. Please follow up with your primary medical doctor for all other medical needs.  The patient has been educated on the possible side effects to medications and he/his guardian is to contact a medical professional and inform outpatient provider of any new side effects  of medication. He is to take regular diet and activity as tolerated.  Will benefit from moderate daily exercise. Patient/Family was educated about removing/locking any firearms, medications or dangerous products from the home.   Activity:   As tolerated Diet:  Regular Diet  Cecilie Lowers, FNP 07/20/2022, 11:45 AM

## 2022-07-20 NOTE — Progress Notes (Addendum)
  Patient alert and oriented x 4, patient stated "I have been sick all day, I have cold, that is the reason I'm staying in bed." Patient rated his anxiety 7/10, depression 3/10, denies SI, HI, and AVH. He stated his mood was "terrible, because I don't want to be here, I want to go home." Support and encouragement offered. Patient verbally contract for safety. Patient compliant, no side effects, no physical symptoms noted. Safety maintained at Q 15 minutes intervals.   07/19/22 2150  Psych Admission Type (Psych Patients Only)  Admission Status Voluntary  Psychosocial Assessment  Patient Complaints Depression  Eye Contact Fair  Facial Expression Flat  Affect Appropriate to circumstance  Speech Logical/coherent  Interaction Assertive  Motor Activity Slow  Appearance/Hygiene Unremarkable  Behavior Characteristics Anxious;Guarded  Thought Process  Coherency WDL  Content WDL  Delusions None reported or observed  Perception WDL  Hallucination None reported or observed  Judgment Impaired  Confusion None  Danger to Self  Current suicidal ideation? Denies  Agreement Not to Harm Self Yes  Description of Agreement Verbal Contract  Danger to Others  Danger to Others None reported or observed

## 2022-07-23 ENCOUNTER — Other Ambulatory Visit: Payer: Self-pay

## 2022-07-23 ENCOUNTER — Emergency Department
Admission: EM | Admit: 2022-07-23 | Discharge: 2022-07-23 | Disposition: A | Discharge status: Home / Self Care | Payer: No Typology Code available for payment source | Attending: Emergency Medicine | Admitting: Emergency Medicine

## 2022-07-23 DIAGNOSIS — Y900 Blood alcohol level of less than 20 mg/100 ml: Secondary | ICD-10-CM | POA: Diagnosis not present

## 2022-07-23 DIAGNOSIS — F1420 Cocaine dependence, uncomplicated: Secondary | ICD-10-CM | POA: Diagnosis present

## 2022-07-23 DIAGNOSIS — E119 Type 2 diabetes mellitus without complications: Secondary | ICD-10-CM | POA: Insufficient documentation

## 2022-07-23 DIAGNOSIS — F1920 Other psychoactive substance dependence, uncomplicated: Secondary | ICD-10-CM

## 2022-07-23 LAB — CBC
HCT: 37.9 % — ABNORMAL LOW (ref 39.0–52.0)
Hemoglobin: 12.1 g/dL — ABNORMAL LOW (ref 13.0–17.0)
MCH: 30.9 pg (ref 26.0–34.0)
MCHC: 31.9 g/dL (ref 30.0–36.0)
MCV: 96.9 fL (ref 80.0–100.0)
Platelets: 272 10*3/uL (ref 150–400)
RBC: 3.91 MIL/uL — ABNORMAL LOW (ref 4.22–5.81)
RDW: 13.3 % (ref 11.5–15.5)
WBC: 6 10*3/uL (ref 4.0–10.5)
nRBC: 0 % (ref 0.0–0.2)

## 2022-07-23 LAB — COMPREHENSIVE METABOLIC PANEL
ALT: 14 U/L (ref 0–44)
AST: 20 U/L (ref 15–41)
Albumin: 3.7 g/dL (ref 3.5–5.0)
Alkaline Phosphatase: 83 U/L (ref 38–126)
Anion gap: 12 (ref 5–15)
BUN: 13 mg/dL (ref 6–20)
CO2: 24 mmol/L (ref 22–32)
Calcium: 9.3 mg/dL (ref 8.9–10.3)
Chloride: 101 mmol/L (ref 98–111)
Creatinine, Ser: 0.86 mg/dL (ref 0.61–1.24)
GFR, Estimated: >60 mL/min (ref >60)
Glucose, Bld: 256 mg/dL — ABNORMAL HIGH (ref 70–99)
Potassium: 3.4 mmol/L — ABNORMAL LOW (ref 3.5–5.1)
Sodium: 137 mmol/L (ref 135–145)
Total Bilirubin: 0.4 mg/dL (ref 0.3–1.2)
Total Protein: 7.1 g/dL (ref 6.5–8.1)

## 2022-07-23 LAB — ETHANOL: Alcohol, Ethyl (B): <10 mg/dL (ref <10)

## 2022-07-23 NOTE — ED Provider Notes (Signed)
   [facility] Provider Note    Event Date/Time   First MD Initiated Contact with Patient 07/23/22 1150     (approximate)   History   Addiction Problem   HPI  Henry Munoz is a 57 y.o. male with history of DVTs, diabetes compliant with his medications who presents with request with detox from crack cocaine.  Patient reports he has been addicted to crack cocaine for some time.  He has no physical complaints at this time.  Requesting detox assistance     Physical Exam   Triage Vital Signs: ED Triage Vitals  Enc Vitals Group     BP 07/23/22 1124 (!) 179/98     Pulse Rate 07/23/22 1124 86     Resp 07/23/22 1124 16     Temp 07/23/22 1124 98.5 F (36.9 C)     Temp Source 07/23/22 1124 Oral     SpO2 07/23/22 1124 96 %     Weight 07/23/22 1125 93 kg (205 lb)     Height 07/23/22 1125 1.702 m (5\' 7" )     Head Circumference --      Peak Flow --      Pain Score 07/23/22 1125 5     Pain Loc --      Pain Edu? --      Excl. in GC? --     Most recent vital signs: Vitals:   07/23/22 1124 07/23/22 1219  BP: (!) 179/98   Pulse: 86 85  Resp: 16 16  Temp: 98.5 F (36.9 C)   SpO2: 96% 98%     General: Awake, no distress.  CV:  Good peripheral perfusion.  Resp:  Normal effort.  Abd:  No distention.  Other:     ED Results / Procedures / Treatments   Labs (all labs ordered are listed, but only abnormal results are displayed) Labs Reviewed  COMPREHENSIVE METABOLIC PANEL - Abnormal; Notable for the following components:      Result Value   Potassium 3.4 (*)    Glucose, Bld 256 (*)    All other components within normal limits  CBC - Abnormal; Notable for the following components:   RBC 3.91 (*)    Hemoglobin 12.1 (*)    HCT 37.9 (*)    All other components within normal limits  ETHANOL  URINE DRUG SCREEN, QUALITATIVE ([facility] ONLY)     EKG     RADIOLOGY     PROCEDURES:  Critical Care performed:   Procedures   MEDICATIONS  ORDERED IN ED: Medications - No data to display   IMPRESSION / MDM / ASSESSMENT AND PLAN / ED COURSE  I reviewed the triage vital signs and the nursing notes. Patient's presentation is most consistent with exacerbation of chronic illness.   Patient with history of substance abuse, here for assistance with detox from cocaine.  He is well-appearing and in no acute distress, compliant with his medications for diabetes and DVTs.  No indication for admission, will refer to residential treatment services for detox  No psychiatric complaints at this time       FINAL CLINICAL IMPRESSION(S) / ED DIAGNOSES   Final diagnoses:  Drug dependence (HCC)     Rx / DC Orders   ED Discharge Orders     None        Note:  This document was prepared using Dragon voice recognition software and may include unintentional dictation errors.   Jene Every, MD 07/23/22 [phone]

## 2022-07-23 NOTE — ED Triage Notes (Signed)
Pt reports needing help getting off crack, seeking rehab or resources. Pt reports being type 2 diabetic and being on eliquis for prior blood clots in his lungs

## 2022-07-24 ENCOUNTER — Emergency Department
Admission: EM | Admit: 2022-07-24 | Discharge: 2022-07-24 | Disposition: A | Discharge status: Home / Self Care | Payer: No Typology Code available for payment source | Attending: Emergency Medicine | Admitting: Emergency Medicine

## 2022-07-24 ENCOUNTER — Other Ambulatory Visit: Payer: Self-pay

## 2022-07-24 DIAGNOSIS — F1410 Cocaine abuse, uncomplicated: Secondary | ICD-10-CM | POA: Insufficient documentation

## 2022-07-24 NOTE — ED Triage Notes (Signed)
Pt top ed from home via [employer]. Brother called 911 to have pt brought to ED bc he, the pt was "strung out on drugs and wasn't welcome in his home anymore". Pt is caox4, in no acute distress and ambulatory in triage. Pt admitts to doing crack cocaine and needs rehab. Pt denies SI/HI at this time. Pt was just assessed yesterday for labwork.

## 2022-07-24 NOTE — Discharge Instructions (Signed)
You have been seen in the Emergency Department (ED) today for substance abuse.  You have been evaluated by the ED physician(s) and/or by the behavioral medicine specialists and are being discharged with outpatient resources and follow up recommendations.  You must follow up yourself; we cannot make the appointments for you, and the [facility] hospitals (in [place] and in [place]) do not provide inpatient rehab for substance abuse.  Please return to the ED immediately if you have ANY thoughts of hurting yourself or anyone else, so that we may help you.  Please avoid alcohol and drug use.  Follow up with your doctor and/or therapist as soon as possible regarding today's ED  visit.   Please follow up any other recommendations and clinic appointments provided by the psychiatry team that saw you in the Emergency Department.

## 2022-07-24 NOTE — ED Notes (Signed)
[place] [employer] called to provide transport for pt.

## 2022-07-24 NOTE — ED Provider Notes (Signed)
[facility] Provider Note    Event Date/Time   First MD Initiated Contact with Patient 07/24/22 2258     (approximate)   History   Mental Health Problem (VOL)   HPI Henry Munoz is a 57 y.o. male well-known to the emergency department typically for cocaine abuse but also with prior psychiatric diagnoses.  He presents tonight voluntarily, brought in by Patent examiner.  He was at his brother's house and his brother got tired of the patient's cocaine abuse so he called the police and the police brought him here.  The patient is not saying that he is suicidal or wanting to kill anyone.  He is not reporting any hallucinations.  He says he is taking his medicine but he cannot stop smoking crack.  He said that he does not know where else to go or who else to call in spite of receiving resources each of the last times he has been here.     Physical Exam   Triage Vital Signs: ED Triage Vitals  Enc Vitals Group     BP 07/24/22 2215 (!) 179/104     Pulse Rate 07/24/22 2215 97     Resp 07/24/22 2215 16     Temp 07/24/22 2215 98.8 F (37.1 C)     Temp Source 07/24/22 2215 Oral     SpO2 07/24/22 2215 98 %     Weight 07/24/22 2216 92 kg (202 lb 13.2 oz)     Height 07/24/22 2216 1.702 m (5\' 7" )     Head Circumference --      Peak Flow --      Pain Score 07/24/22 2216 0     Pain Loc --      Pain Edu? --      Excl. in GC? --     Most recent vital signs: Vitals:   07/24/22 2215  BP: (!) 179/104  Pulse: 97  Resp: 16  Temp: 98.8 F (37.1 C)  SpO2: 98%    General: Awake, alert, no distress.  Calm and cooperative. CV:  Good peripheral perfusion.  Regular rate and rhythm. Resp:  Normal effort. Speaking easily and comfortably, no accessory muscle usage nor intercostal retractions.   Abd:  No distention.  Other:  Patient is calm and cooperative, reasonable, admitting that he has a problem with crack.  Does not report any hallucinations, suicidality, nor  homicidality.  He just said that his drug problem made his brother kicked him out of his house.   ED Results / Procedures / Treatments   Labs (all labs ordered are listed, but only abnormal results are displayed) Labs Reviewed - No data to display    IMPRESSION / MDM / ASSESSMENT AND PLAN / ED COURSE  I reviewed the triage vital signs and the nursing notes.                              Differential diagnosis includes, but is not limited to, cocaine abuse, secondary gain, malingering, social determinants of health.  Patient's presentation is most consistent with exacerbation of chronic illness.  Interventions/Medications given:  Medications - No data to display  (Note:  hospital course my include additional interventions and/or labs/studies not listed above.)   I verified in the medical record that the patient was here less than 36 hours ago and had reassuring lab work.  I also verified in the record that he has been  previously admitted to behavioral medicine services for schizophrenia but he is showing none of the signs or symptoms that he was showing at that time.  I read the H&P and the discharge summary written by Dr. Sherron Flemings with psychiatry and the patient was started on Abilify which seems to have worked to stabilize him from a psychiatric perspective.  At the time he was scheduled for a follow-up appointment with [facility], which he did not keep (that should have been 3 days ago).  When he was seen yesterday in the emergency department by Dr. Cyril Loosen, he was given information about residential treatment services (RTS), but he has not followed up.  I explained to him that the [facility] health policy is that we do not treat substance abuse nor seek placement for patients.  I explained that he needs to follow-up himself.  At this moment, he has the capacity to make his own decisions and he does not represent a danger to himself or others.  He does not meet IVC nor inpatient treatment criteria  and he understands that he cannot stay in the hospital nor the emergency department.  I provided him with resources including all of the local residential treatment facilities and outpatient treatment clinics for substance abuse as well as the name, number, address, and phone numbers for [facility] and [facility].         FINAL CLINICAL IMPRESSION(S) / ED DIAGNOSES   Final diagnoses:  Cocaine abuse (HCC)     Rx / DC Orders   ED Discharge Orders     None        Note:  This document was prepared using Dragon voice recognition software and may include unintentional dictation errors.   Henry Rose, MD 07/24/22 [phone]

## 2022-07-26 ENCOUNTER — Emergency Department
Admission: EM | Admit: 2022-07-26 | Discharge: 2022-07-26 | Disposition: A | Discharge status: Home / Self Care | Payer: Medicaid Other | Attending: Emergency Medicine | Admitting: Emergency Medicine

## 2022-07-26 ENCOUNTER — Encounter: Payer: Self-pay | Admitting: Emergency Medicine

## 2022-07-26 DIAGNOSIS — R531 Weakness: Secondary | ICD-10-CM | POA: Insufficient documentation

## 2022-07-26 DIAGNOSIS — Z8673 Personal history of transient ischemic attack (TIA), and cerebral infarction without residual deficits: Secondary | ICD-10-CM | POA: Insufficient documentation

## 2022-07-26 DIAGNOSIS — R29898 Other symptoms and signs involving the musculoskeletal system: Secondary | ICD-10-CM

## 2022-07-26 NOTE — ED Provider Notes (Signed)
[facility] Provider Note    Event Date/Time   First MD Initiated Contact with Patient 07/26/22 0401     (approximate)   History   Extremity Weakness   HPI  Caton Tefft is a 57 y.o. male well-known to the emergency department for frequent visits related to psychiatric illness, crack cocaine abuse, and malingering.  He presents tonight by EMS reporting weakness in his left leg.  He said that it has been a week since he had his stroke a while ago.  His leg gave out on him tonight.  His brother with whom he stays was brought to the hospital by EMS said the patient was home alone.  He then called EMS about his leg giving out and had them bring him here.  He was observed ambulating without difficulty.  He has no other complaints except that his brother is not home right now.     Physical Exam   Triage Vital Signs: ED Triage Vitals  Enc Vitals Group     BP 07/26/22 0051 135/87     Pulse Rate 07/26/22 0051 85     Resp 07/26/22 0051 15     Temp 07/26/22 0051 98.2 F (36.8 C)     Temp Source 07/26/22 0051 Oral     SpO2 07/26/22 0051 96 %     Weight 07/26/22 0047 92 kg (202 lb 13.2 oz)     Height 07/26/22 0047 1.702 m (5\' 7" )     Head Circumference --      Peak Flow --      Pain Score 07/26/22 0046 5     Pain Loc --      Pain Edu? --      Excl. in GC? --     Most recent vital signs: Vitals:   07/26/22 0051 07/26/22 0450  BP: 135/87 129/83  Pulse: 85 77  Resp: 15 17  Temp: 98.2 F (36.8 C) 98.1 F (36.7 C)  SpO2: 96% 97%     General: Awake, no distress.  Disheveled but at his baseline. CV:  Good peripheral perfusion.  Resp:  Normal effort.  Abd:  No distention.  Other:  Ambulatory without any obvious difficulty.   ED Results / Procedures / Treatments   Labs (all labs ordered are listed, but only abnormal results are displayed) Labs Reviewed - No data to display    PROCEDURES:  Critical Care performed:  No  Procedures   MEDICATIONS ORDERED IN ED: Medications - No data to display   IMPRESSION / MDM / ASSESSMENT AND PLAN / ED COURSE  I reviewed the triage vital signs and the nursing notes.                              Differential diagnosis includes, but is not limited to, malingering, secondary gain, chronic neurological deficits, acute on chronic deficit.  Patient's presentation is most consistent with exacerbation of chronic illness.  The patient admits he has no new findings but was concerned when his leg was weak and he was at home alone.  I explained to the patient that there are no changes from his prior exams said that he needs to go back home and wait for his brother who is currently hospitalized.  He has a safe place to stay.  The patient understands he does not have an acute medical issue at this time and is agreeable to discharge.  FINAL CLINICAL IMPRESSION(S) / ED DIAGNOSES   Final diagnoses:  Weakness of left leg     Rx / DC Orders   ED Discharge Orders     None        Note:  This document was prepared using Dragon voice recognition software and may include unintentional dictation errors.   [name], MD 07/26/22 1308

## 2022-07-26 NOTE — ED Triage Notes (Signed)
Pt arrived via [facility] from home with c/o Lt leg weakness and Lt hip pain. Pt reports symptoms have been consistent since CVA in May. Pt with left sided weakness noted post CVA. Pt denies new injury and denies recent falls.

## 2022-07-26 NOTE — Discharge Instructions (Signed)
You have had weakness since your stroke and nothing seems to change.  Please follow-up with your regular doctor at the next available opportunity.  Continue to avoid drugs and alcohol.

## 2022-07-28 ENCOUNTER — Other Ambulatory Visit: Payer: Self-pay

## 2022-07-28 ENCOUNTER — Emergency Department
Admission: EM | Admit: 2022-07-28 | Discharge: 2022-07-28 | Disposition: A | Discharge status: Home / Self Care | Payer: Medicaid Other | Attending: Emergency Medicine | Admitting: Emergency Medicine

## 2022-07-28 DIAGNOSIS — Z760 Encounter for issue of repeat prescription: Secondary | ICD-10-CM | POA: Insufficient documentation

## 2022-07-28 DIAGNOSIS — F1410 Cocaine abuse, uncomplicated: Secondary | ICD-10-CM | POA: Diagnosis not present

## 2022-07-28 DIAGNOSIS — E1165 Type 2 diabetes mellitus with hyperglycemia: Secondary | ICD-10-CM | POA: Diagnosis not present

## 2022-07-28 DIAGNOSIS — R739 Hyperglycemia, unspecified: Secondary | ICD-10-CM

## 2022-07-28 LAB — CBG MONITORING, ED: Glucose-Capillary: 212 mg/dL — ABNORMAL HIGH (ref 70–99)

## 2022-07-28 MED ORDER — PEN NEEDLES 31G X 5 MM MISC
1.0000 | Freq: Three times a day (TID) | 0 refills | Status: DC
Start: 2022-07-28 — End: 2023-02-26

## 2022-07-28 MED ORDER — INSULIN GLARGINE 100 UNIT/ML SOLOSTAR PEN
20.0000 [IU] | PEN_INJECTOR | Freq: Every day | SUBCUTANEOUS | 0 refills | Status: DC
Start: 2022-07-28 — End: 2023-02-26

## 2022-07-28 MED ORDER — INSULIN ASPART 100 UNIT/ML FLEXPEN
PEN_INJECTOR | SUBCUTANEOUS | 0 refills | Status: DC
Start: 2022-07-28 — End: 2023-02-26

## 2022-07-28 MED ORDER — LANCET DEVICE MISC: 1.0000 | Freq: Three times a day (TID) | 0 refills | Status: DC

## 2022-07-28 MED ORDER — BLOOD GLUCOSE TEST VI STRP
1.0000 | ORAL_STRIP | Freq: Three times a day (TID) | 0 refills | Status: DC
Start: 2022-07-28 — End: 2023-02-26

## 2022-07-28 MED ORDER — ACETAMINOPHEN 325 MG PO TABS
650.0000 mg | ORAL_TABLET | Freq: Once | ORAL | Status: AC
  Administered 2022-07-28: 650 mg via ORAL
  Filled 2022-07-28: qty 2

## 2022-07-28 MED ORDER — BLOOD GLUCOSE MONITORING SUPPL DEVI: 1.0000 | Freq: Three times a day (TID) | 0 refills | Status: DC

## 2022-07-28 NOTE — ED Provider Notes (Signed)
[facility] Provider Note    Event Date/Time   First MD Initiated Contact with Patient 07/28/22 1409     (approximate)   History   Medication Refill   HPI  Henry Munoz is a 57 y.o. male who is here with request for insulin.  Patient reports that he is visiting his brother and has been out of his insulin for the past 3 days.  He denies any abdominal pain, nausea, or vomiting.  He reports that he has a mild headache currently which is consistent with when he does not take his insulin.  He denies any visual changes.  He has not had any trouble walking or speaking.  Reports that he had a stroke and always has left-sided weakness that does not feel any different today than usual.  Patient Active Problem List   Diagnosis Date Noted   Auditory hallucinations 07/11/2022   Suicidal ideation 07/11/2022   Cocaine use 07/04/2022   Schizophrenia (HCC) 07/03/2022   PE (pulmonary thromboembolism) (HCC) 07/02/2022   Positive blood culture 06/30/2022   Cocaine abuse (HCC) 06/30/2022   Uncontrolled type 2 diabetes mellitus with hyperglycemia, with long-term current use of insulin (HCC) 06/30/2022   Pseudohyponatremia 06/30/2022   AKI (acute kidney injury) (HCC) 06/30/2022   Hyperkalemia 06/30/2022   IV infiltration, initial encounter 06/30/2022   Pulmonary embolus (HCC) 06/29/2022   Neuropathy, arm, left 10/15/2017   Acute encephalopathy 10/10/2017   Opiate abuse, episodic (HCC) 10/10/2017   MDD (major depressive disorder) 08/12/2016   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   Tobacco use disorder 08/10/2016   Cocaine use disorder, severe, dependence (HCC) 08/10/2016   Alcohol use disorder, moderate, dependence (HCC) 08/10/2016   Cerebrovascular accident (CVA) (HCC) 08/10/2016   Diabetes (HCC) 04/27/2016   Essential hypertension 02/19/2016          Physical Exam   Triage Vital Signs: ED Triage Vitals  Enc Vitals  Group     BP 07/28/22 1348 (!) 144/75     Pulse Rate 07/28/22 1347 81     Resp 07/28/22 1347 18     Temp 07/28/22 1347 98.7 F (37.1 C)     Temp src --      SpO2 07/28/22 1347 99 %     Weight 07/28/22 1347 205 lb (93 kg)     Height 07/28/22 1347 5\' 7"  (1.702 m)     Head Circumference --      Peak Flow --      Pain Score 07/28/22 1347 9     Pain Loc --      Pain Edu? --      Excl. in GC? --     Most recent vital signs: Vitals:   07/28/22 1347 07/28/22 1348  BP:  (!) 144/75  Pulse: 81   Resp: 18   Temp: 98.7 F (37.1 C)   SpO2: 99%     Physical Exam Vitals and nursing note reviewed.  Constitutional:      General: Awake and alert. No acute distress.    Appearance: Normal appearance. The patient is normal weight.  HENT:     Head: Normocephalic and atraumatic.     Mouth: Mucous membranes are moist.  Eyes:     General: PERRL. Normal EOMs        Right eye: No discharge.        Left eye: No discharge.     Conjunctiva/sclera: Conjunctivae normal.  Cardiovascular:     Rate  and Rhythm: Normal rate and regular rhythm.     Pulses: Normal pulses.     Heart sounds: Normal heart sounds Pulmonary:     Effort: Pulmonary effort is normal. No respiratory distress.     Breath sounds: Normal breath sounds.  Abdominal:     Abdomen is soft. There is no abdominal tenderness. No rebound or guarding. No distention. Musculoskeletal:        General: No swelling. Normal range of motion.     Cervical back: Normal range of motion and neck supple.  Skin:    General: Skin is warm and dry.     Capillary Refill: Capillary refill takes less than 2 seconds.     Findings: No rash.  Neurological:     Mental Status: The patient is awake and alert.   Neurological: GCS 15 alert and oriented x3 Normal speech, no expressive or receptive aphasia or dysarthria Cranial nerves II through XII intact Normal visual fields 5 out of 5 strength in all right-sided extremities with intact sensation  throughout, 4-5 strength in left upper and left lower extremity which is his baseline No extremity drift Normal finger-to-nose testing, no limb or truncal ataxia   ED Results / Procedures / Treatments   Labs (all labs ordered are listed, but only abnormal results are displayed) Labs Reviewed  CBG MONITORING, ED - Abnormal; Notable for the following components:      Result Value   Glucose-Capillary 212 (*)    All other components within normal limits     EKG     RADIOLOGY     PROCEDURES:  Critical Care performed:   Procedures   MEDICATIONS ORDERED IN ED: Medications  acetaminophen (TYLENOL) tablet 650 mg (650 mg Oral Given 07/28/22 1445)     IMPRESSION / MDM / ASSESSMENT AND PLAN / ED COURSE  I reviewed the triage vital signs and the nursing notes.   Differential diagnosis includes, but is not limited to, hyperglycemia, medication refill, tension headache, migraine.  Patient is awake and alert, hemodynamically stable and neurologically and neurovascularly intact.  I reviewed the patient's chart.  Patient has had multiple visits to this emergency department, this is his fourth visit this month.  Patient was most recently seen 2 days ago for weakness in his left leg.  Patient was seen on 6/15 for cocaine abuse.  Fingerstick obtained in triage demonstrates a blood sugar level of 212.  There is no nausea, vomiting, abdominal pain to suggest diabetic ketoacidosis.  Patient's insulin was refilled after I reviewed his chart and could see his prior insulin prescriptions.  Patient reports that he is very familiar with how to take this medicine.  We discussed strict return precautions and the importance of close outpatient follow-up.  He was given Tylenol for his headache with resolution of his headache.  His headache was gradual in onset, without history or physical exam findings to suggest encephalopathy; no altered mental status, fever or meningismus, vision changes,  vomiting or focal neurological deficit and improved with treatment in the emergency department. Therefore, I have low suspicion for concerning process that would require urgent or emergent imaging or diagnostic/therapeutic procedural intervention such as lumbar puncture. Doubt meningitis as there is no fever, photophobia, neck symptoms, altered mental status. Additionally the patient is not known to be immunocompromised. No history of trauma, doubt subdural or epidural hematoma. No dizziness or other neurologic symptoms so cerebellar infarction or other hemorrhagic stroke are unlikely. Intracranial mass unlikely given that the headache is not getting  progressively worse, is not worse in the morning, there are no other neurologic symptoms, and the neurologic exam is grossly at his baseline. Unlikely to be giant cell arteritis as there is no tenderness over temporal artery or vision changes. Doubt CO toxicity as no known exposure and no other family members have a headache. No neck pain and was not sudden onset or associated with movement of the neck and no dizziness,  doubt carotid artery dissection. No occipital tenderness so occipital neuralgia seems less likely. Return precautions discussed, patient to follow-up closely with outpatient provider.  Also recommended close outpatient follow-up for continued insulin refills.  Patient or stands and agrees with plan.  He was discharged in stable condition.   Patient's presentation is most consistent with exacerbation of chronic illness.      FINAL CLINICAL IMPRESSION(S) / ED DIAGNOSES   Final diagnoses:  Medication refill  Hyperglycemia     Rx / DC Orders   ED Discharge Orders          Ordered    Blood Glucose Monitoring Suppl DEVI  3 times daily        07/28/22 1432    Glucose Blood (BLOOD GLUCOSE TEST STRIPS) STRP  3 times daily        07/28/22 1432    insulin aspart (NOVOLOG) 100 UNIT/ML FlexPen        07/28/22 1432    insulin glargine  (LANTUS) 100 UNIT/ML Solostar Pen  Daily        07/28/22 1432    Insulin Pen Needle (PEN NEEDLES) 31G X 5 MM MISC  3 times daily        07/28/22 1432    Lancet Device MISC  3 times daily        07/28/22 1432             Note:  This document was prepared using Dragon voice recognition software and may include unintentional dictation errors.   Jackelyn Hoehn, PA-C 07/28/22 1456    Corena Herter, MD 07/28/22 1546

## 2022-07-28 NOTE — ED Notes (Signed)
First Nurse Note: Pt to ED via [facility] from home for headache. Pt states that he has not had insulin x 2 days because his power went out. Pt has hx/o CVA 3 weeks ago. Pt is in NAD.

## 2022-07-28 NOTE — ED Triage Notes (Signed)
Pt to ED [facility] from home, states he's here because he has been out of insulin for 3 days and his brother is here with the keys to the car so he cannot go to the doctors office.  Reports h/a from lack of insulin

## 2022-07-28 NOTE — Discharge Instructions (Signed)
Your insulin was refilled for you.  Please follow-up with your outpatient provider.  Please return for any new, worsening, or change in symptoms or other concerns.

## 2022-11-06 ENCOUNTER — Emergency Department: Payer: MEDICAID

## 2022-11-06 ENCOUNTER — Emergency Department
Admission: EM | Admit: 2022-11-06 | Discharge: 2022-11-06 | Disposition: A | Discharge status: Home / Self Care | Payer: MEDICAID | Attending: Emergency Medicine | Admitting: Emergency Medicine

## 2022-11-06 DIAGNOSIS — W07XXXA Fall from chair, initial encounter: Secondary | ICD-10-CM | POA: Insufficient documentation

## 2022-11-06 DIAGNOSIS — M5430 Sciatica, unspecified side: Secondary | ICD-10-CM | POA: Diagnosis not present

## 2022-11-06 DIAGNOSIS — S0990XA Unspecified injury of head, initial encounter: Secondary | ICD-10-CM | POA: Diagnosis present

## 2022-11-06 DIAGNOSIS — W19XXXA Unspecified fall, initial encounter: Secondary | ICD-10-CM

## 2022-11-06 LAB — CBC WITH DIFFERENTIAL/PLATELET
Abs Immature Granulocytes: 0.01 10*3/uL (ref 0.00–0.07)
Basophils Absolute: 0 10*3/uL (ref 0.0–0.1)
Basophils Relative: 0 %
Eosinophils Absolute: 0 10*3/uL (ref 0.0–0.5)
Eosinophils Relative: 1 %
HCT: 40.7 % (ref 39.0–52.0)
Hemoglobin: 13 g/dL (ref 13.0–17.0)
Immature Granulocytes: 0 %
Lymphocytes Relative: 42 %
Lymphs Abs: 2.1 10*3/uL (ref 0.7–4.0)
MCH: 29.9 pg (ref 26.0–34.0)
MCHC: 31.9 g/dL (ref 30.0–36.0)
MCV: 93.6 fL (ref 80.0–100.0)
Monocytes Absolute: 0.4 10*3/uL (ref 0.1–1.0)
Monocytes Relative: 8 %
Neutro Abs: 2.4 10*3/uL (ref 1.7–7.7)
Neutrophils Relative %: 49 %
Platelets: 202 10*3/uL (ref 150–400)
RBC: 4.35 MIL/uL (ref 4.22–5.81)
RDW: 13.4 % (ref 11.5–15.5)
WBC: 4.9 10*3/uL (ref 4.0–10.5)
nRBC: 0 % (ref 0.0–0.2)

## 2022-11-06 LAB — URINALYSIS, ROUTINE W REFLEX MICROSCOPIC
Bacteria, UA: NONE SEEN
Bilirubin Urine: NEGATIVE
Glucose, UA: >=500 mg/dL — AB
Hgb urine dipstick: NEGATIVE
Ketones, ur: NEGATIVE mg/dL
Leukocytes,Ua: NEGATIVE
Nitrite: NEGATIVE
Protein, ur: NEGATIVE mg/dL
Specific Gravity, Urine: 1.032 — ABNORMAL HIGH (ref 1.005–1.030)
pH: 7 (ref 5.0–8.0)

## 2022-11-06 LAB — BASIC METABOLIC PANEL
Anion gap: 10 (ref 5–15)
BUN: 12 mg/dL (ref 6–20)
CO2: 26 mmol/L (ref 22–32)
Calcium: 8.9 mg/dL (ref 8.9–10.3)
Chloride: 100 mmol/L (ref 98–111)
Creatinine, Ser: 0.9 mg/dL (ref 0.61–1.24)
GFR, Estimated: >60 mL/min (ref >60)
Glucose, Bld: 301 mg/dL — ABNORMAL HIGH (ref 70–99)
Potassium: 3.7 mmol/L (ref 3.5–5.1)
Sodium: 136 mmol/L (ref 135–145)

## 2022-11-06 MED ORDER — GABAPENTIN 300 MG PO CAPS
300.0000 mg | ORAL_CAPSULE | Freq: Three times a day (TID) | ORAL | 2 refills | Status: DC | PRN
  Filled 2022-11-06: qty 90, 30d supply, fill #0

## 2022-11-06 MED ORDER — GABAPENTIN 300 MG PO CAPS
300.0000 mg | ORAL_CAPSULE | Freq: Once | ORAL | Status: AC
  Administered 2022-11-06: 300 mg via ORAL
  Filled 2022-11-06: qty 1

## 2022-11-06 NOTE — ED Triage Notes (Signed)
Pt BIB EMS for a fall from standing position. No LOC. C/O lower back pain and L knee. L side weakness from prior CVA 07/2022. Non-compliance with meds due to financial reasons. Pt does take eliquis and compliant but no insulin in 3 months. CBG 340. Pt HTN.

## 2022-11-06 NOTE — Discharge Instructions (Addendum)
Please seek medical attention for any high fevers, chest pain, shortness of breath, change in behavior, persistent vomiting, bloody stool or any other new or concerning symptoms.  

## 2022-11-06 NOTE — ED Provider Notes (Signed)
[facility] Provider Note    Event Date/Time   First MD Initiated Contact with Patient 11/06/22 1853     (approximate)   History   Back pain   HPI  Henry Munoz is a 57 y.o. male who presents to the emergency department today with primary concern for back pain.  Patient is somewhat poor historian.  It does sound like he has been having issues with back pain for some time.  This pain does shoot down his leg.  He he states that this is related to when he had a stroke earlier this year.  However today he had an episode where the pain shot down his leg and it made him tripped over a chair and falling to the ground.  He does state he hit his head although he did not lose consciousness.  At the time of my exam he is complaining primarily of worsening back pain.  Denies any headache.  Denies any recent fevers or illness.     Physical Exam   Triage Vital Signs: ED Triage Vitals  Encounter Vitals Group     BP 11/06/22 1854 (!) 167/96     Systolic BP Percentile --      Diastolic BP Percentile --      Pulse Rate 11/06/22 1854 63     Resp 11/06/22 1854 16     Temp 11/06/22 1854 98.5 F (36.9 C)     Temp Source 11/06/22 1854 Oral     SpO2 11/06/22 1853 98 %     Weight 11/06/22 1853 205 lb (93 kg)     Height 11/06/22 1853 5\' 7"  (1.702 m)     Head Circumference --      Peak Flow --      Pain Score --      Pain Loc --      Pain Education --      Exclude from Growth Chart --     Most recent vital signs: Vitals:   11/06/22 1853 11/06/22 1854  BP:  (!) 167/96  Pulse:  63  Resp:  16  Temp:  98.5 F (36.9 C)  SpO2: 98% 100%   General: Awake, alert, oriented. CV:  Good peripheral perfusion. Regular rate and rhythm. Resp:  Normal effort. Lungs clear. Abd:  No distention.     ED Results / Procedures / Treatments   Labs (all labs ordered are listed, but only abnormal results are displayed) Labs Reviewed  BASIC METABOLIC PANEL - Abnormal; Notable  for the following components:      Result Value   Glucose, Bld 301 (*)    All other components within normal limits  URINALYSIS, ROUTINE W REFLEX MICROSCOPIC - Abnormal; Notable for the following components:   Color, Urine STRAW (*)    APPearance CLEAR (*)    Specific Gravity, Urine 1.032 (*)    Glucose, UA >=500 (*)    All other components within normal limits  CBC WITH DIFFERENTIAL/PLATELET     EKG  I, Phineas Semen, attending physician, personally viewed and interpreted this EKG  EKG Time: 1902 Rate: 64 Rhythm: sinus rhythm Axis: left axis deviation Intervals: qtc 413 QRS: narrow ST changes: no st elevation Impression: abnormal ekg   RADIOLOGY I independently interpreted and visualized the Lumbar spine. My interpretation: No fracture Radiology interpretation:  IMPRESSION:  Degenerative change without acute abnormality.    I independently interpreted and visualized the CT head/cervical spine. My interpretation: No intracranial bleed. No fracture Radiology  interpretation:  IMPRESSION:  1. No acute intracranial pathology.  2. Small-vessel white matter disease and unchanged encephalomalacia  of the posterior right MCA territory.  3. No fracture or static subluxation of the cervical spine.      PROCEDURES:  Critical Care performed: No   MEDICATIONS ORDERED IN ED: Medications - No data to display   IMPRESSION / MDM / ASSESSMENT AND PLAN / ED COURSE  I reviewed the triage vital signs and the nursing notes.                              Differential diagnosis includes, but is not limited to, fracture, contusion, intracranial bleed  Patient's presentation is most consistent with acute presentation with potential threat to life or bodily function.   The patient is on the cardiac monitor to evaluate for evidence of arrhythmia and/or significant heart rate changes.  patient presented to the emergency department today with primary complaint of low back pain.   Patient states he did hit his head.  Will check CT head and lumbar spine x-ray.  Additionally will check basic blood work given concern for elevated blood sugar.  Imaging without any acute fracture or bleed.  Blood work does show elevated sugar however no findings concerning for DKA.  Patient was given gabapentin which did help some of the pain.  At this time we will plan on discharging.  Will give prescription for gabapentin given concern for sciatica.   FINAL CLINICAL IMPRESSION(S) / ED DIAGNOSES   Final diagnoses:  Fall, initial encounter  Sciatica, unspecified laterality    Note:  This document was prepared using Dragon voice recognition software and may include unintentional dictation errors.    Phineas Semen, MD 11/06/22 2308

## 2022-11-07 ENCOUNTER — Other Ambulatory Visit: Payer: Self-pay

## 2022-11-08 ENCOUNTER — Other Ambulatory Visit: Payer: Self-pay

## 2022-11-30 ENCOUNTER — Ambulatory Visit: Payer: MEDICAID | Admitting: Gastroenterology

## 2023-01-18 ENCOUNTER — Ambulatory Visit: Payer: MEDICAID | Admitting: Gastroenterology

## 2023-01-27 ENCOUNTER — Emergency Department
Admission: EM | Admit: 2023-01-27 | Discharge: 2023-01-27 | Disposition: A | Discharge status: Home / Self Care | Payer: MEDICAID | Attending: Emergency Medicine | Admitting: Emergency Medicine

## 2023-01-27 ENCOUNTER — Other Ambulatory Visit: Payer: Self-pay

## 2023-01-27 ENCOUNTER — Encounter: Payer: Self-pay | Admitting: Emergency Medicine

## 2023-01-27 ENCOUNTER — Emergency Department: Payer: MEDICAID

## 2023-01-27 DIAGNOSIS — J441 Chronic obstructive pulmonary disease with (acute) exacerbation: Secondary | ICD-10-CM | POA: Insufficient documentation

## 2023-01-27 DIAGNOSIS — I10 Essential (primary) hypertension: Secondary | ICD-10-CM | POA: Insufficient documentation

## 2023-01-27 DIAGNOSIS — Z720 Tobacco use: Secondary | ICD-10-CM | POA: Diagnosis not present

## 2023-01-27 DIAGNOSIS — E1165 Type 2 diabetes mellitus with hyperglycemia: Secondary | ICD-10-CM | POA: Insufficient documentation

## 2023-01-27 DIAGNOSIS — J101 Influenza due to other identified influenza virus with other respiratory manifestations: Secondary | ICD-10-CM | POA: Insufficient documentation

## 2023-01-27 DIAGNOSIS — R0602 Shortness of breath: Secondary | ICD-10-CM | POA: Diagnosis present

## 2023-01-27 DIAGNOSIS — Z20822 Contact with and (suspected) exposure to covid-19: Secondary | ICD-10-CM | POA: Diagnosis not present

## 2023-01-27 DIAGNOSIS — J111 Influenza due to unidentified influenza virus with other respiratory manifestations: Secondary | ICD-10-CM

## 2023-01-27 LAB — BASIC METABOLIC PANEL
Anion gap: 10 (ref 5–15)
BUN: 10 mg/dL (ref 6–20)
CO2: 23 mmol/L (ref 22–32)
Calcium: 8.9 mg/dL (ref 8.9–10.3)
Chloride: 100 mmol/L (ref 98–111)
Creatinine, Ser: 0.9 mg/dL (ref 0.61–1.24)
GFR, Estimated: >60 mL/min (ref >60)
Glucose, Bld: 284 mg/dL — ABNORMAL HIGH (ref 70–99)
Potassium: 4 mmol/L (ref 3.5–5.1)
Sodium: 133 mmol/L — ABNORMAL LOW (ref 135–145)

## 2023-01-27 LAB — CBC
HCT: 41.9 % (ref 39.0–52.0)
Hemoglobin: 13.6 g/dL (ref 13.0–17.0)
MCH: 30.8 pg (ref 26.0–34.0)
MCHC: 32.5 g/dL (ref 30.0–36.0)
MCV: 94.8 fL (ref 80.0–100.0)
Platelets: 188 10*3/uL (ref 150–400)
RBC: 4.42 MIL/uL (ref 4.22–5.81)
RDW: 13.1 % (ref 11.5–15.5)
WBC: 6 10*3/uL (ref 4.0–10.5)
nRBC: 0 % (ref 0.0–0.2)

## 2023-01-27 LAB — RESP PANEL BY RT-PCR (RSV, FLU A&B, COVID)  RVPGX2
Influenza A by PCR: POSITIVE — AB
Influenza B by PCR: NEGATIVE
Resp Syncytial Virus by PCR: NEGATIVE
SARS Coronavirus 2 by RT PCR: NEGATIVE

## 2023-01-27 MED ORDER — ACETAMINOPHEN 325 MG PO TABS
650.0000 mg | ORAL_TABLET | Freq: Once | ORAL | Status: AC
  Administered 2023-01-27: 650 mg via ORAL
  Filled 2023-01-27: qty 2

## 2023-01-27 MED ORDER — IPRATROPIUM-ALBUTEROL 0.5-2.5 (3) MG/3ML IN SOLN
6.0000 mL | Freq: Once | RESPIRATORY_TRACT | Status: AC
  Administered 2023-01-27: 6 mL via RESPIRATORY_TRACT
  Filled 2023-01-27: qty 6

## 2023-01-27 MED ORDER — OSELTAMIVIR PHOSPHATE 75 MG PO CAPS
75.0000 mg | ORAL_CAPSULE | Freq: Two times a day (BID) | ORAL | 0 refills | Status: AC
Start: 2023-01-27 — End: 2023-02-02
  Filled 2023-01-28: qty 10, 5d supply, fill #0

## 2023-01-27 MED ORDER — ALBUTEROL SULFATE HFA 108 (90 BASE) MCG/ACT IN AERS
2.0000 | INHALATION_SPRAY | RESPIRATORY_TRACT | 2 refills | Status: AC | PRN
Start: 2023-01-27 — End: ?
  Filled 2023-01-28: qty 18, 16d supply, fill #0

## 2023-01-27 MED ORDER — AEROCHAMBER PLUS FLO-VU MEDIUM MISC
1.0000 | Freq: Once | Status: AC
  Administered 2023-01-27: 1
  Filled 2023-01-27 (×2): qty 1

## 2023-01-27 MED ORDER — PREDNISONE 20 MG PO TABS
60.0000 mg | ORAL_TABLET | Freq: Once | ORAL | Status: AC
  Administered 2023-01-27: 60 mg via ORAL
  Filled 2023-01-27: qty 3

## 2023-01-27 MED ORDER — PREDNISONE 50 MG PO TABS
50.0000 mg | ORAL_TABLET | Freq: Every day | ORAL | 0 refills | Status: AC
  Filled 2023-01-28: qty 4, 4d supply, fill #0

## 2023-01-27 NOTE — ED Provider Notes (Signed)
[facility] Provider Note    Event Date/Time   First MD Initiated Contact with Patient 01/27/23 1148     (approximate)   History   Shortness of Breath   HPI  Henry Munoz is a 57 y.o. male past medical history significant for schizophrenia, diabetes, hypertension, tobacco use, history of COPD, presents to the emergency department shortness of breath and cough.  Patient states that he started feeling poorly starting yesterday.  States that his whole body hurts and he is having aches and pains.  Complaining of a significant cough and shortness of breath.  Denies significant chest pain and only having chest pain with coughing.  Feels like he cannot get a good breath.  No recent hospitalization.  Denies nausea or vomiting.  No significant diarrhea.     Physical Exam   Triage Vital Signs: ED Triage Vitals  Encounter Vitals Group     BP 01/27/23 1123 (!) 165/94     Systolic BP Percentile --      Diastolic BP Percentile --      Pulse Rate 01/27/23 1123 96     Resp 01/27/23 1123 18     Temp 01/27/23 1123 100.3 F (37.9 C)     Temp Source 01/27/23 1123 Oral     SpO2 01/27/23 1123 91 %     Weight 01/27/23 1122 205 lb (93 kg)     Height 01/27/23 1122 5\' 7"  (1.702 m)     Head Circumference --      Peak Flow --      Pain Score 01/27/23 1122 0     Pain Loc --      Pain Education --      Exclude from Growth Chart --     Most recent vital signs: Vitals:   01/27/23 1123  BP: (!) 165/94  Pulse: 96  Resp: 18  Temp: 100.3 F (37.9 C)  SpO2: 91%    Physical Exam Constitutional:      Appearance: He is well-developed.  HENT:     Head: Atraumatic.  Eyes:     Conjunctiva/sclera: Conjunctivae normal.     Pupils: Pupils are equal, round, and reactive to light.  Cardiovascular:     Rate and Rhythm: Regular rhythm.  Pulmonary:     Effort: Tachypnea and respiratory distress present.     Breath sounds: Wheezing present.  Musculoskeletal:      Cervical back: Normal range of motion.     Right lower leg: No edema.     Left lower leg: No edema.  Skin:    General: Skin is warm.     Capillary Refill: Capillary refill takes less than 2 seconds.  Neurological:     General: No focal deficit present.     Mental Status: He is alert. Mental status is at baseline.     IMPRESSION / MDM / ASSESSMENT AND PLAN / ED COURSE  I reviewed the triage vital signs and the nursing notes.  Differential diagnosis including COPD exacerbation, pneumonia, anemia, viral illness including COVID/influenza  EKG  I, Corena Herter, the attending physician, personally viewed and interpreted this ECG.  EKG with significant artifact and wandering baseline.  Normal sinus rhythm.  Normal intervals.  Nonspecific ST changes.  No findings of acute ischemia  RADIOLOGY I independently reviewed imaging, my interpretation of imaging: Chest x-ray without focal findings consistent with pneumonia.  Chest x-ray currently pending read  LABS (all labs ordered are listed, but only abnormal results are  displayed) Labs interpreted as -    Labs Reviewed  RESP PANEL BY RT-PCR (RSV, FLU A&B, COVID)  RVPGX2 - Abnormal; Notable for the following components:      Result Value   Influenza A by PCR POSITIVE (*)    All other components within normal limits  BASIC METABOLIC PANEL - Abnormal; Notable for the following components:   Sodium 133 (*)    Glucose, Bld 284 (*)    All other components within normal limits  CBC     MDM    Patient without leukocytosis.  Hyperglycemia but does not meet criteria for DKA.  No significant anemia on exam.  Positive for influenza A.  Significant wheezing on exam and speaking in 2 word sentences.  Given DuoNeb treatments and p.o. prednisone.  Plan to reevaluate after nebulizer treatment.  Will get an ambulatory oxygen saturation.  Ambulatory pulse ox at 95%.  States he is feeling much better after breathing treatment.  Does not have  albuterol at home.  Given a spacer in the emergency department and given a prescription for albuterol.  Will start on prednisone and discussed checking glucose frequently and using his sliding scale insulin.  Will start on Tamiflu given his otherwise risk factors for his influenza.  Given return precautions for any worsening symptoms and discussed close follow-up and calling his primary care physician today.   PROCEDURES:  Critical Care performed: No  Procedures  Patient's presentation is most consistent with acute presentation with potential threat to life or bodily function.   MEDICATIONS ORDERED IN ED: Medications  AeroChamber Plus Flo-Vu Large MISC 1 each (has no administration in time range)  ipratropium-albuterol (DUONEB) 0.5-2.5 (3) MG/3ML nebulizer solution 6 mL (6 mLs Nebulization Given 01/27/23 1243)  predniSONE (DELTASONE) tablet 60 mg (60 mg Oral Given 01/27/23 1243)  acetaminophen (TYLENOL) tablet 650 mg (650 mg Oral Given 01/27/23 1336)    FINAL CLINICAL IMPRESSION(S) / ED DIAGNOSES   Final diagnoses:  COPD exacerbation (HCC)  SOB (shortness of breath)  Influenza     Rx / DC Orders   ED Discharge Orders          Ordered    oseltamivir (TAMIFLU) 75 MG capsule  2 times daily        01/27/23 1355    albuterol (VENTOLIN HFA) 108 (90 Base) MCG/ACT inhaler  Every 4 hours PRN        01/27/23 1355    predniSONE (DELTASONE) 50 MG tablet  Daily with breakfast        01/27/23 1355             Note:  This document was prepared using Dragon voice recognition software and may include unintentional dictation errors.   Corena Herter, MD 01/27/23 1356

## 2023-01-27 NOTE — ED Triage Notes (Signed)
First Nurse Note;  Pt via [facility] from home. Pt c/o SOB, EMS report subtle wheezing. Denies hx of COPD/CHF. Pt had a recent stroke with L sided deficits. Pt is A&Ox4 and NAD 160/102 BP 95% on RA 101.2 orally  319 CBG

## 2023-01-27 NOTE — ED Notes (Signed)
See triage note  Presents with low grade temp and some SOB   States he started feeling bad yesterday  Became worse this am Audible wheezing noted

## 2023-01-27 NOTE — ED Triage Notes (Signed)
Patient to ED via [facility] from home for SOB. Started yesterday. Speaking in full sentences without difficulty. Denies respiratory hx.

## 2023-01-27 NOTE — Discharge Instructions (Addendum)
You were seen in the emergency department for shortness of breath and cough.  You tested positive for influenza in the emergency department.  Concerned that you are having a COPD exacerbation from your influenza.  You were given a nebulizer treatment in the emergency department and your first dose of steroid.  Use 4 puffs of albuterol every 2 hours with your spacer for shortness of breath and wheezing.  You are given a prescription for Tamiflu and a steroid.  Call your primary care physician today to schedule close follow-up appointment this next week to make sure you are improving.  Return to the emergency department if your shortness of breath worsens or if you have signs of dehydration.  Prednisone -you are given a prescription for a steroid.  It is important that you take this medication with food.  This medication can cause an upset stomach.  It also can increase your glucose if you have a history of diabetes, so it is important that you check your glucose frequently while you are on this medication.  Thank you for choosing [place] for your health care, it was my pleasure to care for you today!  Corena Herter, MD

## 2023-01-27 NOTE — ED Notes (Signed)
Up to bathroom   Ambulates with diff  slight wheezing still noted

## 2023-01-28 ENCOUNTER — Other Ambulatory Visit: Payer: Self-pay

## 2023-02-15 ENCOUNTER — Other Ambulatory Visit: Payer: Self-pay

## 2023-02-21 ENCOUNTER — Emergency Department: Payer: MEDICAID

## 2023-02-21 ENCOUNTER — Other Ambulatory Visit: Payer: Self-pay

## 2023-02-21 ENCOUNTER — Inpatient Hospital Stay
Admission: EM | Admit: 2023-02-21 | Discharge: 2023-02-22 | DRG: 189 | Discharge status: Against Medical Advice | Payer: MEDICAID | Attending: Internal Medicine | Admitting: Internal Medicine

## 2023-02-21 DIAGNOSIS — R7989 Other specified abnormal findings of blood chemistry: Secondary | ICD-10-CM | POA: Diagnosis present

## 2023-02-21 DIAGNOSIS — Z7901 Long term (current) use of anticoagulants: Secondary | ICD-10-CM

## 2023-02-21 DIAGNOSIS — Z7951 Long term (current) use of inhaled steroids: Secondary | ICD-10-CM

## 2023-02-21 DIAGNOSIS — Z833 Family history of diabetes mellitus: Secondary | ICD-10-CM

## 2023-02-21 DIAGNOSIS — Z8249 Family history of ischemic heart disease and other diseases of the circulatory system: Secondary | ICD-10-CM

## 2023-02-21 DIAGNOSIS — Z888 Allergy status to other drugs, medicaments and biological substances status: Secondary | ICD-10-CM

## 2023-02-21 DIAGNOSIS — F259 Schizoaffective disorder, unspecified: Secondary | ICD-10-CM | POA: Diagnosis present

## 2023-02-21 DIAGNOSIS — J9601 Acute respiratory failure with hypoxia: Principal | ICD-10-CM

## 2023-02-21 DIAGNOSIS — J9602 Acute respiratory failure with hypercapnia: Principal | ICD-10-CM | POA: Diagnosis present

## 2023-02-21 DIAGNOSIS — Z5329 Procedure and treatment not carried out because of patient's decision for other reasons: Secondary | ICD-10-CM | POA: Diagnosis present

## 2023-02-21 DIAGNOSIS — I10 Essential (primary) hypertension: Secondary | ICD-10-CM | POA: Diagnosis present

## 2023-02-21 DIAGNOSIS — E785 Hyperlipidemia, unspecified: Secondary | ICD-10-CM

## 2023-02-21 DIAGNOSIS — E1165 Type 2 diabetes mellitus with hyperglycemia: Secondary | ICD-10-CM

## 2023-02-21 DIAGNOSIS — Z79899 Other long term (current) drug therapy: Secondary | ICD-10-CM

## 2023-02-21 DIAGNOSIS — J441 Chronic obstructive pulmonary disease with (acute) exacerbation: Secondary | ICD-10-CM | POA: Diagnosis present

## 2023-02-21 DIAGNOSIS — E1140 Type 2 diabetes mellitus with diabetic neuropathy, unspecified: Secondary | ICD-10-CM | POA: Insufficient documentation

## 2023-02-21 DIAGNOSIS — Z1152 Encounter for screening for COVID-19: Secondary | ICD-10-CM

## 2023-02-21 DIAGNOSIS — F1910 Other psychoactive substance abuse, uncomplicated: Secondary | ICD-10-CM | POA: Diagnosis not present

## 2023-02-21 DIAGNOSIS — Z8673 Personal history of transient ischemic attack (TIA), and cerebral infarction without residual deficits: Secondary | ICD-10-CM

## 2023-02-21 DIAGNOSIS — Z86711 Personal history of pulmonary embolism: Secondary | ICD-10-CM | POA: Diagnosis not present

## 2023-02-21 DIAGNOSIS — F17210 Nicotine dependence, cigarettes, uncomplicated: Secondary | ICD-10-CM | POA: Diagnosis present

## 2023-02-21 DIAGNOSIS — Z7902 Long term (current) use of antithrombotics/antiplatelets: Secondary | ICD-10-CM

## 2023-02-21 DIAGNOSIS — Z841 Family history of disorders of kidney and ureter: Secondary | ICD-10-CM | POA: Diagnosis not present

## 2023-02-21 DIAGNOSIS — I110 Hypertensive heart disease with heart failure: Secondary | ICD-10-CM | POA: Diagnosis present

## 2023-02-21 DIAGNOSIS — I5032 Chronic diastolic (congestive) heart failure: Secondary | ICD-10-CM | POA: Diagnosis not present

## 2023-02-21 DIAGNOSIS — Z794 Long term (current) use of insulin: Secondary | ICD-10-CM

## 2023-02-21 LAB — BLOOD GAS, VENOUS
Acid-Base Excess: 3.3 mmol/L — ABNORMAL HIGH (ref 0.0–2.0)
Bicarbonate: 29.1 mmol/L — ABNORMAL HIGH (ref 20.0–28.0)
O2 Saturation: 98.9 %
Patient temperature: 37
pCO2, Ven: 48 mm[Hg] (ref 44–60)
pH, Ven: 7.39 (ref 7.25–7.43)
pO2, Ven: 100 mm[Hg] — ABNORMAL HIGH (ref 32–45)

## 2023-02-21 LAB — COMPREHENSIVE METABOLIC PANEL
ALT: 20 U/L (ref 0–44)
AST: 20 U/L (ref 15–41)
Albumin: 3.7 g/dL (ref 3.5–5.0)
Alkaline Phosphatase: 106 U/L (ref 38–126)
Anion gap: 14 (ref 5–15)
BUN: 14 mg/dL (ref 6–20)
CO2: 25 mmol/L (ref 22–32)
Calcium: 8.9 mg/dL (ref 8.9–10.3)
Chloride: 97 mmol/L — ABNORMAL LOW (ref 98–111)
Creatinine, Ser: 1.04 mg/dL (ref 0.61–1.24)
GFR, Estimated: >60 mL/min (ref >60)
Glucose, Bld: 350 mg/dL — ABNORMAL HIGH (ref 70–99)
Potassium: 3.9 mmol/L (ref 3.5–5.1)
Sodium: 136 mmol/L (ref 135–145)
Total Bilirubin: 1 mg/dL (ref 0.0–1.2)
Total Protein: 7.4 g/dL (ref 6.5–8.1)

## 2023-02-21 LAB — CBC WITH DIFFERENTIAL/PLATELET
Abs Immature Granulocytes: 0.03 10*3/uL (ref 0.00–0.07)
Basophils Absolute: 0 10*3/uL (ref 0.0–0.1)
Basophils Relative: 0 %
Eosinophils Absolute: 0 10*3/uL (ref 0.0–0.5)
Eosinophils Relative: 0 %
HCT: 46.2 % (ref 39.0–52.0)
Hemoglobin: 14.7 g/dL (ref 13.0–17.0)
Immature Granulocytes: 0 %
Lymphocytes Relative: 21 %
Lymphs Abs: 1.4 10*3/uL (ref 0.7–4.0)
MCH: 31.1 pg (ref 26.0–34.0)
MCHC: 31.8 g/dL (ref 30.0–36.0)
MCV: 97.9 fL (ref 80.0–100.0)
Monocytes Absolute: 0.7 10*3/uL (ref 0.1–1.0)
Monocytes Relative: 10 %
Neutro Abs: 4.6 10*3/uL (ref 1.7–7.7)
Neutrophils Relative %: 69 %
Platelets: 205 10*3/uL (ref 150–400)
RBC: 4.72 MIL/uL (ref 4.22–5.81)
RDW: 13.6 % (ref 11.5–15.5)
WBC: 6.9 10*3/uL (ref 4.0–10.5)
nRBC: 0 % (ref 0.0–0.2)

## 2023-02-21 LAB — RESP PANEL BY RT-PCR (RSV, FLU A&B, COVID)  RVPGX2
Influenza A by PCR: NEGATIVE
Influenza B by PCR: NEGATIVE
Resp Syncytial Virus by PCR: POSITIVE — AB
SARS Coronavirus 2 by RT PCR: NEGATIVE

## 2023-02-21 LAB — LACTIC ACID, PLASMA
Lactic Acid, Venous: 1.9 mmol/L (ref 0.5–1.9)
Lactic Acid, Venous: 1.1 mmol/L (ref 0.5–1.9)

## 2023-02-21 LAB — TROPONIN I (HIGH SENSITIVITY)
Troponin I (High Sensitivity): 27 ng/L — ABNORMAL HIGH (ref <18)
Troponin I (High Sensitivity): 27 ng/L — ABNORMAL HIGH (ref <18)

## 2023-02-21 LAB — BRAIN NATRIURETIC PEPTIDE: B Natriuretic Peptide: 387.4 pg/mL — ABNORMAL HIGH (ref 0.0–100.0)

## 2023-02-21 MED ORDER — AMLODIPINE BESYLATE 5 MG PO TABS
10.0000 mg | ORAL_TABLET | Freq: Every day | ORAL | Status: DC
  Administered 2023-02-22: 10 mg via ORAL
  Filled 2023-02-21: qty 2

## 2023-02-21 MED ORDER — IPRATROPIUM-ALBUTEROL 0.5-2.5 (3) MG/3ML IN SOLN
3.0000 mL | Freq: Once | RESPIRATORY_TRACT | Status: AC
  Administered 2023-02-21: 3 mL via RESPIRATORY_TRACT
  Filled 2023-02-21: qty 3

## 2023-02-21 MED ORDER — SODIUM CHLORIDE 0.9 % IV SOLN
500.0000 mg | Freq: Once | INTRAVENOUS | Status: AC
  Administered 2023-02-21: 500 mg via INTRAVENOUS
  Filled 2023-02-21: qty 5

## 2023-02-21 MED ORDER — ONDANSETRON HCL 4 MG PO TABS: 4.0000 mg | ORAL_TABLET | Freq: Four times a day (QID) | ORAL | Status: DC | PRN

## 2023-02-21 MED ORDER — SODIUM CHLORIDE 0.9 % IV SOLN: INTRAVENOUS | Status: DC

## 2023-02-21 MED ORDER — NICOTINE 14 MG/24HR TD PT24
14.0000 mg | MEDICATED_PATCH | Freq: Every day | TRANSDERMAL | Status: DC
Start: 2023-02-22 — End: 2023-02-22
  Administered 2023-02-22: 14 mg via TRANSDERMAL
  Filled 2023-02-21: qty 1

## 2023-02-21 MED ORDER — GUAIFENESIN ER 600 MG PO TB12
600.0000 mg | ORAL_TABLET | Freq: Two times a day (BID) | ORAL | Status: DC
  Administered 2023-02-21 – 2023-02-22 (×2): 600 mg via ORAL
  Filled 2023-02-21 (×2): qty 1

## 2023-02-21 MED ORDER — ONDANSETRON HCL 4 MG/2ML IJ SOLN: 4.0000 mg | Freq: Four times a day (QID) | INTRAMUSCULAR | Status: DC | PRN

## 2023-02-21 MED ORDER — HYDROCOD POLI-CHLORPHE POLI ER 10-8 MG/5ML PO SUER
5.0000 mL | Freq: Two times a day (BID) | ORAL | Status: DC | PRN
  Administered 2023-02-21: 5 mL via ORAL
  Filled 2023-02-21: qty 5

## 2023-02-21 MED ORDER — ACETAMINOPHEN 325 MG RE SUPP: 650.0000 mg | Freq: Four times a day (QID) | RECTAL | Status: DC | PRN

## 2023-02-21 MED ORDER — PREDNISONE 20 MG PO TABS: 40.0000 mg | ORAL_TABLET | Freq: Every day | ORAL | Status: DC

## 2023-02-21 MED ORDER — GABAPENTIN 300 MG PO CAPS
300.0000 mg | ORAL_CAPSULE | Freq: Three times a day (TID) | ORAL | Status: DC | PRN
Start: 2023-02-21 — End: 2023-02-22

## 2023-02-21 MED ORDER — INSULIN GLARGINE-YFGN 100 UNIT/ML ~~LOC~~ SOLN
20.0000 [IU] | Freq: Every day | SUBCUTANEOUS | Status: DC
  Administered 2023-02-22: 20 [IU] via SUBCUTANEOUS
  Filled 2023-02-21: qty 0.2

## 2023-02-21 MED ORDER — APIXABAN 5 MG PO TABS
5.0000 mg | ORAL_TABLET | Freq: Two times a day (BID) | ORAL | Status: DC
Start: 2023-02-22 — End: 2023-02-22
  Administered 2023-02-22 (×2): 5 mg via ORAL
  Filled 2023-02-21 (×2): qty 1

## 2023-02-21 MED ORDER — METHYLPREDNISOLONE SODIUM SUCC 40 MG IJ SOLR
40.0000 mg | Freq: Two times a day (BID) | INTRAMUSCULAR | Status: DC
  Administered 2023-02-22: 40 mg via INTRAVENOUS
  Filled 2023-02-21: qty 1

## 2023-02-21 MED ORDER — ATORVASTATIN CALCIUM 20 MG PO TABS
40.0000 mg | ORAL_TABLET | Freq: Every day | ORAL | Status: DC
  Administered 2023-02-22: 40 mg via ORAL
  Filled 2023-02-21: qty 2

## 2023-02-21 MED ORDER — IPRATROPIUM-ALBUTEROL 0.5-2.5 (3) MG/3ML IN SOLN
3.0000 mL | Freq: Four times a day (QID) | RESPIRATORY_TRACT | Status: DC
  Administered 2023-02-22 (×3): 3 mL via RESPIRATORY_TRACT
  Filled 2023-02-21 (×3): qty 3

## 2023-02-21 MED ORDER — SODIUM CHLORIDE 0.9 % IV SOLN
1.0000 g | INTRAVENOUS | Status: DC
  Administered 2023-02-21: 1 g via INTRAVENOUS
  Filled 2023-02-21: qty 10

## 2023-02-21 MED ORDER — HYDROXYZINE HCL 25 MG PO TABS
25.0000 mg | ORAL_TABLET | Freq: Three times a day (TID) | ORAL | Status: DC | PRN
  Administered 2023-02-21: 25 mg via ORAL
  Filled 2023-02-21: qty 1

## 2023-02-21 MED ORDER — MAGNESIUM HYDROXIDE 400 MG/5ML PO SUSP: 30.0000 mL | Freq: Every day | ORAL | Status: DC | PRN

## 2023-02-21 MED ORDER — SODIUM CHLORIDE 0.9 % IV SOLN: 2.0000 g | Freq: Once | INTRAVENOUS | Status: DC

## 2023-02-21 MED ORDER — ACETAMINOPHEN 325 MG PO TABS: 650.0000 mg | ORAL_TABLET | Freq: Four times a day (QID) | ORAL | Status: DC | PRN

## 2023-02-21 MED ORDER — TRAZODONE HCL 50 MG PO TABS
50.0000 mg | ORAL_TABLET | Freq: Every evening | ORAL | Status: DC | PRN
  Administered 2023-02-21: 50 mg via ORAL
  Filled 2023-02-21: qty 1

## 2023-02-21 MED ORDER — ARIPIPRAZOLE 15 MG PO TABS
15.0000 mg | ORAL_TABLET | Freq: Every day | ORAL | Status: DC
Start: 2023-02-22 — End: 2023-02-22
  Administered 2023-02-22: 15 mg via ORAL
  Filled 2023-02-21: qty 1

## 2023-02-21 NOTE — Assessment & Plan Note (Signed)
-   We will continue his Plavix and statin therapy.

## 2023-02-21 NOTE — ED Notes (Signed)
 Lab confirma has all recently ordered specimen and will begin testing ordered

## 2023-02-21 NOTE — Assessment & Plan Note (Addendum)
-   We will place the patient IV steroid therapy with IV Solu-Medrol as well as nebulized bronchodilator therapy with duonebs q.i.d. and q.4 hours p.r.n.Marland Kitchen - Mucolytic therapy will be provided with Mucinex and antibiotic therapy with IV Rocephin.

## 2023-02-21 NOTE — Assessment & Plan Note (Signed)
 -  We will continue Neurontin. ?

## 2023-02-21 NOTE — Assessment & Plan Note (Signed)
 -  We will continue statin therapy.

## 2023-02-21 NOTE — Assessment & Plan Note (Signed)
-   The patient will be placed on supplemental coverage with NovoLog with frequent fingerstick blood glucose measures especially given steroid therapy. - We will continue basal coverage. - This could be partly related to noncompliance.

## 2023-02-21 NOTE — Assessment & Plan Note (Addendum)
-   The patient will be admitted to a progressive unit bed. - We will continue on BiPAP admission-O2 protocol performed. - This is likely secondary to COPD exacerbation. - Management otherwise as below. - Given elevated BNP will obtain a D-dimer level and if elevated chest CTA to rule out PE.SABRA

## 2023-02-21 NOTE — Assessment & Plan Note (Signed)
-   We will continue antihypertensive therapy.

## 2023-02-21 NOTE — ED Provider Notes (Signed)
 [facility] Provider Note    Event Date/Time   First MD Initiated Contact with Patient 02/21/23 1925     (approximate)   History   Respiratory Distress   HPI  Henry Munoz is a 58 y.o. male history of COPD does not wear oxygen at home presents to the ER for evaluation of acute shortness of breath as well as productive phlegm over the past 24 to 48 hours.  Found by EMS found to be hypoxic was given Solu-Medrol , nebulizer treatment on 2.  He was hypoxic to the low 80s.  Placed on nasal cannula.     Physical Exam   Triage Vital Signs: ED Triage Vitals  Encounter Vitals Group     BP 02/21/23 1933 (!) 169/100     Systolic BP Percentile --      Diastolic BP Percentile --      Pulse Rate 02/21/23 1926 (!) 115     Resp 02/21/23 1926 (!) 28     Temp 02/21/23 1926 99.9 F (37.7 C)     Temp Source 02/21/23 1926 Oral     SpO2 02/21/23 1926 (!) 85 %     Weight 02/21/23 1924 260 lb (117.9 kg)     Height --      Head Circumference --      Peak Flow --      Pain Score --      Pain Loc --      Pain Education --      Exclude from Growth Chart --     Most recent vital signs: Vitals:   02/21/23 1935 02/21/23 2015  BP: (!) 169/100   Pulse: (!) 113 (!) 110  Resp: (!) 29 (!) 32  Temp:    SpO2: 92% 91%     Constitutional: Alert  Eyes: Conjunctivae are normal.  Head: Atraumatic. Nose: No congestion/rhinnorhea. Mouth/Throat: Mucous membranes are moist.   Neck: Painless ROM.  Cardiovascular:   Good peripheral circulation. Respiratory: Mild tachypnea but coarse wheeze throughout occasional rhonchi. Gastrointestinal: Soft and nontender.  Musculoskeletal:  no deformity Neurologic:  MAE spontaneously. No gross focal neurologic deficits are appreciated.  Skin:  Skin is warm, dry and intact. No rash noted. Psychiatric: Mood and affect are normal. Speech and behavior are normal.    ED Results / Procedures / Treatments   Labs (all labs ordered are  listed, but only abnormal results are displayed) Labs Reviewed  RESP PANEL BY RT-PCR (RSV, FLU A&B, COVID)  RVPGX2 - Abnormal; Notable for the following components:      Result Value   Resp Syncytial Virus by PCR POSITIVE (*)    All other components within normal limits  COMPREHENSIVE METABOLIC PANEL - Abnormal; Notable for the following components:   Chloride 97 (*)    Glucose, Bld 350 (*)    All other components within normal limits  BRAIN NATRIURETIC PEPTIDE - Abnormal; Notable for the following components:   B Natriuretic Peptide 387.4 (*)    All other components within normal limits  BLOOD GAS, VENOUS - Abnormal; Notable for the following components:   pO2, Ven 100 (*)    Bicarbonate 29.1 (*)    Acid-Base Excess 3.3 (*)    All other components within normal limits  TROPONIN I (HIGH SENSITIVITY) - Abnormal; Notable for the following components:   Troponin I (High Sensitivity) 27 (*)    All other components within normal limits  TROPONIN I (HIGH SENSITIVITY) - Abnormal; Notable for the following  components:   Troponin I (High Sensitivity) 27 (*)    All other components within normal limits  CULTURE, BLOOD (ROUTINE X 2)  CULTURE, BLOOD (ROUTINE X 2)  CBC WITH DIFFERENTIAL/PLATELET  LACTIC ACID, PLASMA  LACTIC ACID, PLASMA  BASIC METABOLIC PANEL  CBC  D-DIMER, QUANTITATIVE       RADIOLOGY Please see ED Course for my review and interpretation.  I personally reviewed all radiographic images ordered to evaluate for the above acute complaints and reviewed radiology reports and findings.  These findings were personally discussed with the patient.  Please see medical record for radiology report.    PROCEDURES:  Critical Care performed: Yes, see critical care procedure note(s)  .Critical Care  Performed by: Lang Dover, MD Authorized by: Lang Dover, MD   Critical care provider statement:    Critical care time (minutes):  40   Critical care was necessary to  treat or prevent imminent or life-threatening deterioration of the following conditions:  Respiratory failure   Critical care was time spent personally by me on the following activities:  Ordering and performing treatments and interventions, ordering and review of laboratory studies, ordering and review of radiographic studies, pulse oximetry, re-evaluation of patient's condition, review of old charts, obtaining history from patient or surrogate, examination of patient, evaluation of patient's response to treatment, discussions with primary provider, discussions with consultants and development of treatment plan with patient or surrogate    MEDICATIONS ORDERED IN ED: Medications  amLODipine  (NORVASC ) tablet 10 mg (has no administration in time range)  atorvastatin  (LIPITOR) tablet 40 mg (has no administration in time range)  ARIPiprazole  (ABILIFY ) tablet 15 mg (has no administration in time range)  hydrOXYzine  (ATARAX ) tablet 25 mg (25 mg Oral Given 02/21/23 2233)  nicotine  (NICODERM CQ  - dosed in mg/24 hours) patch 14 mg (has no administration in time range)  traZODone  (DESYREL ) tablet 50 mg (50 mg Oral Given 02/21/23 2234)  insulin  glargine-yfgn (SEMGLEE ) injection 20 Units (has no administration in time range)  apixaban  (ELIQUIS ) tablet 5 mg (has no administration in time range)  gabapentin  (NEURONTIN ) capsule 300 mg (has no administration in time range)  cefTRIAXone  (ROCEPHIN ) 1 g in sodium chloride  0.9 % 100 mL IVPB (0 g Intravenous Stopped 02/21/23 2258)  methylPREDNISolone  sodium succinate (SOLU-MEDROL ) 40 mg/mL injection 40 mg (has no administration in time range)    Followed by  predniSONE  (DELTASONE ) tablet 40 mg (has no administration in time range)  0.9 %  sodium chloride  infusion ( Intravenous New Bag/Given 02/21/23 2232)  acetaminophen  (TYLENOL ) tablet 650 mg (has no administration in time range)    Or  acetaminophen  (TYLENOL ) suppository 650 mg (has no administration in time range)   magnesium  hydroxide (MILK OF MAGNESIA) suspension 30 mL (has no administration in time range)  ondansetron  (ZOFRAN ) tablet 4 mg (has no administration in time range)    Or  ondansetron  (ZOFRAN ) injection 4 mg (has no administration in time range)  ipratropium-albuterol  (DUONEB) 0.5-2.5 (3) MG/3ML nebulizer solution 3 mL (has no administration in time range)  guaiFENesin  (MUCINEX ) 12 hr tablet 600 mg (600 mg Oral Given 02/21/23 2233)  chlorpheniramine-HYDROcodone  (TUSSIONEX) 10-8 MG/5ML suspension 5 mL (5 mLs Oral Given 02/21/23 2231)  ipratropium-albuterol  (DUONEB) 0.5-2.5 (3) MG/3ML nebulizer solution 3 mL (3 mLs Nebulization Given 02/21/23 2030)  ipratropium-albuterol  (DUONEB) 0.5-2.5 (3) MG/3ML nebulizer solution 3 mL (3 mLs Nebulization Given 02/21/23 2030)  azithromycin  (ZITHROMAX ) 500 mg in sodium chloride  0.9 % 250 mL IVPB (500 mg Intravenous New Bag/Given 02/21/23 2232)  IMPRESSION / MDM / ASSESSMENT AND PLAN / ED COURSE  I reviewed the triage vital signs and the nursing notes.                              Differential diagnosis includes, but is not limited to, Asthma, copd, CHF, pna, ptx, malignancy, Pe, anemia  Patient presenting to the ER for evaluation of symptoms as described above.  Based on symptoms, risk factors and considered above differential, this presenting complaint could reflect a potentially life-threatening illness therefore the patient will be placed on continuous pulse oximetry and telemetry for monitoring.  Laboratory evaluation will be sent to evaluate for the above complaints.  Presentation consistent with COPD exacerbation probable bronchitis but of also considered CHF.  Have a lower suspicion for PE based on physical exam as it seems most consistent with acute COPD exacerbation.  Will give additional nebulizer treatments and reassess for improvement.   Clinical Course as of 02/21/23 2344  Mon Feb 21, 2023  2138 Patient with some improvement but has had  increased work of breathing will place on BiPAP. [PR]  2153 Patient appears much more comfortable on BiPAP. [PR]  2202 Discussed case in consultation with hospitalist for admission. [PR]    Clinical Course User Index [PR] Lang Dover, MD     FINAL CLINICAL IMPRESSION(S) / ED DIAGNOSES   Final diagnoses:  Acute respiratory failure with hypoxia and hypercapnia (HCC)     Rx / DC Orders   ED Discharge Orders     None        Note:  This document was prepared using Dragon voice recognition software and may include unintentional dictation errors.    Lang Dover, MD 02/21/23 [phone]

## 2023-02-21 NOTE — H&P (Signed)
 [place]   PATIENT NAME: Henry Munoz    MR#:  [id number]  DATE OF BIRTH:  1965-03-07  DATE OF ADMISSION:  02/21/2023  PRIMARY CARE PHYSICIAN: [facility], [facility]   Patient is coming from: Home  REQUESTING/REFERRING PHYSICIAN: Lang Dover, MD  CHIEF COMPLAINT:   Chief Complaint  Patient presents with  . Respiratory Distress    HISTORY OF PRESENT ILLNESS:  Patricia Perales is a 58 y.o. African-American male with medical history significant for alcohol abuse, COPD, cocaine abuse, type 2 diabetes mellitus, diastolic function, hypertension, PE schizoaffective disorder/schizophrenia, CVA/TIA and tobacco abuse, who presented to the ER with acute onset of worsening dyspnea with associated cough productive of yellowish sputum and wheezing over the last 4 days with significant respiratory distress today.  He admitted to tactile fever and chills he has been expectorating yellowish sputum that was noted during triage.  He has been without all of his medications for more than a couple weeks.  He was given 125 mg of IV Solu-Medrol  2 DuoNebs and 2 albuterol  treatments by EMS prior to arrival to the ER.  No nausea or vomiting or abdominal pain.  No dysuria, oliguria or hematuria or flank pain.  No chest pain or palpitations.  ED Course: When the patient came to the ER, heart rate was 115 and respiratory 28 with pulse oximetry of 85% on room air and 92% on 2.5 L of O2 by nasal cannula then patient was placed on BiPAP at 50% FiO2 given persistent respiratory distress.  Labs revealed normal CBC.  BNP was 387.4 and high sensitive troponin I was 27.  Lactic acid was 1.9.  CMP revealed chloride of 97 and blood glucose of 350 and was otherwise unremarkable.  Blood gas showed a pH 7.39 and pCO2 of 48 with pO2 of 100, HCO3 29.1 and O2 sat 98.9%. EKG as reviewed by me : None. Imaging: Portable chest x-ray showed no acute cardiopulmonary disease.  The patient was given 2 g of IV  Rocephin , 2 DuoNebs in addition to what EMS gave him.  He will be admitted to a progressive unit bed for further evaluation and management. PAST MEDICAL HISTORY:   Past Medical History:  Diagnosis Date  . Alcohol abuse   . Cocaine abuse (HCC)   . Diabetes mellitus without complication (HCC)   . Diastolic dysfunction    a. 06/2022 Echo (in setting of acute PE):  EF 50-55%, no rwma, mod-sev LVH, grI DD, mod reduced RV fxn, triv MR w/ MVP of post leaflet, ? bicuspid AoV w/ triv AI and Ao sclerosis.  . Hypertension   . Pulmonary embolism (HCC)    a. 06/2022 CTA: Large volume PE affecting all lobar braches w/ R heart strain-->s/p mech thrombectomy.  . Schizo-affective schizophrenia (HCC)   . Stroke (HCC)   . TIA (transient ischemic attack)   . Tobacco abuse     PAST SURGICAL HISTORY:   Past Surgical History:  Procedure Laterality Date  . PULMONARY THROMBECTOMY Bilateral 06/30/2022   Procedure: PULMONARY THROMBECTOMY;  Surgeon: Jama Cordella MATSU, MD;  Location: [facility] INVASIVE CV LAB;  Service: Cardiovascular;  Laterality: Bilateral;    SOCIAL HISTORY:   Social History   Tobacco Use  . Smoking status: Every Day    Current packs/day: 0.50    Average packs/day: 0.5 packs/day for 15.0 years (7.5 ttl pk-yrs)    Types: Cigarettes  . Smokeless tobacco: Never  Substance Use Topics  . Alcohol use: Yes  Alcohol/week: 2.0 standard drinks of alcohol    Types: 2 Cans of beer per week    Comment: per week. 07/11/22 states does not drink    FAMILY HISTORY:   Family History  Problem Relation Age of Onset  . High blood pressure Father   . Diabetes Father   . Renal Disease Father     DRUG ALLERGIES:   Allergies  Allergen Reactions  . Haldol  [Haloperidol  Lactate]     REVIEW OF SYSTEMS:   ROS As per history of present illness. All pertinent systems were reviewed above. Constitutional, HEENT, cardiovascular, respiratory, GI, GU, musculoskeletal, neuro, psychiatric, endocrine,  integumentary and hematologic systems were reviewed and are otherwise negative/unremarkable except for positive findings mentioned above in the HPI.   MEDICATIONS AT HOME:   Prior to Admission medications   Medication Sig Start Date End Date Taking? Authorizing Provider  albuterol  (VENTOLIN  HFA) 108 (90 Base) MCG/ACT inhaler Inhale 2-4 puffs into the lungs every 4 (four) hours as needed for wheezing or shortness of breath. 01/27/23   Suzanne Kirsch, MD  amLODipine  (NORVASC ) 10 MG tablet Take 1 tablet (10 mg total) by mouth daily. 07/21/22 08/20/22  Massengill, Rankin, MD  apixaban  (ELIQUIS ) 5 MG TABS tablet Take 1 tablet (5 mg total) by mouth every 12 (twelve) hours. 07/20/22 08/19/22  Massengill, Rankin, MD  ARIPiprazole  (ABILIFY ) 15 MG tablet Take 1 tablet (15 mg total) by mouth daily. 07/21/22 08/20/22  Massengill, Rankin, MD  atorvastatin  (LIPITOR) 40 MG tablet Take 1 tablet (40 mg total) by mouth daily. 07/20/22   Massengill, Rankin, MD  Blood Glucose Monitoring Suppl DEVI 1 each by Does not apply route 3 (three) times daily. May dispense any manufacturer covered by patient's insurance. 07/28/22   Poggi, Jenna E, PA-C  gabapentin  (NEURONTIN ) 300 MG capsule Take 1 capsule (300 mg total) by mouth 3 (three) times daily as needed (back/leg pain). 11/06/22 11/06/23  Floy Roberts, MD  Glucose Blood (BLOOD GLUCOSE TEST STRIPS) STRP 1 each by Does not apply route 3 (three) times daily. Use as directed to check blood sugar. May dispense any manufacturer covered by patient's insurance and fits patient's device. 07/28/22   Poggi, Jenna E, PA-C  hydrOXYzine  (ATARAX ) 25 MG tablet Take 1 tablet (25 mg total) by mouth 3 (three) times daily as needed for anxiety. 07/20/22   Massengill, Rankin, MD  insulin  aspart (NOVOLOG ) 100 UNIT/ML FlexPen If eating and Blood Glucose (BG) 80 or higher inject 4 units for meal coverage and add correction dose per scale. If not eating, correction dose only. BG <150= 0 unit; BG 150-200=  1 unit; BG 201-250= 2 unit; BG 251-300= 3 unit; BG 301-350= 4 unit; BG 351-400= 5 unit; BG >400= 6 unit and Call Primary care. 07/28/22   Poggi, Jenna E, PA-C  insulin  glargine (LANTUS ) 100 UNIT/ML Solostar Pen Inject 20 Units into the skin daily. May substitute as needed per insurance. 07/28/22   Poggi, Jenna E, PA-C  Insulin  Pen Needle (PEN NEEDLES) 31G X 5 MM MISC 1 each by Does not apply route 3 (three) times daily. May dispense any manufacturer covered by patient's insurance. 07/28/22   Poggi, Jenna E, PA-C  Lancet Device MISC 1 each by Does not apply route 3 (three) times daily. May dispense any manufacturer covered by patient's insurance. 07/28/22   Poggi, Jenna E, PA-C  Lancets MISC 1 each by Does not apply route 3 (three) times daily. Use as directed to check blood sugar. May dispense any manufacturer covered by  patient's insurance and fits patient's device. Patient not taking: Reported on 07/11/2022 06/30/22   Josette Ade, MD  nicotine  (NICODERM CQ  - DOSED IN MG/24 HOURS) 14 mg/24hr patch Place 1 patch (14 mg total) onto the skin daily. 07/21/22   Massengill, Rankin, MD  traZODone  (DESYREL ) 50 MG tablet Take 1 tablet (50 mg total) by mouth at bedtime as needed for sleep. 07/20/22   Massengill, Rankin, MD      VITAL SIGNS:  Blood pressure (!) 169/100, pulse (!) 110, temperature 99.9 F (37.7 C), temperature source Oral, resp. rate (!) 32, weight 117.9 kg, SpO2 91%.  PHYSICAL EXAMINATION:  Physical Exam  GENERAL: Acutely ill 58 y.o.-year-old African American male patient lying in the bed with mild respiratory distress on BiPAP. EYES: Pupils equal, round, reactive to light and accommodation. No scleral icterus. Extraocular muscles intact.  HEENT: Head atraumatic, normocephalic. Oropharynx and nasopharynx clear.  NECK:  Supple, no jugular venous distention. No thyroid  enlargement, no tenderness.  LUNGS: Diffuse expiratory wheezes with tight expiratory airflow and harsh vesicular breathing.   No use of accessory muscles of respiration.  CARDIOVASCULAR: Regular rate and rhythm, S1, S2 normal. No murmurs, rubs, or gallops.  ABDOMEN: Soft, nondistended, nontender. Bowel sounds present. No organomegaly or mass.  EXTREMITIES: No pedal edema, cyanosis, or clubbing.  NEUROLOGIC: Cranial nerves II through XII are intact. Muscle strength 5/5 in all extremities. Sensation intact. Gait not checked.  PSYCHIATRIC: The patient is alert and oriented x 3.  Normal affect and good eye contact. SKIN: No obvious rash, lesion, or ulcer.   LABORATORY PANEL:   CBC Recent Labs  Lab 02/21/23 1949  WBC 6.9  HGB 14.7  HCT 46.2  PLT 205   ------------------------------------------------------------------------------------------------------------------  Chemistries  Recent Labs  Lab 02/21/23 1949  NA 136  K 3.9  CL 97*  CO2 25  GLUCOSE 350*  BUN 14  CREATININE 1.04  CALCIUM  8.9  AST 20  ALT 20  ALKPHOS 106  BILITOT 1.0   ------------------------------------------------------------------------------------------------------------------  Cardiac Enzymes No results for input(s): TROPONINI in the last 168 hours. ------------------------------------------------------------------------------------------------------------------  RADIOLOGY:  DG Chest Portable 1 View Result Date: 02/21/2023 CLINICAL DATA:  Shortness of breath. History of COPD, TIA, hypertension, diabetes, respiratory distress for more than 2 days, productive cough. EXAM: PORTABLE CHEST 1 VIEW COMPARISON:  01/27/2023 FINDINGS: The heart size and mediastinal contours are within normal limits. Both lungs are clear. The visualized skeletal structures are unremarkable. IMPRESSION: No active disease. Electronically Signed   By: Elsie Gravely M.D.   On: 02/21/2023 21:06      IMPRESSION AND PLAN:  Assessment and Plan: * Acute respiratory failure with hypoxia (HCC) - The patient will be admitted to a progressive unit bed. -  We will continue on BiPAP admission-O2 protocol performed. - This is likely secondary to COPD exacerbation. - Management otherwise as below. - Given elevated BNP will obtain a D-dimer level and if elevated chest CTA to rule out PE.SABRA  COPD exacerbation (HCC) - We will place the patient IV steroid therapy with IV Solu-Medrol  as well as nebulized bronchodilator therapy with duonebs q.i.d. and q.4 hours p.r.n.SABRA - Mucolytic therapy will be provided with Mucinex  and antibiotic therapy with IV Rocephin .  Uncontrolled type 2 diabetes mellitus with hyperglycemia, with long-term current use of insulin  (HCC) - The patient will be placed on supplemental coverage with NovoLog  with frequent fingerstick blood glucose measures especially given steroid therapy. - We will continue basal coverage. - This could be partly related to noncompliance.  Essential hypertension - We will continue antihypertensive therapy.  Dyslipidemia - We will continue statin therapy.  History of CVA (cerebrovascular accident) - We will continue his Plavix  and statin therapy.  History of pulmonary embolism - We will continue his Eliquis .  Diabetic neuropathy (HCC) - We will continue Neurontin .   . DVT prophylaxis: Eliquis  Advanced Care Planning:  Code Status: full code. Family Communication:  The plan of care was discussed in details with the patient (and family). I answered all questions. The patient agreed to proceed with the above mentioned plan. Further management will depend upon hospital course. Disposition Plan: Back to previous home environment Consults called: none. All the records are reviewed and case discussed with ED provider.  Status is: Inpatient  At the time of the admission, it appears that the appropriate admission status for this patient is inpatient.  This is judged to be reasonable and necessary in order to provide the required intensity of service to ensure the patient's safety given the  presenting symptoms, physical exam findings and initial radiographic and laboratory data in the context of comorbid conditions.  The patient requires inpatient status due to high intensity of service, high risk of further deterioration and high frequency of surveillance required.  I certify that at the time of admission, it is my clinical judgment that the patient will require inpatient hospital care extending more than 2 midnights.                            Dispo: The patient is from: Home              Anticipated d/c is to: Home              Patient currently is not medically stable to d/c.              Difficult to place patient: No  Authorized and performed by: Madison Peaches, MD Total critical care time:   55     minutes. Due to a high probability of clinically significant, life-threatening deterioration, the patient required my highest level of preparedness to intervene emergently and I personally spent this critical care time directly and personally managing the patient.  This critical care time included obtaining a history, examining the patient, pulse oximetry, ordering and review of studies, arranging urgent treatment with development of management plan, evaluation of patient's response to treatment, frequent reassessment, and discussions with other providers. This critical care time was performed to assess and manage the high probability of imminent, life-threatening deterioration that could result in multiorgan failure.  It was exclusive of separately billable procedures and treating other patients and teaching time.   Madison DELENA Peaches M.D on 02/21/2023 at 11:40 PM  [facility]   From 7 PM-7 AM, contact night-coverage [email or web address]  CC: Primary care physician; [facility], [facility]

## 2023-02-21 NOTE — ED Triage Notes (Signed)
 Pt arrives from home via ems hx COPD, TIA, HTN, IDDM, resp distress for more than 2 days productive cough of thick yellow mucous noted during triage pt reports without all medication for more than 2 weeks pt aox4 18g PIV to RAC ems admin 125mg  Solumedrol;Duo nebx2 , Albuterol  x2 prior to arrival during transport

## 2023-02-21 NOTE — Assessment & Plan Note (Signed)
-   We will continue his Eliquis. 

## 2023-02-22 ENCOUNTER — Encounter: Payer: Self-pay | Admitting: Family Medicine

## 2023-02-22 ENCOUNTER — Emergency Department: Payer: MEDICAID

## 2023-02-22 ENCOUNTER — Inpatient Hospital Stay
Admission: EM | Admit: 2023-02-22 | Discharge: 2023-02-26 | Disposition: A | Discharge status: Home / Self Care | Payer: MEDICAID | Attending: Hospitalist | Admitting: Hospitalist

## 2023-02-22 ENCOUNTER — Encounter: Payer: Self-pay | Admitting: Emergency Medicine

## 2023-02-22 DIAGNOSIS — Z8673 Personal history of transient ischemic attack (TIA), and cerebral infarction without residual deficits: Secondary | ICD-10-CM

## 2023-02-22 DIAGNOSIS — Z6821 Body mass index (BMI) 21.0-21.9, adult: Secondary | ICD-10-CM

## 2023-02-22 DIAGNOSIS — I110 Hypertensive heart disease with heart failure: Secondary | ICD-10-CM | POA: Diagnosis present

## 2023-02-22 DIAGNOSIS — E1165 Type 2 diabetes mellitus with hyperglycemia: Secondary | ICD-10-CM | POA: Diagnosis present

## 2023-02-22 DIAGNOSIS — Z8249 Family history of ischemic heart disease and other diseases of the circulatory system: Secondary | ICD-10-CM

## 2023-02-22 DIAGNOSIS — Z86711 Personal history of pulmonary embolism: Secondary | ICD-10-CM

## 2023-02-22 DIAGNOSIS — Z888 Allergy status to other drugs, medicaments and biological substances status: Secondary | ICD-10-CM

## 2023-02-22 DIAGNOSIS — I10 Essential (primary) hypertension: Secondary | ICD-10-CM | POA: Diagnosis not present

## 2023-02-22 DIAGNOSIS — Z7985 Long-term (current) use of injectable non-insulin antidiabetic drugs: Secondary | ICD-10-CM

## 2023-02-22 DIAGNOSIS — B974 Respiratory syncytial virus as the cause of diseases classified elsewhere: Secondary | ICD-10-CM | POA: Diagnosis present

## 2023-02-22 DIAGNOSIS — E669 Obesity, unspecified: Secondary | ICD-10-CM | POA: Diagnosis present

## 2023-02-22 DIAGNOSIS — Z79899 Other long term (current) drug therapy: Secondary | ICD-10-CM

## 2023-02-22 DIAGNOSIS — F1410 Cocaine abuse, uncomplicated: Secondary | ICD-10-CM | POA: Diagnosis present

## 2023-02-22 DIAGNOSIS — Z7901 Long term (current) use of anticoagulants: Secondary | ICD-10-CM

## 2023-02-22 DIAGNOSIS — Z794 Long term (current) use of insulin: Secondary | ICD-10-CM

## 2023-02-22 DIAGNOSIS — Z7951 Long term (current) use of inhaled steroids: Secondary | ICD-10-CM

## 2023-02-22 DIAGNOSIS — J441 Chronic obstructive pulmonary disease with (acute) exacerbation: Principal | ICD-10-CM | POA: Diagnosis present

## 2023-02-22 DIAGNOSIS — J9601 Acute respiratory failure with hypoxia: Secondary | ICD-10-CM | POA: Diagnosis present

## 2023-02-22 DIAGNOSIS — Z7902 Long term (current) use of antithrombotics/antiplatelets: Secondary | ICD-10-CM

## 2023-02-22 DIAGNOSIS — I5032 Chronic diastolic (congestive) heart failure: Secondary | ICD-10-CM | POA: Diagnosis present

## 2023-02-22 DIAGNOSIS — F259 Schizoaffective disorder, unspecified: Secondary | ICD-10-CM | POA: Diagnosis present

## 2023-02-22 DIAGNOSIS — Z833 Family history of diabetes mellitus: Secondary | ICD-10-CM

## 2023-02-22 DIAGNOSIS — F1910 Other psychoactive substance abuse, uncomplicated: Secondary | ICD-10-CM

## 2023-02-22 DIAGNOSIS — F17210 Nicotine dependence, cigarettes, uncomplicated: Secondary | ICD-10-CM | POA: Diagnosis present

## 2023-02-22 DIAGNOSIS — F1010 Alcohol abuse, uncomplicated: Secondary | ICD-10-CM | POA: Diagnosis present

## 2023-02-22 DIAGNOSIS — R0902 Hypoxemia: Secondary | ICD-10-CM

## 2023-02-22 DIAGNOSIS — I5030 Unspecified diastolic (congestive) heart failure: Secondary | ICD-10-CM | POA: Insufficient documentation

## 2023-02-22 LAB — CBC
HCT: 38.3 % — ABNORMAL LOW (ref 39.0–52.0)
HCT: 41.4 % (ref 39.0–52.0)
Hemoglobin: 12.2 g/dL — ABNORMAL LOW (ref 13.0–17.0)
Hemoglobin: 13 g/dL (ref 13.0–17.0)
MCH: 30.7 pg (ref 26.0–34.0)
MCH: 30.7 pg (ref 26.0–34.0)
MCHC: 31.9 g/dL (ref 30.0–36.0)
MCHC: 31.4 g/dL (ref 30.0–36.0)
MCV: 96.5 fL (ref 80.0–100.0)
MCV: 97.6 fL (ref 80.0–100.0)
Platelets: 188 10*3/uL (ref 150–400)
Platelets: 213 10*3/uL (ref 150–400)
RBC: 3.97 MIL/uL — ABNORMAL LOW (ref 4.22–5.81)
RBC: 4.24 MIL/uL (ref 4.22–5.81)
RDW: 13.6 % (ref 11.5–15.5)
RDW: 13.9 % (ref 11.5–15.5)
WBC: 5.3 10*3/uL (ref 4.0–10.5)
WBC: 6 10*3/uL (ref 4.0–10.5)
nRBC: 0 % (ref 0.0–0.2)
nRBC: 0 % (ref 0.0–0.2)

## 2023-02-22 LAB — BASIC METABOLIC PANEL
Anion gap: 13 (ref 5–15)
Anion gap: 17 — ABNORMAL HIGH (ref 5–15)
BUN: 15 mg/dL (ref 6–20)
BUN: 22 mg/dL — ABNORMAL HIGH (ref 6–20)
CO2: 24 mmol/L (ref 22–32)
CO2: 23 mmol/L (ref 22–32)
Calcium: 8.8 mg/dL — ABNORMAL LOW (ref 8.9–10.3)
Calcium: 9.4 mg/dL (ref 8.9–10.3)
Chloride: 99 mmol/L (ref 98–111)
Chloride: 94 mmol/L — ABNORMAL LOW (ref 98–111)
Creatinine, Ser: 1.05 mg/dL (ref 0.61–1.24)
Creatinine, Ser: 1.2 mg/dL (ref 0.61–1.24)
GFR, Estimated: >60 mL/min (ref >60)
GFR, Estimated: >60 mL/min (ref >60)
Glucose, Bld: 423 mg/dL — ABNORMAL HIGH (ref 70–99)
Glucose, Bld: 476 mg/dL — ABNORMAL HIGH (ref 70–99)
Potassium: 4.6 mmol/L (ref 3.5–5.1)
Potassium: 4.3 mmol/L (ref 3.5–5.1)
Sodium: 136 mmol/L (ref 135–145)
Sodium: 134 mmol/L — ABNORMAL LOW (ref 135–145)

## 2023-02-22 LAB — BRAIN NATRIURETIC PEPTIDE: B Natriuretic Peptide: 32.7 pg/mL (ref 0.0–100.0)

## 2023-02-22 LAB — CBG MONITORING, ED
Glucose-Capillary: 516 mg/dL (ref 70–99)
Glucose-Capillary: 421 mg/dL — ABNORMAL HIGH (ref 70–99)
Glucose-Capillary: 362 mg/dL — ABNORMAL HIGH (ref 70–99)

## 2023-02-22 LAB — HEMOGLOBIN A1C
Hgb A1c MFr Bld: 12.6 % — ABNORMAL HIGH (ref 4.8–5.6)
Mean Plasma Glucose: 314.92 mg/dL

## 2023-02-22 LAB — TROPONIN I (HIGH SENSITIVITY): Troponin I (High Sensitivity): 16 ng/L (ref <18)

## 2023-02-22 LAB — D-DIMER, QUANTITATIVE: D-Dimer, Quant: 3.53 ug{FEU}/mL — ABNORMAL HIGH (ref 0.00–0.50)

## 2023-02-22 MED ORDER — METHYLPREDNISOLONE SODIUM SUCC 125 MG IJ SOLR
125.0000 mg | Freq: Once | INTRAMUSCULAR | Status: AC
  Administered 2023-02-22: 125 mg via INTRAVENOUS
  Filled 2023-02-22: qty 2

## 2023-02-22 MED ORDER — INSULIN ASPART 100 UNIT/ML IJ SOLN: 0.0000 [IU] | Freq: Every day | INTRAMUSCULAR | Status: DC

## 2023-02-22 MED ORDER — IPRATROPIUM-ALBUTEROL 0.5-2.5 (3) MG/3ML IN SOLN
3.0000 mL | Freq: Once | RESPIRATORY_TRACT | Status: AC
  Administered 2023-02-22: 3 mL via RESPIRATORY_TRACT
  Filled 2023-02-22: qty 3

## 2023-02-22 MED ORDER — TRAZODONE HCL 50 MG PO TABS: 50.0000 mg | ORAL_TABLET | Freq: Every evening | ORAL | Status: DC | PRN

## 2023-02-22 MED ORDER — ONDANSETRON HCL 4 MG/2ML IJ SOLN: 4.0000 mg | Freq: Four times a day (QID) | INTRAMUSCULAR | Status: DC | PRN

## 2023-02-22 MED ORDER — INSULIN ASPART 100 UNIT/ML IJ SOLN
0.0000 [IU] | Freq: Three times a day (TID) | INTRAMUSCULAR | Status: DC
  Administered 2023-02-22 (×2): 9 [IU] via SUBCUTANEOUS
  Filled 2023-02-22: qty 1

## 2023-02-22 MED ORDER — POLYETHYLENE GLYCOL 3350 17 G PO PACK: 17.0000 g | PACK | Freq: Every day | ORAL | Status: DC | PRN

## 2023-02-22 MED ORDER — PREDNISONE 20 MG PO TABS
40.0000 mg | ORAL_TABLET | Freq: Every day | ORAL | Status: DC
Start: 2023-02-23 — End: 2023-02-26
  Administered 2023-02-23 – 2023-02-26 (×4): 40 mg via ORAL
  Filled 2023-02-22 (×4): qty 2

## 2023-02-22 MED ORDER — ARIPIPRAZOLE 2 MG PO TABS
15.0000 mg | ORAL_TABLET | Freq: Every day | ORAL | Status: DC
  Administered 2023-02-23 – 2023-02-26 (×4): 15 mg via ORAL
  Filled 2023-02-22: qty 8
  Filled 2023-02-22 (×2): qty 1
  Filled 2023-02-22: qty 8

## 2023-02-22 MED ORDER — INSULIN ASPART 100 UNIT/ML IJ SOLN
10.0000 [IU] | Freq: Once | INTRAMUSCULAR | Status: AC
  Administered 2023-02-22: 10 [IU] via INTRAVENOUS
  Filled 2023-02-22: qty 1

## 2023-02-22 MED ORDER — ONDANSETRON HCL 4 MG PO TABS: 4.0000 mg | ORAL_TABLET | Freq: Four times a day (QID) | ORAL | Status: DC | PRN

## 2023-02-22 MED ORDER — APIXABAN 5 MG PO TABS
5.0000 mg | ORAL_TABLET | Freq: Two times a day (BID) | ORAL | Status: DC
  Administered 2023-02-22 – 2023-02-26 (×8): 5 mg via ORAL
  Filled 2023-02-22 (×8): qty 1

## 2023-02-22 MED ORDER — GUAIFENESIN ER 600 MG PO TB12
600.0000 mg | ORAL_TABLET | Freq: Two times a day (BID) | ORAL | Status: DC
Start: 2023-02-22 — End: 2023-02-26
  Administered 2023-02-22 – 2023-02-26 (×8): 600 mg via ORAL
  Filled 2023-02-22 (×8): qty 1

## 2023-02-22 MED ORDER — ACETAMINOPHEN 650 MG RE SUPP: 650.0000 mg | Freq: Four times a day (QID) | RECTAL | Status: DC | PRN

## 2023-02-22 MED ORDER — ACETAMINOPHEN 325 MG PO TABS: 650.0000 mg | ORAL_TABLET | Freq: Four times a day (QID) | ORAL | Status: DC | PRN

## 2023-02-22 MED ORDER — BUDESONIDE 0.25 MG/2ML IN SUSP
0.2500 mg | Freq: Two times a day (BID) | RESPIRATORY_TRACT | Status: DC
  Administered 2023-02-22 – 2023-02-26 (×8): 0.25 mg via RESPIRATORY_TRACT
  Filled 2023-02-22 (×9): qty 2

## 2023-02-22 MED ORDER — IPRATROPIUM-ALBUTEROL 0.5-2.5 (3) MG/3ML IN SOLN
3.0000 mL | Freq: Four times a day (QID) | RESPIRATORY_TRACT | Status: DC
  Administered 2023-02-22 – 2023-02-24 (×7): 3 mL via RESPIRATORY_TRACT
  Filled 2023-02-22 (×8): qty 3

## 2023-02-22 MED ORDER — INSULIN GLARGINE-YFGN 100 UNIT/ML ~~LOC~~ SOLN
20.0000 [IU] | Freq: Every day | SUBCUTANEOUS | Status: DC
  Administered 2023-02-22: 20 [IU] via SUBCUTANEOUS
  Filled 2023-02-22 (×2): qty 0.2

## 2023-02-22 MED ORDER — SODIUM CHLORIDE 0.9% FLUSH
3.0000 mL | Freq: Two times a day (BID) | INTRAVENOUS | Status: DC
  Administered 2023-02-22 – 2023-02-26 (×7): 3 mL via INTRAVENOUS

## 2023-02-22 MED ORDER — ATORVASTATIN CALCIUM 20 MG PO TABS
40.0000 mg | ORAL_TABLET | Freq: Every day | ORAL | Status: DC
  Administered 2023-02-23: 40 mg via ORAL
  Administered 2023-02-24: 20 mg via ORAL
  Administered 2023-02-25 – 2023-02-26 (×2): 40 mg via ORAL
  Filled 2023-02-22 (×4): qty 2

## 2023-02-22 MED ORDER — FUROSEMIDE 10 MG/ML IJ SOLN: 20.0000 mg | Freq: Once | INTRAMUSCULAR | Status: DC

## 2023-02-22 MED ORDER — GABAPENTIN 300 MG PO CAPS
600.0000 mg | ORAL_CAPSULE | Freq: Three times a day (TID) | ORAL | Status: DC
  Administered 2023-02-22 – 2023-02-26 (×10): 600 mg via ORAL
  Filled 2023-02-22 (×11): qty 2

## 2023-02-22 MED ORDER — ALBUTEROL SULFATE HFA 108 (90 BASE) MCG/ACT IN AERS: 2.0000 | INHALATION_SPRAY | RESPIRATORY_TRACT | Status: DC | PRN

## 2023-02-22 MED ORDER — AMLODIPINE BESYLATE 10 MG PO TABS
10.0000 mg | ORAL_TABLET | Freq: Every day | ORAL | Status: DC
  Administered 2023-02-23 – 2023-02-26 (×4): 10 mg via ORAL
  Filled 2023-02-22: qty 2
  Filled 2023-02-22: qty 1
  Filled 2023-02-22: qty 2
  Filled 2023-02-22: qty 1

## 2023-02-22 MED ORDER — HYDROXYZINE HCL 25 MG PO TABS: 25.0000 mg | ORAL_TABLET | Freq: Three times a day (TID) | ORAL | Status: DC | PRN

## 2023-02-22 MED ORDER — INSULIN ASPART 100 UNIT/ML IJ SOLN
0.0000 [IU] | Freq: Three times a day (TID) | INTRAMUSCULAR | Status: DC
Start: 2023-02-23 — End: 2023-02-26
  Administered 2023-02-23 (×3): 11 [IU] via SUBCUTANEOUS
  Administered 2023-02-24 (×3): 7 [IU] via SUBCUTANEOUS
  Administered 2023-02-25 – 2023-02-26 (×3): 4 [IU] via SUBCUTANEOUS
  Filled 2023-02-22 (×10): qty 1

## 2023-02-22 NOTE — Assessment & Plan Note (Signed)
 Secondary to RSV.  - S/p Solu-Medrol 125 mg once - Start prednisone 40 mg tomorrow to complete a 5-day course - DuoNebs every 6 hours - Pulmicort twice daily - Pulmonary toilet

## 2023-02-22 NOTE — Discharge Summary (Signed)
 Physician Discharge Summary  Henry Munoz FMW:[id number] DOB: 1965/04/04 DOA: 02/21/2023  PCP: [facility]  Admit date: 02/21/2023 Discharge date: 02/22/2023  Admitted From: home  Disposition:  Pt left AMA   Recommendations for Outpatient Follow-up:  Pt left AMA   Home Health: no  Equipment/Devices:  Discharge Condition: guarded  CODE STATUS: full  Diet recommendation: Heart Healthy / Carb Modified  Brief/Interim Summary: HPI was taken from Dr. Lawence Emil Ferlando Munoz is a 58 y.o. African-American male with medical history significant for alcohol abuse, COPD, cocaine abuse, type 2 diabetes mellitus, diastolic function, hypertension, PE schizoaffective disorder/schizophrenia, CVA/TIA and tobacco abuse, who presented to the ER with acute onset of worsening dyspnea with associated cough productive of yellowish sputum and wheezing over the last 4 days with significant respiratory distress today.  He admitted to tactile fever and chills he has been expectorating yellowish sputum that was noted during triage.  He has been without all of his medications for more than a couple weeks.  He was given 125 mg of IV Solu-Medrol  2 DuoNebs and 2 albuterol  treatments by EMS prior to arrival to the ER.  No nausea or vomiting or abdominal pain.  No dysuria, oliguria or hematuria or flank pain.  No chest pain or palpitations.   ED Course: When the patient came to the ER, heart rate was 115 and respiratory 28 with pulse oximetry of 85% on room air and 92% on 2.5 L of O2 by nasal cannula then patient was placed on BiPAP at 50% FiO2 given persistent respiratory distress.  Labs revealed normal CBC.  BNP was 387.4 and high sensitive troponin I was 27.  Lactic acid was 1.9.  CMP revealed chloride of 97 and blood glucose of 350 and was otherwise unremarkable.  Blood gas showed a pH 7.39 and pCO2 of 48 with pO2 of 100, HCO3 29.1 and O2 sat 98.9%. EKG as reviewed by me : None. Imaging: Portable  chest x-ray showed no acute cardiopulmonary disease.   The patient was given 2 g of IV Rocephin , 2 DuoNebs in addition to what EMS gave him.  He will be admitted to a progressive unit bed for further evaluation and management.    Pt decided to [facility] because he just wants to go. Pt is AA&Ox4. Discussed w/ the pt why he needed to stay in the hospital and why it was not safe for him to go home and pt verbalized his understanding. High risk for re-admission.   Discharge Diagnoses:  Principal Problem:   Acute respiratory failure with hypoxia (HCC) Active Problems:   COPD exacerbation (HCC)   Uncontrolled type 2 diabetes mellitus with hyperglycemia, with long-term current use of insulin  (HCC)   Essential hypertension   Dyslipidemia   Diabetic neuropathy (HCC)   History of pulmonary embolism   History of CVA (cerebrovascular accident)  As per Dr. Lawence:    Acute respiratory failure with hypoxia [facility]) - The patient will be admitted to a progressive unit bed. - We will continue on BiPAP admission-O2 protocol performed. - This is likely secondary to COPD exacerbation. - Management otherwise as below. - Given elevated BNP will obtain a D-dimer level and if elevated chest CTA to rule out PE.SABRA   COPD exacerbation (HCC) - We will place the patient IV steroid therapy with IV Solu-Medrol  as well as nebulized bronchodilator therapy with duonebs q.i.d. and q.4 hours p.r.n.SABRA - Mucolytic therapy will be provided with Mucinex  and antibiotic therapy with IV Rocephin .  Uncontrolled type 2 diabetes mellitus with hyperglycemia, with long-term current use of insulin  (HCC) - The patient will be placed on supplemental coverage with NovoLog  with frequent fingerstick blood glucose measures especially given steroid therapy. - We will continue basal coverage. - This could be partly related to noncompliance.   Essential hypertension - We will continue antihypertensive therapy.   Dyslipidemia - We will  continue statin therapy.   History of CVA (cerebrovascular accident) - We will continue his Plavix  and statin therapy.   History of pulmonary embolism - We will continue his Eliquis .   Diabetic neuropathy (HCC) - We will continue Neurontin .  Discharge Instructions     Allergies  Allergen Reactions   Haldol  [Haloperidol  Lactate]     Consultations:    Procedures/Studies: DG Chest Portable 1 View Result Date: 02/21/2023 CLINICAL DATA:  Shortness of breath. History of COPD, TIA, hypertension, diabetes, respiratory distress for more than 2 days, productive cough. EXAM: PORTABLE CHEST 1 VIEW COMPARISON:  01/27/2023 FINDINGS: The heart size and mediastinal contours are within normal limits. Both lungs are clear. The visualized skeletal structures are unremarkable. IMPRESSION: No active disease. Electronically Signed   By: Elsie Gravely M.D.   On: 02/21/2023 21:06   DG Chest 2 View Result Date: 01/27/2023 CLINICAL DATA:  COPD. Shortness of breath with wheezing. History of pulmonary embolism. EXAM: CHEST - 2 VIEW COMPARISON:  Radiographs 07/02/2022 and 06/29/2022.  CT 06/29/2022. FINDINGS: Suboptimal inspiration with resulting mild bibasilar atelectasis. No confluent airspace disease, edema, pleural effusion or pneumothorax. The heart size and mediastinal contours are stable. The bones appear unremarkable. Telemetry leads overlie the chest. IMPRESSION: Suboptimal inspiration with mild bibasilar atelectasis. No evidence of pneumonia or edema. Electronically Signed   By: Elsie Perone M.D.   On: 01/27/2023 15:53   (Echo, Carotid, EGD, Colonoscopy, ERCP)    Subjective: Pt c/o shortness of breath    Discharge Exam: Vitals:   02/22/23 1430 02/22/23 1431  BP: (!) 149/120   Pulse: (!) 117   Resp: (!) 41   Temp:  98.7 F (37.1 C)  SpO2: 98%    Vitals:   02/22/23 1330 02/22/23 1400 02/22/23 1430 02/22/23 1431  BP:  127/86 (!) 149/120   Pulse: (!) 101 (!) 104 (!) 117   Resp:  (!) 26 (!) 28 (!) 41   Temp:    98.7 F (37.1 C)  TempSrc:    Oral  SpO2: 99% 99% 98%   Weight:        General: Pt is alert, awake, not in acute distress Cardiovascular: S1/S2 +, no rubs, no gallops Respiratory: course breath sounds b/l  Abdominal: Soft, NT, obese, bowel sounds + Extremities: no cyanosis    The results of significant diagnostics from this hospitalization (including imaging, microbiology, ancillary and laboratory) are listed below for reference.     Microbiology: Recent Results (from the past 240 hours)  Resp panel by RT-PCR (RSV, Flu A&B, Covid) Anterior Nasal Swab     Status: Abnormal   Collection Time: 02/21/23  7:49 PM   Specimen: Anterior Nasal Swab  Result Value Ref Range Status   SARS Coronavirus 2 by RT PCR NEGATIVE NEGATIVE Final    Comment: (NOTE) SARS-CoV-2 target nucleic acids are NOT DETECTED.  The SARS-CoV-2 RNA is generally detectable in upper respiratory specimens during the acute phase of infection. The lowest concentration of SARS-CoV-2 viral copies this assay can detect is 138 copies/mL. A negative result does not preclude SARS-Cov-2 infection and should not be used as  the sole basis for treatment or other patient management decisions. A negative result may occur with  improper specimen collection/handling, submission of specimen other than nasopharyngeal swab, presence of viral mutation(s) within the areas targeted by this assay, and inadequate number of viral copies(<138 copies/mL). A negative result must be combined with clinical observations, patient history, and epidemiological information. The expected result is Negative.  Fact Sheet for Patients:  bloggercourse.com  Fact Sheet for Healthcare Providers:  seriousbroker.it  This test is no t yet approved or cleared by the [place]  FDA and  has been authorized for detection and/or diagnosis of SARS-CoV-2 by FDA under an  Emergency Use Authorization (EUA). This EUA will remain  in effect (meaning this test can be used) for the duration of the COVID-19 declaration under Section 564(b)(1) of the Act, 21 U.S.C.section 360bbb-3(b)(1), unless the authorization is terminated  or revoked sooner.       Influenza A by PCR NEGATIVE NEGATIVE Final   Influenza B by PCR NEGATIVE NEGATIVE Final    Comment: (NOTE) The Xpert Xpress SARS-CoV-2/FLU/RSV plus assay is intended as an aid in the diagnosis of influenza from Nasopharyngeal swab specimens and should not be used as a sole basis for treatment. Nasal washings and aspirates are unacceptable for Xpert Xpress SARS-CoV-2/FLU/RSV testing.  Fact Sheet for Patients: bloggercourse.com  Fact Sheet for Healthcare Providers: seriousbroker.it  This test is not yet approved or cleared by the [place]  FDA and has been authorized for detection and/or diagnosis of SARS-CoV-2 by FDA under an Emergency Use Authorization (EUA). This EUA will remain in effect (meaning this test can be used) for the duration of the COVID-19 declaration under Section 564(b)(1) of the Act, 21 U.S.C. section 360bbb-3(b)(1), unless the authorization is terminated or revoked.     Resp Syncytial Virus by PCR POSITIVE (A) NEGATIVE Final    Comment: (NOTE) Fact Sheet for Patients: bloggercourse.com  Fact Sheet for Healthcare Providers: seriousbroker.it  This test is not yet approved or cleared by the [place]  FDA and has been authorized for detection and/or diagnosis of SARS-CoV-2 by FDA under an Emergency Use Authorization (EUA). This EUA will remain in effect (meaning this test can be used) for the duration of the COVID-19 declaration under Section 564(b)(1) of the Act, 21 U.S.C. section 360bbb-3(b)(1), unless the authorization is terminated or revoked.  Performed at [facility], [address] [place], [place] [address]   Blood Culture (routine x 2)     Status: None (Preliminary result)   Collection Time: 02/21/23  7:49 PM   Specimen: BLOOD  Result Value Ref Range Status   Specimen Description BLOOD RIGHT ANTECUBITAL  Final   Special Requests   Final    BOTTLES DRAWN AEROBIC AND ANAEROBIC Blood Culture results may not be optimal due to an inadequate volume of blood received in culture bottles   Culture   Final    NO GROWTH < 12 HOURS Performed at [facility], [address] [place], [place] [address]    Report Status PENDING  Incomplete  Blood Culture (routine x 2)     Status: None (Preliminary result)   Collection Time: 02/21/23 10:35 PM   Specimen: BLOOD  Result Value Ref Range Status   Specimen Description BLOOD LEFT ASSIST CONTROL  Final   Special Requests   Final    BOTTLES DRAWN AEROBIC AND ANAEROBIC Blood Culture results may not be optimal due to an inadequate volume of blood received in culture bottles   Culture  Final    NO GROWTH < 12 HOURS Performed at [facility], [address] [place], [place] [address]    Report Status PENDING  Incomplete     Labs: BNP (last 3 results) Recent Labs    06/29/22 0446 02/21/23 1949  BNP 205.8* 387.4*   Basic Metabolic Panel: Recent Labs  Lab 02/21/23 1949 02/22/23 0400  NA 136 136  K 3.9 4.6  CL 97* 99  CO2 25 24  GLUCOSE 350* 423*  BUN 14 15  CREATININE 1.04 1.05  CALCIUM  8.9 8.8*   Liver Function Tests: Recent Labs  Lab 02/21/23 1949  AST 20  ALT 20  ALKPHOS 106  BILITOT 1.0  PROT 7.4  ALBUMIN 3.7   No results for input(s): LIPASE, AMYLASE in the last 168 hours. No results for input(s): AMMONIA  in the last 168 hours. CBC: Recent Labs  Lab 02/21/23 1949 02/22/23 0400  WBC 6.9 5.3  NEUTROABS 4.6  --   HGB 14.7 12.2*  HCT 46.2 38.3*  MCV 97.9 96.5  PLT 205 188   Cardiac Enzymes: No results for input(s): CKTOTAL, CKMB, CKMBINDEX,  TROPONINI in the last 168 hours. BNP: Invalid input(s): POCBNP CBG: Recent Labs  Lab 02/22/23 1003 02/22/23 1233  GLUCAP 516* 421*   D-Dimer Recent Labs    02/21/23 1949  DDIMER 3.53*   Hgb A1c Recent Labs    02/22/23 0400  HGBA1C 12.6*   Lipid Profile No results for input(s): CHOL, HDL, LDLCALC, TRIG, CHOLHDL, LDLDIRECT in the last 72 hours. Thyroid  function studies No results for input(s): TSH, T4TOTAL, T3FREE, THYROIDAB in the last 72 hours.  Invalid input(s): FREET3 Anemia work up No results for input(s): VITAMINB12, FOLATE, FERRITIN, TIBC, IRON, RETICCTPCT in the last 72 hours. Urinalysis    Component Value Date/Time   COLORURINE STRAW (A) 11/06/2022 2114   APPEARANCEUR CLEAR (A) 11/06/2022 2114   LABSPEC 1.032 (H) 11/06/2022 2114   PHURINE 7.0 11/06/2022 2114   GLUCOSEU >=500 (A) 11/06/2022 2114   HGBUR NEGATIVE 11/06/2022 2114   BILIRUBINUR NEGATIVE 11/06/2022 2114   KETONESUR NEGATIVE 11/06/2022 2114   PROTEINUR NEGATIVE 11/06/2022 2114   NITRITE NEGATIVE 11/06/2022 2114   LEUKOCYTESUR NEGATIVE 11/06/2022 2114   Sepsis Labs Recent Labs  Lab 02/21/23 1949 02/22/23 0400  WBC 6.9 5.3   Microbiology Recent Results (from the past 240 hours)  Resp panel by RT-PCR (RSV, Flu A&B, Covid) Anterior Nasal Swab     Status: Abnormal   Collection Time: 02/21/23  7:49 PM   Specimen: Anterior Nasal Swab  Result Value Ref Range Status   SARS Coronavirus 2 by RT PCR NEGATIVE NEGATIVE Final    Comment: (NOTE) SARS-CoV-2 target nucleic acids are NOT DETECTED.  The SARS-CoV-2 RNA is generally detectable in upper respiratory specimens during the acute phase of infection. The lowest concentration of SARS-CoV-2 viral copies this assay can detect is 138 copies/mL. A negative result does not preclude SARS-Cov-2 infection and should not be used as the sole basis for treatment or other patient management decisions. A negative result  may occur with  improper specimen collection/handling, submission of specimen other than nasopharyngeal swab, presence of viral mutation(s) within the areas targeted by this assay, and inadequate number of viral copies(<138 copies/mL). A negative result must be combined with clinical observations, patient history, and epidemiological information. The expected result is Negative.  Fact Sheet for Patients:  bloggercourse.com  Fact Sheet for Healthcare Providers:  seriousbroker.it  This test is no t yet approved or  cleared by the [place]  FDA and  has been authorized for detection and/or diagnosis of SARS-CoV-2 by FDA under an Emergency Use Authorization (EUA). This EUA will remain  in effect (meaning this test can be used) for the duration of the COVID-19 declaration under Section 564(b)(1) of the Act, 21 U.S.C.section 360bbb-3(b)(1), unless the authorization is terminated  or revoked sooner.       Influenza A by PCR NEGATIVE NEGATIVE Final   Influenza B by PCR NEGATIVE NEGATIVE Final    Comment: (NOTE) The Xpert Xpress SARS-CoV-2/FLU/RSV plus assay is intended as an aid in the diagnosis of influenza from Nasopharyngeal swab specimens and should not be used as a sole basis for treatment. Nasal washings and aspirates are unacceptable for Xpert Xpress SARS-CoV-2/FLU/RSV testing.  Fact Sheet for Patients: bloggercourse.com  Fact Sheet for Healthcare Providers: seriousbroker.it  This test is not yet approved or cleared by the [place]  FDA and has been authorized for detection and/or diagnosis of SARS-CoV-2 by FDA under an Emergency Use Authorization (EUA). This EUA will remain in effect (meaning this test can be used) for the duration of the COVID-19 declaration under Section 564(b)(1) of the Act, 21 U.S.C. section 360bbb-3(b)(1), unless the authorization is terminated  or revoked.     Resp Syncytial Virus by PCR POSITIVE (A) NEGATIVE Final    Comment: (NOTE) Fact Sheet for Patients: bloggercourse.com  Fact Sheet for Healthcare Providers: seriousbroker.it  This test is not yet approved or cleared by the [place]  FDA and has been authorized for detection and/or diagnosis of SARS-CoV-2 by FDA under an Emergency Use Authorization (EUA). This EUA will remain in effect (meaning this test can be used) for the duration of the COVID-19 declaration under Section 564(b)(1) of the Act, 21 U.S.C. section 360bbb-3(b)(1), unless the authorization is terminated or revoked.  Performed at [facility], [address], [place], [place] [address]   Blood Culture (routine x 2)     Status: None (Preliminary result)   Collection Time: 02/21/23  7:49 PM   Specimen: BLOOD  Result Value Ref Range Status   Specimen Description BLOOD RIGHT ANTECUBITAL  Final   Special Requests   Final    BOTTLES DRAWN AEROBIC AND ANAEROBIC Blood Culture results may not be optimal due to an inadequate volume of blood received in culture bottles   Culture   Final    NO GROWTH < 12 HOURS Performed at [facility], [address] [place], [place] [address]    Report Status PENDING  Incomplete  Blood Culture (routine x 2)     Status: None (Preliminary result)   Collection Time: 02/21/23 10:35 PM   Specimen: BLOOD  Result Value Ref Range Status   Specimen Description BLOOD LEFT ASSIST CONTROL  Final   Special Requests   Final    BOTTLES DRAWN AEROBIC AND ANAEROBIC Blood Culture results may not be optimal due to an inadequate volume of blood received in culture bottles   Culture   Final    NO GROWTH < 12 HOURS Performed at [facility], [address], [place], [place] [address]    Report Status PENDING  Incomplete     Time coordinating discharge: Over 30 minutes  SIGNED:   Anthony CHRISTELLA Pouch,  MD  [facility] 02/22/2023, 3:33 PM Pager   If 7PM-7AM, please contact night-coverage

## 2023-02-22 NOTE — ED Notes (Signed)
 Pt continues to go back and forth about wanting to leave AMA.  In this writer's opinion, the Pt doesn't understand fully that he is feeling better because he is wearing 4L [place].  Pt informed he will start feeling worse if when we remove the oxygen.

## 2023-02-22 NOTE — Assessment & Plan Note (Signed)
 Per chart review, patient has a history of polysubstance abuse, predominantly alcohol and cocaine.  No history of alcohol withdrawal, however cocaine use may be contributing to tachycardia.

## 2023-02-22 NOTE — ED Notes (Signed)
 Pt contact made. Pt is CAOx4 and appears to be in respiratory distress and stating "I can't breath". Pt was placed on monitor and vitals were assessed. Pt's Sp02 was at 88%; pt placed on nasal cannula at 2L at this time.

## 2023-02-22 NOTE — ED Notes (Signed)
 Pt was found standing in room.  Both IV had been ripped out and blood all over bed, floor, and sink.  Pt and room cleaned.  New IV started.

## 2023-02-22 NOTE — Assessment & Plan Note (Signed)
-  Resume home Eliquis

## 2023-02-22 NOTE — ED Triage Notes (Signed)
 Pt here POV for same things as yesterday. Pt c/o of SOB when walking to car. Pt left b/c of long wait.

## 2023-02-22 NOTE — Assessment & Plan Note (Signed)
 A1c obtained earlier today elevated at 12.6%.  Presenting with significant hyperglycemia but no evidence of hyperglycemic crisis.  - S/p 10 units of IV insulin - Start SSI, resistant - Semglee 20 units at bedtime - Hold home regimen

## 2023-02-22 NOTE — Assessment & Plan Note (Signed)
 Patient is presenting with peripheral edema only normal BNP. Last echocardiogram in May 2024 demonstrated preserved EF at 50-55%, grade 1 diastolic dysfunction, however moderately reduced RV function.  - Daily weights - Strict in and out - Hold off on Lasix  at this time

## 2023-02-22 NOTE — ED Provider Triage Note (Signed)
 Emergency Medicine Provider Triage Evaluation Note  Henry Munoz , a 58 y.o. male  was evaluated in triage.  Pt complains of sob, was seen here and admitted overnight was on bipap, left ama but now realizes the doctor is right and he doesn't have enough wind to even walk to the bus stop.  Review of Systems  Positive:  Negative:   Physical Exam  BP (!) 172/94   Pulse (!) 115   Temp 98.2 F (36.8 C) (Oral)   Resp (!) 40   SpO2 90%  Gen:   Awake, no distress   Resp:  Normal effort  MSK:   Moves extremities without difficulty  Other:    Medical Decision Making  Medically screening exam initiated at 3:56 PM.  Appropriate orders placed.  Henry Munoz was informed that the remainder of the evaluation will be completed by another provider, this initial triage assessment does not replace that evaluation, and the importance of remaining in the ED until their evaluation is complete.     Gasper Devere ORN, PA-C 02/22/23 1557

## 2023-02-22 NOTE — ED Notes (Signed)
 Per Dorene Grebe RN, Pt has removed BiPAP.  RT has been called to re-evaluated Pt.

## 2023-02-22 NOTE — ED Notes (Signed)
 Signature pad does not work.  Paper AMA form completed and given to nurse secretary.

## 2023-02-22 NOTE — ED Notes (Addendum)
 Pt informed his bed is being cleaned.  Pt reports I don't want to be admitted.  I'm just waiting on you to take this stuff off.  Pt was, again, educated his oxygen is around 85% on room air and he will probably get worse if he goes home.  Pt reports he doesn't care and wants to go home.  Pt encouraged to stay, but is adamant about going home.     This clinical research associate has already spoken to [employer] and she reports she has already gone of the issue of AMA with the Pt.

## 2023-02-22 NOTE — ED Notes (Signed)
 Pt found to have removed all monitoring equipment.  O2 sat 87% RA.  Pt placed on 4L [place] and O2 increased to 94%.  Pt reminded he is being admitted to the hospital and must keep monitoring equipment on.

## 2023-02-22 NOTE — H&P (Signed)
 History and Physical    Patient: Henry Munoz FMW:[id number] DOB: 1965/08/07 DOA: 02/22/2023 DOS: the patient was seen and examined on 02/22/2023 PCP: [facility]  Patient coming from: Home  Chief Complaint:  Chief Complaint  Patient presents with   Shortness of Breath   HPI: Henry Munoz is a 58 y.o. male with medical history significant of COPD, polysubstance abuse including alcohol and cocaine, type 2 diabetes, HFpEF, hypertension, schizoaffective disorder, PE on Eliquis , CVA, who presents to the ED due to shortness of breath.  Henry Munoz states that he was just admitted for similar symptoms and went home earlier today AGAINST MEDICAL ADVICE.  Once he arrived home, he realized he could not breathe and due to this, return to the ED.  He denies any new symptoms.  He notes that he is having some lower extremity swelling and feels as though he is having congestion in his chest but denies any chest pain.  Per H&P from yesterday, patient presented with shortness of breath with productive cough and wheezing for 4 days.  He was experiencing fever, chills.  ED course: On arrival to the ED, patient was hypertensive at 168/90 with heart rate of 110.  He was saturating at 88% on room air with tachypnea at 40/minute.  Initial workup notable for normal CBC, sodium 134, bicarb 23, glucose 476, creatinine 1.20 with GFR above 60.  BNP and troponin within normal limits.  Chest x-ray with no active disease.  Patient started on DuoNebs, Solu-Medrol , and insulin .  TRH contacted for admission.  Review of Systems: As mentioned in the history of present illness. All other systems reviewed and are negative.  Past Medical History:  Diagnosis Date   Alcohol abuse    Cocaine abuse (HCC)    Diabetes mellitus without complication (HCC)    Diastolic dysfunction    a. 06/2022 Echo (in setting of acute PE):  EF 50-55%, no rwma, mod-sev LVH, grI DD, mod reduced RV fxn, triv MR w/ MVP of  post leaflet, ? bicuspid AoV w/ triv AI and Ao sclerosis.   Hypertension    Pulmonary embolism (HCC)    a. 06/2022 CTA: Large volume PE affecting all lobar braches w/ R heart strain-->s/p mech thrombectomy.   Schizo-affective schizophrenia (HCC)    Stroke [facility])    TIA (transient ischemic attack)    Tobacco abuse    Past Surgical History:  Procedure Laterality Date   PULMONARY THROMBECTOMY Bilateral 06/30/2022   Procedure: PULMONARY THROMBECTOMY;  Surgeon: Jama Cordella MATSU, MD;  Location: [facility] INVASIVE CV LAB;  Service: Cardiovascular;  Laterality: Bilateral;   Social History:  reports that he has been smoking cigarettes. He has a 7.5 pack-year smoking history. He has never used smokeless tobacco. He reports current alcohol use of about 2.0 standard drinks of alcohol per week. He reports current drug use. Drug: Cocaine.  Allergies  Allergen Reactions   Haldol  [Haloperidol  Lactate]     Family History  Problem Relation Age of Onset   High blood pressure Father    Diabetes Father    Renal Disease Father     Prior to Admission medications   Medication Sig Start Date End Date Taking? Authorizing Provider  albuterol  (VENTOLIN  HFA) 108 (90 Base) MCG/ACT inhaler Inhale 2-4 puffs into the lungs every 4 (four) hours as needed for wheezing or shortness of breath. 01/27/23   Suzanne Kirsch, MD  amLODipine  (NORVASC ) 10 MG tablet Take 1 tablet (10 mg total) by mouth daily. 07/21/22 08/20/22  Massengill, Rankin, MD  apixaban  (ELIQUIS ) 5 MG TABS tablet Take 1 tablet (5 mg total) by mouth every 12 (twelve) hours. 07/20/22 08/19/22  Massengill, Rankin, MD  ARIPiprazole  (ABILIFY ) 15 MG tablet Take 1 tablet (15 mg total) by mouth daily. 07/21/22 08/20/22  Massengill, Rankin, MD  atorvastatin  (LIPITOR) 40 MG tablet Take 1 tablet (40 mg total) by mouth daily. Patient not taking: Reported on 02/22/2023 07/20/22   Johny Rankin, MD  Blood Glucose Monitoring Suppl DEVI 1 each by Does not apply route 3 (three)  times daily. May dispense any manufacturer covered by patient's insurance. 07/28/22   Poggi, Jenna E, PA-C  clopidogrel  (PLAVIX ) 75 MG tablet Take 75 mg by mouth daily. Patient not taking: Reported on 02/22/2023    [provider]  gabapentin  (NEURONTIN ) 300 MG capsule Take 1 capsule (300 mg total) by mouth 3 (three) times daily as needed (back/leg pain). Patient not taking: Reported on 02/22/2023 11/06/22 11/06/23  Floy Roberts, MD  gabapentin  (NEURONTIN ) 600 MG tablet Take 600 mg by mouth 3 (three) times daily. 11/18/22   [provider]  Glucose Blood (BLOOD GLUCOSE TEST STRIPS) STRP 1 each by Does not apply route 3 (three) times daily. Use as directed to check blood sugar. May dispense any manufacturer covered by patient's insurance and fits patient's device. 07/28/22   Poggi, Jenna E, PA-C  hydrOXYzine  (ATARAX ) 25 MG tablet Take 1 tablet (25 mg total) by mouth 3 (three) times daily as needed for anxiety. Patient not taking: Reported on 02/22/2023 07/20/22   Johny Rankin, MD  insulin  aspart (NOVOLOG ) 100 UNIT/ML FlexPen If eating and Blood Glucose (BG) 80 or higher inject 4 units for meal coverage and add correction dose per scale. If not eating, correction dose only. BG <150= 0 unit; BG 150-200= 1 unit; BG 201-250= 2 unit; BG 251-300= 3 unit; BG 301-350= 4 unit; BG 351-400= 5 unit; BG >400= 6 unit and Call Primary care. 07/28/22   Poggi, Jenna E, PA-C  insulin  glargine (LANTUS ) 100 UNIT/ML Solostar Pen Inject 20 Units into the skin daily. May substitute as needed per insurance. 07/28/22   Poggi, Jenna E, PA-C  Insulin  Pen Needle (PEN NEEDLES) 31G X 5 MM MISC 1 each by Does not apply route 3 (three) times daily. May dispense any manufacturer covered by patient's insurance. 07/28/22   Poggi, Jenna E, PA-C  Lancet Device MISC 1 each by Does not apply route 3 (three) times daily. May dispense any manufacturer covered by patient's insurance. 07/28/22   Poggi, Jenna E, PA-C  Lancets MISC  1 each by Does not apply route 3 (three) times daily. Use as directed to check blood sugar. May dispense any manufacturer covered by patient's insurance and fits patient's device. Patient not taking: Reported on 07/11/2022 06/30/22   Josette Ade, MD  nicotine  (NICODERM CQ  - DOSED IN MG/24 HOURS) 14 mg/24hr patch Place 1 patch (14 mg total) onto the skin daily. 07/21/22   Massengill, Rankin, MD  OZEMPIC, 2 MG/DOSE, 8 MG/3ML SOPN Inject 1 Dose into the skin every 7 (seven) days. 01/31/23   [provider]  traZODone  (DESYREL ) 50 MG tablet Take 1 tablet (50 mg total) by mouth at bedtime as needed for sleep. Patient not taking: Reported on 02/22/2023 07/20/22   Johny Rankin, MD    Physical Exam: Vitals:   02/22/23 1550 02/22/23 1557 02/22/23 1839 02/22/23 1840  BP: (!) 172/94  (!) 168/90   Pulse: (!) 115  (!) 110   Resp: (!) 40 ROLLEN)  30 (!) 24   Temp: 98.2 F (36.8 C)  98 F (36.7 C)   TempSrc: Oral  Oral   SpO2: 90%  93%   Weight:    117.9 kg  Height:    5' 7 (1.702 m)   Physical Exam Vitals and nursing note reviewed.  Constitutional:      General: He is not in acute distress.    Appearance: He is obese.  HENT:     Head: Normocephalic and atraumatic.  Cardiovascular:     Rate and Rhythm: Regular rhythm. Tachycardia present.  Pulmonary:     Effort: Tachypnea and accessory muscle usage present.     Breath sounds: Wheezing (Diffuse wheezing throughout) present. No rhonchi or rales.  Abdominal:     Palpations: Abdomen is soft.     Tenderness: There is no abdominal tenderness.  Musculoskeletal:     Right lower leg: 1+ Pitting Edema present.     Left lower leg: 1+ Pitting Edema present.  Skin:    General: Skin is warm and dry.  Neurological:     General: No focal deficit present.     Mental Status: He is alert and oriented to person, place, and time.  Psychiatric:        Mood and Affect: Mood normal.        Behavior: Behavior normal.    Data Reviewed: CBC with  WBC of 6.0, hemoglobin of 13.0, platelets of 213 BMP with sodium of 134, potassium 4.3, bicarb 23, glucose 476, BUN 22, creatinine 1.20 GFR above 60 BNP 32 Troponin 16  EKG personal reviewed.  Sinus rhythm with rate of 117.  Multiple PVCs.  No ischemic appearing changes.  DG Chest 2 View Result Date: 02/22/2023 CLINICAL DATA:  Shortness of breath EXAM: CHEST - 2 VIEW COMPARISON:  02/21/2023 FINDINGS: Lungs are clear. Heart size and mediastinal contours are within normal limits. No effusion. Visualized bones unremarkable. IMPRESSION: No acute cardiopulmonary disease. Electronically Signed   By: JONETTA Faes M.D.   On: 02/22/2023 16:37   Results are pending, will review when available.  Assessment and Plan:  * Acute hypoxic respiratory failure (HCC) Patient is presenting with continued shortness of breath and acute hypoxic respiratory failure in the setting of COPD exacerbation secondary to RSV.  - Continue supplemental oxygen to maintain oxygen saturation above 88% - If worsening tachypnea, will trial BiPAP - Wean as tolerated  COPD exacerbation (HCC) Secondary to RSV.  - S/p Solu-Medrol  125 mg once - Start prednisone  40 mg tomorrow to complete a 5-day course - DuoNebs every 6 hours - Pulmicort  twice daily - Pulmonary toilet  Uncontrolled type 2 diabetes mellitus with hyperglycemia, with long-term current use of insulin  (HCC) A1c obtained earlier today elevated at 12.6%.  Presenting with significant hyperglycemia but no evidence of hyperglycemic crisis.  - S/p 10 units of IV insulin  - Start SSI, resistant - Semglee  20 units at bedtime - Hold home regimen  Polysubstance abuse (HCC) Per chart review, patient has a history of polysubstance abuse, predominantly alcohol and cocaine.  No history of alcohol withdrawal, however cocaine use may be contributing to tachycardia.  Essential hypertension - Resume home regimen  (HFpEF) heart failure with preserved ejection fraction  [facility]) Patient is presenting with peripheral edema only normal BNP. Last echocardiogram in May 2024 demonstrated preserved EF at 50-55%, grade 1 diastolic dysfunction, however moderately reduced RV function.  - Daily weights - Strict in and out - Hold off on Lasix  at this time  History  of pulmonary embolism - Resume home Eliquis   Advance Care Planning:   Code Status: Full Code   Consults: None  Family Communication: No family at bedside  Severity of Illness: The appropriate patient status for this patient is INPATIENT. Inpatient status is judged to be reasonable and necessary in order to provide the required intensity of service to ensure the patient's safety. The patient's presenting symptoms, physical exam findings, and initial radiographic and laboratory data in the context of their chronic comorbidities is felt to place them at high risk for further clinical deterioration. Furthermore, it is not anticipated that the patient will be medically stable for discharge from the hospital within 2 midnights of admission.   * I certify that at the point of admission it is my clinical judgment that the patient will require inpatient hospital care spanning beyond 2 midnights from the point of admission due to high intensity of service, high risk for further deterioration and high frequency of surveillance required.*  Author: Clayborne Broom, MD 02/22/2023 8:41 PM  For on call review [email or web address].

## 2023-02-22 NOTE — Assessment & Plan Note (Signed)
-   Resume home regimen

## 2023-02-22 NOTE — ED Notes (Signed)
 Called CCMD and added pt to monitoring board

## 2023-02-22 NOTE — ED Provider Notes (Signed)
 [facility] Provider Note    Event Date/Time   First MD Initiated Contact with Patient 02/22/23 8177     (approximate)  History   Chief Complaint: Shortness of Breath  HPI  Henry Munoz is a 58 y.o. male with a past medical history of alcohol abuse, substance use, diabetes, schizophrenia, COPD, presents to the emergency department for worsening shortness of breath.  Patient was admitted to the hospital yesterday for hypoxia shortness of breath likely COPD exacerbation.  Patient left AGAINST MEDICAL ADVICE early this morning.  Patient states he could not tolerate going home and states that shortness of breath has worsened so he returned back to the emergency department.  Denies any chest pain or abdominal pain.  Patient currently satting tween 88 and 90% on room air during my evaluation.  Placed on 2 L nasal cannula.  Physical Exam   Triage Vital Signs: ED Triage Vitals  Encounter Vitals Group     BP 02/22/23 1550 (!) 172/94     Systolic BP Percentile --      Diastolic BP Percentile --      Pulse Rate 02/22/23 1550 (!) 115     Resp 02/22/23 1550 (!) 40     Temp 02/22/23 1550 98.2 F (36.8 C)     Temp Source 02/22/23 1550 Oral     SpO2 02/22/23 1550 90 %     Weight 02/22/23 1840 259 lb 14.8 oz (117.9 kg)     Height 02/22/23 1840 5' 7 (1.702 m)     Head Circumference --      Peak Flow --      Pain Score 02/22/23 1548 0     Pain Loc --      Pain Education --      Exclude from Growth Chart --     Most recent vital signs: Vitals:   02/22/23 1557 02/22/23 1839  BP:  (!) 168/90  Pulse:  (!) 110  Resp: (!) 30 (!) 24  Temp:  98 F (36.7 C)  SpO2:  93%    General: Awake, no distress.  CV:  Good peripheral perfusion.  Regular rate and rhythm  Resp:  Tachypneic around 30 breaths/min.  Decreased air movement bilaterally, slight expiratory wheeze. Abd:  No distention.  Soft, nontender.  No rebound or guarding. Other:  1+ lower extreme edema  bilaterally which the patient states is chronic and unchanged.  ED Results / Procedures / Treatments   EKG  EKG viewed and interpreted by myself shows sinus tachycardia 117 bpm the narrow QRS, left axis deviation, largely normal intervals and nonspecific ST changes.  RADIOLOGY  I have reviewed and interpreted chest x-ray images.  No consolidation seen on my evaluation. Radiology has read the x-ray as negative.   MEDICATIONS ORDERED IN ED: Medications  albuterol  (VENTOLIN  HFA) 108 (90 Base) MCG/ACT inhaler 2 puff (has no administration in time range)  ipratropium-albuterol  (DUONEB) 0.5-2.5 (3) MG/3ML nebulizer solution 3 mL (has no administration in time range)  ipratropium-albuterol  (DUONEB) 0.5-2.5 (3) MG/3ML nebulizer solution 3 mL (has no administration in time range)  methylPREDNISolone  sodium succinate (SOLU-MEDROL ) 125 mg/2 mL injection 125 mg (has no administration in time range)  insulin  aspart (novoLOG ) injection 10 Units (has no administration in time range)     IMPRESSION / MDM / ASSESSMENT AND PLAN / ED COURSE  I reviewed the triage vital signs and the nursing notes.  Patient's presentation is most consistent with acute presentation with potential threat to  life or bodily function.  Patient presents to the emergency department for worsening shortness of breath.  Patient left AGAINST MEDICAL ADVICE earlier this morning from the hospital after being admitted yesterday for COPD exacerbation/shortness of breath.  Patient still satting in the upper 80s on room air placed on 2 L nasal cannula.  Patient CBC is reassuring, chemistry does show hyperglycemia with a glucose of 476 with a slight anion gap elevation of 17.  Will dose IV insulin  and continue to closely monitor.  We will dose DuoNebs x 2 as well as Solu-Medrol .  Will discuss with hospitalist to readmit the patient.  CRITICAL CARE Performed by: Franky Moores   Total critical care time: 30 minutes  Critical care  time was exclusive of separately billable procedures and treating other patients.  Critical care was necessary to treat or prevent imminent or life-threatening deterioration.  Critical care was time spent personally by me on the following activities: development of treatment plan with patient and/or surrogate as well as nursing, discussions with consultants, evaluation of patient's response to treatment, examination of patient, obtaining history from patient or surrogate, ordering and performing treatments and interventions, ordering and review of laboratory studies, ordering and review of radiographic studies, pulse oximetry and re-evaluation of patient's condition.   FINAL CLINICAL IMPRESSION(S) / ED DIAGNOSES   Dyspnea COPD exacerbation Hypoxia   Note:  This document was prepared using Dragon voice recognition software and may include unintentional dictation errors.   Moores Franky, MD 02/22/23 [phone]

## 2023-02-22 NOTE — ED Notes (Signed)
 Pt removes bipap mask in room 10 and is on cellphone stating "I need to call for food"

## 2023-02-22 NOTE — Assessment & Plan Note (Signed)
 Patient is presenting with continued shortness of breath and acute hypoxic respiratory failure in the setting of COPD exacerbation secondary to RSV.  - Continue supplemental oxygen to maintain oxygen saturation above 88% - If worsening tachypnea, will trial BiPAP - Wean as tolerated

## 2023-02-23 ENCOUNTER — Other Ambulatory Visit (HOSPITAL_COMMUNITY): Payer: Self-pay

## 2023-02-23 DIAGNOSIS — J9601 Acute respiratory failure with hypoxia: Secondary | ICD-10-CM | POA: Diagnosis not present

## 2023-02-23 LAB — BASIC METABOLIC PANEL
Anion gap: 11 (ref 5–15)
BUN: 22 mg/dL — ABNORMAL HIGH (ref 6–20)
CO2: 26 mmol/L (ref 22–32)
Calcium: 8.9 mg/dL (ref 8.9–10.3)
Chloride: 98 mmol/L (ref 98–111)
Creatinine, Ser: 0.83 mg/dL (ref 0.61–1.24)
GFR, Estimated: >60 mL/min (ref >60)
Glucose, Bld: 314 mg/dL — ABNORMAL HIGH (ref 70–99)
Potassium: 4.4 mmol/L (ref 3.5–5.1)
Sodium: 135 mmol/L (ref 135–145)

## 2023-02-23 LAB — CBC WITH DIFFERENTIAL/PLATELET
Abs Immature Granulocytes: 0.02 10*3/uL (ref 0.00–0.07)
Basophils Absolute: 0 10*3/uL (ref 0.0–0.1)
Basophils Relative: 0 %
Eosinophils Absolute: 0 10*3/uL (ref 0.0–0.5)
Eosinophils Relative: 0 %
HCT: 40.1 % (ref 39.0–52.0)
Hemoglobin: 12.8 g/dL — ABNORMAL LOW (ref 13.0–17.0)
Immature Granulocytes: 0 %
Lymphocytes Relative: 10 %
Lymphs Abs: 0.7 10*3/uL (ref 0.7–4.0)
MCH: 30.5 pg (ref 26.0–34.0)
MCHC: 31.9 g/dL (ref 30.0–36.0)
MCV: 95.7 fL (ref 80.0–100.0)
Monocytes Absolute: 0.5 10*3/uL (ref 0.1–1.0)
Monocytes Relative: 6 %
Neutro Abs: 5.9 10*3/uL (ref 1.7–7.7)
Neutrophils Relative %: 84 %
Platelets: 201 10*3/uL (ref 150–400)
RBC: 4.19 MIL/uL — ABNORMAL LOW (ref 4.22–5.81)
RDW: 13.9 % (ref 11.5–15.5)
WBC: 7.1 10*3/uL (ref 4.0–10.5)
nRBC: 0 % (ref 0.0–0.2)

## 2023-02-23 LAB — CBG MONITORING, ED
Glucose-Capillary: 269 mg/dL — ABNORMAL HIGH (ref 70–99)
Glucose-Capillary: 287 mg/dL — ABNORMAL HIGH (ref 70–99)
Glucose-Capillary: 297 mg/dL — ABNORMAL HIGH (ref 70–99)
Glucose-Capillary: 495 mg/dL — ABNORMAL HIGH (ref 70–99)

## 2023-02-23 MED ORDER — INSULIN GLARGINE-YFGN 100 UNIT/ML ~~LOC~~ SOLN
25.0000 [IU] | Freq: Every day | SUBCUTANEOUS | Status: DC
  Administered 2023-02-23: 25 [IU] via SUBCUTANEOUS
  Filled 2023-02-23: qty 0.25

## 2023-02-23 MED ORDER — HYDRALAZINE HCL 20 MG/ML IJ SOLN: 10.0000 mg | Freq: Four times a day (QID) | INTRAMUSCULAR | Status: DC | PRN

## 2023-02-23 NOTE — ED Notes (Signed)
 Pt resting in bed, pt positioned to eat breakfast and given warm blanket

## 2023-02-23 NOTE — TOC CM/SW Note (Addendum)
 Transition of Care [facility]) - Inpatient Brief Assessment   Patient Details  Name: Altan Bodley MRN: [id number] Date of Birth: 1965-11-17  Transition of Care [facility]) CM/SW Contact:    Tilmon Font, LCSW Phone Number: 02/23/2023, 3:37 PM   Clinical Narrative:  TOC met with pt at bedside to complete High Risk Readmission assessment; assessment is complete. No TOC needs at this time.   Admitted WUJ:[id number] Edema Admitted from: Home  [facility]  Pharmacy: [facility]  Current home health/prior home health/DME: [facility]   Transition of Care [facility]) - Inpatient Brief Assessment   Patient Details  Name: Sushil Aldi MRN: [id number] Date of Birth: Dec 11, 1965  Transition of Care [facility]) CM/SW Contact:    Tilmon Font, LCSW Phone Number: 02/23/2023, 3:38 PM   Clinical Narrative:                     Transition of Care Asessment:

## 2023-02-23 NOTE — ED Notes (Signed)
 Phlebotomy notified patient is a difficult blood draw; they would need to collect morning labs

## 2023-02-23 NOTE — Inpatient Diabetes Management (Addendum)
 Inpatient Diabetes Program Recommendations  AACE/ADA: New Consensus Statement on Inpatient Glycemic Control (2015)  Target Ranges:  Prepandial:   less than 140 mg/dL      Peak postprandial:   less than 180 mg/dL (1-2 hours)      Critically ill patients:  140 - 180 mg/dL    Latest Reference Range & Units 06/29/22 04:46 02/22/23 04:00  Hemoglobin A1C 4.8 - 5.6 % 14.0 (H) 12.6 (H)  314 mg/dl  (H): Data is abnormally high  Latest Reference Range & Units 02/22/23 04:00 02/22/23 15:58  Glucose 70 - 99 mg/dL 161 (H) 096 (H)  (H): Data is abnormally high  Latest Reference Range & Units 02/22/23 10:03 02/22/23 12:33 02/22/23 22:33  Glucose-Capillary 70 - 99 mg/dL   40 mg Solumedrol @0704  516 (HH)  18 units Novolog   20 units Semglee   421 (H)  10 units Novolog  @1906   125 mg Solumedrol @1905  362 (H)  20 units Semglee   (HH): Data is critically high (H): Data is abnormally high  Latest Reference Range & Units 02/23/23 07:34  Glucose-Capillary 70 - 99 mg/dL 045 (H)  11 units Novolog    (H): Data is abnormally high   Admitted 01/13 with Acute respiratory failure with hypoxia  Left AMA 01/14 around 3pm Back to ED with Same Issues 01/14 around 4pm  History: DM, COPD, Polysubstance abuse including alcohol and cocaine, HFpEF, Schizoaffective disorder, PE, CVA   Home DM Meds: Novolog  4 units TID with meals       Novolog  Correction Scale       Lantus  20 units Daily       Ozempic 1 mg Qweek  Current Orders: Novolog  Resistant Correction Scale/ SSI (0-20 units) TID AC       Semglee  20 units at bedtime      Prednisone  40 mg daily    [facility]  Diabetes RN counseled back in May 2024 when his A1c was 14% Multiple ED visits June, July 2024   Met w/ pt down in the ED this AM.  Pt told me he is taking Lantus  insulin  40 units BID + Novolog  20 units BID + Ozempic once weekly.  Stated adherence to meds at the beginning of our conversation.  Stated to me he has  a traditional fingerstick CBG meter and checks about 4 times per week.  States his CBGs are anywhere from 170-200.  I reviewed with pt that his glucose was 423 when he came to the ED yesterday and then 476 when he came back to the ED after leaving AMA.  Discussed with pt that we did give him steroids and this likely worsened his CBG control in the hospital.  Explained to pt that we are giving him basal and rapid-acting insulin  and oral Steroids now for his breathing.  Reviewed with pt his Last A1c of 14% back in May 2024--Pt told me his PCP Dr. Willene Harper with [facility] tested it a few weeks ago and it was down to 7%.  I discussed with pt that his A1c is now up to 12.6%--I reviewed healthy A1c and healthy CBG goals for home.  Several minutes into our conversation, pt then stated to me he ran out of insulin  about 2 months ago and tried to order more but couldn't get them--Pt had just got done telling me earlier he was taking his meds as prescribed--Unsure what to believe??  I checked with OP Pharmacy and pt has Medicaid and can get Insulins for $  4 co-pay for each insulin .  [facility] and [facility] in his chart.      --Will follow patient during hospitalization--  Langston Pippins RN, MSN, CDCES Diabetes Coordinator Inpatient Glycemic Control Team Team Pager: [phone] (8a-5p)

## 2023-02-23 NOTE — ED Notes (Signed)
 Dinner at bedside

## 2023-02-23 NOTE — ED Notes (Signed)
 Pt found standing at side of bed with pants down, pt agitated that he could not get out of bed. This RN educated pt about fall precautions and to try to be patient while nurse comes in

## 2023-02-23 NOTE — Progress Notes (Signed)
  PROGRESS NOTE    Henry Munoz  EXB:[id number] DOB: 30-Dec-1965 DOA: 02/22/2023 PCP: [facility]  ED15A/ED15A  LOS: 1 day   Brief hospital course:   Assessment & Plan: Henry Munoz is a 58 y.o. male with medical history significant of COPD, polysubstance abuse including alcohol and cocaine, type 2 diabetes, HFpEF, hypertension, schizoaffective disorder, PE on Eliquis , CVA, who presents to the ED due to shortness of breath.   Pt was just admitted for similar symptoms and left AGAINST MEDICAL ADVICE.    * Acute hypoxic respiratory failure (HCC) Patient is presenting with continued shortness of breath and acute hypoxic respiratory failure in the setting of COPD exacerbation secondary to RSV. --Continue supplemental O2 to keep sats >=90%, wean as tolerated  COPD exacerbation (HCC) Secondary to RSV.  Has diffuse wheezing. - S/p Solu-Medrol  125 mg once --cont prednisone  40 mg daily - DuoNebs every 6 hours - Pulmicort  twice daily - Pulmonary toilet  Uncontrolled type 2 diabetes mellitus with hyperglycemia, with long-term current use of insulin  (HCC) A1c 12.6%.  Presenting with significant hyperglycemia but no evidence of hyperglycemic crisis. --increase glargine to 25u nightly --ACHS and SSI  Polysubstance abuse (HCC) Cocaine and alcohol Per chart review, patient has a history of polysubstance abuse, predominantly alcohol and cocaine.  No history of alcohol withdrawal, however cocaine use may be contributing to tachycardia.  Essential hypertension --cont amlodipine   (HFpEF) heart failure with preserved ejection fraction [facility]) Patient is presenting with peripheral edema only normal BNP.   CXR clear.  Last echocardiogram in May 2024 demonstrated preserved EF at 50-55%, grade 1 diastolic dysfunction, however moderately reduced RV function.  History of pulmonary embolism --cont Eliquis    DVT prophylaxis: On:Eliquis  Code Status: Full code  Family  Communication:  Level of care: Telemetry Medical Dispo:   The patient is from: home Anticipated d/c is to: home Anticipated d/c date is: 2-3 days   Subjective and Interval History:  Pt reported breathing improved, still had cough with sputum production.   Objective: Vitals:   02/23/23 1030 02/23/23 1055 02/23/23 1210 02/23/23 1619  BP: (!) 153/84  (!) 165/95 (!) 155/95  Pulse: 68  (!) 105   Resp:   15   Temp:      TempSrc:      SpO2:  97%    Weight:      Height:       No intake or output data in the 24 hours ending 02/23/23 1802 Filed Weights   02/22/23 1840  Weight: 117.9 kg    Examination:   Constitutional: NAD, AAOx3 HEENT: conjunctivae and lids normal, EOMI CV: No cyanosis.   RESP: diffuse wheezing, on 2L O2 Extremities: edema in BLE SKIN: warm, dry Neuro: II - XII grossly intact.   Psych: Normal mood and affect.  Appropriate judgement and reason   Data Reviewed: I have personally reviewed labs and imaging studies  Time spent: 50 minutes  Garrison Kanner, MD [facility] If 7PM-7AM, please contact night-coverage 02/23/2023, 6:02 PM

## 2023-02-23 NOTE — ED Notes (Signed)
 Pt was found to be desatting on the monitor with SpO2 at 85%.Henry Munoz Upon entering the room pt was found to have oxygen out of his nose. This RN put oxygen back on and educated pt to keep oxygen on. Pt bounced back to 94% after a few deep breaths.

## 2023-02-24 DIAGNOSIS — J9601 Acute respiratory failure with hypoxia: Secondary | ICD-10-CM | POA: Diagnosis not present

## 2023-02-24 LAB — GLUCOSE, CAPILLARY
Glucose-Capillary: 242 mg/dL — ABNORMAL HIGH (ref 70–99)
Glucose-Capillary: 348 mg/dL — ABNORMAL HIGH (ref 70–99)

## 2023-02-24 LAB — CBG MONITORING, ED
Glucose-Capillary: 201 mg/dL — ABNORMAL HIGH (ref 70–99)
Glucose-Capillary: 205 mg/dL — ABNORMAL HIGH (ref 70–99)

## 2023-02-24 MED ORDER — IPRATROPIUM-ALBUTEROL 0.5-2.5 (3) MG/3ML IN SOLN
3.0000 mL | Freq: Four times a day (QID) | RESPIRATORY_TRACT | Status: DC
  Administered 2023-02-24 – 2023-02-25 (×4): 3 mL via RESPIRATORY_TRACT
  Filled 2023-02-24 (×5): qty 3

## 2023-02-24 MED ORDER — INSULIN ASPART 100 UNIT/ML IJ SOLN
4.0000 [IU] | Freq: Three times a day (TID) | INTRAMUSCULAR | Status: DC
  Administered 2023-02-24: 4 [IU] via SUBCUTANEOUS
  Filled 2023-02-24: qty 1

## 2023-02-24 MED ORDER — SODIUM CHLORIDE 3 % IN NEBU
4.0000 mL | INHALATION_SOLUTION | Freq: Two times a day (BID) | RESPIRATORY_TRACT | Status: DC
  Administered 2023-02-24: 4 mL via RESPIRATORY_TRACT
  Filled 2023-02-24 (×3): qty 4

## 2023-02-24 MED ORDER — INSULIN GLARGINE-YFGN 100 UNIT/ML ~~LOC~~ SOLN
30.0000 [IU] | Freq: Every day | SUBCUTANEOUS | Status: DC
  Administered 2023-02-24: 30 [IU] via SUBCUTANEOUS
  Filled 2023-02-24: qty 0.3

## 2023-02-24 NOTE — Inpatient Diabetes Management (Signed)
Inpatient Diabetes Program Recommendations  AACE/ADA: New Consensus Statement on Inpatient Glycemic Control (2015)  Target Ranges:  Prepandial:   less than 140 mg/dL      Peak postprandial:   less than 180 mg/dL (1-2 hours)      Critically ill patients:  140 - 180 mg/dL   Lab Results  Component Value Date   GLUCAP 201 (H) 02/24/2023   HGBA1C 12.6 (H) 02/22/2023    Admitted 01/13 with Acute respiratory failure with hypoxia  Left AMA 01/14 around 3pm Back to ED with Same Issues 01/14 around 4pm   History: DM, COPD, Polysubstance abuse including alcohol and cocaine, HFpEF, Schizoaffective disorder, PE, CVA    Home DM Meds: Novolog 4 units TID with meals                             Novolog Correction Scale                             Lantus 20 units Daily                             Ozempic 1 mg Qweek   Current Orders: Novolog Resistant Correction Scale/ SSI (0-20 units) TID AC                             Semglee 20 units at bedtime                            Prednisone 40 mg daily       PCP: [facility]   Diabetes RN counseled back in May 2024 when his A1c was 14% Multiple ED visits June, July 2024   In the setting of steroids consider adding Novolog 4 units TID (assuming patient consuming >50% of meals.  Thanks, Lujean Rave, MSN, RNC-OB Diabetes Coordinator [phone] (8a-5p)

## 2023-02-24 NOTE — Progress Notes (Signed)
  PROGRESS NOTE    Henry Munoz  [id number]:[id number] DOB: [birth year] DOA: 02/22/2023 PCP: [facility]  151A/151A-AA  LOS: 2 days   Brief hospital course:   Assessment & Plan: [name] is a 58 y.o. male with medical history significant of COPD, polysubstance abuse including alcohol and cocaine, type 2 diabetes, HFpEF, hypertension, schizoaffective disorder, PE on Eliquis, CVA, who presents to the ED due to shortness of breath.   Pt was just admitted for similar symptoms and left AGAINST MEDICAL ADVICE.    * Acute hypoxic respiratory failure (HCC) Patient is presenting with continued shortness of breath and acute hypoxic respiratory failure in the setting of COPD exacerbation secondary to RSV. --Continue supplemental O2 to keep sats >=92%, wean as tolerated  COPD exacerbation (HCC) Secondary to RSV.  Has diffuse wheezing. - S/p Solu-Medrol 125 mg once --cont prednisone 40 mg daily - DuoNebs every 6 hours - Pulmicort twice daily --hypertonic saline neb BID  Uncontrolled type 2 diabetes mellitus with hyperglycemia, with long-term current use of insulin (HCC) A1c 12.6%.  Presenting with significant hyperglycemia but no evidence of hyperglycemic crisis. --increase glargine to 30u daily --add mealtime 4u TID --ACHS and SSI  Polysubstance abuse (HCC) Cocaine and alcohol Per chart review, patient has a history of polysubstance abuse, predominantly alcohol and cocaine.  No history of alcohol withdrawal, however cocaine use may be contributing to tachycardia.  Essential hypertension --cont amlodipine  (HFpEF) heart failure with preserved ejection fraction [facility]) Patient is presenting with peripheral edema only normal BNP.   CXR clear.  Last echocardiogram in May 2024 demonstrated preserved EF at 50-55%, grade 1 diastolic dysfunction, however moderately reduced RV function.  History of pulmonary embolism --cont Eliquis   DVT prophylaxis: UK:GURKYHC Code  Status: Full code  Family Communication:  Level of care: Telemetry Medical Dispo:   The patient is from: home Anticipated d/c is to: home Anticipated d/c date is: 2-3 days   Subjective and Interval History:  Pt reported feeling better.  Still coughing.   Objective: Vitals:   02/24/23 1535 02/24/23 1536 02/24/23 1536 02/24/23 1617  BP: 138/85  138/85 (!) 149/91  Pulse: 94  94 94  Resp: (!) 22  (!) 22   Temp: 98.4 F (36.9 C)  98.4 F (36.9 C) 99.7 F (37.6 C)  TempSrc: Oral  Oral Oral  SpO2:  98% 98% 100%  Weight:      Height:        Intake/Output Summary (Last 24 hours) at 02/24/2023 1910 Last data filed at 02/24/2023 1647 Gross per 24 hour  Intake --  Output 220 ml  Net -220 ml   Filed Weights   02/22/23 1840  Weight: 117.9 kg    Examination:   Constitutional: NAD, AAOx3 HEENT: conjunctivae and lids normal, EOMI CV: No cyanosis.   RESP: audible wheezing, on Lequire Extremities: edema in BLE SKIN: warm, dry Neuro: II - XII grossly intact.   Psych: Normal mood and affect.  Appropriate judgement and reason   Data Reviewed: I have personally reviewed labs and imaging studies  Time spent: 35 minutes  Darlin Priestly, MD [facility] If 7PM-7AM, please contact night-coverage 02/24/2023, 7:10 PM

## 2023-02-25 DIAGNOSIS — J9601 Acute respiratory failure with hypoxia: Secondary | ICD-10-CM | POA: Diagnosis not present

## 2023-02-25 LAB — GLUCOSE, CAPILLARY
Glucose-Capillary: 89 mg/dL (ref 70–99)
Glucose-Capillary: 199 mg/dL — ABNORMAL HIGH (ref 70–99)
Glucose-Capillary: 241 mg/dL — ABNORMAL HIGH (ref 70–99)
Glucose-Capillary: 255 mg/dL — ABNORMAL HIGH (ref 70–99)

## 2023-02-25 MED ORDER — INSULIN ASPART 100 UNIT/ML IJ SOLN
10.0000 [IU] | Freq: Three times a day (TID) | INTRAMUSCULAR | Status: DC
  Administered 2023-02-25 – 2023-02-26 (×3): 10 [IU] via SUBCUTANEOUS
  Filled 2023-02-25 (×4): qty 1

## 2023-02-25 MED ORDER — INSULIN GLARGINE-YFGN 100 UNIT/ML ~~LOC~~ SOLN
25.0000 [IU] | Freq: Every day | SUBCUTANEOUS | Status: DC
  Administered 2023-02-25: 25 [IU] via SUBCUTANEOUS
  Filled 2023-02-25 (×2): qty 0.25

## 2023-02-25 MED ORDER — IPRATROPIUM-ALBUTEROL 0.5-2.5 (3) MG/3ML IN SOLN
3.0000 mL | Freq: Four times a day (QID) | RESPIRATORY_TRACT | Status: DC
  Administered 2023-02-25 – 2023-02-26 (×4): 3 mL via RESPIRATORY_TRACT
  Filled 2023-02-25 (×4): qty 3

## 2023-02-25 MED ORDER — SODIUM CHLORIDE 3 % IN NEBU
4.0000 mL | INHALATION_SOLUTION | Freq: Two times a day (BID) | RESPIRATORY_TRACT | Status: DC
  Administered 2023-02-25 – 2023-02-26 (×2): 4 mL via RESPIRATORY_TRACT
  Filled 2023-02-25 (×3): qty 4

## 2023-02-25 NOTE — Plan of Care (Signed)

## 2023-02-25 NOTE — Progress Notes (Signed)
Patient has made a comment saying "I should have asked the kitchen to send him cyanide". Nurse responded with "cyanide? why do you need cyanide?"  Patient responded back saying "to take myself out". Nurse asked patient if he had those thoughts before and patient said no, just today. MD notified via secure chat of patient's comment, MD's response was why would he think kitchen would have cyanide? that's not a real plan.

## 2023-02-25 NOTE — Discharge Instructions (Addendum)

## 2023-02-25 NOTE — Progress Notes (Signed)
SATURATION QUALIFICATIONS: (This note is used to comply with regulatory documentation for home oxygen)  Patient Saturations on Room Air at Rest = 90%  Patient Saturations on Room Air while Ambulating = 86%  Patient Saturations on 5 Liters of oxygen while Ambulating = 93-95%  Please briefly explain why patient needs home oxygen: Patient desaturates below 88 % on room air when ambulating.

## 2023-02-25 NOTE — Progress Notes (Signed)
  PROGRESS NOTE    Henry Munoz  [id number]:[id number] DOB: 31-Aug-1965 DOA: 02/22/2023 PCP: [facility]  151A/151A-AA  LOS: 3 days   Brief hospital course:   Assessment & Plan: Henry Munoz is a 58 y.o. male with medical history significant of COPD, polysubstance abuse including alcohol and cocaine, type 2 diabetes, HFpEF, hypertension, schizoaffective disorder, PE on Eliquis, CVA, who presents to the ED due to shortness of breath.   Pt was just admitted for similar symptoms and left AGAINST MEDICAL ADVICE.    * Acute hypoxic respiratory failure (HCC) Patient is presenting with continued shortness of breath and acute hypoxic respiratory failure in the setting of COPD exacerbation secondary to RSV. --today, ambulation test found Saturations on Room Air while Ambulating = 86%, currently on 5L O2. --Continue supplemental O2 to keep sats >=92%, wean as tolerated  COPD exacerbation (HCC) Secondary to RSV.  Has diffuse wheezing. - S/p Solu-Medrol 125 mg once --cont prednisone 40 mg daily - DuoNebs every 6 hours - Pulmicort twice daily --hypertonic saline neb BID to help clear mucus  Uncontrolled type 2 diabetes mellitus with hyperglycemia, with long-term current use of insulin (HCC) A1c 12.6%.  Presenting with significant hyperglycemia but no evidence of hyperglycemic crisis. --decrease glargine to 25u daily --increase mealtime to 10u TID --ACHS and SSI  Polysubstance abuse (HCC) Cocaine and alcohol Per chart review, patient has a history of polysubstance abuse, predominantly alcohol and cocaine.  No history of alcohol withdrawal, however cocaine use may be contributing to tachycardia.  Essential hypertension --cont amlodipine  (HFpEF) heart failure with preserved ejection fraction [facility]) Patient is presenting with peripheral edema only normal BNP.   CXR clear.  Last echocardiogram in May 2024 demonstrated preserved EF at 50-55%, grade 1 diastolic dysfunction,  however moderately reduced RV function.  History of pulmonary embolism --cont Eliquis   DVT prophylaxis: LO:VFIEPPI Code Status: Full code  Family Communication:  Level of care: Telemetry Medical Dispo:   The patient is from: home Anticipated d/c is to: home Anticipated d/c date is: 2-3 days   Subjective and Interval History:  Pt reported feeling much better, but said he couldn't bring up sputum and couldn't stop coughing with deep breaths.  Pt desat with walking.   Objective: Vitals:   02/25/23 0500 02/25/23 0828 02/25/23 0905 02/25/23 1525  BP:  (!) 171/107 (!) 154/97 (!) 143/97  Pulse:  87 89 (!) 111  Resp:  20  18  Temp:  98.5 F (36.9 C)  98.4 F (36.9 C)  TempSrc:      SpO2:  99%  97%  Weight: 118 kg     Height:        Intake/Output Summary (Last 24 hours) at 02/25/2023 1959 Last data filed at 02/25/2023 1430 Gross per 24 hour  Intake 248 ml  Output --  Net 248 ml   Filed Weights   02/22/23 1840 02/25/23 0500  Weight: 117.9 kg 118 kg    Examination:   Constitutional: NAD, AAOx3 HEENT: conjunctivae and lids normal, EOMI CV: No cyanosis.   RESP: diffuse loud wheezing and rhonchi, cough, on 5L Neuro: II - XII grossly intact.     Data Reviewed: I have personally reviewed labs and imaging studies  Time spent: 35 minutes  Darlin Priestly, MD [facility] If 7PM-7AM, please contact night-coverage 02/25/2023, 7:59 PM

## 2023-02-25 NOTE — Plan of Care (Signed)
Patient alert and oriented. No acute distress noted. Patient continues on droplet precautions. All needs attended to. Will continue to monitor.   Problem: Education: Goal: Ability to describe self-care measures that may prevent or decrease complications (Diabetes Survival Skills Education) will improve Outcome: Progressing Goal: Individualized Educational Video(s) Outcome: Progressing   Problem: Coping: Goal: Ability to adjust to condition or change in health will improve Outcome: Progressing   Problem: Fluid Volume: Goal: Ability to maintain a balanced intake and output will improve Outcome: Progressing   Problem: Health Behavior/Discharge Planning: Goal: Ability to identify and utilize available resources and services will improve Outcome: Progressing Goal: Ability to manage health-related needs will improve Outcome: Progressing   Problem: Metabolic: Goal: Ability to maintain appropriate glucose levels will improve Outcome: Progressing   Problem: Nutritional: Goal: Maintenance of adequate nutrition will improve Outcome: Progressing Goal: Progress toward achieving an optimal weight will improve Outcome: Progressing   Problem: Skin Integrity: Goal: Risk for impaired skin integrity will decrease Outcome: Progressing   Problem: Tissue Perfusion: Goal: Adequacy of tissue perfusion will improve Outcome: Progressing   Problem: Education: Goal: Knowledge of General Education information will improve Description: Including pain rating scale, medication(s)/side effects and non-pharmacologic comfort measures Outcome: Progressing   Problem: Health Behavior/Discharge Planning: Goal: Ability to manage health-related needs will improve Outcome: Progressing   Problem: Clinical Measurements: Goal: Ability to maintain clinical measurements within normal limits will improve Outcome: Progressing Goal: Will remain free from infection Outcome: Progressing Goal: Diagnostic test  results will improve Outcome: Progressing Goal: Respiratory complications will improve Outcome: Progressing Goal: Cardiovascular complication will be avoided Outcome: Progressing   Problem: Activity: Goal: Risk for activity intolerance will decrease Outcome: Progressing   Problem: Nutrition: Goal: Adequate nutrition will be maintained Outcome: Progressing   Problem: Coping: Goal: Level of anxiety will decrease Outcome: Progressing   Problem: Elimination: Goal: Will not experience complications related to bowel motility Outcome: Progressing Goal: Will not experience complications related to urinary retention Outcome: Progressing   Problem: Pain Managment: Goal: General experience of comfort will improve and/or be controlled Outcome: Progressing   Problem: Safety: Goal: Ability to remain free from injury will improve Outcome: Progressing   Problem: Skin Integrity: Goal: Risk for impaired skin integrity will decrease Outcome: Progressing   Problem: Education: Goal: Knowledge of disease or condition will improve Outcome: Progressing Goal: Knowledge of the prescribed therapeutic regimen will improve Outcome: Progressing Goal: Individualized Educational Video(s) Outcome: Progressing   Problem: Activity: Goal: Ability to tolerate increased activity will improve Outcome: Progressing Goal: Will verbalize the importance of balancing activity with adequate rest periods Outcome: Progressing   Problem: Respiratory: Goal: Ability to maintain a clear airway will improve Outcome: Progressing Goal: Levels of oxygenation will improve Outcome: Progressing Goal: Ability to maintain adequate ventilation will improve Outcome: Progressing

## 2023-02-26 DIAGNOSIS — J9601 Acute respiratory failure with hypoxia: Secondary | ICD-10-CM | POA: Diagnosis not present

## 2023-02-26 LAB — CULTURE, BLOOD (ROUTINE X 2)
Culture: NO GROWTH
Culture: NO GROWTH

## 2023-02-26 LAB — GLUCOSE, CAPILLARY
Glucose-Capillary: 197 mg/dL — ABNORMAL HIGH (ref 70–99)
Glucose-Capillary: 164 mg/dL — ABNORMAL HIGH (ref 70–99)

## 2023-02-26 MED ORDER — GUAIFENESIN ER 600 MG PO TB12: 600.0000 mg | ORAL_TABLET | Freq: Two times a day (BID) | ORAL | 0 refills | Status: AC

## 2023-02-26 MED ORDER — BLOOD GLUCOSE TEST VI STRP: 1.0000 | ORAL_STRIP | Freq: Three times a day (TID) | 2 refills | Status: AC

## 2023-02-26 MED ORDER — IPRATROPIUM-ALBUTEROL 0.5-2.5 (3) MG/3ML IN SOLN: 3.0000 mL | Freq: Four times a day (QID) | RESPIRATORY_TRACT | 2 refills | Status: DC | PRN

## 2023-02-26 MED ORDER — PEN NEEDLES 31G X 5 MM MISC: 1.0000 | Freq: Three times a day (TID) | 2 refills | Status: DC

## 2023-02-26 MED ORDER — INSULIN GLARGINE 100 UNIT/ML SOLOSTAR PEN: 25.0000 [IU] | PEN_INJECTOR | Freq: Every day | SUBCUTANEOUS | 1 refills | Status: DC

## 2023-02-26 MED ORDER — INSULIN ASPART 100 UNIT/ML FLEXPEN: PEN_INJECTOR | SUBCUTANEOUS | 1 refills | Status: DC

## 2023-02-26 MED ORDER — TRELEGY ELLIPTA 100-62.5-25 MCG/ACT IN AEPB: 1.0000 | INHALATION_SPRAY | Freq: Every day | RESPIRATORY_TRACT | 2 refills | Status: AC

## 2023-02-26 MED ORDER — LANCETS MISC: 1.0000 | Freq: Three times a day (TID) | 1 refills | Status: DC

## 2023-02-26 MED ORDER — LANCET DEVICE MISC: 1.0000 | Freq: Three times a day (TID) | 0 refills | Status: DC

## 2023-02-26 NOTE — TOC Transition Note (Addendum)
Transition of Care [facility]) - Discharge Note   Patient Details  Name: Henry Munoz MRN: [id number] Date of Birth: 1965-11-30  [facility] CM/SW Contact:  Liliana Cline, LCSW Phone Number: 02/26/2023, 1:13 PM   Clinical Narrative:    Patient is being discharged and needs a cab and a nebulizer per MD. CSW spoke with patient. Patient confirmed the home address in the chart. Patient requests the nebulizer be brought to his hospital room before discharge. Nebulizer referral made to [facility] with [employer] who is aware of DC today after it is delivered. Patient requests a cab home, does not have a ride or funds for the cab. [employer] Waiver being signed and placed in hard chart. Cab voucher given to RN to call after nebulizer is delivered.  3:25- Cheyenne Adas called for cab. They will pick up at [facility] and call 1A when they arrive - ETA 20 minutes. RN notified.  Final next level of care: Home/Self Care Barriers to Discharge: Barriers Resolved   Patient Goals and CMS Choice Patient states their goals for this hospitalization and ongoing recovery are:: to return home CMS Medicare.gov Compare Post Acute Care list provided to:: Patient Choice offered to / list presented to : Patient      Discharge Placement                  Name of family member notified: patient Patient and family notified of of transfer: 02/26/23  Discharge Plan and Services Additional resources added to the After Visit Summary for                  DME Arranged: Nebulizer machine DME Agency: [employer] Date DME Agency Contacted: 02/26/23   Representative spoke with at DME Agency: Selena Batten            Social Drivers of Health (SDOH) Interventions SDOH Screenings   Food Insecurity: No Food Insecurity (02/24/2023)  Housing: Patient Declined (02/24/2023)  Transportation Needs: Patient Declined (02/24/2023)  Utilities: Patient Declined (02/24/2023)  Alcohol Screen: Low Risk  (07/12/2022)  Financial  Resource Strain: Low Risk  (10/13/2017)  Physical Activity: Inactive (10/13/2017)  Social Connections: Somewhat Isolated (10/13/2017)  Stress: No Stress Concern Present (10/13/2017)  Tobacco Use: High Risk (02/22/2023)     Readmission Risk Interventions    02/23/2023    3:36 PM  Readmission Risk Prevention Plan  Transportation Screening Complete  Medication Review (RN Care Manager) Complete  PCP or Specialist appointment within 3-5 days of discharge Complete  SW Recovery Care/Counseling Consult Complete  Palliative Care Screening Not Applicable  Skilled Nursing Facility Not Applicable

## 2023-02-26 NOTE — Plan of Care (Signed)

## 2023-02-26 NOTE — Discharge Summary (Signed)
Physician Discharge Summary   Henry Munoz  male DOB: 1966/01/02  ZOX:[id number]  PCP: [facility]  Admit date: 02/22/2023 Discharge date: 02/26/2023  Admitted From: home Disposition:  home CODE STATUS: Full code   Hospital Course:  For full details, please see H&P, progress notes, consult notes and ancillary notes.  Briefly,  Henry Munoz is a 58 y.o. male with medical history significant of COPD, polysubstance abuse including alcohol and cocaine, type 2 diabetes, HFpEF, hypertension, schizoaffective disorder, PE on Eliquis, CVA, who presented to the ED due to shortness of breath.   Pt was just admitted for similar symptoms and left AGAINST MEDICAL ADVICE earlier the same day.   * Acute hypoxic respiratory failure (HCC) --presented with continued shortness of breath and acute hypoxic respiratory failure in the setting of COPD exacerbation secondary to RSV.  O2 sats 85% on room air on presentation, required 4-5L O2.   --prior to discharge, pt was weaned down to room air prior to discharge.   COPD exacerbation (HCC) Secondary to RSV.  Had diffuse wheezing. - S/p Solu-Medrol 125 mg once f/b prednisone 40 mg daily, finished a course fo steroid burst. --received scheduled DuoNeb and hypertonic saline neb BID to help clear mucus. --Pt was discharged on Trelegy per pt request   Uncontrolled type 2 diabetes mellitus with hyperglycemia, with long-term current use of insulin (HCC) A1c 12.6%.  Presenting with significant hyperglycemia but no evidence of hyperglycemic crisis. --Home glargine increased from 20u to 25u daily.  Continued mealtime and SSI per home regimen.  All insulin and supplies refilled.   Polysubstance abuse (HCC) Cocaine and alcohol Per chart review, patient has a history of polysubstance abuse, predominantly alcohol and cocaine.  No history of alcohol withdrawal, however cocaine use may be contributing to tachycardia.   Essential  hypertension --cont amlodipine   Chronic heart failure with preserved ejection fraction [facility] Patient is presenting with peripheral edema only, normal BNP.   CXR clear.  Last echocardiogram in May 2024 demonstrated preserved EF at 50-55%, grade 1 diastolic dysfunction, however moderately reduced RV function.   History of pulmonary embolism --cont Eliquis    Unless noted above, medications under "STOP" list are ones pt was not taking PTA.  Discharge Diagnoses:  Principal Problem:   Acute hypoxic respiratory failure (HCC) Active Problems:   COPD exacerbation (HCC)   Uncontrolled type 2 diabetes mellitus with hyperglycemia, with long-term current use of insulin (HCC)   Essential hypertension   Polysubstance abuse (HCC)   History of pulmonary embolism   (HFpEF) heart failure with preserved ejection fraction (HCC)   30 Day Unplanned Readmission Risk Score    Flowsheet Row ED to Hosp-Admission (Current) from 02/22/2023 in [facility] (1A)  30 Day Unplanned Readmission Risk Score (%) 37.36 Filed at 02/26/2023 1200       This score is the patient's risk of an unplanned readmission within 30 days of being discharged (0 -100%). The score is based on dignosis, age, lab data, medications, orders, and past utilization.   Low:  0-14.9   Medium: 15-21.9   High: 22-29.9   Extreme: 30 and above         Discharge Instructions:  Allergies as of 02/26/2023       Reactions   Haldol [haloperidol Lactate]         Medication List     STOP taking these medications    clopidogrel 75 MG tablet Commonly known as: PLAVIX  hydrOXYzine 25 MG tablet Commonly known as: ATARAX   traZODone 50 MG tablet Commonly known as: DESYREL       TAKE these medications    albuterol 108 (90 Base) MCG/ACT inhaler Commonly known as: VENTOLIN HFA Inhale 2-4 puffs into the lungs every 4 (four) hours as needed for wheezing or shortness of breath.   amLODipine 10 MG  tablet Commonly known as: NORVASC Take 1 tablet (10 mg total) by mouth daily.   apixaban 5 MG Tabs tablet Commonly known as: ELIQUIS Take 1 tablet (5 mg total) by mouth every 12 (twelve) hours.   ARIPiprazole 15 MG tablet Commonly known as: ABILIFY Take 1 tablet (15 mg total) by mouth daily.   atorvastatin 40 MG tablet Commonly known as: LIPITOR Take 1 tablet (40 mg total) by mouth daily.   Blood Glucose Monitoring Suppl Devi 1 each by Does not apply route 3 (three) times daily. May dispense any manufacturer covered by patient's insurance.   BLOOD GLUCOSE TEST STRIPS Strp 1 each by Does not apply route 3 (three) times daily. Use as directed to check blood sugar. May dispense any manufacturer covered by patient's insurance and fits patient's device.   gabapentin 600 MG tablet Commonly known as: NEURONTIN Take 600 mg by mouth 3 (three) times daily. What changed: Another medication with the same name was removed. Continue taking this medication, and follow the directions you see here.   guaiFENesin 600 MG 12 hr tablet Commonly known as: MUCINEX Take 1 tablet (600 mg total) by mouth 2 (two) times daily for 7 days.   insulin aspart 100 UNIT/ML FlexPen Commonly known as: NOVOLOG If eating and Blood Glucose (BG) 80 or higher inject 4 units for meal coverage and add correction dose per scale. If not eating, correction dose only. BG <150= 0 unit; BG 150-200= 1 unit; BG 201-250= 2 unit; BG 251-300= 3 unit; BG 301-350= 4 unit; BG 351-400= 5 unit; BG >400= 6 unit and Call Primary care.   insulin glargine 100 UNIT/ML Solostar Pen Commonly known as: LANTUS Inject 25 Units into the skin daily. Increased from 20 unit daily. What changed:  how much to take additional instructions   ipratropium-albuterol 0.5-2.5 (3) MG/3ML Soln Commonly known as: DUONEB Take 3 mLs by nebulization every 6 (six) hours as needed.   Lancet Device Misc 1 each by Does not apply route 3 (three) times daily. May  dispense any manufacturer covered by patient's insurance.   Lancets Misc 1 each by Does not apply route 3 (three) times daily. Use as directed to check blood sugar. May dispense any manufacturer covered by patient's insurance and fits patient's device.   nicotine 14 mg/24hr patch Commonly known as: NICODERM CQ - dosed in mg/24 hours Place 1 patch (14 mg total) onto the skin daily.   Ozempic (2 MG/DOSE) 8 MG/3ML Sopn Generic drug: Semaglutide (2 MG/DOSE) Inject 1 Dose into the skin every 7 (seven) days. What changed: Another medication with the same name was removed. Continue taking this medication, and follow the directions you see here.   Pen Needles 31G X 5 MM Misc 1 each by Does not apply route 3 (three) times daily. May dispense any manufacturer covered by patient's insurance.   Trelegy Ellipta 100-62.5-25 MCG/ACT Aepb Generic drug: Fluticasone-Umeclidin-Vilant Inhale 1 application  into the lungs daily.               Durable Medical Equipment  (From admission, onward)  Start     Ordered   02/26/23 1245  For home use only DME Nebulizer machine  Once       Question Answer Comment  Patient needs a nebulizer to treat with the following condition COPD (chronic obstructive pulmonary disease) (HCC)   Length of Need Lifetime   Additional equipment included Administration kit      02/26/23 1244             Follow-up Information     [facility] Follow up in 1 week(s).   Specialty: General Practice Contact information: [address] [place] [address] [phone]                 Allergies  Allergen Reactions   Haldol [Haloperidol Lactate]      The results of significant diagnostics from this hospitalization (including imaging, microbiology, ancillary and laboratory) are listed below for reference.   Consultations:   Procedures/Studies: DG Chest 2 View Result Date: 02/22/2023 CLINICAL DATA:  Shortness of  breath EXAM: CHEST - 2 VIEW COMPARISON:  02/21/2023 FINDINGS: Lungs are clear. Heart size and mediastinal contours are within normal limits. No effusion. Visualized bones unremarkable. IMPRESSION: No acute cardiopulmonary disease. Electronically Signed   By: Corlis Leak M.D.   On: 02/22/2023 16:37   DG Chest Portable 1 View Result Date: 02/21/2023 CLINICAL DATA:  Shortness of breath. History of COPD, TIA, hypertension, diabetes, respiratory distress for more than 2 days, productive cough. EXAM: PORTABLE CHEST 1 VIEW COMPARISON:  01/27/2023 FINDINGS: The heart size and mediastinal contours are within normal limits. Both lungs are clear. The visualized skeletal structures are unremarkable. IMPRESSION: No active disease. Electronically Signed   By: Burman Nieves M.D.   On: 02/21/2023 21:06      Labs: BNP (last 3 results) Recent Labs    06/29/22 0446 02/21/23 1949 02/22/23 1558  BNP 205.8* 387.4* 32.7   Basic Metabolic Panel: Recent Labs  Lab 02/21/23 1949 02/22/23 0400 02/22/23 1558 02/23/23 0648  NA 136 136 134* 135  K 3.9 4.6 4.3 4.4  CL 97* 99 94* 98  CO2 25 24 23 26   GLUCOSE 350* 423* 476* 314*  BUN 14 15 22* 22*  CREATININE 1.04 1.05 1.20 0.83  CALCIUM 8.9 8.8* 9.4 8.9   Liver Function Tests: Recent Labs  Lab 02/21/23 1949  AST 20  ALT 20  ALKPHOS 106  BILITOT 1.0  PROT 7.4  ALBUMIN 3.7   No results for input(s): "LIPASE", "AMYLASE" in the last 168 hours. No results for input(s): "AMMONIA" in the last 168 hours. CBC: Recent Labs  Lab 02/21/23 1949 02/22/23 0400 02/22/23 1558 02/23/23 0648  WBC 6.9 5.3 6.0 7.1  NEUTROABS 4.6  --   --  5.9  HGB 14.7 12.2* 13.0 12.8*  HCT 46.2 38.3* 41.4 40.1  MCV 97.9 96.5 97.6 95.7  PLT 205 188 213 201   Cardiac Enzymes: No results for input(s): "CKTOTAL", "CKMB", "CKMBINDEX", "TROPONINI" in the last 168 hours. BNP: Invalid input(s): "POCBNP" CBG: Recent Labs  Lab 02/25/23 1201 02/25/23 1653 02/25/23 2104  02/26/23 0851 02/26/23 1138  GLUCAP 199* 241* 255* 197* 164*   D-Dimer No results for input(s): "DDIMER" in the last 72 hours. Hgb A1c No results for input(s): "HGBA1C" in the last 72 hours. Lipid Profile No results for input(s): "CHOL", "HDL", "LDLCALC", "TRIG", "CHOLHDL", "LDLDIRECT" in the last 72 hours. Thyroid function studies No results for input(s): "TSH", "T4TOTAL", "T3FREE", "THYROIDAB" in the last 72 hours.  Invalid  input(s): "FREET3" Anemia work up No results for input(s): "VITAMINB12", "FOLATE", "FERRITIN", "TIBC", "IRON", "RETICCTPCT" in the last 72 hours. Urinalysis    Component Value Date/Time   COLORURINE STRAW (A) 11/06/2022 2114   APPEARANCEUR CLEAR (A) 11/06/2022 2114   LABSPEC 1.032 (H) 11/06/2022 2114   PHURINE 7.0 11/06/2022 2114   GLUCOSEU >=500 (A) 11/06/2022 2114   HGBUR NEGATIVE 11/06/2022 2114   BILIRUBINUR NEGATIVE 11/06/2022 2114   KETONESUR NEGATIVE 11/06/2022 2114   PROTEINUR NEGATIVE 11/06/2022 2114   NITRITE NEGATIVE 11/06/2022 2114   LEUKOCYTESUR NEGATIVE 11/06/2022 2114   Sepsis Labs Recent Labs  Lab 02/21/23 1949 02/22/23 0400 02/22/23 1558 02/23/23 0648  WBC 6.9 5.3 6.0 7.1   Microbiology Recent Results (from the past 240 hours)  Resp panel by RT-PCR (RSV, Flu A&B, Covid) Anterior Nasal Swab     Status: Abnormal   Collection Time: 02/21/23  7:49 PM   Specimen: Anterior Nasal Swab  Result Value Ref Range Status   SARS Coronavirus 2 by RT PCR NEGATIVE NEGATIVE Final    Comment: (NOTE) SARS-CoV-2 target nucleic acids are NOT DETECTED.  The SARS-CoV-2 RNA is generally detectable in upper respiratory specimens during the acute phase of infection. The lowest concentration of SARS-CoV-2 viral copies this assay can detect is 138 copies/mL. A negative result does not preclude SARS-Cov-2 infection and should not be used as the sole basis for treatment or other patient management decisions. A negative result may occur with  improper  specimen collection/handling, submission of specimen other than nasopharyngeal swab, presence of viral mutation(s) within the areas targeted by this assay, and inadequate number of viral copies(<138 copies/mL). A negative result must be combined with clinical observations, patient history, and epidemiological information. The expected result is Negative.  Fact Sheet for Patients:  BloggerCourse.com  Fact Sheet for Healthcare Providers:  SeriousBroker.it  This test is no t yet approved or cleared by the [place] FDA and  has been authorized for detection and/or diagnosis of SARS-CoV-2 by FDA under an Emergency Use Authorization (EUA). This EUA will remain  in effect (meaning this test can be used) for the duration of the COVID-19 declaration under Section 564(b)(1) of the Act, 21 U.S.C.section 360bbb-3(b)(1), unless the authorization is terminated  or revoked sooner.       Influenza A by PCR NEGATIVE NEGATIVE Final   Influenza B by PCR NEGATIVE NEGATIVE Final    Comment: (NOTE) The Xpert Xpress SARS-CoV-2/FLU/RSV plus assay is intended as an aid in the diagnosis of influenza from Nasopharyngeal swab specimens and should not be used as a sole basis for treatment. Nasal washings and aspirates are unacceptable for Xpert Xpress SARS-CoV-2/FLU/RSV testing.  Fact Sheet for Patients: BloggerCourse.com  Fact Sheet for Healthcare Providers: SeriousBroker.it  This test is not yet approved or cleared by the [place] FDA and has been authorized for detection and/or diagnosis of SARS-CoV-2 by FDA under an Emergency Use Authorization (EUA). This EUA will remain in effect (meaning this test can be used) for the duration of the COVID-19 declaration under Section 564(b)(1) of the Act, 21 U.S.C. section 360bbb-3(b)(1), unless the authorization is terminated or revoked.     Resp  Syncytial Virus by PCR POSITIVE (A) NEGATIVE Final    Comment: (NOTE) Fact Sheet for Patients: BloggerCourse.com  Fact Sheet for Healthcare Providers: SeriousBroker.it  This test is not yet approved or cleared by the [place] FDA and has been authorized for detection and/or diagnosis of SARS-CoV-2 by FDA under an Emergency Use Authorization (EUA).  This EUA will remain in effect (meaning this test can be used) for the duration of the COVID-19 declaration under Section 564(b)(1) of the Act, 21 U.S.C. section 360bbb-3(b)(1), unless the authorization is terminated or revoked.  Performed at [facility], [address] [place], [place] [address]   Blood Culture (routine x 2)     Status: None   Collection Time: 02/21/23  7:49 PM   Specimen: BLOOD  Result Value Ref Range Status   Specimen Description BLOOD RIGHT ANTECUBITAL  Final   Special Requests   Final    BOTTLES DRAWN AEROBIC AND ANAEROBIC Blood Culture results may not be optimal due to an inadequate volume of blood received in culture bottles   Culture   Final    NO GROWTH 5 DAYS Performed at [facility], [address] [place], [place] [address]    Report Status 02/26/2023 FINAL  Final  Blood Culture (routine x 2)     Status: None   Collection Time: 02/21/23 10:35 PM   Specimen: BLOOD  Result Value Ref Range Status   Specimen Description BLOOD LEFT ASSIST CONTROL  Final   Special Requests   Final    BOTTLES DRAWN AEROBIC AND ANAEROBIC Blood Culture results may not be optimal due to an inadequate volume of blood received in culture bottles   Culture   Final    NO GROWTH 5 DAYS Performed at [facility], [address], [place], [place] [address]    Report Status 02/26/2023 FINAL  Final     Total time spend on discharging this patient, including the last patient exam, discussing the hospital stay, instructions for ongoing care as it  relates to all pertinent caregivers, as well as preparing the medical discharge records, prescriptions, and/or referrals as applicable, is 35 minutes.    Darlin Priestly, MD  [facility] 02/26/2023, 12:59 PM

## 2023-02-28 ENCOUNTER — Other Ambulatory Visit (HOSPITAL_COMMUNITY): Payer: Self-pay

## 2024-01-22 ENCOUNTER — Emergency Department

## 2024-01-22 ENCOUNTER — Emergency Department: Admission: EM | Admit: 2024-01-22 | Discharge: 2024-01-22 | Disposition: A | Discharge status: Home / Self Care

## 2024-01-22 ENCOUNTER — Other Ambulatory Visit: Payer: Self-pay

## 2024-01-22 DIAGNOSIS — Z043 Encounter for examination and observation following other accident: Secondary | ICD-10-CM | POA: Insufficient documentation

## 2024-01-22 DIAGNOSIS — W1839XA Other fall on same level, initial encounter: Secondary | ICD-10-CM | POA: Insufficient documentation

## 2024-01-22 DIAGNOSIS — W19XXXA Unspecified fall, initial encounter: Secondary | ICD-10-CM

## 2024-01-22 DIAGNOSIS — Z7901 Long term (current) use of anticoagulants: Secondary | ICD-10-CM | POA: Insufficient documentation

## 2024-01-22 DIAGNOSIS — Z8673 Personal history of transient ischemic attack (TIA), and cerebral infarction without residual deficits: Secondary | ICD-10-CM | POA: Insufficient documentation

## 2024-01-22 LAB — COMPREHENSIVE METABOLIC PANEL WITH GFR
ALT: 6 U/L (ref 0–44)
AST: 12 U/L — ABNORMAL LOW (ref 15–41)
Albumin: 3.8 g/dL (ref 3.5–5.0)
Alkaline Phosphatase: 137 U/L — ABNORMAL HIGH (ref 38–126)
Anion gap: 10 (ref 5–15)
BUN: 17 mg/dL (ref 6–20)
CO2: 27 mmol/L (ref 22–32)
Calcium: 9.3 mg/dL (ref 8.9–10.3)
Chloride: 103 mmol/L (ref 98–111)
Creatinine, Ser: 1.1 mg/dL (ref 0.61–1.24)
GFR, Estimated: >60 mL/min (ref >60)
Glucose, Bld: 325 mg/dL — ABNORMAL HIGH (ref 70–99)
Potassium: 3.7 mmol/L (ref 3.5–5.1)
Sodium: 140 mmol/L (ref 135–145)
Total Bilirubin: 0.3 mg/dL (ref 0.0–1.2)
Total Protein: 6.3 g/dL — ABNORMAL LOW (ref 6.5–8.1)

## 2024-01-22 LAB — URINALYSIS, ROUTINE W REFLEX MICROSCOPIC
Bacteria, UA: NONE SEEN
Bilirubin Urine: NEGATIVE
Glucose, UA: >=500 mg/dL — AB
Hgb urine dipstick: NEGATIVE
Ketones, ur: NEGATIVE mg/dL
Leukocytes,Ua: NEGATIVE
Nitrite: NEGATIVE
Protein, ur: 100 mg/dL — AB
Specific Gravity, Urine: 1.039 — ABNORMAL HIGH (ref 1.005–1.030)
pH: 5 (ref 5.0–8.0)

## 2024-01-22 LAB — CBC
HCT: 40.1 % (ref 39.0–52.0)
Hemoglobin: 12.9 g/dL — ABNORMAL LOW (ref 13.0–17.0)
MCH: 30.4 pg (ref 26.0–34.0)
MCHC: 32.2 g/dL (ref 30.0–36.0)
MCV: 94.6 fL (ref 80.0–100.0)
Platelets: 201 K/uL (ref 150–400)
RBC: 4.24 MIL/uL (ref 4.22–5.81)
RDW: 12.4 % (ref 11.5–15.5)
WBC: 4.9 K/uL (ref 4.0–10.5)
nRBC: 0 % (ref 0.0–0.2)

## 2024-01-22 MED ORDER — SODIUM CHLORIDE 0.9 % IV BOLUS
1000.0000 mL | Freq: Once | INTRAVENOUS | Status: AC
  Administered 2024-01-22: 1000 mL via INTRAVENOUS

## 2024-01-22 MED ORDER — AMLODIPINE BESYLATE 5 MG PO TABS
10.0000 mg | ORAL_TABLET | Freq: Once | ORAL | Status: AC
  Administered 2024-01-22: 10 mg via ORAL
  Filled 2024-01-22: qty 2

## 2024-01-22 MED ORDER — APIXABAN 5 MG PO TABS
5.0000 mg | ORAL_TABLET | Freq: Once | ORAL | Status: AC
  Administered 2024-01-22: 5 mg via ORAL
  Filled 2024-01-22: qty 1

## 2024-01-22 NOTE — ED Notes (Signed)
 Pt called for a ride at this time. Was unable to get a hold of person. Left voicemail.

## 2024-01-22 NOTE — ED Provider Notes (Signed)
°  Physical Exam  BP (!) 149/97 (BP Location: Left Arm)   Pulse 79   Temp 98.2 F (36.8 C) (Oral)   Resp 16   Wt 62.4 kg   SpO2 96%   BMI 21.55 kg/m   Physical Exam  Procedures  Procedures  ED Course / MDM    Medical Decision Making Amount and/or Complexity of Data Reviewed Labs: ordered. Radiology: ordered.   This patient's care was signed out to me pending urinalysis to evaluate for possible infection.  Urine sample was collected and does not show any evidence of infection.  His workup is otherwise unremarkable.  I discussed results with the patient who feels comfortable with discharge at this time.  He is to follow-up with his primary care physician within the next few days.  Return to the emergency department for any new or worsening symptoms.       Rusti Arizmendi M, MD 01/22/24 [phone]

## 2024-01-22 NOTE — ED Notes (Signed)
 Pt resting in bed, provided with urinal per request, denies any additional needs at this time, awaiting transport home

## 2024-01-22 NOTE — ED Notes (Signed)
 Called Pt's brother at this time. Per pt he does not have a way to get him home.

## 2024-01-22 NOTE — ED Provider Notes (Signed)
 [facility] Provider Note    Event Date/Time   First MD Initiated Contact with Patient 01/22/24 1141     (approximate)   History   Fall   HPI  Henry Munoz is a 58 y.o. male who presents to the emergency department today because of concerns for a fall.  He states that his left leg went out from under him.  He says this has been happening for a long time since he had a stroke.  He says that he did hit his head when he fell today.  Says that he is on Eliquis .  The patient denies any recent fevers or illness.     Physical Exam   Triage Vital Signs: ED Triage Vitals  Encounter Vitals Group     BP 01/22/24 1102 (!) 149/97     Girls Systolic BP Percentile --      Girls Diastolic BP Percentile --      Boys Systolic BP Percentile --      Boys Diastolic BP Percentile --      Pulse Rate 01/22/24 1102 79     Resp 01/22/24 1102 16     Temp 01/22/24 1102 98.2 F (36.8 C)     Temp Source 01/22/24 1102 Oral     SpO2 01/22/24 1102 96 %     Weight 01/22/24 1102 137 lb 9.1 oz (62.4 kg)     Height --      Head Circumference --      Peak Flow --      Pain Score 01/22/24 1101 8     Pain Loc --      Pain Education --      Exclude from Growth Chart --     Most recent vital signs: Vitals:   01/22/24 1102  BP: (!) 149/97  Pulse: 79  Resp: 16  Temp: 98.2 F (36.8 C)  SpO2: 96%   General: Awake, alert, oriented. CV:  Good peripheral perfusion. Regular rate and rhythm. Resp:  Normal effort. Lungs clear. Abd:  No distention.   ED Results / Procedures / Treatments   Labs (all labs ordered are listed, but only abnormal results are displayed) Labs Reviewed  COMPREHENSIVE METABOLIC PANEL WITH GFR - Abnormal; Notable for the following components:      Result Value   Glucose, Bld 325 (*)    Total Protein 6.3 (*)    AST 12 (*)    Alkaline Phosphatase 137 (*)    All other components within normal limits  CBC - Abnormal; Notable for the following  components:   Hemoglobin 12.9 (*)    All other components within normal limits  URINALYSIS, ROUTINE W REFLEX MICROSCOPIC  CBG MONITORING, ED     EKG  I, Guadalupe Eagles, attending physician, personally viewed and interpreted this EKG  EKG Time: 1107 Rate: 86 Rhythm: normal sinus rhythm Axis: left axis deviation Intervals: qtc 440 QRS: LAFB ST changes: no st elevation Impression: abnormal ekg    RADIOLOGY I independently interpreted and visualized the CT head. My interpretation: No ICH Radiology interpretation:  IMPRESSION:  1. Mild motion artifact.  2. No acute traumatic injury identified.  3. Stable Non-contrast CT appearance of advanced chronic ischemic disease.      PROCEDURES:  Critical Care performed: No   MEDICATIONS ORDERED IN ED: Medications - No data to display   IMPRESSION / MDM / ASSESSMENT AND PLAN / ED COURSE  I reviewed the triage vital signs and the nursing  notes.                              Differential diagnosis includes, but is not limited to, stroke, recrudescence, anemia, electrolyte abnormality  Patient's presentation is most consistent with acute presentation with potential threat to life or bodily function.   Patient presented to the emergency department today because of a fall.  The patient states that he had his left leg go out from him.  He says that this happened yesterday as well and that it has happened since he had a stroke a while ago.  On exam no obvious significant traumatic injuries.  Did he had a head CT which did not show any obvious acute stroke nor intracranial bleed.  Blood work without concerning anemia or electrolyte abnormalities.  Awaiting urine at time of signout given concern for possible worsening of stroke symptoms with infection. If negative do think it would be reasonable for discharge.   FINAL CLINICAL IMPRESSION(S) / ED DIAGNOSES   Final diagnoses:  Fall, initial encounter     Note:  This document was  prepared using Dragon voice recognition software and may include unintentional dictation errors.    Floy Roberts, MD 01/22/24 [phone]

## 2024-01-22 NOTE — ED Triage Notes (Signed)
 BIB EMS from home. Had an unwitnessed fall out of bed, c/o left leg pain. Hit head on a chair on the left cheek. No LOC.  Had a fall yesterday as well.  146/83 CBG: 311 77 98% RA  500 NS via 20g RFA.  Has been out of insulin  for a week or two.  States left leg gave out this morning when getting out of bed to go to the bathroom.  Yesterday when he fell states it was the same situation, left leg gave out when getting out of bed to go to the bathroom.

## 2024-01-22 NOTE — ED Notes (Signed)
 In to help pt up from bed to use urinal to obtain urine sample. Pt helped for the bed into a standing position. Pt able to stay standing but did start to lean forwards. This RN instructed pt to sit back down on the bed. Pt non-complaint with instructions and continued to weave back and forth. Staff assist button pressed. Maria NT to bedside. Pt leaned towards the left towards this RN where NT Hadassah joined to help gently lower pt to the floor. Rosina PEAK, Les Paramedic, [place] RN, Hadassah NT, and this RN helped pt back into the bed. Pt urinated on self. Linens changed along with pt clothing removed and placed into belongings bag.

## 2024-01-23 ENCOUNTER — Emergency Department

## 2024-01-23 ENCOUNTER — Other Ambulatory Visit: Payer: Self-pay

## 2024-01-23 ENCOUNTER — Inpatient Hospital Stay
Admission: EM | Admit: 2024-01-23 | Discharge: 2024-01-25 | DRG: 164 | Disposition: A | Attending: Family Medicine | Admitting: Family Medicine

## 2024-01-23 DIAGNOSIS — E119 Type 2 diabetes mellitus without complications: Secondary | ICD-10-CM | POA: Diagnosis not present

## 2024-01-23 DIAGNOSIS — Z8673 Personal history of transient ischemic attack (TIA), and cerebral infarction without residual deficits: Secondary | ICD-10-CM | POA: Diagnosis not present

## 2024-01-23 DIAGNOSIS — I110 Hypertensive heart disease with heart failure: Secondary | ICD-10-CM | POA: Diagnosis present

## 2024-01-23 DIAGNOSIS — Z8419 Family history of other disorders of kidney and ureter: Secondary | ICD-10-CM | POA: Diagnosis not present

## 2024-01-23 DIAGNOSIS — R636 Underweight: Secondary | ICD-10-CM | POA: Diagnosis present

## 2024-01-23 DIAGNOSIS — Y92009 Unspecified place in unspecified non-institutional (private) residence as the place of occurrence of the external cause: Secondary | ICD-10-CM | POA: Diagnosis not present

## 2024-01-23 DIAGNOSIS — E1165 Type 2 diabetes mellitus with hyperglycemia: Secondary | ICD-10-CM | POA: Diagnosis present

## 2024-01-23 DIAGNOSIS — F1410 Cocaine abuse, uncomplicated: Secondary | ICD-10-CM | POA: Diagnosis present

## 2024-01-23 DIAGNOSIS — Z91148 Patient's other noncompliance with medication regimen for other reason: Secondary | ICD-10-CM | POA: Diagnosis not present

## 2024-01-23 DIAGNOSIS — I10 Essential (primary) hypertension: Secondary | ICD-10-CM | POA: Diagnosis not present

## 2024-01-23 DIAGNOSIS — Z7901 Long term (current) use of anticoagulants: Secondary | ICD-10-CM | POA: Diagnosis not present

## 2024-01-23 DIAGNOSIS — J449 Chronic obstructive pulmonary disease, unspecified: Secondary | ICD-10-CM | POA: Diagnosis present

## 2024-01-23 DIAGNOSIS — R8281 Pyuria: Secondary | ICD-10-CM | POA: Diagnosis not present

## 2024-01-23 DIAGNOSIS — R600 Localized edema: Secondary | ICD-10-CM | POA: Diagnosis not present

## 2024-01-23 DIAGNOSIS — Z1152 Encounter for screening for COVID-19: Secondary | ICD-10-CM | POA: Diagnosis not present

## 2024-01-23 DIAGNOSIS — S0083XA Contusion of other part of head, initial encounter: Secondary | ICD-10-CM | POA: Diagnosis not present

## 2024-01-23 DIAGNOSIS — I5032 Chronic diastolic (congestive) heart failure: Secondary | ICD-10-CM | POA: Diagnosis present

## 2024-01-23 DIAGNOSIS — I50810 Right heart failure, unspecified: Secondary | ICD-10-CM | POA: Diagnosis not present

## 2024-01-23 DIAGNOSIS — I69354 Hemiplegia and hemiparesis following cerebral infarction affecting left non-dominant side: Secondary | ICD-10-CM | POA: Diagnosis not present

## 2024-01-23 DIAGNOSIS — F17210 Nicotine dependence, cigarettes, uncomplicated: Secondary | ICD-10-CM | POA: Diagnosis present

## 2024-01-23 DIAGNOSIS — Z79899 Other long term (current) drug therapy: Secondary | ICD-10-CM

## 2024-01-23 DIAGNOSIS — R7989 Other specified abnormal findings of blood chemistry: Secondary | ICD-10-CM | POA: Diagnosis not present

## 2024-01-23 DIAGNOSIS — M7989 Other specified soft tissue disorders: Secondary | ICD-10-CM | POA: Diagnosis not present

## 2024-01-23 DIAGNOSIS — F259 Schizoaffective disorder, unspecified: Secondary | ICD-10-CM | POA: Diagnosis present

## 2024-01-23 DIAGNOSIS — R0902 Hypoxemia: Secondary | ICD-10-CM | POA: Diagnosis present

## 2024-01-23 DIAGNOSIS — W19XXXA Unspecified fall, initial encounter: Secondary | ICD-10-CM | POA: Diagnosis not present

## 2024-01-23 DIAGNOSIS — R072 Precordial pain: Secondary | ICD-10-CM | POA: Diagnosis not present

## 2024-01-23 DIAGNOSIS — Z9889 Other specified postprocedural states: Secondary | ICD-10-CM | POA: Diagnosis not present

## 2024-01-23 DIAGNOSIS — Z888 Allergy status to other drugs, medicaments and biological substances status: Secondary | ICD-10-CM | POA: Diagnosis not present

## 2024-01-23 DIAGNOSIS — Z59819 Housing instability, housed unspecified: Secondary | ICD-10-CM | POA: Diagnosis not present

## 2024-01-23 DIAGNOSIS — I5030 Unspecified diastolic (congestive) heart failure: Secondary | ICD-10-CM | POA: Diagnosis not present

## 2024-01-23 DIAGNOSIS — Z681 Body mass index (BMI) 19 or less, adult: Secondary | ICD-10-CM | POA: Diagnosis not present

## 2024-01-23 DIAGNOSIS — I2699 Other pulmonary embolism without acute cor pulmonale: Secondary | ICD-10-CM | POA: Diagnosis present

## 2024-01-23 DIAGNOSIS — S0990XA Unspecified injury of head, initial encounter: Secondary | ICD-10-CM | POA: Diagnosis present

## 2024-01-23 DIAGNOSIS — R296 Repeated falls: Secondary | ICD-10-CM | POA: Diagnosis present

## 2024-01-23 DIAGNOSIS — Z794 Long term (current) use of insulin: Secondary | ICD-10-CM | POA: Diagnosis not present

## 2024-01-23 DIAGNOSIS — Z833 Family history of diabetes mellitus: Secondary | ICD-10-CM | POA: Diagnosis not present

## 2024-01-23 LAB — GLUCOSE, CAPILLARY
Glucose-Capillary: 299 mg/dL — ABNORMAL HIGH (ref 70–99)
Glucose-Capillary: 176 mg/dL — ABNORMAL HIGH (ref 70–99)
Glucose-Capillary: 165 mg/dL — ABNORMAL HIGH (ref 70–99)

## 2024-01-23 LAB — COMPREHENSIVE METABOLIC PANEL WITH GFR
ALT: <5 U/L (ref 0–44)
AST: 13 U/L — ABNORMAL LOW (ref 15–41)
Albumin: 3.2 g/dL — ABNORMAL LOW (ref 3.5–5.0)
Alkaline Phosphatase: 101 U/L (ref 38–126)
Anion gap: 9 (ref 5–15)
BUN: 10 mg/dL (ref 6–20)
CO2: 22 mmol/L (ref 22–32)
Calcium: 7.9 mg/dL — ABNORMAL LOW (ref 8.9–10.3)
Chloride: 111 mmol/L (ref 98–111)
Creatinine, Ser: 0.7 mg/dL (ref 0.61–1.24)
GFR, Estimated: >60 mL/min (ref >60)
Glucose, Bld: 210 mg/dL — ABNORMAL HIGH (ref 70–99)
Potassium: 3.1 mmol/L — ABNORMAL LOW (ref 3.5–5.1)
Sodium: 142 mmol/L (ref 135–145)
Total Bilirubin: 0.3 mg/dL (ref 0.0–1.2)
Total Protein: 5.2 g/dL — ABNORMAL LOW (ref 6.5–8.1)

## 2024-01-23 LAB — CBC
HCT: 44.6 % (ref 39.0–52.0)
Hemoglobin: 14.3 g/dL (ref 13.0–17.0)
MCH: 30.1 pg (ref 26.0–34.0)
MCHC: 32.1 g/dL (ref 30.0–36.0)
MCV: 93.9 fL (ref 80.0–100.0)
Platelets: 230 K/uL (ref 150–400)
RBC: 4.75 MIL/uL (ref 4.22–5.81)
RDW: 12.1 % (ref 11.5–15.5)
WBC: 6.5 K/uL (ref 4.0–10.5)
nRBC: 0 % (ref 0.0–0.2)

## 2024-01-23 LAB — CBG MONITORING, ED: Glucose-Capillary: 190 mg/dL — ABNORMAL HIGH (ref 70–99)

## 2024-01-23 LAB — URINE DRUG SCREEN
Amphetamines: NEGATIVE
Barbiturates: NEGATIVE
Benzodiazepines: NEGATIVE
Cocaine: NEGATIVE
Fentanyl: NEGATIVE
Methadone Scn, Ur: NEGATIVE
Opiates: NEGATIVE
Tetrahydrocannabinol: NEGATIVE

## 2024-01-23 LAB — TROPONIN T, HIGH SENSITIVITY
Troponin T High Sensitivity: 155 ng/L (ref 0–19)
Troponin T High Sensitivity: 150 ng/L (ref 0–19)

## 2024-01-23 LAB — HEPARIN LEVEL (UNFRACTIONATED): Heparin Unfractionated: 0.8 [IU]/mL — ABNORMAL HIGH (ref 0.30–0.70)

## 2024-01-23 LAB — RESP PANEL BY RT-PCR (RSV, FLU A&B, COVID)  RVPGX2
Influenza A by PCR: NEGATIVE
Influenza B by PCR: NEGATIVE
Resp Syncytial Virus by PCR: NEGATIVE
SARS Coronavirus 2 by RT PCR: NEGATIVE

## 2024-01-23 LAB — PROTIME-INR
INR: 1.2 (ref 0.8–1.2)
Prothrombin Time: 15.7 s — ABNORMAL HIGH (ref 11.4–15.2)

## 2024-01-23 LAB — APTT
aPTT: 31 s (ref 24–36)
aPTT: 51 s — ABNORMAL HIGH (ref 24–36)

## 2024-01-23 LAB — MRSA NEXT GEN BY PCR, NASAL: MRSA by PCR Next Gen: NOT DETECTED

## 2024-01-23 LAB — HEMOGLOBIN A1C
Hgb A1c MFr Bld: 12.7 % — ABNORMAL HIGH (ref 4.8–5.6)
Mean Plasma Glucose: 317.79 mg/dL

## 2024-01-23 LAB — HIV ANTIBODY (ROUTINE TESTING W REFLEX): HIV Screen 4th Generation wRfx: NONREACTIVE

## 2024-01-23 MED ORDER — HEPARIN (PORCINE) 25000 UT/250ML-% IV SOLN
1050.0000 [IU]/h | INTRAVENOUS | Status: DC
  Administered 2024-01-23: 11:00:00 900 [IU]/h via INTRAVENOUS
  Administered 2024-01-24: 08:00:00 1050 [IU]/h via INTRAVENOUS
  Filled 2024-01-23 (×2): qty 250

## 2024-01-23 MED ORDER — HEPARIN BOLUS VIA INFUSION
4000.0000 [IU] | Freq: Once | INTRAVENOUS | Status: AC
  Administered 2024-01-23: 11:00:00 4000 [IU] via INTRAVENOUS
  Filled 2024-01-23: qty 4000

## 2024-01-23 MED ORDER — LABETALOL HCL 5 MG/ML IV SOLN
10.0000 mg | INTRAVENOUS | Status: DC | PRN
  Administered 2024-01-23: 12:00:00 10 mg via INTRAVENOUS
  Filled 2024-01-23 (×2): qty 4

## 2024-01-23 MED ORDER — ONDANSETRON HCL 4 MG/2ML IJ SOLN: 4.0000 mg | Freq: Four times a day (QID) | INTRAMUSCULAR | Status: DC | PRN

## 2024-01-23 MED ORDER — INSULIN ASPART 100 UNIT/ML IJ SOLN
0.0000 [IU] | Freq: Three times a day (TID) | INTRAMUSCULAR | Status: DC
  Administered 2024-01-23: 17:00:00 8 [IU] via SUBCUTANEOUS
  Administered 2024-01-23: 12:00:00 3 [IU] via SUBCUTANEOUS
  Administered 2024-01-24 (×2): 2 [IU] via SUBCUTANEOUS
  Administered 2024-01-24 – 2024-01-25 (×2): 5 [IU] via SUBCUTANEOUS
  Administered 2024-01-25: 12:00:00 8 [IU] via SUBCUTANEOUS
  Filled 2024-01-23: qty 2
  Filled 2024-01-23: qty 5
  Filled 2024-01-23 (×2): qty 8
  Filled 2024-01-23: qty 5
  Filled 2024-01-23: qty 3
  Filled 2024-01-23: qty 2

## 2024-01-23 MED ORDER — ARIPIPRAZOLE 5 MG PO TABS
5.0000 mg | ORAL_TABLET | Freq: Every day | ORAL | Status: DC
  Administered 2024-01-23 – 2024-01-25 (×3): 5 mg via ORAL
  Filled 2024-01-23 (×4): qty 1

## 2024-01-23 MED ORDER — SODIUM CHLORIDE 0.9 % IV SOLN: INTRAVENOUS | Status: DC

## 2024-01-23 MED ORDER — ACETAMINOPHEN 325 MG PO TABS: 650.0000 mg | ORAL_TABLET | Freq: Four times a day (QID) | ORAL | Status: DC | PRN

## 2024-01-23 MED ORDER — HEPARIN BOLUS VIA INFUSION
1500.0000 [IU] | Freq: Once | INTRAVENOUS | Status: AC
  Administered 2024-01-23: 19:00:00 1500 [IU] via INTRAVENOUS
  Filled 2024-01-23: qty 1500

## 2024-01-23 MED ORDER — HYDROMORPHONE HCL 1 MG/ML IJ SOLN: 0.5000 mg | INTRAMUSCULAR | Status: DC | PRN

## 2024-01-23 MED ORDER — ALBUTEROL SULFATE (2.5 MG/3ML) 0.083% IN NEBU: 3.0000 mL | INHALATION_SOLUTION | RESPIRATORY_TRACT | Status: DC | PRN

## 2024-01-23 MED ORDER — SODIUM CHLORIDE 0.9 % IV BOLUS
500.0000 mL | Freq: Once | INTRAVENOUS | Status: AC
  Administered 2024-01-23: 08:00:00 500 mL via INTRAVENOUS

## 2024-01-23 MED ORDER — IOHEXOL 350 MG/ML SOLN
75.0000 mL | Freq: Once | INTRAVENOUS | Status: AC | PRN
  Administered 2024-01-23: 10:00:00 75 mL via INTRAVENOUS

## 2024-01-23 MED ORDER — INSULIN ASPART 100 UNIT/ML IJ SOLN
0.0000 [IU] | Freq: Every day | INTRAMUSCULAR | Status: DC
  Administered 2024-01-24: 22:00:00 3 [IU] via SUBCUTANEOUS
  Filled 2024-01-23: qty 3

## 2024-01-23 MED ORDER — ONDANSETRON HCL 4 MG PO TABS: 4.0000 mg | ORAL_TABLET | Freq: Four times a day (QID) | ORAL | Status: DC | PRN

## 2024-01-23 MED ORDER — GABAPENTIN 300 MG PO CAPS
300.0000 mg | ORAL_CAPSULE | Freq: Three times a day (TID) | ORAL | Status: DC
  Administered 2024-01-23 – 2024-01-25 (×6): 300 mg via ORAL
  Filled 2024-01-23 (×6): qty 1

## 2024-01-23 MED ORDER — ACETAMINOPHEN 650 MG RE SUPP: 650.0000 mg | Freq: Four times a day (QID) | RECTAL | Status: DC | PRN

## 2024-01-23 NOTE — ED Notes (Signed)
 Called lab to add-on heparin  level

## 2024-01-23 NOTE — Consult Note (Addendum)
 PHARMACY - ANTICOAGULATION CONSULT NOTE  Pharmacy Consult for IV Heparin  Indication: pulmonary embolus  Allergies[1]  Patient Measurements: Height: 5' 7 (170.2 cm) Weight: 52.2 kg (115 lb 2 oz) IBW/kg (Calculated) : 66.1 HEPARIN  DW (KG): 52.2  Vital Signs: Temp: 98 F (36.7 C) (12/15 0650) Temp Source: Oral (12/15 0650) BP: 164/108 (12/15 1000) Pulse Rate: 101 (12/15 1000)  Labs: Recent Labs    01/22/24 1105 01/23/24 0733 01/23/24 0846  HGB 12.9* 14.3  --   HCT 40.1 44.6  --   PLT 201 230  --   CREATININE 1.10  --  0.70    Estimated Creatinine Clearance: 74.3 mL/min (by C-G formula based on SCr of 0.7 mg/dL).   Medical History: Past Medical History:  Diagnosis Date   Alcohol abuse    Cocaine abuse (HCC)    Diabetes mellitus without complication (HCC)    Diastolic dysfunction    a. 06/2022 Echo (in setting of acute PE):  EF 50-55%, no rwma, mod-sev LVH, grI DD, mod reduced RV fxn, triv MR w/ MVP of post leaflet, ? bicuspid AoV w/ triv AI and Ao sclerosis.   Hypertension    Pulmonary embolism (HCC)    a. 06/2022 CTA: Large volume PE affecting all lobar braches w/ R heart strain-->s/p mech thrombectomy.   Schizo-affective schizophrenia (HCC)    Stroke (HCC)    TIA (transient ischemic attack)    Tobacco abuse     Medications:  Medication reconciliation is pending. Appears patient was on apixaban  in the past but appears Rx expired 08/19/2022.  Assessment: 58 y/o M with medical history as above and including PE in 06/2022 no longer on blood thinners diagnosed with PE in the emergency department on 01/23/24. Pharmacy consulted to dose heparin .   Baseline aPTT, INR and heparin  level are pending. Baseline CBC within normal limits  Goal of Therapy:  Heparin  level 0.3-0.7 units/ml Monitor platelets by anticoagulation protocol: Yes   Plan:  --Heparin  4000 unit IV bolus followed by continuous infusion at 900 units/hr --Check a heparin  level 6 hours from start.  Checking a baseline heparin  level just in case but not expecting patient is on apixaban  still base on chart review >> patient did receive a dose of Eliquis  in ED on 01/22/24 at 2149 --Daily CBC per protocol while on IV heparin   Henry Munoz B Henry Munoz 01/23/2024,10:43 AM     [1]  Allergies Allergen Reactions   Haldol  [Haloperidol  Lactate]

## 2024-01-23 NOTE — H&P (Signed)
 History and Physical    Henry Munoz FMW:[id number] DOB: February 14, 1965 DOA: 01/23/2024  PCP: [facility] (Confirm with patient/family/NH records and if not entered, this has to be entered at [facility] point of entry) Patient coming from: Home  I have personally briefly reviewed patient's old medical records in [facility]  Chief Complaint: Right shoulder pain  HPI: Henry Munoz is a 58 y.o. male with medical history significant of PE nonadherent with Eliquis , HTN, chronic HFpEF, cocaine abuse, COPD, IIDM, schizoaffective disorder, CVA presented to ED with worsening right shoulder pain and frequent falls.  Patient admitted that he has not been taking Eliquis  consistently, last dose was 4 days ago.  Will subsequently start to have worsening of right shoulder pain, constant, denies any exacerbating or relieving factors.  He occasionally felt lightheadedness and had couple falls yesterday.  But denied any head or neck injury no LOC.  ED Course: Tachycardia heart rate in the low 100s, blood pressure elevated 160/102 saturation 95% on room air.  CTA showed multifocal PE pulmonary artery bifurcation, with signs of right heart strain RV: LV ratio more than 1.  Troponin first set 155.  Patient was started on heparin  drip, vascular surgery consulted.  Review of Systems: As per HPI otherwise 14 point review of systems negative.   Past Medical History:  Diagnosis Date   Alcohol abuse    Cocaine abuse (HCC)    Diabetes mellitus without complication (HCC)    Diastolic dysfunction    a. 06/2022 Echo (in setting of acute PE):  EF 50-55%, no rwma, mod-sev LVH, grI DD, mod reduced RV fxn, triv MR w/ MVP of post leaflet, ? bicuspid AoV w/ triv AI and Ao sclerosis.   Hypertension    Pulmonary embolism (HCC)    a. 06/2022 CTA: Large volume PE affecting all lobar braches w/ R heart strain-->s/p mech thrombectomy.   Schizo-affective schizophrenia (HCC)    Stroke [facility])    TIA  (transient ischemic attack)    Tobacco abuse     Past Surgical History:  Procedure Laterality Date   PULMONARY THROMBECTOMY Bilateral 06/30/2022   Procedure: PULMONARY THROMBECTOMY;  Surgeon: Jama Cordella MATSU, MD;  Location: [facility] INVASIVE CV LAB;  Service: Cardiovascular;  Laterality: Bilateral;     reports that he has been smoking cigarettes. He has a 7.5 pack-year smoking history. He has never used smokeless tobacco. He reports current alcohol use of about 2.0 standard drinks of alcohol per week. He reports current drug use. Drug: Cocaine.  Allergies[1]  Family History  Problem Relation Age of Onset   High blood pressure Father    Diabetes Father    Renal Disease Father      Prior to Admission medications  Medication Sig Start Date End Date Taking? Authorizing Provider  albuterol  (VENTOLIN  HFA) 108 (90 Base) MCG/ACT inhaler Inhale 2-4 puffs into the lungs every 4 (four) hours as needed for wheezing or shortness of breath. 01/27/23  Yes Mumma, Clotilda, MD  amLODipine  (NORVASC ) 10 MG tablet Take 1 tablet (10 mg total) by mouth daily. 07/21/22 01/23/24 Yes Massengill, Rankin, MD  ketoconazole (NIZORAL) 2 % cream Apply topically 2 (two) times daily. 08/29/23  Yes [provider]  apixaban  (ELIQUIS ) 5 MG TABS tablet Take 1 tablet (5 mg total) by mouth every 12 (twelve) hours. 07/20/22 08/19/22  Massengill, Rankin, MD  ARIPiprazole  (ABILIFY ) 15 MG tablet Take 1 tablet (15 mg total) by mouth daily. 07/21/22 08/20/22  Johny Rankin, MD  atorvastatin  (LIPITOR)  40 MG tablet Take 1 tablet (40 mg total) by mouth daily. Patient not taking: Reported on 02/22/2023 07/20/22   Johny Lot, MD  Blood Glucose Monitoring Suppl DEVI 1 each by Does not apply route 3 (three) times daily. May dispense any manufacturer covered by patient's insurance. 07/28/22   Poggi, Jenna E, PA-C  Fluticasone-Umeclidin-Vilant (TRELEGY ELLIPTA ) 100-62.5-25 MCG/ACT AEPB Inhale 1 application  into the lungs  daily. Patient not taking: Reported on 01/23/2024 02/26/23   Awanda City, MD  gabapentin  (NEURONTIN ) 600 MG tablet Take 600 mg by mouth 3 (three) times daily. 11/18/22   [provider]  Glucose Blood (BLOOD GLUCOSE TEST STRIPS) STRP 1 each by Does not apply route 3 (three) times daily. Use as directed to check blood sugar. May dispense any manufacturer covered by patient's insurance and fits patient's device. 02/26/23   Awanda City, MD  insulin  aspart (NOVOLOG ) 100 UNIT/ML FlexPen If eating and Blood Glucose (BG) 80 or higher inject 4 units for meal coverage and add correction dose per scale. If not eating, correction dose only. BG <150= 0 unit; BG 150-200= 1 unit; BG 201-250= 2 unit; BG 251-300= 3 unit; BG 301-350= 4 unit; BG 351-400= 5 unit; BG >400= 6 unit and Call Primary care. Patient not taking: Reported on 01/23/2024 02/26/23   Awanda City, MD  insulin  glargine (LANTUS ) 100 UNIT/ML Solostar Pen Inject 25 Units into the skin daily. Increased from 20 unit daily. Patient not taking: Reported on 01/23/2024 02/26/23   Awanda City, MD  Insulin  Pen Needle (PEN NEEDLES) 31G X 5 MM MISC 1 each by Does not apply route 3 (three) times daily. May dispense any manufacturer covered by patient's insurance. 02/26/23   Awanda City, MD  ipratropium-albuterol  (DUONEB) 0.5-2.5 (3) MG/3ML SOLN Take 3 mLs by nebulization every 6 (six) hours as needed. Patient not taking: Reported on 01/23/2024 02/26/23   Awanda City, MD  Lancet Device MISC 1 each by Does not apply route 3 (three) times daily. May dispense any manufacturer covered by patient's insurance. 02/26/23   Awanda City, MD  Lancets MISC 1 each by Does not apply route 3 (three) times daily. Use as directed to check blood sugar. May dispense any manufacturer covered by patient's insurance and fits patient's device. 02/26/23   Awanda City, MD  nicotine  (NICODERM CQ  - DOSED IN MG/24 HOURS) 14 mg/24hr patch Place 1 patch (14 mg total) onto the skin daily. Patient not taking:  Reported on 01/23/2024 07/21/22   Massengill, Lot, MD  OZEMPIC, 2 MG/DOSE, 8 MG/3ML SOPN Inject 1 Dose into the skin every 7 (seven) days. Patient not taking: Reported on 01/23/2024 01/31/23   [provider]    Physical Exam: Vitals:   01/23/24 0930 01/23/24 1000 01/23/24 1048 01/23/24 1100  BP: (!) 159/98 (!) 164/108  (!) 164/113  Pulse: (!) 102 (!) 101  96  Resp:  20  20  Temp:   97.9 F (36.6 C)   TempSrc:   Oral   SpO2: 94% 93%  93%  Weight:      Height:        Constitutional: NAD, calm, comfortable Vitals:   01/23/24 0930 01/23/24 1000 01/23/24 1048 01/23/24 1100  BP: (!) 159/98 (!) 164/108  (!) 164/113  Pulse: (!) 102 (!) 101  96  Resp:  20  20  Temp:   97.9 F (36.6 C)   TempSrc:   Oral   SpO2: 94% 93%  93%  Weight:      Height:  Eyes: PERRL, lids and conjunctivae normal ENMT: Mucous membranes are moist. Posterior pharynx clear of any exudate or lesions.Normal dentition.  Neck: normal, supple, no masses, no thyromegaly Respiratory: clear to auscultation bilaterally, no wheezing, no crackles. Normal respiratory effort. No accessory muscle use.  Cardiovascular: Regular rate and rhythm, no murmurs / rubs / gallops. No extremity edema. 2+ pedal pulses. No carotid bruits.  Abdomen: no tenderness, no masses palpated. No hepatosplenomegaly. Bowel sounds positive.  Musculoskeletal: no clubbing / cyanosis. No joint deformity upper and lower extremities. Good ROM, no contractures. Normal muscle tone.  Skin: no rashes, lesions, ulcers. No induration Neurologic: CN 2-12 grossly intact. Sensation intact, DTR normal. Strength 5/5 in all 4.  Psychiatric: Normal judgment and insight. Alert and oriented x 3. Normal mood.     Labs on Admission: I have personally reviewed following labs and imaging studies  CBC: Recent Labs  Lab 01/22/24 1105 01/23/24 0733  WBC 4.9 6.5  HGB 12.9* 14.3  HCT 40.1 44.6  MCV 94.6 93.9  PLT 201 230   Basic Metabolic  Panel: Recent Labs  Lab 01/22/24 1105 01/23/24 0846  NA 140 142  K 3.7 3.1*  CL 103 111  CO2 27 22  GLUCOSE 325* 210*  BUN 17 10  CREATININE 1.10 0.70  CALCIUM  9.3 7.9*   GFR: Estimated Creatinine Clearance: 74.3 mL/min (by C-G formula based on SCr of 0.7 mg/dL). Liver Function Tests: Recent Labs  Lab 01/22/24 1105 01/23/24 0846  AST 12* 13*  ALT 6 <5  ALKPHOS 137* 101  BILITOT 0.3 0.3  PROT 6.3* 5.2*  ALBUMIN 3.8 3.2*   No results for input(s): LIPASE, AMYLASE in the last 168 hours. No results for input(s): AMMONIA  in the last 168 hours. Coagulation Profile: Recent Labs  Lab 01/23/24 0846  INR 1.2   Cardiac Enzymes: No results for input(s): CKTOTAL, CKMB, CKMBINDEX, TROPONINI in the last 168 hours. BNP (last 3 results) No results for input(s): PROBNP in the last 8760 hours. HbA1C: No results for input(s): HGBA1C in the last 72 hours. CBG: Recent Labs  Lab 01/23/24 1148  GLUCAP 190*   Lipid Profile: No results for input(s): CHOL, HDL, LDLCALC, TRIG, CHOLHDL, LDLDIRECT in the last 72 hours. Thyroid  Function Tests: No results for input(s): TSH, T4TOTAL, FREET4, T3FREE, THYROIDAB in the last 72 hours. Anemia Panel: No results for input(s): VITAMINB12, FOLATE, FERRITIN, TIBC, IRON, RETICCTPCT in the last 72 hours. Urine analysis:    Component Value Date/Time   COLORURINE YELLOW (A) 01/22/2024 1649   APPEARANCEUR HAZY (A) 01/22/2024 1649   LABSPEC 1.039 (H) 01/22/2024 1649   PHURINE 5.0 01/22/2024 1649   GLUCOSEU >=500 (A) 01/22/2024 1649   HGBUR NEGATIVE 01/22/2024 1649   BILIRUBINUR NEGATIVE 01/22/2024 1649   KETONESUR NEGATIVE 01/22/2024 1649   PROTEINUR 100 (A) 01/22/2024 1649   NITRITE NEGATIVE 01/22/2024 1649   LEUKOCYTESUR NEGATIVE 01/22/2024 1649    Radiological Exams on Admission: CT Angio Chest PE W and/or Wo Contrast Result Date: 01/23/2024 CLINICAL DATA:  Status post fall with right  shoulder pain. Known history of pulmonary embolism, recently discontinued anticoagulation. EXAM: CT ANGIOGRAPHY CHEST WITH CONTRAST TECHNIQUE: Multidetector CT imaging of the chest was performed using the standard protocol during bolus administration of intravenous contrast. Multiplanar CT image reconstructions and MIPs were obtained to evaluate the vascular anatomy. RADIATION DOSE REDUCTION: This exam was performed according to the departmental dose-optimization program which includes automated exposure control, adjustment of the mA and/or kV according to patient size and/or use of  iterative reconstruction technique. CONTRAST:  75mL OMNIPAQUE  IOHEXOL  350 MG/ML SOLN COMPARISON:  CTA chest dated 06/29/2022 FINDINGS: Cardiovascular: The study is adequate for the evaluation of pulmonary embolism to the level of mid lobar arteries due to motion artifact and beam hardening. There are multifocal filling defects at the main pulmonary artery bifurcation extending into bilateral main pulmonary arteries. Mildly dilated main pulmonary artery measures 3.2 cm. RV: LV ratio greater than 1. no significant pericardial fluid/thickening. Coronary artery calcifications. Mediastinum/Nodes: Imaged thyroid  gland without nodules meeting criteria for imaging follow-up by size. Normal esophagus. No pathologically enlarged axillary, supraclavicular, mediastinal, or hilar lymph nodes. Lungs/Pleura: The central airways are patent. No focal consolidation. No pneumothorax. No pleural effusion. Upper abdomen: Normal. Musculoskeletal: No acute or abnormal lytic or blastic osseous lesions. Multilevel degenerative changes of the thoracic spine. Review of the MIP images confirms the above findings. IMPRESSION: 1. Multifocal pulmonary emboli at the main pulmonary artery bifurcation extending into bilateral main pulmonary arteries. 2. RV: LV ratio greater than 1, suggestive of right heart strain. 3. Mildly dilated main pulmonary artery measures 3.2 cm,  which can be seen in the setting of pulmonary arterial hypertension. 4. Coronary artery calcifications. Assessment for potential risk factor modification, dietary therapy or pharmacologic therapy may be warranted, if clinically indicated. Critical Value/emergent results were called by telephone at the time of interpretation on 01/23/2024 at 10:28 am to provider PHILLIP STAFFORD , who verbally acknowledged these results. Electronically Signed   By: Limin  Xu M.D.   On: 01/23/2024 10:29   US  Venous Img Lower Unilateral Left Result Date: 01/23/2024 EXAM: ULTRASOUND DUPLEX OF THE LEFT LOWER EXTREMITY VEINS TECHNIQUE: Duplex ultrasound using B-mode/gray scaled imaging and Doppler spectral analysis and color flow was obtained of the deep venous structures of the left lower extremity. COMPARISON: Bilateral lower extremity Doppler ultrasound 05/25/last year. CLINICAL HISTORY: 58 year old male with left lower extremity edema and pain. FINDINGS: The common femoral vein, femoral vein, popliteal vein, and visible calf veins demonstrate normal compressibility with normal color flow and spectral analysis. Subcutaneous edema visible at the calf on image 23. Contralateral visible right common femoral vein is patent. IMPRESSION: 1. No evidence of left lower extremity DVT. 2. Subcutaneous edema in the left calf. Electronically signed by: Helayne Hurst MD 01/23/2024 08:31 AM EST RP Workstation: [id number]   DG Tibia/Fibula Left Result Date: 01/23/2024 EXAM: 3 VIEW(S) XRAY OF THE LEFT TIBIA AND FIBULA 01/23/2024 07:40:20 AM COMPARISON: None available. CLINICAL HISTORY: LLE edema, pain FINDINGS: BONES AND JOINTS: No acute fracture. No malalignment. SOFT TISSUES: Diffuse soft tissue swelling. Vascular calcifications. IMPRESSION: 1. Diffuse soft tissue swelling of the left lower extremity. 2. No acute osseous findings. 3. Vascular calcifications within the left lower extremity arteries. Electronically signed by: Waddell Calk MD  01/23/2024 07:46 AM EST RP Workstation: [id number]   CT Head Wo Contrast Result Date: 01/22/2024 EXAM: CT HEAD WITHOUT 01/22/2024 12:19:17 PM TECHNIQUE: CT of the head was performed without the administration of intravenous contrast. Automated exposure control, iterative reconstruction, and/or weight based adjustment of the mA/kV was utilized to reduce the radiation dose to as low as reasonably achievable. COMPARISON: Brain MRI 06/29/2022 and Head CT 01/06/2023. CLINICAL HISTORY: 58 year old male status post fall. FINDINGS: BRAIN AND VENTRICLES: No acute intracranial hemorrhage. No mass effect or midline shift. No extra-axial fluid collection. No evidence of acute infarct. No hydrocephalus. Stable brain volume. Chronic encephalomalacia in the right hemisphere corresponding to the MCA territory, and additional confluent bilateral chronic cerebral white matter hypodensity. Chronic lacunar infarcts  in the bilateral deep gray nuclei. Gray white differentiation appears stable. Chronically severe calcified atherosclerosis at the skull base. Intermittent mild motion artifact. ORBITS: No acute orbital injury. SINUSES AND MASTOIDS: Visible paranasal sinuses, tympanic cavities and mastoids remain well aerated. SOFT TISSUES AND SKULL: No acute skull fracture. Chronic vertex scalp soft tissue scarring. No acute scalp soft tissue injury. No acute soft tissue abnormality. IMPRESSION: 1. Mild motion artifact. 2. No acute traumatic injury identified. 3. Stable Non-contrast CT appearance of advanced chronic ischemic disease. Electronically signed by: Helayne Hurst MD 01/22/2024 12:39 PM EST RP Workstation: [id number]    EKG: Independently reviewed.  Sinus tachycardia, no acute ST changes.  Assessment/Plan Principal Problem:   PE (pulmonary thromboembolism) (HCC)  (please populate well all problems here in Problem List. (For example, if patient is on BP meds at home and you resume or decide to hold them, it is a problem  that needs to be her. Same for CAD, COPD, HLD and so on)  Massive PE, recurrent PE - Secondary to nonadherent to Eliquis  - Admitted to stepdown unit for close monitoring - Signs of right heart strain, vascular surgery consulted emergently, who plans to take patient for embolectomy tomorrow.  N.p.o. after midnight. - Hold off home BP meds - Continue heparin  drip - Echocardiogram DVT study negative.-  COPD - Stable, no symptoms or signs of acute exacerbation - Continue as needed DuoNebs  HTN Chronic HFpEF - Euvolemic - Hold off home BP meds due to massive PE - Start as needed labetalol   IDDM, with hyperglycemia Noncompliant with insulin  regimen - SSI  Polysubstance abuse - Currently still using cocaine. - Cessation education performed at bedside  DVT prophylaxis: Heparin  drip Code Status: Full code Family Communication: None at bedside Disposition Plan: Patient sick with massive PE requiring inpatient vascular surgery intervention, parenteral anticoagulation, expect more than 2 midnight hospital stay. Consults called: Vascular surgeon, Admission status: Stepdown unit admit.   Cort ONEIDA Mana MD [facility] Pager [phone]  01/23/2024, 12:40 PM       [1]  Allergies Allergen Reactions   Haldol  [Haloperidol  Lactate]

## 2024-01-23 NOTE — ED Notes (Addendum)
 Pt cleaned of urine, linens changes, warm blankets provided. Pt changed into hospital gown. IV reinforced with coban wrap.

## 2024-01-23 NOTE — H&P (View-Only) (Signed)
 Hospital Consult    Reason for Consult:  Pulmonary Embolism with Rt Heart Strain.  Requesting Physician:  Dr Manus Rouse MD.  MRN #:  [id number]  History of Present Illness: This is a 58 y.o. 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 Denies headache or vision changes, no vomiting or fever. Was seen here yesterday with reassuring evaluation at that time. He does now also endorse chest pain, no shortness of breath or cough.  Patient endorses that he felt good 2 days ago and then yesterday started to feel bad.  Had multiple issues at home.  He endorsed he is not taking Eliquis  anymore it ran out.  While I question as to why he let it run out he said because it did.  Upon workup patient underwent CTA of the chest with PE protocol.  He was noted to have multifocal pulmonary emboli at the main pulmonary artery bifurcation extending into the bilateral main pulmonary arteries.  He is noted to have an RV to LV ratio greater than 1 suggestive of right heart strain.  He is also noted to have mildly dilated main pulmonary arteries which are seen in the setting of pulmonary arterial hypertension.  Patient also underwent left lower extremity venous Doppler ultrasound to check for DVTs.  He was negative for DVT today.  Vascular surgery was consulted to evaluate.  Past Medical History:  Diagnosis Date   Alcohol abuse    Cocaine abuse (HCC)    Diabetes mellitus without complication (HCC)    Diastolic dysfunction    a. 06/2022 Echo (in setting of acute PE):  EF 50-55%, no rwma, mod-sev LVH, grI DD, mod reduced RV fxn, triv MR w/ MVP of post leaflet, ? bicuspid AoV w/ triv AI and Ao sclerosis.   Hypertension    Pulmonary embolism (HCC)    a. 06/2022 CTA: Large volume PE affecting all lobar braches w/ R heart strain-->s/p mech thrombectomy.   Schizo-affective schizophrenia (HCC)     Stroke [facility])    TIA (transient ischemic attack)    Tobacco abuse     Past Surgical History:  Procedure Laterality Date   PULMONARY THROMBECTOMY Bilateral 06/30/2022   Procedure: PULMONARY THROMBECTOMY;  Surgeon: Jama Cordella MATSU, MD;  Location: [facility] INVASIVE CV LAB;  Service: Cardiovascular;  Laterality: Bilateral;    Allergies[1]  Prior to Admission medications  Medication Sig Start Date End Date Taking? Authorizing Provider  albuterol  (VENTOLIN  HFA) 108 (90 Base) MCG/ACT inhaler Inhale 2-4 puffs into the lungs every 4 (four) hours as needed for wheezing or shortness of breath. 01/27/23   Suzanne Kirsch, MD  amLODipine  (NORVASC ) 10 MG tablet Take 1 tablet (10 mg total) by mouth daily. 07/21/22 08/20/22  Massengill, Rankin, MD  apixaban  (ELIQUIS ) 5 MG TABS tablet Take 1 tablet (5 mg total) by mouth every 12 (twelve) hours. 07/20/22 08/19/22  Massengill, Rankin, MD  ARIPiprazole  (ABILIFY ) 15 MG tablet Take 1 tablet (15 mg total) by mouth daily. 07/21/22 08/20/22  Massengill, Rankin, MD  atorvastatin  (LIPITOR) 40 MG tablet Take 1 tablet (40 mg total) by mouth daily. Patient not taking: Reported on 02/22/2023 07/20/22   Johny Rankin, MD  Blood Glucose Monitoring Suppl DEVI 1 each by Does not apply route 3 (three) times daily. May dispense any manufacturer covered by patient's insurance. 07/28/22   Poggi, Jenna E, PA-C  Fluticasone-Umeclidin-Vilant (TRELEGY ELLIPTA ) 100-62.5-25 MCG/ACT AEPB  Inhale 1 application  into the lungs daily. 02/26/23   Awanda City, MD  gabapentin  (NEURONTIN ) 600 MG tablet Take 600 mg by mouth 3 (three) times daily. 11/18/22   [provider]  Glucose Blood (BLOOD GLUCOSE TEST STRIPS) STRP 1 each by Does not apply route 3 (three) times daily. Use as directed to check blood sugar. May dispense any manufacturer covered by patient's insurance and fits patient's device. 02/26/23   Awanda City, MD  insulin  aspart (NOVOLOG ) 100 UNIT/ML FlexPen If eating and Blood Glucose  (BG) 80 or higher inject 4 units for meal coverage and add correction dose per scale. If not eating, correction dose only. BG <150= 0 unit; BG 150-200= 1 unit; BG 201-250= 2 unit; BG 251-300= 3 unit; BG 301-350= 4 unit; BG 351-400= 5 unit; BG >400= 6 unit and Call Primary care. 02/26/23   Awanda City, MD  insulin  glargine (LANTUS ) 100 UNIT/ML Solostar Pen Inject 25 Units into the skin daily. Increased from 20 unit daily. 02/26/23   Awanda City, MD  Insulin  Pen Needle (PEN NEEDLES) 31G X 5 MM MISC 1 each by Does not apply route 3 (three) times daily. May dispense any manufacturer covered by patient's insurance. 02/26/23   Awanda City, MD  ipratropium-albuterol  (DUONEB) 0.5-2.5 (3) MG/3ML SOLN Take 3 mLs by nebulization every 6 (six) hours as needed. 02/26/23   Awanda City, MD  Lancet Device MISC 1 each by Does not apply route 3 (three) times daily. May dispense any manufacturer covered by patient's insurance. 02/26/23   Awanda City, MD  Lancets MISC 1 each by Does not apply route 3 (three) times daily. Use as directed to check blood sugar. May dispense any manufacturer covered by patient's insurance and fits patient's device. 02/26/23   Awanda City, MD  nicotine  (NICODERM CQ  - DOSED IN MG/24 HOURS) 14 mg/24hr patch Place 1 patch (14 mg total) onto the skin daily. 07/21/22   Massengill, Rankin, MD  OZEMPIC, 2 MG/DOSE, 8 MG/3ML SOPN Inject 1 Dose into the skin every 7 (seven) days. 01/31/23   [provider]    Social History   Socioeconomic History   Marital status: Single    Spouse name: Not on file   Number of children: Not on file   Years of education: Not on file   Highest education level: Not on file  Occupational History   Not on file  Tobacco Use   Smoking status: Every Day    Current packs/day: 0.50    Average packs/day: 0.5 packs/day for 15.0 years (7.5 ttl pk-yrs)    Types: Cigarettes   Smokeless tobacco: Never  Substance and Sexual Activity   Alcohol use: Yes    Alcohol/week: 2.0 standard  drinks of alcohol    Types: 2 Cans of beer per week    Comment: per week. 07/11/22 states does not drink   Drug use: Yes    Types: Cocaine    Comment: Uses weekly. also crack   Sexual activity: Yes  Other Topics Concern   Not on file  Social History Narrative   Lives locally.  Does not routinely exercise.   Social Drivers of Health   Tobacco Use: High Risk (02/22/2023)   Patient History    Smoking Tobacco Use: Every Day    Smokeless Tobacco Use: Never    Passive Exposure: Not on file  Financial Resource Strain: Not on file  Food Insecurity: No Food Insecurity (02/24/2023)   Hunger Vital Sign    Worried About Running  Out of Food in the Last Year: Never true    Ran Out of Food in the Last Year: Never true  Transportation Needs: Patient Declined (02/24/2023)   PRAPARE - Administrator, Civil Service (Medical): Patient declined    Lack of Transportation (Non-Medical): Patient declined  Physical Activity: Not on file  Stress: Not on file  Social Connections: Not on file  Intimate Partner Violence: Patient Declined (02/24/2023)   Humiliation, Afraid, Rape, and Kick questionnaire    Fear of Current or Ex-Partner: Patient declined    Emotionally Abused: Patient declined    Physically Abused: Patient declined    Sexually Abused: Patient declined  Depression (PHQ2-9): Not on file  Alcohol Screen: Low Risk (07/12/2022)   Alcohol Screen    Last Alcohol Screening Score (AUDIT): 0  Housing: Unknown (08/30/2023)   Received from [facility]   Epic    Unable to Pay for Housing in the Last Year: Not on file    Number of Times Moved in the Last Year: Not on file    At any time in the past 12 months, were you homeless or living in a shelter (including now)?: No  Utilities: Patient Declined (02/24/2023)   [facility] Utilities    Threatened with loss of utilities: Patient declined  Health Literacy: Not on file     Family History  Problem Relation Age of Onset   High  blood pressure Father    Diabetes Father    Renal Disease Father     ROS: Otherwise negative unless mentioned in HPI  Physical Examination  Vitals:   01/23/24 1000 01/23/24 1048  BP: (!) 164/108   Pulse: (!) 101   Resp: 20   Temp:  97.9 F (36.6 C)  SpO2: 93%    Body mass index is 18.03 kg/m.  General:  WDWN in NAD Gait: Not observed HENT: WNL, normocephalic Pulmonary: normal non-labored breathing, without Rales, rhonchi,  wheezing Cardiac: regular and Tachycardic, without  Murmurs, rubs or gallops; without carotid bruits. Elevated Blood Pressure of 160/102 Abdomen: Positive bowel sounds throughout, soft, NT/ND, no masses Skin: without rashes Vascular Exam/Pulses: Palpable Pulses throughout.  Extremities: without ischemic changes, without Gangrene , without cellulitis; without open wounds;  Musculoskeletal: no muscle wasting or atrophy. Hx of left sided CVA with weakness. 5/5 motor on the left, 3/3 Motor on the right.   Neurologic: A&O X 3;  No focal weakness or paresthesias are detected; speech is fluent/normal Psychiatric:  The pt has Abnormal- Hx of Schizophrenia  affect. Lymph:  Unremarkable  CBC    Component Value Date/Time   WBC 6.5 01/23/2024 0733   RBC 4.75 01/23/2024 0733   HGB 14.3 01/23/2024 0733   HCT 44.6 01/23/2024 0733   PLT 230 01/23/2024 0733   MCV 93.9 01/23/2024 0733   MCH 30.1 01/23/2024 0733   MCHC 32.1 01/23/2024 0733   RDW 12.1 01/23/2024 0733   LYMPHSABS 0.7 02/23/2023 0648   MONOABS 0.5 02/23/2023 0648   EOSABS 0.0 02/23/2023 0648   BASOSABS 0.0 02/23/2023 0648    BMET    Component Value Date/Time   NA 142 01/23/2024 0846   NA 145 (H) 10/20/2017 1921   K 3.1 (L) 01/23/2024 0846   CL 111 01/23/2024 0846   CO2 22 01/23/2024 0846   GLUCOSE 210 (H) 01/23/2024 0846   BUN 10 01/23/2024 0846   BUN 13 10/20/2017 1921   CREATININE 0.70 01/23/2024 0846   CALCIUM  7.9 (L) 01/23/2024 9153  GFRNONAA >60 01/23/2024 0846   GFRAA 114  10/20/2017 1921    COAGS: Lab Results  Component Value Date   INR 1.2 07/02/2022   INR 1.3 (H) 06/29/2022   INR 1.02 01/30/2016     Non-Invasive Vascular Imaging:   EXAM: CT ANGIOGRAPHY CHEST WITH CONTRAST   TECHNIQUE: Multidetector CT imaging of the chest was performed using the standard protocol during bolus administration of intravenous contrast. Multiplanar CT image reconstructions and MIPs were obtained to evaluate the vascular anatomy.   RADIATION DOSE REDUCTION: This exam was performed according to the departmental dose-optimization program which includes automated exposure control, adjustment of the mA and/or kV according to patient size and/or use of iterative reconstruction technique.   CONTRAST:  75mL OMNIPAQUE  IOHEXOL  350 MG/ML SOLN   COMPARISON:  CTA chest dated 06/29/2022   FINDINGS: Cardiovascular: The study is adequate for the evaluation of pulmonary embolism to the level of mid lobar arteries due to motion artifact and beam hardening. There are multifocal filling defects at the main pulmonary artery bifurcation extending into bilateral main pulmonary arteries. Mildly dilated main pulmonary artery measures 3.2 cm. RV: LV ratio greater than 1. no significant pericardial fluid/thickening. Coronary artery calcifications.   Mediastinum/Nodes: Imaged thyroid  gland without nodules meeting criteria for imaging follow-up by size. Normal esophagus. No pathologically enlarged axillary, supraclavicular, mediastinal, or hilar lymph nodes.   Lungs/Pleura: The central airways are patent. No focal consolidation. No pneumothorax. No pleural effusion.   Upper abdomen: Normal.   Musculoskeletal: No acute or abnormal lytic or blastic osseous lesions. Multilevel degenerative changes of the thoracic spine.   Review of the MIP images confirms the above findings.   IMPRESSION: 1. Multifocal pulmonary emboli at the main pulmonary artery bifurcation extending into  bilateral main pulmonary arteries. 2. RV: LV ratio greater than 1, suggestive of right heart strain. 3. Mildly dilated main pulmonary artery measures 3.2 cm, which can be seen in the setting of pulmonary arterial hypertension. 4. Coronary artery calcifications. Assessment for potential risk factor modification, dietary therapy or pharmacologic therapy may be warranted, if clinically indicated.  Statin:  Yes.   Beta Blocker:  No. Aspirin :  No. ACEI:  No. ARB:  No. CCB use:  Yes Other antiplatelets/anticoagulants:  Yes.   Eliquis  5 mg BID but Ran Out and did not renew.    ASSESSMENT/PLAN: This is a 58 y.o. male who presents to [facility] emergency department due to weakness and multiple falls at home.  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  Upon workup in the emergency department the patient endorses he was on Eliquis  but stopped it because he did not have any more.  The prescription ran out and he never got it refilled.  So with his history of pulmonary embolisms and his history of falling and appearing somewhat short of breath he underwent a CTA of the chest with PE protocol.  Patient was positive for multifocal pulmonary emboli in the main pulmonary arteries.  Venous Doppler ultrasounds were done of his left lower extremity were negative for DVT.  After reviewing the above findings and the CTA of the chest vascular surgery recommends that the patient undergo pulmonary thrombectomy due to increased right heart strain and increased RV to LV ratio greater than 1.  I discussed in detail with the patient at the bedside in the emergency room this morning the procedure, benefits, risk, and complications.  Patient verbalizes  understanding and wishes to proceed.  I answered all his questions this morning.  Patient will be made n.p.o. after midnight tonight for the procedure  tomorrow.  Patient was started on heparin  infusion in the emergency room and that we will continue until he goes for his procedure.  Patient's last blood work this morning at 8:46 AM showed a potassium of 3.1 and a glucose of 210 and a calcium  of 7.9.  Patient's BUN was 10 and creatinine was 0.7 with a GFR greater than 60.  Patient did have an elevated troponin at 155.  Patient's CBC was all within normal limits.  His hemoglobin was 14.3 and hematocrit was 44.6 with platelets of 230.   -I discussed the case in detail with Dr. Cordella Shawl MD and he agrees with the plan.   Gwendlyn JONELLE [facility]ank [facility] 01/23/2024 10:55 AM     [1]  Allergies Allergen Reactions   Haldol  [Haloperidol  Lactate]

## 2024-01-23 NOTE — ED Provider Notes (Addendum)
 [facility] Provider Note    Event Date/Time   First MD Initiated Contact with Patient 01/23/24 0703     (approximate)   History   Chief Complaint: Fall   HPI  Sarim Rothman is a 58 y.o. 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  Denies headache or vision changes, no vomiting or fever.   Was seen here yesterday with reassuring evaluation at that time.  He does now also endorse chest pain, no shortness of breath or cough.  Reports that he finished the blood thinner he was taking for PE.        Past Medical History:  Diagnosis Date   Alcohol abuse    Cocaine abuse (HCC)    Diabetes mellitus without complication (HCC)    Diastolic dysfunction    a. 06/2022 Echo (in setting of acute PE):  EF 50-55%, no rwma, mod-sev LVH, grI DD, mod reduced RV fxn, triv MR w/ MVP of post leaflet, ? bicuspid AoV w/ triv AI and Ao sclerosis.   Hypertension    Pulmonary embolism (HCC)    a. 06/2022 CTA: Large volume PE affecting all lobar braches w/ R heart strain-->s/p mech thrombectomy.   Schizo-affective schizophrenia (HCC)    Stroke [facility])    TIA (transient ischemic attack)    Tobacco abuse     Current Outpatient Rx   Order #: [id number] Class: Normal   Order #: [id number] Class: Normal   Order #: [id number] Class: Normal   Order #: [id number] Class: Normal   Order #: [id number] Class: Normal   Order #: [id number] Class: Normal   Order #: [id number] Class: Normal   Order #: [id number] Class: Historical Med   Order #: [id number] Class: Normal   Order #: [id number] Class: Normal   Order #: [id number] Class: Normal   Order #: [id number] Class: Normal   Order #: [id number] Class: Normal   Order #: [id number] Class: Normal   Order #: [id number] Class: Normal   Order #: [id number] Class: Normal   Order #: [id number] Class: Historical Med    Past Surgical History:   Procedure Laterality Date   PULMONARY THROMBECTOMY Bilateral 06/30/2022   Procedure: PULMONARY THROMBECTOMY;  Surgeon: Jama Cordella MATSU, MD;  Location: [facility] INVASIVE CV LAB;  Service: Cardiovascular;  Laterality: Bilateral;    Physical Exam   Triage Vital Signs: ED Triage Vitals  Encounter Vitals Group     BP 01/23/24 0650 (!) 158/125     Girls Systolic BP Percentile --      Girls Diastolic BP Percentile --      Boys Systolic BP Percentile --      Boys Diastolic BP Percentile --      Pulse Rate 01/23/24 0650 (!) 108     Resp 01/23/24 0650 17     Temp 01/23/24 0650 98 F (36.7 C)     Temp Source 01/23/24 0650 Oral     SpO2 01/23/24 0650 94 %     Weight 01/23/24 0651 115 lb 2 oz (52.2 kg)     Height 01/23/24 0651 5' 7 (1.702 m)     Head Circumference --      Peak Flow --      Pain Score 01/23/24 0651 8     Pain Loc --      Pain Education --      Exclude from Growth Chart --     Most recent vital signs: Vitals:   01/23/24 1000 01/23/24  1048  BP: (!) 164/108   Pulse: (!) 101   Resp: 20   Temp:  97.9 F (36.6 C)  SpO2: 93%     General: Awake, no distress.  CV:  Good peripheral perfusion.  Tachycardia heart rate 110 Resp:  Normal effort.  Clear lungs Abd:  No distention.  Soft nontender Other:  Chronic lymphedema bilateral lower extremities, left greater than right.  No calf tenderness.  Morbidly obese.   ED Results / Procedures / Treatments   Labs (all labs ordered are listed, but only abnormal results are displayed) Labs Reviewed  COMPREHENSIVE METABOLIC PANEL WITH GFR - Abnormal; Notable for the following components:      Result Value   Potassium 3.1 (*)    Glucose, Bld 210 (*)    Calcium  7.9 (*)    Total Protein 5.2 (*)    Albumin 3.2 (*)    AST 13 (*)    All other components within normal limits  TROPONIN T, HIGH SENSITIVITY - Abnormal; Notable for the following components:   Troponin T High Sensitivity 155 (*)    All other components within normal  limits  RESP PANEL BY RT-PCR (RSV, FLU A&B, COVID)  RVPGX2  CBC  APTT  PROTIME-INR  HEPARIN  LEVEL (UNFRACTIONATED)  HEPARIN  LEVEL (UNFRACTIONATED)  APTT     EKG Interpreted by me Sinus tachycardia rate 114, left axis, normal intervals.  Normal QRS ST segments and T waves.   RADIOLOGY X-ray left tibia-fibula interpreted by me, negative for fracture.  Radiology report reviewed Ultrasound left lower extremity negative for DVT CTA chest pending   PROCEDURES:  .Critical Care  Performed by: Viviann Pastor, MD Authorized by: Viviann Pastor, MD   Critical care provider statement:    Critical care time (minutes):  35   Critical care time was exclusive of:  Separately billable procedures and treating other patients   Critical care was necessary to treat or prevent imminent or life-threatening deterioration of the following conditions:  Respiratory failure and circulatory failure   Critical care was time spent personally by me on the following activities:  Development of treatment plan with patient or surrogate, discussions with consultants, evaluation of patient's response to treatment, examination of patient, obtaining history from patient or surrogate, ordering and performing treatments and interventions, ordering and review of laboratory studies, ordering and review of radiographic studies, pulse oximetry, re-evaluation of patient's condition and review of old charts   Care discussed with: admitting provider      MEDICATIONS ORDERED IN ED: Medications  heparin  bolus via infusion 4,000 Units (has no administration in time range)    Followed by  heparin  ADULT infusion 100 units/mL (25000 units/250mL) (has no administration in time range)  sodium chloride  0.9 % bolus 500 mL (0 mLs Intravenous Stopped 01/23/24 1035)  iohexol  (OMNIPAQUE ) 350 MG/ML injection 75 mL (75 mLs Intravenous Contrast Given 01/23/24 1005)     IMPRESSION / MDM / ASSESSMENT AND PLAN / ED COURSE  I  reviewed the triage vital signs and the nursing notes.  DDx: DVT, fracture, PE, pneumonia, UTI, anemia, electrolyte derangement, COVID, influenza, recrudescent stroke symptoms  Patient's presentation is most consistent with acute presentation with potential threat to life or bodily function.  Patient presents with multiple falls which he reports being due to his left leg giving out.  No head injury or loss of consciousness.  Symptoms are compatible with his prior right MCA stroke.  He is tachycardic, oxygen saturation about 92% on room air, with otherwise reassuring  vitals not septic.  Will check labs, CTA chest.   ----------------------------------------- 10:44 AM on 01/23/2024 ----------------------------------------- CT chest reveals bilateral PE, discussed with radiologist, with some evidence of right heart strain.  Blood pressure is normal.  Not in respiratory distress, oxygen saturation about 92% on room air at rest.  Will need to start heparin  infusion, admit for further management.  Clinical Course as of 01/23/24 1055  Mon Jan 23, 2024  1054 Case discussed with vascular surgery who will plan on thrombectomy tomorrow [PS]    Clinical Course User Index [PS] Viviann Pastor, MD     FINAL CLINICAL IMPRESSION(S) / ED DIAGNOSES   Final diagnoses:  Bilateral pulmonary embolism (HCC)  Type 2 diabetes mellitus without complication, with long-term current use of insulin  (HCC)  Morbid obesity (HCC)     Rx / DC Orders   ED Discharge Orders     None        Note:  This document was prepared using Dragon voice recognition software and may include unintentional dictation errors.   Viviann Pastor, MD 01/23/24 1045    Viviann Pastor, MD 01/23/24 1055

## 2024-01-23 NOTE — Plan of Care (Signed)

## 2024-01-23 NOTE — Consult Note (Signed)
 Hospital Consult    Reason for Consult:  Pulmonary Embolism with Rt Heart Strain.  Requesting Physician:  Dr Manus Rouse MD.  MRN #:  [id number]  History of Present Illness: This is a 58 y.o. 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 Denies headache or vision changes, no vomiting or fever. Was seen here yesterday with reassuring evaluation at that time. He does now also endorse chest pain, no shortness of breath or cough.  Patient endorses that he felt good 2 days ago and then yesterday started to feel bad.  Had multiple issues at home.  He endorsed he is not taking Eliquis  anymore it ran out.  While I question as to why he let it run out he said because it did.  Upon workup patient underwent CTA of the chest with PE protocol.  He was noted to have multifocal pulmonary emboli at the main pulmonary artery bifurcation extending into the bilateral main pulmonary arteries.  He is noted to have an RV to LV ratio greater than 1 suggestive of right heart strain.  He is also noted to have mildly dilated main pulmonary arteries which are seen in the setting of pulmonary arterial hypertension.  Patient also underwent left lower extremity venous Doppler ultrasound to check for DVTs.  He was negative for DVT today.  Vascular surgery was consulted to evaluate.  Past Medical History:  Diagnosis Date   Alcohol abuse    Cocaine abuse (HCC)    Diabetes mellitus without complication (HCC)    Diastolic dysfunction    a. 06/2022 Echo (in setting of acute PE):  EF 50-55%, no rwma, mod-sev LVH, grI DD, mod reduced RV fxn, triv MR w/ MVP of post leaflet, ? bicuspid AoV w/ triv AI and Ao sclerosis.   Hypertension    Pulmonary embolism (HCC)    a. 06/2022 CTA: Large volume PE affecting all lobar braches w/ R heart strain-->s/p mech thrombectomy.   Schizo-affective schizophrenia (HCC)     Stroke [facility])    TIA (transient ischemic attack)    Tobacco abuse     Past Surgical History:  Procedure Laterality Date   PULMONARY THROMBECTOMY Bilateral 06/30/2022   Procedure: PULMONARY THROMBECTOMY;  Surgeon: Jama Cordella MATSU, MD;  Location: [facility] INVASIVE CV LAB;  Service: Cardiovascular;  Laterality: Bilateral;    Allergies[1]  Prior to Admission medications  Medication Sig Start Date End Date Taking? Authorizing Provider  albuterol  (VENTOLIN  HFA) 108 (90 Base) MCG/ACT inhaler Inhale 2-4 puffs into the lungs every 4 (four) hours as needed for wheezing or shortness of breath. 01/27/23   Suzanne Kirsch, MD  amLODipine  (NORVASC ) 10 MG tablet Take 1 tablet (10 mg total) by mouth daily. 07/21/22 08/20/22  Massengill, Rankin, MD  apixaban  (ELIQUIS ) 5 MG TABS tablet Take 1 tablet (5 mg total) by mouth every 12 (twelve) hours. 07/20/22 08/19/22  Massengill, Rankin, MD  ARIPiprazole  (ABILIFY ) 15 MG tablet Take 1 tablet (15 mg total) by mouth daily. 07/21/22 08/20/22  Massengill, Rankin, MD  atorvastatin  (LIPITOR) 40 MG tablet Take 1 tablet (40 mg total) by mouth daily. Patient not taking: Reported on 02/22/2023 07/20/22   Johny Rankin, MD  Blood Glucose Monitoring Suppl DEVI 1 each by Does not apply route 3 (three) times daily. May dispense any manufacturer covered by patient's insurance. 07/28/22   Poggi, Jenna E, PA-C  Fluticasone-Umeclidin-Vilant (TRELEGY ELLIPTA ) 100-62.5-25 MCG/ACT AEPB  Inhale 1 application  into the lungs daily. 02/26/23   Awanda City, MD  gabapentin  (NEURONTIN ) 600 MG tablet Take 600 mg by mouth 3 (three) times daily. 11/18/22   [provider]  Glucose Blood (BLOOD GLUCOSE TEST STRIPS) STRP 1 each by Does not apply route 3 (three) times daily. Use as directed to check blood sugar. May dispense any manufacturer covered by patient's insurance and fits patient's device. 02/26/23   Awanda City, MD  insulin  aspart (NOVOLOG ) 100 UNIT/ML FlexPen If eating and Blood Glucose  (BG) 80 or higher inject 4 units for meal coverage and add correction dose per scale. If not eating, correction dose only. BG <150= 0 unit; BG 150-200= 1 unit; BG 201-250= 2 unit; BG 251-300= 3 unit; BG 301-350= 4 unit; BG 351-400= 5 unit; BG >400= 6 unit and Call Primary care. 02/26/23   Awanda City, MD  insulin  glargine (LANTUS ) 100 UNIT/ML Solostar Pen Inject 25 Units into the skin daily. Increased from 20 unit daily. 02/26/23   Awanda City, MD  Insulin  Pen Needle (PEN NEEDLES) 31G X 5 MM MISC 1 each by Does not apply route 3 (three) times daily. May dispense any manufacturer covered by patient's insurance. 02/26/23   Awanda City, MD  ipratropium-albuterol  (DUONEB) 0.5-2.5 (3) MG/3ML SOLN Take 3 mLs by nebulization every 6 (six) hours as needed. 02/26/23   Awanda City, MD  Lancet Device MISC 1 each by Does not apply route 3 (three) times daily. May dispense any manufacturer covered by patient's insurance. 02/26/23   Awanda City, MD  Lancets MISC 1 each by Does not apply route 3 (three) times daily. Use as directed to check blood sugar. May dispense any manufacturer covered by patient's insurance and fits patient's device. 02/26/23   Awanda City, MD  nicotine  (NICODERM CQ  - DOSED IN MG/24 HOURS) 14 mg/24hr patch Place 1 patch (14 mg total) onto the skin daily. 07/21/22   Massengill, Rankin, MD  OZEMPIC, 2 MG/DOSE, 8 MG/3ML SOPN Inject 1 Dose into the skin every 7 (seven) days. 01/31/23   [provider]    Social History   Socioeconomic History   Marital status: Single    Spouse name: Not on file   Number of children: Not on file   Years of education: Not on file   Highest education level: Not on file  Occupational History   Not on file  Tobacco Use   Smoking status: Every Day    Current packs/day: 0.50    Average packs/day: 0.5 packs/day for 15.0 years (7.5 ttl pk-yrs)    Types: Cigarettes   Smokeless tobacco: Never  Substance and Sexual Activity   Alcohol use: Yes    Alcohol/week: 2.0 standard  drinks of alcohol    Types: 2 Cans of beer per week    Comment: per week. 07/11/22 states does not drink   Drug use: Yes    Types: Cocaine    Comment: Uses weekly. also crack   Sexual activity: Yes  Other Topics Concern   Not on file  Social History Narrative   Lives locally.  Does not routinely exercise.   Social Drivers of Health   Tobacco Use: High Risk (02/22/2023)   Patient History    Smoking Tobacco Use: Every Day    Smokeless Tobacco Use: Never    Passive Exposure: Not on file  Financial Resource Strain: Not on file  Food Insecurity: No Food Insecurity (02/24/2023)   Hunger Vital Sign    Worried About Running  Out of Food in the Last Year: Never true    Ran Out of Food in the Last Year: Never true  Transportation Needs: Patient Declined (02/24/2023)   PRAPARE - Administrator, Civil Service (Medical): Patient declined    Lack of Transportation (Non-Medical): Patient declined  Physical Activity: Not on file  Stress: Not on file  Social Connections: Not on file  Intimate Partner Violence: Patient Declined (02/24/2023)   Humiliation, Afraid, Rape, and Kick questionnaire    Fear of Current or Ex-Partner: Patient declined    Emotionally Abused: Patient declined    Physically Abused: Patient declined    Sexually Abused: Patient declined  Depression (PHQ2-9): Not on file  Alcohol Screen: Low Risk (07/12/2022)   Alcohol Screen    Last Alcohol Screening Score (AUDIT): 0  Housing: Unknown (08/30/2023)   Received from [facility]   Epic    Unable to Pay for Housing in the Last Year: Not on file    Number of Times Moved in the Last Year: Not on file    At any time in the past 12 months, were you homeless or living in a shelter (including now)?: No  Utilities: Patient Declined (02/24/2023)   [facility] Utilities    Threatened with loss of utilities: Patient declined  Health Literacy: Not on file     Family History  Problem Relation Age of Onset   High  blood pressure Father    Diabetes Father    Renal Disease Father     ROS: Otherwise negative unless mentioned in HPI  Physical Examination  Vitals:   01/23/24 1000 01/23/24 1048  BP: (!) 164/108   Pulse: (!) 101   Resp: 20   Temp:  97.9 F (36.6 C)  SpO2: 93%    Body mass index is 18.03 kg/m.  General:  WDWN in NAD Gait: Not observed HENT: WNL, normocephalic Pulmonary: normal non-labored breathing, without Rales, rhonchi,  wheezing Cardiac: regular and Tachycardic, without  Murmurs, rubs or gallops; without carotid bruits. Elevated Blood Pressure of 160/102 Abdomen: Positive bowel sounds throughout, soft, NT/ND, no masses Skin: without rashes Vascular Exam/Pulses: Palpable Pulses throughout.  Extremities: without ischemic changes, without Gangrene , without cellulitis; without open wounds;  Musculoskeletal: no muscle wasting or atrophy. Hx of left sided CVA with weakness. 5/5 motor on the left, 3/3 Motor on the right.   Neurologic: A&O X 3;  No focal weakness or paresthesias are detected; speech is fluent/normal Psychiatric:  The pt has Abnormal- Hx of Schizophrenia  affect. Lymph:  Unremarkable  CBC    Component Value Date/Time   WBC 6.5 01/23/2024 0733   RBC 4.75 01/23/2024 0733   HGB 14.3 01/23/2024 0733   HCT 44.6 01/23/2024 0733   PLT 230 01/23/2024 0733   MCV 93.9 01/23/2024 0733   MCH 30.1 01/23/2024 0733   MCHC 32.1 01/23/2024 0733   RDW 12.1 01/23/2024 0733   LYMPHSABS 0.7 02/23/2023 0648   MONOABS 0.5 02/23/2023 0648   EOSABS 0.0 02/23/2023 0648   BASOSABS 0.0 02/23/2023 0648    BMET    Component Value Date/Time   NA 142 01/23/2024 0846   NA 145 (H) 10/20/2017 1921   K 3.1 (L) 01/23/2024 0846   CL 111 01/23/2024 0846   CO2 22 01/23/2024 0846   GLUCOSE 210 (H) 01/23/2024 0846   BUN 10 01/23/2024 0846   BUN 13 10/20/2017 1921   CREATININE 0.70 01/23/2024 0846   CALCIUM  7.9 (L) 01/23/2024 9153  GFRNONAA >60 01/23/2024 0846   GFRAA 114  10/20/2017 1921    COAGS: Lab Results  Component Value Date   INR 1.2 07/02/2022   INR 1.3 (H) 06/29/2022   INR 1.02 01/30/2016     Non-Invasive Vascular Imaging:   EXAM: CT ANGIOGRAPHY CHEST WITH CONTRAST   TECHNIQUE: Multidetector CT imaging of the chest was performed using the standard protocol during bolus administration of intravenous contrast. Multiplanar CT image reconstructions and MIPs were obtained to evaluate the vascular anatomy.   RADIATION DOSE REDUCTION: This exam was performed according to the departmental dose-optimization program which includes automated exposure control, adjustment of the mA and/or kV according to patient size and/or use of iterative reconstruction technique.   CONTRAST:  75mL OMNIPAQUE  IOHEXOL  350 MG/ML SOLN   COMPARISON:  CTA chest dated 06/29/2022   FINDINGS: Cardiovascular: The study is adequate for the evaluation of pulmonary embolism to the level of mid lobar arteries due to motion artifact and beam hardening. There are multifocal filling defects at the main pulmonary artery bifurcation extending into bilateral main pulmonary arteries. Mildly dilated main pulmonary artery measures 3.2 cm. RV: LV ratio greater than 1. no significant pericardial fluid/thickening. Coronary artery calcifications.   Mediastinum/Nodes: Imaged thyroid  gland without nodules meeting criteria for imaging follow-up by size. Normal esophagus. No pathologically enlarged axillary, supraclavicular, mediastinal, or hilar lymph nodes.   Lungs/Pleura: The central airways are patent. No focal consolidation. No pneumothorax. No pleural effusion.   Upper abdomen: Normal.   Musculoskeletal: No acute or abnormal lytic or blastic osseous lesions. Multilevel degenerative changes of the thoracic spine.   Review of the MIP images confirms the above findings.   IMPRESSION: 1. Multifocal pulmonary emboli at the main pulmonary artery bifurcation extending into  bilateral main pulmonary arteries. 2. RV: LV ratio greater than 1, suggestive of right heart strain. 3. Mildly dilated main pulmonary artery measures 3.2 cm, which can be seen in the setting of pulmonary arterial hypertension. 4. Coronary artery calcifications. Assessment for potential risk factor modification, dietary therapy or pharmacologic therapy may be warranted, if clinically indicated.  Statin:  Yes.   Beta Blocker:  No. Aspirin :  No. ACEI:  No. ARB:  No. CCB use:  Yes Other antiplatelets/anticoagulants:  Yes.   Eliquis  5 mg BID but Ran Out and did not renew.    ASSESSMENT/PLAN: This is a 58 y.o. male who presents to [facility] emergency department due to weakness and multiple falls at home.  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  Upon workup in the emergency department the patient endorses he was on Eliquis  but stopped it because he did not have any more.  The prescription ran out and he never got it refilled.  So with his history of pulmonary embolisms and his history of falling and appearing somewhat short of breath he underwent a CTA of the chest with PE protocol.  Patient was positive for multifocal pulmonary emboli in the main pulmonary arteries.  Venous Doppler ultrasounds were done of his left lower extremity were negative for DVT.  After reviewing the above findings and the CTA of the chest vascular surgery recommends that the patient undergo pulmonary thrombectomy due to increased right heart strain and increased RV to LV ratio greater than 1.  I discussed in detail with the patient at the bedside in the emergency room this morning the procedure, benefits, risk, and complications.  Patient verbalizes  understanding and wishes to proceed.  I answered all his questions this morning.  Patient will be made n.p.o. after midnight tonight for the procedure  tomorrow.  Patient was started on heparin  infusion in the emergency room and that we will continue until he goes for his procedure.  Patient's last blood work this morning at 8:46 AM showed a potassium of 3.1 and a glucose of 210 and a calcium  of 7.9.  Patient's BUN was 10 and creatinine was 0.7 with a GFR greater than 60.  Patient did have an elevated troponin at 155.  Patient's CBC was all within normal limits.  His hemoglobin was 14.3 and hematocrit was 44.6 with platelets of 230.   -I discussed the case in detail with Dr. Cordella Shawl MD and he agrees with the plan.   Gwendlyn JONELLE [facility]ank [facility] 01/23/2024 10:55 AM     [1]  Allergies Allergen Reactions   Haldol  [Haloperidol  Lactate]

## 2024-01-23 NOTE — ED Notes (Signed)
Called lab to add on INR 

## 2024-01-23 NOTE — Consult Note (Signed)
 PHARMACY - ANTICOAGULATION CONSULT NOTE  Pharmacy Consult for IV Heparin  Indication: pulmonary embolus  Allergies[1]  Patient Measurements: Height: 5' 7 (170.2 cm) Weight: 52.2 kg (115 lb 2 oz) IBW/kg (Calculated) : 66.1 HEPARIN  DW (KG): 52.2  Vital Signs: Temp: 98 F (36.7 C) (12/15 1627) Temp Source: Oral (12/15 1627) BP: 131/88 (12/15 1627) Pulse Rate: 90 (12/15 1627)  Labs: Recent Labs    01/22/24 1105 01/23/24 0733 01/23/24 0846 01/23/24 1746  HGB 12.9* 14.3  --   --   HCT 40.1 44.6  --   --   PLT 201 230  --   --   APTT  --   --  31 51*  LABPROT  --   --  15.7*  --   INR  --   --  1.2  --   HEPARINUNFRC  --   --  0.80*  --   CREATININE 1.10  --  0.70  --     Estimated Creatinine Clearance: 74.3 mL/min (by C-G formula based on SCr of 0.7 mg/dL).   Medical History: Past Medical History:  Diagnosis Date   Alcohol abuse    Cocaine abuse (HCC)    Diabetes mellitus without complication (HCC)    Diastolic dysfunction    a. 06/2022 Echo (in setting of acute PE):  EF 50-55%, no rwma, mod-sev LVH, grI DD, mod reduced RV fxn, triv MR w/ MVP of post leaflet, ? bicuspid AoV w/ triv AI and Ao sclerosis.   Hypertension    Pulmonary embolism (HCC)    a. 06/2022 CTA: Large volume PE affecting all lobar braches w/ R heart strain-->s/p mech thrombectomy.   Schizo-affective schizophrenia (HCC)    Stroke (HCC)    TIA (transient ischemic attack)    Tobacco abuse     Medications:  Medication reconciliation is pending. Appears patient was on apixaban  in the past but appears Rx expired 08/19/2022.  Assessment: 58 y/o M with medical history as above and including PE in 06/2022 no longer on blood thinners diagnosed with PE in the emergency department on 01/23/24. Pharmacy consulted to dose heparin .   Baseline aPTT, INR and heparin  level are pending. Baseline CBC within normal limits  12/15 1746 aPTT 51, subtherapeutic  Goal of Therapy:  Heparin  level 0.3-0.7  units/ml Monitor platelets by anticoagulation protocol: Yes   Plan:  --Give 1500 unit bolus x 1 --Increase heparin  infusion to 1050 units/hr --Check a aPTT 6 hours from rate change. Monitor heparin  level daily. Switch to heparin  level monitoring with the two correlate --Daily CBC per protocol while on IV heparin   Lum VEAR Mania, PharmD, BCPS 01/23/2024,6:25 PM      [1]  Allergies Allergen Reactions   Haldol  [Haloperidol  Lactate]

## 2024-01-23 NOTE — ED Notes (Signed)
 Called lab for add-on APTT

## 2024-01-23 NOTE — ED Notes (Signed)
 Bloodwork resent to lab per their request due to hemolyzation.

## 2024-01-23 NOTE — ED Triage Notes (Signed)
 Pt BIB [facility] from home for 4 falls in 8 hours. Seen here after first fall. + thinners, -head strike, - LOC. Pt c/o rt shoulder pain. Hx HTN, DM. EMS VS 116 162/109 94% 98.2

## 2024-01-23 NOTE — ED Notes (Signed)
 3+ pitting edema noted in lower extremities. Pt c/o right shoulder pain, is calm, AOX4.

## 2024-01-24 ENCOUNTER — Encounter: Admission: EM | Disposition: A | Payer: Self-pay | Source: Home / Self Care | Attending: Family Medicine

## 2024-01-24 ENCOUNTER — Encounter: Payer: Self-pay | Admitting: Vascular Surgery

## 2024-01-24 ENCOUNTER — Inpatient Hospital Stay: Admit: 2024-01-24 | Discharge: 2024-01-24 | Disposition: A | Attending: Vascular Surgery | Admitting: Vascular Surgery

## 2024-01-24 ENCOUNTER — Inpatient Hospital Stay: Admit: 2024-01-24

## 2024-01-24 DIAGNOSIS — I50810 Right heart failure, unspecified: Secondary | ICD-10-CM

## 2024-01-24 HISTORY — PX: PULMONARY THROMBECTOMY: CATH118295

## 2024-01-24 LAB — CBC
HCT: 38.7 % — ABNORMAL LOW (ref 39.0–52.0)
Hemoglobin: 12.5 g/dL — ABNORMAL LOW (ref 13.0–17.0)
MCH: 30.1 pg (ref 26.0–34.0)
MCHC: 32.3 g/dL (ref 30.0–36.0)
MCV: 93.3 fL (ref 80.0–100.0)
Platelets: 197 K/uL (ref 150–400)
RBC: 4.15 MIL/uL — ABNORMAL LOW (ref 4.22–5.81)
RDW: 12.4 % (ref 11.5–15.5)
WBC: 4.6 K/uL (ref 4.0–10.5)
nRBC: 0 % (ref 0.0–0.2)

## 2024-01-24 LAB — BASIC METABOLIC PANEL WITH GFR
Anion gap: 9 (ref 5–15)
BUN: 12 mg/dL (ref 6–20)
CO2: 26 mmol/L (ref 22–32)
Calcium: 8.9 mg/dL (ref 8.9–10.3)
Chloride: 105 mmol/L (ref 98–111)
Creatinine, Ser: 0.79 mg/dL (ref 0.61–1.24)
GFR, Estimated: >60 mL/min (ref >60)
Glucose, Bld: 125 mg/dL — ABNORMAL HIGH (ref 70–99)
Potassium: 3 mmol/L — ABNORMAL LOW (ref 3.5–5.1)
Sodium: 141 mmol/L (ref 135–145)

## 2024-01-24 LAB — APTT: aPTT: 67 s — ABNORMAL HIGH (ref 24–36)

## 2024-01-24 LAB — GLUCOSE, CAPILLARY
Glucose-Capillary: 145 mg/dL — ABNORMAL HIGH (ref 70–99)
Glucose-Capillary: 146 mg/dL — ABNORMAL HIGH (ref 70–99)
Glucose-Capillary: 143 mg/dL — ABNORMAL HIGH (ref 70–99)
Glucose-Capillary: 236 mg/dL — ABNORMAL HIGH (ref 70–99)
Glucose-Capillary: 258 mg/dL — ABNORMAL HIGH (ref 70–99)

## 2024-01-24 LAB — HEPARIN LEVEL (UNFRACTIONATED)
Heparin Unfractionated: 0.46 [IU]/mL (ref 0.30–0.70)
Heparin Unfractionated: 0.35 [IU]/mL (ref 0.30–0.70)

## 2024-01-24 SURGERY — PULMONARY THROMBECTOMY
Anesthesia: Moderate Sedation | Laterality: Bilateral

## 2024-01-24 MED ORDER — CEFAZOLIN SODIUM-DEXTROSE 2-4 GM/100ML-% IV SOLN
2.0000 g | INTRAVENOUS | Status: AC
  Administered 2024-01-24: 09:00:00 2 g via INTRAVENOUS
  Filled 2024-01-24: qty 100

## 2024-01-24 MED ORDER — HEPARIN SODIUM (PORCINE) 1000 UNIT/ML IJ SOLN
INTRAMUSCULAR | Status: DC | PRN
  Administered 2024-01-24: 10:00:00 4000 [IU] via INTRAVENOUS

## 2024-01-24 MED ORDER — IODIXANOL 320 MG/ML IV SOLN
INTRAVENOUS | Status: DC | PRN
  Administered 2024-01-24: 10:00:00 50 mL

## 2024-01-24 MED ORDER — CHLORHEXIDINE GLUCONATE CLOTH 2 % EX PADS
6.0000 | MEDICATED_PAD | Freq: Every day | CUTANEOUS | Status: DC
  Administered 2024-01-24 – 2024-01-25 (×2): 6 via TOPICAL

## 2024-01-24 MED ORDER — HEPARIN (PORCINE) 25000 UT/250ML-% IV SOLN
1250.0000 [IU]/h | INTRAVENOUS | Status: DC
  Administered 2024-01-25: 11:00:00 1250 [IU]/h via INTRAVENOUS
  Filled 2024-01-24: qty 250

## 2024-01-24 MED ORDER — ALTEPLASE 2 MG IJ SOLR
INTRAMUSCULAR | Status: DC | PRN
  Administered 2024-01-24 (×2): 5 mg

## 2024-01-24 MED ORDER — FAMOTIDINE 20 MG PO TABS: 40.0000 mg | ORAL_TABLET | Freq: Once | ORAL | Status: DC | PRN

## 2024-01-24 MED ORDER — LIVING WELL WITH DIABETES BOOK
Freq: Once | Status: AC
  Filled 2024-01-24: qty 1

## 2024-01-24 MED ORDER — FOLIC ACID 1 MG PO TABS
1.0000 mg | ORAL_TABLET | Freq: Every day | ORAL | Status: DC
  Administered 2024-01-25: 10:00:00 1 mg via ORAL
  Filled 2024-01-24: qty 1

## 2024-01-24 MED ORDER — POTASSIUM CHLORIDE CRYS ER 20 MEQ PO TBCR
40.0000 meq | EXTENDED_RELEASE_TABLET | Freq: Once | ORAL | Status: AC
  Administered 2024-01-24: 15:00:00 40 meq via ORAL
  Filled 2024-01-24: qty 2

## 2024-01-24 MED ORDER — AMLODIPINE BESYLATE 10 MG PO TABS
10.0000 mg | ORAL_TABLET | Freq: Every day | ORAL | Status: DC
  Administered 2024-01-24 – 2024-01-25 (×2): 10 mg via ORAL
  Filled 2024-01-24 (×2): qty 1

## 2024-01-24 MED ORDER — ATORVASTATIN CALCIUM 20 MG PO TABS
40.0000 mg | ORAL_TABLET | Freq: Every day | ORAL | Status: DC
  Administered 2024-01-24 – 2024-01-25 (×2): 40 mg via ORAL
  Filled 2024-01-24 (×2): qty 2

## 2024-01-24 MED ORDER — MIDAZOLAM HCL 2 MG/ML PO SYRP: 8.0000 mg | ORAL_SOLUTION | Freq: Once | ORAL | Status: DC | PRN

## 2024-01-24 MED ORDER — METHYLPREDNISOLONE SODIUM SUCC 125 MG IJ SOLR: 125.0000 mg | Freq: Once | INTRAMUSCULAR | Status: DC | PRN

## 2024-01-24 MED ORDER — CEFAZOLIN SODIUM-DEXTROSE 2-4 GM/100ML-% IV SOLN
INTRAVENOUS | Status: AC
  Filled 2024-01-24: qty 100

## 2024-01-24 MED ORDER — HEPARIN SODIUM (PORCINE) 1000 UNIT/ML IJ SOLN
INTRAMUSCULAR | Status: AC
  Filled 2024-01-24: qty 10

## 2024-01-24 MED ORDER — DIPHENHYDRAMINE HCL 50 MG/ML IJ SOLN: 50.0000 mg | Freq: Once | INTRAMUSCULAR | Status: DC | PRN

## 2024-01-24 MED ORDER — GLUCERNA SHAKE PO LIQD
237.0000 mL | Freq: Three times a day (TID) | ORAL | Status: DC
  Administered 2024-01-24 – 2024-01-25 (×4): 237 mL via ORAL

## 2024-01-24 MED ORDER — HEPARIN (PORCINE) IN NACL 1000-0.9 UT/500ML-% IV SOLN
INTRAVENOUS | Status: DC | PRN
  Administered 2024-01-24: 10:00:00 1000 mL

## 2024-01-24 MED ORDER — FENTANYL CITRATE (PF) 100 MCG/2ML IJ SOLN
INTRAMUSCULAR | Status: DC | PRN
  Administered 2024-01-24: 09:00:00 50 ug via INTRAVENOUS

## 2024-01-24 MED ORDER — ADULT MULTIVITAMIN W/MINERALS CH
1.0000 | ORAL_TABLET | Freq: Every day | ORAL | Status: DC
  Administered 2024-01-25: 10:00:00 1 via ORAL
  Filled 2024-01-24: qty 1

## 2024-01-24 MED ORDER — FENTANYL CITRATE (PF) 100 MCG/2ML IJ SOLN
INTRAMUSCULAR | Status: AC
  Filled 2024-01-24: qty 2

## 2024-01-24 MED ORDER — BUDESON-GLYCOPYRROL-FORMOTEROL 160-9-4.8 MCG/ACT IN AERO
2.0000 | INHALATION_SPRAY | Freq: Two times a day (BID) | RESPIRATORY_TRACT | Status: DC
  Administered 2024-01-24 – 2024-01-25 (×2): 2 via RESPIRATORY_TRACT
  Filled 2024-01-24: qty 5.9

## 2024-01-24 MED ORDER — MIDAZOLAM HCL 5 MG/5ML IJ SOLN
INTRAMUSCULAR | Status: AC
  Filled 2024-01-24: qty 5

## 2024-01-24 MED ORDER — PERFLUTREN LIPID MICROSPHERE
1.0000 mL | INTRAVENOUS | Status: AC | PRN
  Administered 2024-01-24: 15:00:00 2 mL via INTRAVENOUS

## 2024-01-24 MED ORDER — THIAMINE HCL 100 MG PO TABS
100.0000 mg | ORAL_TABLET | Freq: Every day | ORAL | Status: DC
  Administered 2024-01-25: 10:00:00 100 mg via ORAL
  Filled 2024-01-24 (×2): qty 1

## 2024-01-24 MED ORDER — LIDOCAINE HCL (PF) 1 % IJ SOLN
INTRAMUSCULAR | Status: DC | PRN
  Administered 2024-01-24: 10:00:00 10 mL

## 2024-01-24 MED ORDER — MIDAZOLAM HCL (PF) 2 MG/2ML IJ SOLN
INTRAMUSCULAR | Status: DC | PRN
  Administered 2024-01-24: 09:00:00 2 mg via INTRAVENOUS

## 2024-01-24 SURGICAL SUPPLY — 21 items
CANISTER PENUMBRA ENGINE (MISCELLANEOUS) IMPLANT
CATH ANGIO 5F PIGTAIL 100CM (CATHETERS) IMPLANT
CATH INDIGO SEP 8 (CATHETERS) IMPLANT
CATH INFINITI JR4 5F (CATHETERS) IMPLANT
CATH LIGHTNING 8 XTORQ 115 (CATHETERS) IMPLANT
CATH SELECT H1 TIP 5F 130 (CATHETERS) IMPLANT
CLOSURE PERCLOSE PROSTYLE (Vascular Products) IMPLANT
COVER PROBE ULTRASOUND 5X96 (MISCELLANEOUS) IMPLANT
GLIDEWIRE ANGLED SS 035X260CM (WIRE) IMPLANT
NDL ENTRY 21GA 7CM ECHOTIP (NEEDLE) IMPLANT
NEEDLE ENTRY 21GA 7CM ECHOTIP (NEEDLE) IMPLANT
PACK ANGIOGRAPHY (CUSTOM PROCEDURE TRAY) ×1 IMPLANT
SET INTRO CAPELLA COAXIAL (SET/KITS/TRAYS/PACK) IMPLANT
SHEATH 9FRX11 (SHEATH) IMPLANT
SHEATH BRITE TIP 6FRX11 (SHEATH) IMPLANT
SUT MNCRL AB 4-0 PS2 18 (SUTURE) IMPLANT
SUT SILK 0 FSL (SUTURE) IMPLANT
SYR MEDRAD MARK 7 150ML (SYRINGE) IMPLANT
TUBING CONTRAST HIGH PRESS 72 (TUBING) IMPLANT
WIRE AMPLATZ SSTIFF .035X260CM (WIRE) IMPLANT
WIRE J 3MM .035X145CM (WIRE) IMPLANT

## 2024-01-24 NOTE — Progress Notes (Addendum)
 Initial Nutrition Assessment  DOCUMENTATION CODES:   Underweight  INTERVENTION:   Glucerna Shake po TID, each supplement provides 220 kcal and 10 grams of protein  MVI po daily   Thiamine  100mg  po daily   Folic acid  1mg  po daily   Pt at high refeed risk; recommend monitor potassium, magnesium  and phosphorus labs daily until stable  Daily weights   NUTRITION DIAGNOSIS:   Increased nutrient needs related to catabolic illness as evidenced by estimated needs.  GOAL:   Patient will meet greater than or equal to 90% of their needs  MONITOR:   PO intake, Supplement acceptance, Labs, Weight trends, I & O's, Skin  REASON FOR ASSESSMENT:   Malnutrition Screening Tool    ASSESSMENT:   58 y/o male with a h/o HTN, substance abuse, CVA/TIA, MDD, DM, neuropathy, schizophrenia, COPD, HLD, CHF and frequent falls who is admitted with massive PE now s/p thrombectomy 12/16.  RD unable to see patient today as pt having a procedure at the time of RD visit. Per chart, pt is down 22lbs(16%) since January; RD unsure how recently weight loss occurred. Pt is at high risk for malnutrition. RD will follow up to obtain exam and history. RD will add supplements and MVI to help pt meet his estimated needs. Pt is at high refeed risk.   Medications reviewed and include: insulin , heparin   Labs reviewed: K 3.0(L) Cbgs- 143, 146, 145 x 24 hrs  AIC 12.7(H)- 12/15  UOP-   NUTRITION - FOCUSED PHYSICAL EXAM: Unable to perform at this time   Diet Order:   Diet Order             Diet regular Room service appropriate? Yes; Fluid consistency: Thin  Diet effective now                  EDUCATION NEEDS:   No education needs have been identified at this time  Skin:  Skin Assessment: Reviewed RN Assessment  Last BM:  pta  Height:   Ht Readings from Last 1 Encounters:  01/23/24 5' 7 (1.702 m)    Weight:   Wt Readings from Last 1 Encounters:  01/23/24 52.2 kg    Ideal Body  Weight:  67.2 kg  BMI:  Body mass index is 18.03 kg/m.  Estimated Nutritional Needs:   Kcal:  1800-2100kcal/day  Protein:  90-105g/day  Fluid:  1.6-1.8L/day  Augustin Shams MS, RD, LDN If unable to be reached, please send secure chat to RD inpatient available from 8:00a-4:00p daily

## 2024-01-24 NOTE — Progress Notes (Signed)
 PHARMACY CONSULT NOTE - FOLLOW UP  Pharmacy Consult for Electrolyte Monitoring and Replacement   Recent Labs: Potassium (mmol/L)  Date Value  01/24/2024 3.0 (L)   Magnesium  (mg/dL)  Date Value  94/74/7975 1.9   Calcium  (mg/dL)  Date Value  87/83/7974 8.9   Albumin (g/dL)  Date Value  87/84/7974 3.2 (L)  10/20/2017 3.8   Phosphorus (mg/dL)  Date Value  94/78/7975 4.2   Sodium (mmol/L)  Date Value  01/24/2024 141  10/20/2017 145 (H)     Assessment: 58 y/o male with a h/o HTN, substance abuse, CVA/TIA, MDD, DM, neuropathy, schizophrenia, COPD, HLD, CHF and frequent falls who is admitted with massive PE now s/p thrombectomy 12/16. Pharmacy is asked to follow and replace electrolytes while in CCU  Goal of Therapy:  Electrolytes WNL  Plan:  ---40 mEq po KCl x 1 ---recheck electrolytes in am  Adriana JONETTA Bolster ,PharmD Clinical Pharmacist 01/24/2024 12:11 PM

## 2024-01-24 NOTE — Op Note (Signed)
 [facility]  Percutaneous Study/Intervention Procedural Note   Date of Surgery: 01/24/2024,10:18 AM  Surgeon:Shakyra Mattera, Cordella MATSU   Pre-operative Diagnosis: Symptomatic pulmonary emboli with right heart strain and hypoxia  Post-operative diagnosis:  Same  Procedure(s) Performed:  1.  Contrast injection right heart and bilateral pulmonary arteries  2.  Thrombolysis bilateral pulmonary arteries with 10 mg of TPA  3.  Mechanical thrombectomy bilateral lobar pulmonary arteries for removal of pulmonary emboli using the Penumbra CAT 8 thrombectomy catheter.  4.  Selective catheter placement right upper lobe pulmonary artery, middle lobe pulmonary artery and lower lobe pulmonary artery  5.  Selective catheter placement left upper lobe pulmonary artery and lower lobe pulmonary artery    Anesthesia: Conscious sedation was administered under my direct supervision by the interventional radiology RN. IV Versed  plus fentanyl  were utilized. Continuous ECG, pulse oximetry and blood pressure was monitored throughout the entire procedure.  Versed  and fentanyl  were administered intravenously.  Conscious sedation was administered for a total of 47 minutes.  Sheath: 9 French Pinnacle right common femoral vein antegrade  Contrast: 50 cc   Fluoroscopy Time: 12.4 minutes  EBL: 300 cc  Indications:  Patient presents with pulmonary emboli. The patient is symptomatic with hypoxemia and dyspnea on exertion.  There is evidence of right heart strain on the CT angiogram. The patient is otherwise a good candidate for intervention and even the long-term benefits pulmonary angiography with thrombolysis is offered. The risks and benefits are reviewed long-term benefits are discussed. All questions are answered patient agrees to proceed.  Procedure:  Henry Viner Stewartis a 58 y.o. male who was identified and appropriate procedural time out was performed.  The patient was then placed supine on the  table and prepped and draped in the usual sterile fashion.  Ultrasound was used to evaluate the right common femoral vein.  It was patent, as it was echolucent and compressible.  A digital ultrasound image was acquired for the permanent record.  A micropuncture needle was used to access the right common femoral vein under direct ultrasound guidance.  A microwire was then advanced under fluoroscopic guidance followed by micro-sheath.  A 0.035 J wire was advanced without resistance and a 5Fr sheath was placed.  Perclose devices were then used in a preclose fashion and then upsized to an 9 French sheath.    The wire and pigtail catheter were then negotiated into the right atrium and bolus injection of contrast was utilized to demonstrate the right ventricle and the pulmonary artery outflow.   4000 units of heparin  was then given and allowed to circulate.  TPA was reconstituted and delivered onto the table. A total of 10 milligrams of TPA was utilized.  5 mg was administered on the left side and 5 mg was administered on the right side. This was then allowed to dwell for 20-30 minutes.  The J-wire and pigtail catheter was advanced up to the right atrium where a bolus injection contrast was used to demonstrate the pulmonary artery outflow.  Stiff angled Glidewire was then exchanged for the J-wire and the pigtail catheter was used to select the pulmonary outflow track.  The left main pulmonary artery was evaluated first.  The pigtail catheter was then advanced into the left main pulmonary artery.  Then with the catheter in the left main pulmonary artery bolus injection contrast was utilized to demonstrate the thrombus as well as the segmental pulmonary artery vasculature. This demonstrated thrombus in the distal left main pulmonary artery  extending into the left upper lobe artery as well as the left lower lobe artery and noting that it was near occlusive.  The pigtail catheter was then advanced out so that it was  positioned within the thrombus and 5 milligrams of TPA were infused directly into the clot.  After an appropriate dwell time the Amplatz wire was reintroduced through the pigtail catheter and the pigtail catheter removed.  The Penumbra Cat 8 extra torque catheter was then advanced into the thrombus in the left lower lobe pulmonary artery.  Hand-injection contrast was used to verify the positioning and evaluate the distal anatomy.  Mechanical aspiration was performed using the CAT 8 catheter and a separator.  After multiple passes the catheter was then repositioned into the left upper lobe pulmonary artery and again hand-injection contrast was performed to verify positioning and evaluate the distal anatomy.  Multiple passes were made using mechanical aspiration in association with a separator.  Once there was free flow of blood from both lobar arteries the catheter was repositioned to the left main pulmonary artery.  Hand-injection contrast was then performed to give an assessment of the left pulmonary vasculature and the effectiveness of thrombectomy.  Satisfied with the thrombectomy on the left, I then used the penumbra CAT 8 device as well as a Glidewire and an angled catheter to select the right main pulmonary artery  A select catheter was then advanced over the Amplatz wire into the distal right main pulmonary artery and hand-injection confirmed the thrombus.  5 mg of tPA was then injected directly into the thrombus within the right distal main pulmonary artery.  After an appropriate dwell time the Amplatz wire was reintroduced through the select catheter and the select catheter removed.  The Penumbra Cat 8 extra torque catheter was then advanced into the thrombus in the right lower lobe pulmonary artery.  Hand-injection contrast was used to verify positioning and evaluate the distal anatomy.  Mechanical aspiration was performed using the CAT 8 catheter and a separator.  After multiple passes the catheter  was then repositioned into the right middle lobe pulmonary artery and again hand-injection contrast was performed to verify position and evaluate the distal anatomy.  Multiple passes were made using mechanical aspiration in association with a separator.  Lastly, using a combination of the separator catheter and Glidewire the CAT 8 device was negotiated into the right upper lobe pulmonary artery.  Hand-injection of contrast was used to verify positioning and evaluate the distal anatomy.  Multiple passes were then performed using the separator with the CAT 8 penumbra catheter.  Once there was free flow of blood from all 3 lobar arteries the catheter was repositioned to the right main pulmonary artery.  Hand-injection contrast was then used to give an assessment of the effectiveness of thrombectomy.  After review of the images the catheter and sheath were removed and pressure held. There were no immediate complications.    Findings:   Right heart imaging:  Right atrium and right ventricle and the pulmonary outflow tract appears normal  Right lung: The initial images of the right lung demonstrate thrombus within the distal right main pulmonary artery extending into the right upper, middle and lower lobar arteries.  There is thrombus extending into the segmental branches as well.  Following thrombectomy there appears to be near total resolution of the previously identified thrombus.  Left lung:  The initial images of the left lung demonstrate thrombus within the distal left main pulmonary artery extending into the left  upper and lower lobar arteries.  There is thrombus extending into the segmental branches as well.  Following thrombectomy there appears to be near total resolution of the previously identified thrombus.    Disposition: Patient was taken to the recovery room in stable condition having tolerated the procedure well.  Cordella Dayanira Giovannetti 01/24/2024,10:18 AM

## 2024-01-24 NOTE — Consult Note (Signed)
 PHARMACY - ANTICOAGULATION CONSULT NOTE  Pharmacy Consult for IV Heparin  Indication: pulmonary embolus  Allergies[1]  Patient Measurements: Height: 5' 7 (170.2 cm) Weight: 52.2 kg (115 lb 2 oz) IBW/kg (Calculated) : 66.1 HEPARIN  DW (KG): 52.2  Vital Signs: Temp: 97 F (36.1 C) (12/16 1015) Temp Source: Temporal (12/16 1015) BP: 163/87 (12/16 1701) Pulse Rate: 80 (12/16 1500)  Labs: Recent Labs    01/22/24 1105 01/23/24 0733 01/23/24 0846 01/23/24 1746 01/24/24 0144 01/24/24 1828  HGB 12.9* 14.3  --   --  12.5*  --   HCT 40.1 44.6  --   --  38.7*  --   PLT 201 230  --   --  197  --   APTT  --   --  31 51* 67*  --   LABPROT  --   --  15.7*  --   --   --   INR  --   --  1.2  --   --   --   HEPARINUNFRC  --   --  0.80*  --  0.46 0.35  CREATININE 1.10  --  0.70  --  0.79  --     Estimated Creatinine Clearance: 74.3 mL/min (by C-G formula based on SCr of 0.79 mg/dL).   Medical History: Past Medical History:  Diagnosis Date   Alcohol abuse    Cocaine abuse (HCC)    Diabetes mellitus without complication (HCC)    Diastolic dysfunction    a. 06/2022 Echo (in setting of acute PE):  EF 50-55%, no rwma, mod-sev LVH, grI DD, mod reduced RV fxn, triv MR w/ MVP of post leaflet, ? bicuspid AoV w/ triv AI and Ao sclerosis.   Hypertension    Pulmonary embolism (HCC)    a. 06/2022 CTA: Large volume PE affecting all lobar braches w/ R heart strain-->s/p mech thrombectomy.   Schizo-affective schizophrenia (HCC)    Stroke (HCC)    TIA (transient ischemic attack)    Tobacco abuse     Medications:  Medication reconciliation is pending. Appears patient was on apixaban  in the past but appears Rx expired 08/19/2022.  Assessment: 58 y/o M with medical history as above and including PE in 06/2022 no longer on blood thinners diagnosed with PE in the emergency department on 01/23/24. Pharmacy consulted to dose heparin .   Baseline aPTT, INR and heparin  level are pending. Baseline CBC  within normal limits  12/15 1746 aPTT 51, subtherapeutic 12/16 0144 aPTT 67  HL 0.46, therapeutic X 1  12/16 1828 HL 0.35, therapeutic x 2  Goal of Therapy:  Heparin  level 0.3-0.7 units/ml Monitor platelets by anticoagulation protocol: Yes   Plan: HL therapeutic x 2 Continue heparin  infusion at 1050 units/hr Check HL daily while therapeutic CBC daily while on heparin   Mose Blew, PharmD, BCPS Clinical Pharmacist 01/24/2024 7:04 PM      [1]  Allergies Allergen Reactions   Haldol  [Haloperidol  Lactate]

## 2024-01-24 NOTE — Progress Notes (Signed)
 PROGRESS NOTE    Henry Munoz  FMW:[id number] DOB: [birth year] DOA: 01/23/2024 PCP: [facility]  Chief Complaint  Patient presents with   [facility] Course:  Zylon Creamer is a 58 year old male with history of pulmonary embolism nonadherent to his Eliquis , hypertension, chronic heart failure with preserved EF, cocaine abuse, COPD, insulin -dependent diabetes, schizoaffective disorder, prior CVA, who presented to the ED with right shoulder pain and frequent falls.  In the ED he was found to be tachycardic, blood pressure 160/102.  CTA showed multifocal pulmonary embolism with signs of right heart strain.  He was started on heparin  drip and vascular surgery was consulted.  He underwent thrombectomy on 12/16  Subjective: Patient was seen after OR today.  He is very drowsy.  He is requesting something to eat.  Otherwise denies any pain at this time   Objective: Vitals:   01/24/24 1200 01/24/24 1300 01/24/24 1400 01/24/24 1500  BP: (!) 157/98 (!) 149/97 (!) 150/92 (!) 145/91  Pulse: 74 74 85 80  Resp: 19 16 20 20   Temp:      TempSrc:      SpO2: 92% 95% 93% 98%  Weight:      Height:        Intake/Output Summary (Last 24 hours) at 01/24/2024 1643 Last data filed at 01/24/2024 1300 Gross per 24 hour  Intake 1353.91 ml  Output 800 ml  Net 553.91 ml   Filed Weights   01/23/24 0651  Weight: 52.2 kg    Examination: General exam: Appears calm and comfortable, NAD  Respiratory system: No work of breathing, symmetric chest wall expansion Cardiovascular system: S1 & S2 heard, RRR.  Gastrointestinal system: Abdomen is nondistended, soft and nontender.  Neuro: drowsy but arousable  Assessment & Plan:  Principal Problem:   PE (pulmonary thromboembolism) (HCC)    Massive pulmonary embolism Recurrent pulmonary embolism - Has been nonadherent to his Eliquis  - Currently admitted to the stepdown unit - Status post thrombectomy 12/16 with vascular  surgery - Continue heparin  drip - Dopplers negative - Echocardiogram pending  COPD, not currently in exacerbation - Presently stable on room air - Continue with home meds  Hypertension Chronic heart failure with preserved EF - Clinically euvolemic at this time - Gradually resume home meds as blood pressure tolerates - Restart amlodipine  now  Insulin -dependent diabetes with hyperglycemia Noncompliant with insulin  regimen - Hemoglobin A1c 12.7% - Sliding scale insulin  for now  Polysubstance abuse - Still currently using cocaine - Have educated on cessation  History of CVA - Resume statin  Schizoaffective disorder - Continue Abilify   DVT prophylaxis: Heparin    Code Status: Full Code Disposition: Inpatient pending clinical resolution.  Currently in stepdown unit on heparin  drip  Consultants:  Treatment Team:  Consulting Physician: Jama Cordella MATSU, MD  Procedures:    Antimicrobials:  Anti-infectives (From admission, onward)    Start     Dose/Rate Route Frequency Ordered Stop   01/24/24 0748  ceFAZolin  (ANCEF ) IVPB 2g/100 mL premix        2 g 200 mL/hr over 30 Minutes Intravenous 30 min pre-op 01/24/24 0748 01/24/24 0936       Data Reviewed: I have personally reviewed following labs and imaging studies CBC: Recent Labs  Lab 01/22/24 1105 01/23/24 0733 01/24/24 0144  WBC 4.9 6.5 4.6  HGB 12.9* 14.3 12.5*  HCT 40.1 44.6 38.7*  MCV 94.6 93.9 93.3  PLT 201 230 197   Basic Metabolic Panel: Recent Labs  Lab 01/22/24 1105 01/23/24 0846 01/24/24 0144  NA 140 142 141  K 3.7 3.1* 3.0*  CL 103 111 105  CO2 27 22 26   GLUCOSE 325* 210* 125*  BUN 17 10 12   CREATININE 1.10 0.70 0.79  CALCIUM  9.3 7.9* 8.9   GFR: Estimated Creatinine Clearance: 74.3 mL/min (by C-G formula based on SCr of 0.79 mg/dL). Liver Function Tests: Recent Labs  Lab 01/22/24 1105 01/23/24 0846  AST 12* 13*  ALT 6 <5  ALKPHOS 137* 101  BILITOT 0.3 0.3  PROT 6.3* 5.2*   ALBUMIN 3.8 3.2*   CBG: Recent Labs  Lab 01/23/24 1938 01/23/24 2052 01/24/24 0753 01/24/24 0820 01/24/24 1113  GLUCAP 176* 165* 145* 146* 143*    Recent Results (from the past 240 hours)  Resp panel by RT-PCR (RSV, Flu A&B, Covid) Anterior Nasal Swab     Status: None   Collection Time: 01/23/24  7:35 AM   Specimen: Anterior Nasal Swab  Result Value Ref Range Status   SARS Coronavirus 2 by RT PCR NEGATIVE NEGATIVE Final    Comment: (NOTE) SARS-CoV-2 target nucleic acids are NOT DETECTED.  The SARS-CoV-2 RNA is generally detectable in upper respiratory specimens during the acute phase of infection. The lowest concentration of SARS-CoV-2 viral copies this assay can detect is 138 copies/mL. A negative result does not preclude SARS-Cov-2 infection and should not be used as the sole basis for treatment or other patient management decisions. A negative result may occur with  improper specimen collection/handling, submission of specimen other than nasopharyngeal swab, presence of viral mutation(s) within the areas targeted by this assay, and inadequate number of viral copies(<138 copies/mL). A negative result must be combined with clinical observations, patient history, and epidemiological information. The expected result is Negative.  Fact Sheet for Patients:  bloggercourse.com  Fact Sheet for Healthcare Providers:  seriousbroker.it  This test is no t yet approved or cleared by the [place]  FDA and  has been authorized for detection and/or diagnosis of SARS-CoV-2 by FDA under an Emergency Use Authorization (EUA). This EUA will remain  in effect (meaning this test can be used) for the duration of the COVID-19 declaration under Section 564(b)(1) of the Act, 21 U.S.C.section 360bbb-3(b)(1), unless the authorization is terminated  or revoked sooner.       Influenza A by PCR NEGATIVE NEGATIVE Final   Influenza B by PCR  NEGATIVE NEGATIVE Final    Comment: (NOTE) The Xpert Xpress SARS-CoV-2/FLU/RSV plus assay is intended as an aid in the diagnosis of influenza from Nasopharyngeal swab specimens and should not be used as a sole basis for treatment. Nasal washings and aspirates are unacceptable for Xpert Xpress SARS-CoV-2/FLU/RSV testing.  Fact Sheet for Patients: bloggercourse.com  Fact Sheet for Healthcare Providers: seriousbroker.it  This test is not yet approved or cleared by the [place]  FDA and has been authorized for detection and/or diagnosis of SARS-CoV-2 by FDA under an Emergency Use Authorization (EUA). This EUA will remain in effect (meaning this test can be used) for the duration of the COVID-19 declaration under Section 564(b)(1) of the Act, 21 U.S.C. section 360bbb-3(b)(1), unless the authorization is terminated or revoked.     Resp Syncytial Virus by PCR NEGATIVE NEGATIVE Final    Comment: (NOTE) Fact Sheet for Patients: bloggercourse.com  Fact Sheet for Healthcare Providers: seriousbroker.it  This test is not yet approved or cleared by the [place]  FDA and has been authorized for detection and/or diagnosis of SARS-CoV-2 by FDA under an Emergency  Use Authorization (EUA). This EUA will remain in effect (meaning this test can be used) for the duration of the COVID-19 declaration under Section 564(b)(1) of the Act, 21 U.S.C. section 360bbb-3(b)(1), unless the authorization is terminated or revoked.  Performed at [facility], [address] [place], [place] [address]   MRSA Next Gen by PCR, Nasal     Status: None   Collection Time: 01/23/24  4:37 PM   Specimen: Nasal Mucosa; Nasal Swab  Result Value Ref Range Status   MRSA by PCR Next Gen NOT DETECTED NOT DETECTED Final    Comment: (NOTE) The GeneXpert MRSA Assay (FDA approved for NASAL specimens only), is  one component of a comprehensive MRSA colonization surveillance program. It is not intended to diagnose MRSA infection nor to guide or monitor treatment for MRSA infections. Test performance is not FDA approved in patients less than 42 years old. Performed at [facility], [address], [place], [place] [address]      Radiology Studies: PERIPHERAL VASCULAR CATHETERIZATION Result Date: 01/24/2024 See surgical note for result.  CT Angio Chest PE W and/or Wo Contrast Result Date: 01/23/2024 CLINICAL DATA:  Status post fall with right shoulder pain. Known history of pulmonary embolism, recently discontinued anticoagulation. EXAM: CT ANGIOGRAPHY CHEST WITH CONTRAST TECHNIQUE: Multidetector CT imaging of the chest was performed using the standard protocol during bolus administration of intravenous contrast. Multiplanar CT image reconstructions and MIPs were obtained to evaluate the vascular anatomy. RADIATION DOSE REDUCTION: This exam was performed according to the departmental dose-optimization program which includes automated exposure control, adjustment of the mA and/or kV according to patient size and/or use of iterative reconstruction technique. CONTRAST:  75mL OMNIPAQUE  IOHEXOL  350 MG/ML SOLN COMPARISON:  CTA chest dated 06/29/2022 FINDINGS: Cardiovascular: The study is adequate for the evaluation of pulmonary embolism to the level of mid lobar arteries due to motion artifact and beam hardening. There are multifocal filling defects at the main pulmonary artery bifurcation extending into bilateral main pulmonary arteries. Mildly dilated main pulmonary artery measures 3.2 cm. RV: LV ratio greater than 1. no significant pericardial fluid/thickening. Coronary artery calcifications. Mediastinum/Nodes: Imaged thyroid  gland without nodules meeting criteria for imaging follow-up by size. Normal esophagus. No pathologically enlarged axillary, supraclavicular, mediastinal, or hilar lymph nodes.  Lungs/Pleura: The central airways are patent. No focal consolidation. No pneumothorax. No pleural effusion. Upper abdomen: Normal. Musculoskeletal: No acute or abnormal lytic or blastic osseous lesions. Multilevel degenerative changes of the thoracic spine. Review of the MIP images confirms the above findings. IMPRESSION: 1. Multifocal pulmonary emboli at the main pulmonary artery bifurcation extending into bilateral main pulmonary arteries. 2. RV: LV ratio greater than 1, suggestive of right heart strain. 3. Mildly dilated main pulmonary artery measures 3.2 cm, which can be seen in the setting of pulmonary arterial hypertension. 4. Coronary artery calcifications. Assessment for potential risk factor modification, dietary therapy or pharmacologic therapy may be warranted, if clinically indicated. Critical Value/emergent results were called by telephone at the time of interpretation on 01/23/2024 at 10:28 am to provider PHILLIP STAFFORD , who verbally acknowledged these results. Electronically Signed   By: Limin  Xu M.D.   On: 01/23/2024 10:29   US  Venous Img Lower Unilateral Left Result Date: 01/23/2024 EXAM: ULTRASOUND DUPLEX OF THE LEFT LOWER EXTREMITY VEINS TECHNIQUE: Duplex ultrasound using B-mode/gray scaled imaging and Doppler spectral analysis and color flow was obtained of the deep venous structures of the left lower extremity. COMPARISON: Bilateral lower extremity Doppler ultrasound 05/25/last year. CLINICAL  HISTORY: 58 year old male with left lower extremity edema and pain. FINDINGS: The common femoral vein, femoral vein, popliteal vein, and visible calf veins demonstrate normal compressibility with normal color flow and spectral analysis. Subcutaneous edema visible at the calf on image 23. Contralateral visible right common femoral vein is patent. IMPRESSION: 1. No evidence of left lower extremity DVT. 2. Subcutaneous edema in the left calf. Electronically signed by: Helayne Hurst MD 01/23/2024 08:31 AM  EST RP Workstation: [id number]   DG Tibia/Fibula Left Result Date: 01/23/2024 EXAM: 3 VIEW(S) XRAY OF THE LEFT TIBIA AND FIBULA 01/23/2024 07:40:20 AM COMPARISON: None available. CLINICAL HISTORY: LLE edema, pain FINDINGS: BONES AND JOINTS: No acute fracture. No malalignment. SOFT TISSUES: Diffuse soft tissue swelling. Vascular calcifications. IMPRESSION: 1. Diffuse soft tissue swelling of the left lower extremity. 2. No acute osseous findings. 3. Vascular calcifications within the left lower extremity arteries. Electronically signed by: Waddell Calk MD 01/23/2024 07:46 AM EST RP Workstation: [id number]    Scheduled Meds:  ARIPiprazole   5 mg Oral Daily   Chlorhexidine  Gluconate Cloth  6 each Topical Daily   feeding supplement (GLUCERNA SHAKE)  237 mL Oral TID BM   [START ON 01/25/2024] folic acid   1 mg Oral Daily   gabapentin   300 mg Oral TID   insulin  aspart  0-15 Units Subcutaneous TID WC   insulin  aspart  0-5 Units Subcutaneous QHS   [START ON 01/25/2024] multivitamin with minerals  1 tablet Oral Daily   [START ON 01/25/2024] thiamine   100 mg Oral Daily   Continuous Infusions:  heparin  1,050 Units/hr (01/24/24 1144)     LOS: 1 day  MDM: Patient is high risk for one or more organ failure.  They necessitate ongoing hospitalization for continued IV therapies and subsequent lab monitoring. Total time spent interpreting labs and vitals, reviewing the medical record, coordinating care amongst consultants and care team members, directly assessing and discussing care with the patient and/or family: 55 min  Hillery Zachman, DO [facility]  To contact the attending physician between 7A-7P please use Epic Chat. To contact the covering physician during after hours 7P-7A, please review Amion.  01/24/2024, 4:43 PM   *This document has been created with the assistance of dictation software. Please excuse typographical errors. *

## 2024-01-24 NOTE — Plan of Care (Signed)
°  Problem: Coping: Goal: Ability to adjust to condition or change in health will improve Outcome: Progressing   Problem: Fluid Volume: Goal: Ability to maintain a balanced intake and output will improve Outcome: Progressing   Problem: Nutritional: Goal: Maintenance of adequate nutrition will improve Outcome: Progressing Goal: Progress toward achieving an optimal weight will improve Outcome: Progressing   Problem: Skin Integrity: Goal: Risk for impaired skin integrity will decrease Outcome: Progressing   Problem: Tissue Perfusion: Goal: Adequacy of tissue perfusion will improve Outcome: Progressing   Problem: Nutrition: Goal: Adequate nutrition will be maintained Outcome: Progressing   Problem: Coping: Goal: Level of anxiety will decrease Outcome: Progressing

## 2024-01-24 NOTE — Consult Note (Signed)
 PHARMACY - ANTICOAGULATION CONSULT NOTE  Pharmacy Consult for IV Heparin  Indication: pulmonary embolus  Allergies[1]  Patient Measurements: Height: 5' 7 (170.2 cm) Weight: 52.2 kg (115 lb 2 oz) IBW/kg (Calculated) : 66.1 HEPARIN  DW (KG): 52.2  Vital Signs: Temp: 97.9 F (36.6 C) (12/15 2100) Temp Source: Oral (12/15 2100) BP: 133/95 (12/16 0100) Pulse Rate: 85 (12/16 0100)  Labs: Recent Labs    01/22/24 1105 01/23/24 0733 01/23/24 0846 01/23/24 1746 01/24/24 0144  HGB 12.9* 14.3  --   --  12.5*  HCT 40.1 44.6  --   --  38.7*  PLT 201 230  --   --  197  APTT  --   --  31 51* 67*  LABPROT  --   --  15.7*  --   --   INR  --   --  1.2  --   --   HEPARINUNFRC  --   --  0.80*  --  0.46  CREATININE 1.10  --  0.70  --   --     Estimated Creatinine Clearance: 74.3 mL/min (by C-G formula based on SCr of 0.7 mg/dL).   Medical History: Past Medical History:  Diagnosis Date   Alcohol abuse    Cocaine abuse (HCC)    Diabetes mellitus without complication (HCC)    Diastolic dysfunction    a. 06/2022 Echo (in setting of acute PE):  EF 50-55%, no rwma, mod-sev LVH, grI DD, mod reduced RV fxn, triv MR w/ MVP of post leaflet, ? bicuspid AoV w/ triv AI and Ao sclerosis.   Hypertension    Pulmonary embolism (HCC)    a. 06/2022 CTA: Large volume PE affecting all lobar braches w/ R heart strain-->s/p mech thrombectomy.   Schizo-affective schizophrenia (HCC)    Stroke (HCC)    TIA (transient ischemic attack)    Tobacco abuse     Medications:  Medication reconciliation is pending. Appears patient was on apixaban  in the past but appears Rx expired 08/19/2022.  Assessment: 58 y/o M with medical history as above and including PE in 06/2022 no longer on blood thinners diagnosed with PE in the emergency department on 01/23/24. Pharmacy consulted to dose heparin .   Baseline aPTT, INR and heparin  level are pending. Baseline CBC within normal limits  12/15 1746 aPTT 51,  subtherapeutic 12/16 0144 aPTT 67  HL 0.46, therapeutic X 1   Goal of Therapy:  Heparin  level 0.3-0.7 units/ml Monitor platelets by anticoagulation protocol: Yes   Plan:  12/16 @ 0144:   aPTT = 67,  HL = 0.46 - aPTT therapeutic X 1, now correlating with HL - will use HL to guide dosing from here on - continue pt on current rate and recheck HL in 6 hrs  --Daily CBC per protocol while on IV heparin   Camella Seim D, PharmD 01/24/2024,4:21 AM       [1]  Allergies Allergen Reactions   Haldol  [Haloperidol  Lactate]

## 2024-01-24 NOTE — Interval H&P Note (Signed)
 History and Physical Interval Note:  01/24/2024 9:10 AM  Henry Munoz  has presented today for surgery, with the diagnosis of Pulmonary embolism..  The various methods of treatment have been discussed with the patient and family. After consideration of risks, benefits and other options for treatment, the patient has consented to  Procedures: PULMONARY THROMBECTOMY (Bilateral) as a surgical intervention.  The patient's history has been reviewed, patient examined, no change in status, stable for surgery.  I have reviewed the patient's chart and labs.  Questions were answered to the patient's satisfaction.     Cordella Shawl

## 2024-01-24 NOTE — Consult Note (Addendum)
 PHARMACY - ANTICOAGULATION CONSULT NOTE  Pharmacy Consult for IV Heparin  Indication: pulmonary embolus  Allergies[1]  Patient Measurements: Height: 5' 7 (170.2 cm) Weight: 52.2 kg (115 lb 2 oz) IBW/kg (Calculated) : 66.1 HEPARIN  DW (KG): 52.2  Vital Signs: Temp: 97.1 F (36.2 C) (12/16 0812) Temp Source: Temporal (12/16 0812) BP: 169/98 (12/16 1045) Pulse Rate: 72 (12/16 1045)  Labs: Recent Labs    01/22/24 1105 01/23/24 0733 01/23/24 0846 01/23/24 1746 01/24/24 0144  HGB 12.9* 14.3  --   --  12.5*  HCT 40.1 44.6  --   --  38.7*  PLT 201 230  --   --  197  APTT  --   --  31 51* 67*  LABPROT  --   --  15.7*  --   --   INR  --   --  1.2  --   --   HEPARINUNFRC  --   --  0.80*  --  0.46  CREATININE 1.10  --  0.70  --  0.79    Estimated Creatinine Clearance: 74.3 mL/min (by C-G formula based on SCr of 0.79 mg/dL).   Medical History: Past Medical History:  Diagnosis Date   Alcohol abuse    Cocaine abuse (HCC)    Diabetes mellitus without complication (HCC)    Diastolic dysfunction    a. 06/2022 Echo (in setting of acute PE):  EF 50-55%, no rwma, mod-sev LVH, grI DD, mod reduced RV fxn, triv MR w/ MVP of post leaflet, ? bicuspid AoV w/ triv AI and Ao sclerosis.   Hypertension    Pulmonary embolism (HCC)    a. 06/2022 CTA: Large volume PE affecting all lobar braches w/ R heart strain-->s/p mech thrombectomy.   Schizo-affective schizophrenia (HCC)    Stroke (HCC)    TIA (transient ischemic attack)    Tobacco abuse     Medications:  Medication reconciliation is pending. Appears patient was on apixaban  in the past but appears Rx expired 08/19/2022.  Assessment: 58 y/o M with medical history as above and including PE in 06/2022 no longer on blood thinners diagnosed with PE in the emergency department on 01/23/24. Pharmacy consulted to dose heparin .   Baseline aPTT, INR and heparin  level are pending. Baseline CBC within normal limits  12/15 1746 aPTT 51,  subtherapeutic 12/16 0144 aPTT 67  HL 0.46, therapeutic X 1   Goal of Therapy:  Heparin  level 0.3-0.7 units/ml Monitor platelets by anticoagulation protocol: Yes   Plan: Patient is now s/p thrombectomy - Resume heparin  at 1050 un/hr without any bolus - Check HL 6 hrs after restarting heparin  drip - Daily CBC per protocol while on IV heparin   Will M. Lenon, PharmD, BCPS Clinical Pharmacist 01/24/2024 10:57 AM     [1]  Allergies Allergen Reactions   Haldol  [Haloperidol  Lactate]

## 2024-01-24 NOTE — Inpatient Diabetes Management (Addendum)
 Inpatient Diabetes Program Recommendations  AACE/ADA: New Consensus Statement on Inpatient Glycemic Control  Target Ranges:  Prepandial:   less than 140 mg/dL      Peak postprandial:   less than 180 mg/dL (1-2 hours)      Critically ill patients:  140 - 180 mg/dL    Latest Reference Range & Units 01/23/24 11:48 01/23/24 16:30 01/23/24 19:38 01/23/24 20:52 01/24/24 07:53 01/24/24 08:20  Glucose-Capillary 70 - 99 mg/dL 809 (H) 700 (H) 823 (H) 165 (H) 145 (H) 146 (H)    Latest Reference Range & Units 01/23/24 08:46 01/24/24 01:44  Glucose 70 - 99 mg/dL 789 (H) 874 (H)    Latest Reference Range & Units 06/29/22 04:46 02/22/23 04:00 01/23/24 11:45  Hemoglobin A1C 4.8 - 5.6 % 14.0 (H) 12.6 (H) 12.7 (H)   Review of Glycemic Control  Diabetes history: DM2 Outpatient Diabetes medications: Lantus  25 units daily (not taking), Novolog  0-6 units QID (not taking) Current orders for Inpatient glycemic control: Novolog  0-15 units TID with meals, Novolog  0-5 units QHS  Inpatient Diabetes Program Recommendations:    HbgA1C:  A1C 12.7% on 01/23/24 indicating an average glucose of 318 mg/dl over the past 2-3 months.   NOTE: Patient admitted with pulmonary thromboembolism with noted hx of cocaine abuse. Per chart, patient has chronically uncontrolled DM (A1C 12.7% on 01/23/24). Patient is currently in cath lab for pulmonary thrombectomy. Ordered Living Well with DM book. Will plan to talk with patient tomorrow regarding DM control and A1C.  Thanks,  Earnie Gainer, RN, MSN, CDCES Diabetes Coordinator Inpatient Diabetes Program [phone] (Team Pager from 8am to 5pm)

## 2024-01-25 ENCOUNTER — Other Ambulatory Visit: Payer: Self-pay

## 2024-01-25 ENCOUNTER — Emergency Department

## 2024-01-25 ENCOUNTER — Telehealth (HOSPITAL_COMMUNITY): Payer: Self-pay

## 2024-01-25 ENCOUNTER — Encounter: Payer: Self-pay | Admitting: Emergency Medicine

## 2024-01-25 ENCOUNTER — Emergency Department
Admission: EM | Admit: 2024-01-25 | Discharge: 2024-01-30 | Disposition: A | Attending: Emergency Medicine | Admitting: Emergency Medicine

## 2024-01-25 ENCOUNTER — Other Ambulatory Visit (HOSPITAL_COMMUNITY): Payer: Self-pay

## 2024-01-25 DIAGNOSIS — W19XXXA Unspecified fall, initial encounter: Secondary | ICD-10-CM | POA: Insufficient documentation

## 2024-01-25 DIAGNOSIS — I5030 Unspecified diastolic (congestive) heart failure: Secondary | ICD-10-CM | POA: Insufficient documentation

## 2024-01-25 DIAGNOSIS — Z9889 Other specified postprocedural states: Secondary | ICD-10-CM

## 2024-01-25 DIAGNOSIS — I110 Hypertensive heart disease with heart failure: Secondary | ICD-10-CM | POA: Insufficient documentation

## 2024-01-25 DIAGNOSIS — R296 Repeated falls: Secondary | ICD-10-CM | POA: Insufficient documentation

## 2024-01-25 DIAGNOSIS — Z7901 Long term (current) use of anticoagulants: Secondary | ICD-10-CM | POA: Insufficient documentation

## 2024-01-25 DIAGNOSIS — Z8673 Personal history of transient ischemic attack (TIA), and cerebral infarction without residual deficits: Secondary | ICD-10-CM | POA: Insufficient documentation

## 2024-01-25 DIAGNOSIS — S0990XA Unspecified injury of head, initial encounter: Secondary | ICD-10-CM | POA: Insufficient documentation

## 2024-01-25 DIAGNOSIS — M7989 Other specified soft tissue disorders: Secondary | ICD-10-CM | POA: Insufficient documentation

## 2024-01-25 DIAGNOSIS — E119 Type 2 diabetes mellitus without complications: Secondary | ICD-10-CM | POA: Insufficient documentation

## 2024-01-25 DIAGNOSIS — Y92009 Unspecified place in unspecified non-institutional (private) residence as the place of occurrence of the external cause: Secondary | ICD-10-CM | POA: Insufficient documentation

## 2024-01-25 DIAGNOSIS — R7989 Other specified abnormal findings of blood chemistry: Secondary | ICD-10-CM | POA: Insufficient documentation

## 2024-01-25 DIAGNOSIS — Z794 Long term (current) use of insulin: Secondary | ICD-10-CM | POA: Insufficient documentation

## 2024-01-25 DIAGNOSIS — I2699 Other pulmonary embolism without acute cor pulmonale: Secondary | ICD-10-CM | POA: Diagnosis not present

## 2024-01-25 DIAGNOSIS — R8281 Pyuria: Secondary | ICD-10-CM | POA: Insufficient documentation

## 2024-01-25 DIAGNOSIS — Z79899 Other long term (current) drug therapy: Secondary | ICD-10-CM | POA: Insufficient documentation

## 2024-01-25 LAB — ECHOCARDIOGRAM COMPLETE
AR max vel: 3.7 cm2
AV Area VTI: 3.69 cm2
AV Area mean vel: 3.41 cm2
AV Mean grad: 3 mmHg
AV Peak grad: 4.8 mmHg
Ao pk vel: 1.1 m/s
Area-P 1/2: 4.06 cm2
Calc EF: 61.4 %
Height: 67 in
MV VTI: 4.29 cm2
S' Lateral: 3 cm
Single Plane A2C EF: 63.6 %
Single Plane A4C EF: 56.4 %
Weight: 1841.97 [oz_av]

## 2024-01-25 LAB — MAGNESIUM: Magnesium: 1.9 mg/dL (ref 1.7–2.4)

## 2024-01-25 LAB — COMPREHENSIVE METABOLIC PANEL WITH GFR
ALT: 6 U/L (ref 0–44)
ALT: 8 U/L (ref 0–44)
AST: 11 U/L — ABNORMAL LOW (ref 15–41)
AST: 14 U/L — ABNORMAL LOW (ref 15–41)
Albumin: 3.1 g/dL — ABNORMAL LOW (ref 3.5–5.0)
Albumin: 3.9 g/dL (ref 3.5–5.0)
Alkaline Phosphatase: 112 U/L (ref 38–126)
Alkaline Phosphatase: 121 U/L (ref 38–126)
Anion gap: 10 (ref 5–15)
Anion gap: 9 (ref 5–15)
BUN: 14 mg/dL (ref 6–20)
BUN: 14 mg/dL (ref 6–20)
CO2: 24 mmol/L (ref 22–32)
CO2: 27 mmol/L (ref 22–32)
Calcium: 8.7 mg/dL — ABNORMAL LOW (ref 8.9–10.3)
Calcium: 9.8 mg/dL (ref 8.9–10.3)
Chloride: 104 mmol/L (ref 98–111)
Chloride: 105 mmol/L (ref 98–111)
Creatinine, Ser: 0.98 mg/dL (ref 0.61–1.24)
Creatinine, Ser: 0.94 mg/dL (ref 0.61–1.24)
GFR, Estimated: >60 mL/min (ref >60)
GFR, Estimated: >60 mL/min (ref >60)
Glucose, Bld: 250 mg/dL — ABNORMAL HIGH (ref 70–99)
Glucose, Bld: 210 mg/dL — ABNORMAL HIGH (ref 70–99)
Potassium: 3.3 mmol/L — ABNORMAL LOW (ref 3.5–5.1)
Potassium: 3.9 mmol/L (ref 3.5–5.1)
Sodium: 138 mmol/L (ref 135–145)
Sodium: 140 mmol/L (ref 135–145)
Total Bilirubin: 0.3 mg/dL (ref 0.0–1.2)
Total Bilirubin: 0.5 mg/dL (ref 0.0–1.2)
Total Protein: 5.3 g/dL — ABNORMAL LOW (ref 6.5–8.1)
Total Protein: 6.8 g/dL (ref 6.5–8.1)

## 2024-01-25 LAB — GLUCOSE, CAPILLARY
Glucose-Capillary: 247 mg/dL — ABNORMAL HIGH (ref 70–99)
Glucose-Capillary: 277 mg/dL — ABNORMAL HIGH (ref 70–99)

## 2024-01-25 LAB — CBC
HCT: 34 % — ABNORMAL LOW (ref 39.0–52.0)
HCT: 40.9 % (ref 39.0–52.0)
Hemoglobin: 11.4 g/dL — ABNORMAL LOW (ref 13.0–17.0)
Hemoglobin: 13.4 g/dL (ref 13.0–17.0)
MCH: 30.5 pg (ref 26.0–34.0)
MCH: 30.5 pg (ref 26.0–34.0)
MCHC: 33.5 g/dL (ref 30.0–36.0)
MCHC: 32.8 g/dL (ref 30.0–36.0)
MCV: 90.9 fL (ref 80.0–100.0)
MCV: 93.2 fL (ref 80.0–100.0)
Platelets: 182 K/uL (ref 150–400)
Platelets: 192 K/uL (ref 150–400)
RBC: 3.74 MIL/uL — ABNORMAL LOW (ref 4.22–5.81)
RBC: 4.39 MIL/uL (ref 4.22–5.81)
RDW: 12.4 % (ref 11.5–15.5)
RDW: 12.4 % (ref 11.5–15.5)
WBC: 5 K/uL (ref 4.0–10.5)
WBC: 6 K/uL (ref 4.0–10.5)
nRBC: 0 % (ref 0.0–0.2)
nRBC: 0 % (ref 0.0–0.2)

## 2024-01-25 LAB — PHOSPHORUS: Phosphorus: 3 mg/dL (ref 2.5–4.6)

## 2024-01-25 LAB — HEPARIN LEVEL (UNFRACTIONATED)
Heparin Unfractionated: 0.18 [IU]/mL — ABNORMAL LOW (ref 0.30–0.70)
Heparin Unfractionated: 0.35 [IU]/mL (ref 0.30–0.70)

## 2024-01-25 MED ORDER — APIXABAN 5 MG PO TABS
10.0000 mg | ORAL_TABLET | Freq: Two times a day (BID) | ORAL | Status: DC
  Administered 2024-01-25: 13:00:00 10 mg via ORAL
  Filled 2024-01-25: qty 2

## 2024-01-25 MED ORDER — POTASSIUM CHLORIDE CRYS ER 20 MEQ PO TBCR
40.0000 meq | EXTENDED_RELEASE_TABLET | Freq: Once | ORAL | Status: AC
  Administered 2024-01-25: 10:00:00 40 meq via ORAL
  Filled 2024-01-25: qty 2

## 2024-01-25 MED ORDER — APIXABAN 5 MG PO TABS: 5.0000 mg | ORAL_TABLET | Freq: Two times a day (BID) | ORAL | Status: DC

## 2024-01-25 MED ORDER — APIXABAN (ELIQUIS) VTE STARTER PACK (10MG AND 5MG): ORAL_TABLET | ORAL | 0 refills | Status: AC

## 2024-01-25 MED ORDER — BAQSIMI TWO PACK 3 MG/DOSE NA POWD: 3.0000 mg | NASAL | 0 refills | Status: AC | PRN

## 2024-01-25 MED ORDER — PEN NEEDLES 31G X 5 MM MISC: 1.0000 | 0 refills | Status: AC

## 2024-01-25 MED ORDER — INSULIN LISPRO (1 UNIT DIAL) 100 UNIT/ML (KWIKPEN): 0.0000 [IU] | PEN_INJECTOR | Freq: Three times a day (TID) | SUBCUTANEOUS | 0 refills | Status: AC

## 2024-01-25 MED ORDER — INSULIN GLARGINE 100 UNIT/ML SOLOSTAR PEN: 10.0000 [IU] | PEN_INJECTOR | Freq: Every day | SUBCUTANEOUS | 0 refills | Status: AC

## 2024-01-25 MED ORDER — BLOOD GLUCOSE MONITORING SUPPL DEVI: 1.0000 | 0 refills | Status: AC

## 2024-01-25 MED ORDER — BLOOD GLUCOSE TEST VI STRP: 1.0000 | ORAL_STRIP | 0 refills | Status: AC

## 2024-01-25 MED ORDER — HEPARIN BOLUS VIA INFUSION
1550.0000 [IU] | Freq: Once | INTRAVENOUS | Status: AC
  Administered 2024-01-25: 05:00:00 1550 [IU] via INTRAVENOUS
  Filled 2024-01-25: qty 1550

## 2024-01-25 MED ORDER — INSULIN GLARGINE 100 UNIT/ML ~~LOC~~ SOLN
10.0000 [IU] | Freq: Every day | SUBCUTANEOUS | Status: DC
  Administered 2024-01-25: 12:00:00 10 [IU] via SUBCUTANEOUS
  Filled 2024-01-25: qty 0.1

## 2024-01-25 NOTE — Progress Notes (Signed)
 Progress Note    01/25/2024 12:34 PM 1 Day Post-Op  Subjective:  Henry Munoz is a 58 yo male now POD #1 from:  Date of Surgery: 01/24/2024,10:18 AM   Surgeon:Schnier, Cordella MATSU    Pre-operative Diagnosis: Symptomatic pulmonary emboli with right heart strain and hypoxia   Post-operative diagnosis:  Same   Procedure(s) Performed:             1.  Contrast injection right heart and bilateral pulmonary arteries             2.  Thrombolysis bilateral pulmonary arteries with 10 mg of TPA             3.  Mechanical thrombectomy bilateral lobar pulmonary arteries for removal of pulmonary emboli using the Penumbra CAT 8 thrombectomy catheter.             4.  Selective catheter placement right upper lobe pulmonary artery, middle lobe pulmonary artery and lower lobe pulmonary artery             5.  Selective catheter placement left upper lobe pulmonary artery and lower lobe pulmonary artery  Patient is resting comfortably in bed this morning. He endorses he is feeling much better and breathing much better. Ambulating without difficulties. He requests to go home this afternoon. No complaints overnight and vitals all remain stable.   Vitals:   01/25/24 1100 01/25/24 1200  BP: (!) 156/87 (!) 156/90  Pulse: 83 82  Resp: 17 (!) 21  Temp:    SpO2: 95% 94%   Physical Exam: Cardiac:  RRR Normal S1, S2. No Murmurs.  Lungs:  Non Labored breathing, clear throughout on auscultation. No rales, rhonchi or wheezing.  Incisions:  Right Groin puncture site. Dressing clean dry and intact. No hematoma or seroma or infection to note.  Extremities:  All extremities are warm to the touch and positive palpable pulses.  Abdomen:  Morbid Obesity, positive bowel sounds throughout, soft and non tender  Neurologic: AAOX3, answers all questions and follows commands appropriately.   CBC    Component Value Date/Time   WBC 5.0 01/25/2024 0339   RBC 3.74 (L) 01/25/2024 0339   HGB 11.4 (L) 01/25/2024 0339    HCT 34.0 (L) 01/25/2024 0339   PLT 182 01/25/2024 0339   MCV 90.9 01/25/2024 0339   MCH 30.5 01/25/2024 0339   MCHC 33.5 01/25/2024 0339   RDW 12.4 01/25/2024 0339   LYMPHSABS 0.7 02/23/2023 0648   MONOABS 0.5 02/23/2023 0648   EOSABS 0.0 02/23/2023 0648   BASOSABS 0.0 02/23/2023 0648    BMET    Component Value Date/Time   NA 138 01/25/2024 0339   NA 145 (H) 10/20/2017 1921   K 3.3 (L) 01/25/2024 0339   CL 104 01/25/2024 0339   CO2 24 01/25/2024 0339   GLUCOSE 250 (H) 01/25/2024 0339   BUN 14 01/25/2024 0339   BUN 13 10/20/2017 1921   CREATININE 0.98 01/25/2024 0339   CALCIUM  8.7 (L) 01/25/2024 0339   GFRNONAA >60 01/25/2024 0339   GFRAA 114 10/20/2017 1921    INR    Component Value Date/Time   INR 1.2 01/23/2024 0846     Intake/Output Summary (Last 24 hours) at 01/25/2024 1234 Last data filed at 01/25/2024 0600 Gross per 24 hour  Intake 194.43 ml  Output 500 ml  Net -305.57 ml     Assessment/Plan:  58 y.o. male is s/p SEE ABOVE 1 Day Post-Op   PLAN Discontinue Heparin  infusion and start  Oral anticoagulation.  Start Eliquis  10 mg BID for 7 days then decrease dose to 5 mg BID indefinitely.  Once received a dose of oral eliquis  okay per vascular surgery to discharge home. Follow up with vein and Vascular Surgery as Scheduled.   DVT prophylaxis:  Heparin  Infusion converted to oral eliquis     Gwendlyn JONELLE Shank [facility] 01/25/2024 12:34 PM

## 2024-01-25 NOTE — Inpatient Diabetes Management (Addendum)
 Inpatient Diabetes Program Recommendations  AACE/ADA: New Consensus Statement on Inpatient Glycemic Control   Target Ranges:  Prepandial:   less than 140 mg/dL      Peak postprandial:   less than 180 mg/dL (1-2 hours)      Critically ill patients:  140 - 180 mg/dL    Latest Reference Range & Units 01/24/24 07:53 01/24/24 08:20 01/24/24 11:13 01/24/24 16:39 01/24/24 21:04 01/25/24 07:41  Glucose-Capillary 70 - 99 mg/dL 854 (H) 853 (H) 856 (H) 236 (H) 258 (H) 247 (H)    Latest Reference Range & Units 02/22/23 04:00 01/23/24 11:45  Hemoglobin A1C 4.8 - 5.6 % 12.6 (H) 12.7 (H)   Review of Glycemic Control  Diabetes history: DM2 Outpatient Diabetes medications: Lantus  25 units daily (not taking), Novolog  0-6 units QID (not taking) Current orders for Inpatient glycemic control: Novolog  0-15 units TID with meals, Novolog  0-5 units QHS   Inpatient Diabetes Program Recommendations:     Insulin : Please consider ordering Lantus  10 units daily to start now.  HbgA1C:  A1C 12.7% on 01/23/24 indicating an average glucose of 318 mg/dl over the past 2-3 months.  Outpatient DM: At time of discharge please provide Rx for Lantus  Solostar [phone]), insulin  aspart pens (#[id number]), insulin  pen needles (#[id number]), and glucose monitoring kit (#[id number]).  Addendum 01/25/24@11 :15-Spoke with patient at bedside regarding DM control. Patient states that he has not taken any DM medications in a while. Patient reports he was taking Lantus  22-40 units daily and he was not using the Novolog  insulin  at all. He states that the dose of Lantus  he took depended on glucose value. Patient reports he does not have any insulin  at home and he does not have a glucometer or testing supplies. Patient states that he plans to go home today. Patient reports he goes to [facility] but has not been in a long time. Discussed A1C results (12.7% on 01/23/24) and explained that current A1C indicates an average glucose of 318 mg/dl over the past  2-3 months. Discussed glucose and A1C goals. Discussed importance of checking CBGs and maintaining good CBG control to prevent long-term and short-term complications. Explained how hyperglycemia leads to damage within blood vessels which lead to the common complications seen with uncontrolled diabetes. Stressed to the patient the importance of improving glycemic control to prevent further complications from uncontrolled diabetes. Encouraged patient to take DM medications as prescribed, to call [facility] to get appointment for follow up ASAP, and that he start checking glucose 3-4 times a day. Informed patient that it would be requested that he get Rx for Lantus  pens, generic Novolog  pens, pen needles, and glucose monitoring kit.   Patient verbalized understanding of information discussed and reports no further questions at this time related to diabetes. Sent chat message to Dr. Leesa regarding Rx for Lantus , generic Novolog  pens, pen needles, and glucose monitoring kit; recommended to do meds to bed to ensure patient receives insulin  and testings supplies.  Thanks, Earnie Gainer, RN, MSN, CDCES Diabetes Coordinator Inpatient Diabetes Program [phone] (Team Pager from 8am to 5pm)

## 2024-01-25 NOTE — ED Triage Notes (Signed)
 Pt from home for fall, pt has seven falls since 12/12 that ems has been called out to his home for. Pt was last seen for fall a couple days ago. Pt had a procedure yesterday to remove blood clots per ems. Pt has dressing noted to right groin. Pt reports pain at site 8/10. Dressing dry, clean and intact. Pt denies cp but reports some sob.  Ems states this is pt's 2nd fall today, pt reports hitting head but denies LOC. Pt reports starting eliquis  today. Ems reports pt does not have power in his home and would probably benefit from child psychotherapist.

## 2024-01-25 NOTE — TOC Initial Note (Signed)
 Transition of Care [facility]) - Initial/Assessment Note    Patient Details  Name: Henry Munoz MRN: [id number] Date of Birth: 09/03/1965  Transition [facility] Care [facility]) CM/SW Contact:    Corrie JINNY Ruts, LCSW Phone Number: 01/25/2024, 1:22 PM  Clinical Narrative:                 Chart reviewed. I spoke with the patient at bedside today. The patient reports that he has a PCP. The patient reports that he lives with his brother. The patient reports that he has a AID that comes into the home and assist him with task. The patient reports that the agency is called [employer]. The patient reports that he does not remember the last time he saw the agency. The patient reports that he has never been admitted into a SNF In the past. The patient reports that [facility] uses [facility]. The patient reports that he does not know who will assist him at D/C.   The patient reports that he has a cane and shower seat in the home.         Patient Goals and CMS Choice            Expected Discharge Plan and Services         Expected Discharge Date: 01/25/24                                    Prior Living Arrangements/Services                       Activities of Daily Living      Permission Sought/Granted                  Emotional Assessment              Admission diagnosis:  Morbid obesity (HCC) [E66.01] PE (pulmonary thromboembolism) (HCC) [I26.99] Bilateral pulmonary embolism (HCC) [I26.99] Type 2 diabetes mellitus without complication, with long-term current use of insulin  (HCC) [E11.9, Z79.4] Patient Active Problem List   Diagnosis Date Noted   Acute hypoxic respiratory failure (HCC) 02/22/2023   Polysubstance abuse (HCC) 02/22/2023   (HFpEF) heart failure with preserved ejection fraction (HCC) 02/22/2023   COPD exacerbation (HCC) 02/21/2023   Acute respiratory failure with hypoxia (HCC) 02/21/2023   Dyslipidemia 02/21/2023   Diabetic neuropathy (HCC) 02/21/2023    History of pulmonary embolism 02/21/2023   History of CVA (cerebrovascular accident) 02/21/2023   Auditory hallucinations 07/11/2022   Suicidal ideation 07/11/2022   Cocaine use 07/04/2022   Schizophrenia (HCC) 07/03/2022   PE (pulmonary thromboembolism) (HCC) 07/02/2022   Positive blood culture 06/30/2022   Cocaine abuse (HCC) 06/30/2022   Uncontrolled type 2 diabetes mellitus with hyperglycemia, with long-term current use of insulin  (HCC) 06/30/2022   Pseudohyponatremia 06/30/2022   AKI (acute kidney injury) 06/30/2022   Hyperkalemia 06/30/2022   IV infiltration, initial encounter 06/30/2022   Pulmonary embolus (HCC) 06/29/2022   Neuropathy, arm, left 10/15/2017   Acute encephalopathy 10/10/2017   Opiate abuse, episodic (HCC) 10/10/2017   MDD (major depressive disorder) 08/12/2016   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 [place] [address] Phone: [phone] Fax: [phone]  [facility] [address] [place] - [address] [place] [address] Phone: [phone] Fax: [phone]     Social Drivers of Health (SDOH) Social History: SDOH Screenings   Food Insecurity: No Food Insecurity (01/24/2024)  Housing: Low Risk (01/24/2024)  Transportation Needs: No Transportation Needs (01/24/2024)  Utilities: Not At Risk (01/24/2024)  Alcohol Screen: Low Risk (07/12/2022)  Tobacco Use: High Risk  (02/22/2023)   SDOH Interventions:     Readmission Risk Interventions    01/25/2024    1:22 PM 02/23/2023    3:36 PM  Readmission Risk Prevention Plan  Transportation Screening Complete Complete  Medication Review (RN Care Manager) Complete Complete  PCP or Specialist appointment within 3-5 days of discharge Complete Complete  HRI or Home Care Consult Complete   SW Recovery Care/Counseling Consult Complete Complete  Palliative Care Screening Not Applicable Not Applicable  Skilled Nursing Facility Not Applicable Not Applicable

## 2024-01-25 NOTE — Progress Notes (Signed)
 Nutrition Follow Up Note   DOCUMENTATION CODES:   Underweight  INTERVENTION:   Nepro Shake po TID, each supplement provides 425 kcal and 19 grams protein  MVI, folic acid  and thiamine  po daily   Pt remains at high refeed risk; recommend monitor potassium, magnesium  and phosphorus labs daily until stable  Daily weights   NUTRITION DIAGNOSIS:   Increased nutrient needs related to catabolic illness as evidenced by estimated needs. -ongoing   GOAL:   Patient will meet greater than or equal to 90% of their needs -progressing   MONITOR:   PO intake, Supplement acceptance, Labs, Weight trends, I & O's, Skin  ASSESSMENT:   58 y/o male with a h/o HTN, substance abuse, CVA/TIA, MDD, DM, neuropathy, schizophrenia, COPD, HLD, CHF and frequent falls who is admitted with massive PE now s/p thrombectomy 12/16.  Met with pt in room today. Pt is anxious to go home. Pt reports good appetite and oral intake; pt ate 100% of his breakfast this morning. Pt does not like the Glucerna and prefers the mixed berry Nepro. RD discussed with pt the importance of adequate nutrition needed to preserve lean muscle. Pt denies any recent weight loss. Admit weight incorrect; pt weighed this morning and appears to be at his UBW currently. Pt to discharge today.   Medications reviewed and include: folic acid , MVI, thiamine , insulin   Labs reviewed: K 3.3(L), P 3.0 wnl, Mg 1.9 wnl Cbgs- 277, 247 x 24 hrs   UOP-   NUTRITION - FOCUSED PHYSICAL EXAM:  Flowsheet Row Most Recent Value  Orbital Region No depletion  Upper Arm Region No depletion  Thoracic and Lumbar Region No depletion  Buccal Region No depletion  Temple Region No depletion  Clavicle Bone Region No depletion  Clavicle and Acromion Bone Region No depletion  Scapular Bone Region No depletion  Dorsal Hand No depletion  Patellar Region Unable to assess  Anterior Thigh Region Unable to assess  Posterior Calf Region Unable to assess   Edema (RD Assessment) Moderate  Hair Reviewed  Eyes Reviewed  Mouth Reviewed  Skin Reviewed  Nails Reviewed   Diet Order:   Diet Order             Diet general           Diet regular Room service appropriate? Yes; Fluid consistency: Thin  Diet effective now                  EDUCATION NEEDS:   No education needs have been identified at this time  Skin:  Skin Assessment: Reviewed RN Assessment  Last BM:  pta  Height:   Ht Readings from Last 1 Encounters:  01/23/24 5' 7 (1.702 m)    Weight:   Wt Readings from Last 1 Encounters:  01/25/24 115 kg    Ideal Body Weight:  67.2 kg  BMI:  Body mass index is 39.71 kg/m.  Estimated Nutritional Needs:   Kcal:  1800-2100kcal/day  Protein:  90-105g/day  Fluid:  1.6-1.8L/day  Augustin Shams MS, RD, LDN If unable to be reached, please send secure chat to RD inpatient available from 8:00a-4:00p daily

## 2024-01-25 NOTE — ED Notes (Addendum)
 Pt noted lying on floor in lobby; alert & oriented; st that he was trying to put his shoe on and fell forward; denies any injuries or c/o and none noted; pt able to stand with assistance x 2 and sat back into w/c; taken to triage for reassessment; charge nurse VEAR Perry RN and Dr Waymond notified; pt assisted onto stretcher for CT imaging

## 2024-01-25 NOTE — Telephone Encounter (Signed)
 Pharmacy Patient Advocate Encounter  Insurance verification completed.    The patient is insured through [facility] MEDICAID.     Ran test claim for Lantus  100unit Pen and the current 30 day co-pay is $4.  Ran test claim for Generic Novolog  100unit Pen and the current 30 day co-pay is $4.  This test claim was processed through [employer]- copay amounts may vary at other pharmacies due to [employer], or as the patient moves through the different stages of their insurance plan.

## 2024-01-25 NOTE — Consult Note (Signed)
 PHARMACY - ANTICOAGULATION CONSULT NOTE  Pharmacy Consult for IV Heparin  Indication: pulmonary embolus  Allergies[1]  Patient Measurements: Height: 5' 7 (170.2 cm) Weight: 52.2 kg (115 lb 2 oz) IBW/kg (Calculated) : 66.1 HEPARIN  DW (KG): 52.2  Vital Signs: Temp: 98.6 F (37 C) (12/17 0000) Temp Source: Axillary (12/17 0000) BP: 164/95 (12/17 0300) Pulse Rate: 99 (12/17 0300)  Labs: Recent Labs    01/22/24 1105 01/23/24 0733 01/23/24 0846 01/23/24 1746 01/24/24 0144 01/24/24 1828 01/25/24 0339  HGB  --  14.3  --   --  12.5*  --  11.4*  HCT  --  44.6  --   --  38.7*  --  34.0*  PLT  --  230  --   --  197  --  182  APTT  --   --  31 51* 67*  --   --   LABPROT  --   --  15.7*  --   --   --   --   INR  --   --  1.2  --   --   --   --   HEPARINUNFRC   < >  --  0.80*  --  0.46 0.35 0.18*  CREATININE  --   --  0.70  --  0.79  --  0.98   < > = values in this interval not displayed.    Estimated Creatinine Clearance: 60.7 mL/min (by C-G formula based on SCr of 0.98 mg/dL).   Medical History: Past Medical History:  Diagnosis Date   Alcohol abuse    Cocaine abuse (HCC)    Diabetes mellitus without complication (HCC)    Diastolic dysfunction    a. 06/2022 Echo (in setting of acute PE):  EF 50-55%, no rwma, mod-sev LVH, grI DD, mod reduced RV fxn, triv MR w/ MVP of post leaflet, ? bicuspid AoV w/ triv AI and Ao sclerosis.   Hypertension    Pulmonary embolism (HCC)    a. 06/2022 CTA: Large volume PE affecting all lobar braches w/ R heart strain-->s/p mech thrombectomy.   Schizo-affective schizophrenia (HCC)    Stroke (HCC)    TIA (transient ischemic attack)    Tobacco abuse     Medications:  Medication reconciliation is pending. Appears patient was on apixaban  in the past but appears Rx expired 08/19/2022.  Assessment: 58 y/o M with medical history as above and including PE in 06/2022 no longer on blood thinners diagnosed with PE in the emergency department on 01/23/24.  Pharmacy consulted to dose heparin .   Baseline aPTT, INR and heparin  level are pending. Baseline CBC within normal limits  12/15 1746 aPTT 51, subtherapeutic 12/16 0144 aPTT 67  HL 0.46, therapeutic X 1  12/16 1828 HL 0.35, therapeutic x 2 12/17 0339 HL 0.18, SUBtherapeutic   Goal of Therapy:  Heparin  level 0.3-0.7 units/ml Monitor platelets by anticoagulation protocol: Yes   Plan: 12/17:  HL @ 0339 = 0.18, SUBtherapeutic - Will order heparin  1550 units IV X 1 and increase drip rate to 1250 units/hr - Recheck HL 6 hrs after rate change  CBC daily while on heparin   Henry Munoz D Clinical Pharmacist 01/25/2024 4:24 AM       [1]  Allergies Allergen Reactions   Haldol  [Haloperidol  Lactate]

## 2024-01-25 NOTE — Telephone Encounter (Signed)
 Pharmacy Patient Advocate Encounter  Insurance verification completed.    The patient is insured through [employer] MEDICAID.     Ran test claim for Eliquis  5mg  tablet and the current 30 day co-pay is $4.   This test claim was processed through [employer]- copay amounts may vary at other pharmacies due to [employer], or as the patient moves through the different stages of their insurance plan.

## 2024-01-25 NOTE — ED Notes (Addendum)
 Pt incontinent of urine; pt clensed, linen and hosp gown changed; pt able to move self side-to-side in bed; large amount swelling noted to left LE and skin cool to touch; PP located with doppler; pt reports swelling is at baseline and denies any c/o pain; discussed findings with Dr Neomi; will bring pt to exam room for further evaluation

## 2024-01-25 NOTE — ED Notes (Signed)
 Pt had witnessed fall in waiting area, denies hitting head. Denies injury. VSS  115/67(81) 86 HR 96% RA

## 2024-01-25 NOTE — TOC Progression Note (Signed)
 Transition of Care [facility]) - Progression Note    Patient Details  Name: Henry Munoz MRN: [id number] Date of Birth: Jan 11, 1966  [facility]) CM/SW Contact  K'La JINNY Ruts, LCSW Phone Number: 01/25/2024, 1:20 PM  Clinical Narrative:    Chart reviewed. Received a secure chat from the nurse. The patient reports that he is in need of a rolator. I have called [employer] to get the patient a rolator delivered to his home.                      Expected Discharge Plan and Services         Expected Discharge Date: 01/25/24                                     Social Drivers of Health (SDOH) Interventions SDOH Screenings   Food Insecurity: No Food Insecurity (01/24/2024)  Housing: Low Risk (01/24/2024)  Transportation Needs: No Transportation Needs (01/24/2024)  Utilities: Not At Risk (01/24/2024)  Alcohol Screen: Low Risk (07/12/2022)  Tobacco Use: High Risk (02/22/2023)    Readmission Risk Interventions    02/23/2023    3:36 PM  Readmission Risk Prevention Plan  Transportation Screening Complete  Medication Review (RN Care Manager) Complete  PCP or Specialist appointment within 3-5 days of discharge Complete  SW Recovery Care/Counseling Consult Complete  Palliative Care Screening Not Applicable  Skilled Nursing Facility Not Applicable

## 2024-01-25 NOTE — Consult Note (Signed)
 PHARMACY - ANTICOAGULATION CONSULT NOTE  Pharmacy Consult for IV Heparin  Indication: pulmonary embolus  Allergies[1]  Patient Measurements: Height: 5' 7 (170.2 cm) Weight: 115 kg (253 lb 8.5 oz) IBW/kg (Calculated) : 66.1 HEPARIN  DW (KG): 52.2  Vital Signs: Temp: 98.6 F (37 C) (12/17 0000) Temp Source: Axillary (12/17 0000) BP: 152/88 (12/17 0600) Pulse Rate: 89 (12/17 0600)  Labs: Recent Labs    01/22/24 1105 01/23/24 0733 01/23/24 0846 01/23/24 1746 01/24/24 0144 01/24/24 1828 01/25/24 0339  HGB  --  14.3  --   --  12.5*  --  11.4*  HCT  --  44.6  --   --  38.7*  --  34.0*  PLT  --  230  --   --  197  --  182  APTT  --   --  31 51* 67*  --   --   LABPROT  --   --  15.7*  --   --   --   --   INR  --   --  1.2  --   --   --   --   HEPARINUNFRC   < >  --  0.80*  --  0.46 0.35 0.18*  CREATININE  --   --  0.70  --  0.79  --  0.98   < > = values in this interval not displayed.    Estimated Creatinine Clearance: 99.6 mL/min (by C-G formula based on SCr of 0.98 mg/dL).   Medical History: Past Medical History:  Diagnosis Date   Alcohol abuse    Cocaine abuse (HCC)    Diabetes mellitus without complication (HCC)    Diastolic dysfunction    a. 06/2022 Echo (in setting of acute PE):  EF 50-55%, no rwma, mod-sev LVH, grI DD, mod reduced RV fxn, triv MR w/ MVP of post leaflet, ? bicuspid AoV w/ triv AI and Ao sclerosis.   Hypertension    Pulmonary embolism (HCC)    a. 06/2022 CTA: Large volume PE affecting all lobar braches w/ R heart strain-->s/p mech thrombectomy.   Schizo-affective schizophrenia (HCC)    Stroke (HCC)    TIA (transient ischemic attack)    Tobacco abuse     Medications:  Medication reconciliation is pending. Appears patient was on apixaban  in the past but appears Rx expired 08/19/2022.  Assessment: 58 y/o M with medical history as above and including PE in 06/2022 no longer on blood thinners diagnosed with PE in the emergency department on  01/23/24. Pharmacy consulted to dose heparin .   Goal of Therapy:  Heparin  level 0.3-0.7 units/ml Monitor platelets by anticoagulation protocol: Yes   Plan:  heparin  level therapeutic x 1 - continue heparin  infusion at 1250 units/hr - Recheck heparin  level in 6 hrs to confirm - CBC daily while on heparin   Adriana JONETTA Bolster Clinical Pharmacist 01/25/2024 7:25 AM        [1]  Allergies Allergen Reactions   Haldol  [Haloperidol  Lactate]

## 2024-01-25 NOTE — Discharge Summary (Signed)
 DISCHARGE SUMMARY    Henry Munoz FMW:[id number] DOB: 1965-11-18 DOA: 01/23/2024  PCP: [facility]  Admit date: 01/23/2024 Discharge date: 01/25/2024   Recommendations for Outpatient Follow-up:  Follow up with PCP in 1-2 weeks to review chronic condition management Follow up with vascular surgery as scheduled   Hospital Course: [name] is a 58 year old male with history of pulmonary embolism nonadherent to his Eliquis , hypertension, chronic heart failure with preserved EF, cocaine abuse, COPD, insulin -dependent diabetes, schizoaffective disorder, prior CVA, who presented to the ED with right shoulder pain and frequent falls.  In the ED he was found to be tachycardic, blood pressure 160/102.  CTA showed multifocal pulmonary embolism with signs of right heart strain.  He was started on heparin  drip and vascular surgery was consulted.  He underwent thrombectomy on 12/16. By reevaluation on 12/17 patient was back to his baseline and requesting for discharge home. We extensively discussed the importance of staying on anticoagulation.  We also discussed the importance of smoking cessation and discontinuing cocaine abuse.  Patient endorses understanding but has little interest in cessation of the substances. Meds to beds delivered diabetes medications and Eliquis  prior to discharge.  A walker was ordered per patient request.  Massive pulmonary embolism Recurrent pulmonary embolism - Has been nonadherent to his Eliquis  - Status post thrombectomy 12/16 with vascular surgery -Initially on heparin  drip.  Have discontinued this in favor of Eliquis .  Eliquis  to be delivered to bedside few months prior to DC - Dopplers negative - Echocardiogram finished, read still pending.  Completed, read still pending   COPD, not currently in exacerbation - Presently stable on room air - Continue with home meds   Hypertension Chronic heart failure with preserved EF -  Clinically euvolemic at this time -Resume home meds   Insulin -dependent diabetes with hyperglycemia Noncompliant with insulin  regimen - Hemoglobin A1c 12.7% -Lantus  and aspart sent to pharmacy prior to DC.  Diabetes educator has worked with patient  Polysubstance abuse - Still currently using cocaine - Have educated on cessation  Tobacco abuse - Patient denies consideration of discontinuation   History of CVA - Resume statin   Schizoaffective disorder - Continue Abilify    Discharge Instructions  Discharge Instructions     Call MD for:  difficulty breathing, headache or visual disturbances   Complete by: As directed    Call MD for:  persistant dizziness or light-headedness   Complete by: As directed    Call MD for:  persistant nausea and vomiting   Complete by: As directed    Call MD for:  severe uncontrolled pain   Complete by: As directed    Call MD for:  temperature >100.4   Complete by: As directed    Diet general   Complete by: As directed    Discharge instructions   Complete by: As directed    Follow up with your primary care doctor within 1 month and vascular surgeon as scheduled.   Increase activity slowly   Complete by: As directed       Allergies as of 01/25/2024       Reactions   Haldol  [haloperidol  Lactate]         Medication List     STOP taking these medications    apixaban  5 MG Tabs tablet Commonly known as: ELIQUIS  Replaced by: Apixaban  Starter Pack (10mg  and 5mg )   insulin  aspart 100 UNIT/ML FlexPen Commonly known as: NOVOLOG    ketoconazole 2 % cream Commonly known as: NIZORAL  TAKE these medications    albuterol  108 (90 Base) MCG/ACT inhaler Commonly known as: VENTOLIN  HFA Inhale 2-4 puffs into the lungs every 4 (four) hours as needed for wheezing or shortness of breath.   amLODipine  10 MG tablet Commonly known as: NORVASC  Take 1 tablet (10 mg total) by mouth daily.   Apixaban  Starter Pack (10mg  and 5mg ) Commonly  known as: ELIQUIS  STARTER PACK Take as directed on package: start with two-5mg  tablets twice daily for 7 days. On day 8, switch to one-5mg  tablet twice daily. Replaces: apixaban  5 MG Tabs tablet   apixaban  5 MG Tabs tablet Commonly known as: ELIQUIS  Take 1 tablet (5 mg total) by mouth 2 (two) times daily. Start taking on: February 25, 2024   ARIPiprazole  15 MG tablet Commonly known as: ABILIFY  Take 1 tablet (15 mg total) by mouth daily.   atorvastatin  40 MG tablet Commonly known as: LIPITOR Take 1 tablet (40 mg total) by mouth daily.   Baqsimi  Two Pack 3 MG/DOSE Powd Generic drug: Glucagon  Place 3 mg into the nose as needed for up to 2 doses (Severe low blood sugar). Give 3 mg (one actuation) into a single nostril.   Blood Glucose Monitoring Suppl Devi 1 each by Does not apply route as directed. Dispense based on patient and insurance preference. Use up to four times daily as directed. (FOR ICD-10 E10.9, E11.9). What changed:  when to take this additional instructions   BLOOD GLUCOSE TEST STRIPS Strp 1 each by Does not apply route 3 (three) times daily. Use as directed to check blood sugar. May dispense any manufacturer covered by patient's insurance and fits patient's device. What changed: Another medication with the same name was added. Make sure you understand how and when to take each.   BLOOD GLUCOSE TEST STRIPS Strp 1 each by Does not apply route as directed. Dispense based on patient and insurance preference. Use up to four times daily as directed. (FOR ICD-10 E10.9, E11.9). What changed: You were already taking a medication with the same name, and this prescription was added. Make sure you understand how and when to take each.   gabapentin  600 MG tablet Commonly known as: NEURONTIN  Take 600 mg by mouth 3 (three) times daily.   insulin  glargine 100 UNIT/ML Solostar Pen Commonly known as: LANTUS  Inject 10 Units into the skin daily. May substitute as needed per  insurance. What changed:  how much to take additional instructions   insulin  lispro 100 UNIT/ML KwikPen Commonly known as: HUMALOG  Inject 0-6 Units into the skin 3 (three) times daily with meals. Check Blood Glucose (BG) and inject per scale: BG <150= 0 unit; BG 150-200= 1 unit; BG 201-250= 2 unit; BG 251-300= 3 unit; BG 301-350= 4 unit; BG 351-400= 5 unit; BG >400= 6 unit and Call Primary Care.   Lancet Device Misc 1 each by Does not apply route as directed. Dispense based on patient and insurance preference. Use up to four times daily as directed. (FOR ICD-10 E10.9, E11.9). What changed:  when to take this additional instructions   Lancets Misc 1 each by Does not apply route as directed. Dispense based on patient and insurance preference. Use up to four times daily as directed. (FOR ICD-10 E10.9, E11.9). What changed:  when to take this additional instructions   Pen Needles 31G X 5 MM Misc 1 each by Does not apply route as directed. Dispense based on patient and insurance preference. Use up to four times daily as directed. (FOR ICD-10  E10.9, E11.9). What changed:  when to take this additional instructions   Trelegy Ellipta  100-62.5-25 MCG/ACT Aepb Generic drug: Fluticasone-Umeclidin-Vilant Inhale 1 application  into the lungs daily.               Durable Medical Equipment  (From admission, onward)           Start     Ordered   01/25/24 1250  For home use only DME Walker rolling  Once       Question Answer Comment  Walker: With 5 Inch Wheels   Patient needs a walker to treat with the following condition Gait disturbance      01/25/24 1249            Follow-up Information     Brown, Fallon E, NP Follow up in 6 week(s).   Specialty: Vascular Surgery Contact information: [address] [place] [address] [phone]                Allergies[1]  Consultations: Treatment Team:  Jama Cordella MATSU,  MD   Procedures/Studies: PERIPHERAL VASCULAR CATHETERIZATION Result Date: 01/24/2024 See surgical note for result.  CT Angio Chest PE W and/or Wo Contrast Result Date: 01/23/2024 CLINICAL DATA:  Status post fall with right shoulder pain. Known history of pulmonary embolism, recently discontinued anticoagulation. EXAM: CT ANGIOGRAPHY CHEST WITH CONTRAST TECHNIQUE: Multidetector CT imaging of the chest was performed using the standard protocol during bolus administration of intravenous contrast. Multiplanar CT image reconstructions and MIPs were obtained to evaluate the vascular anatomy. RADIATION DOSE REDUCTION: This exam was performed according to the departmental dose-optimization program which includes automated exposure control, adjustment of the mA and/or kV according to patient size and/or use of iterative reconstruction technique. CONTRAST:  75mL OMNIPAQUE  IOHEXOL  350 MG/ML SOLN COMPARISON:  CTA chest dated 06/29/2022 FINDINGS: Cardiovascular: The study is adequate for the evaluation of pulmonary embolism to the level of mid lobar arteries due to motion artifact and beam hardening. There are multifocal filling defects at the main pulmonary artery bifurcation extending into bilateral main pulmonary arteries. Mildly dilated main pulmonary artery measures 3.2 cm. RV: LV ratio greater than 1. no significant pericardial fluid/thickening. Coronary artery calcifications. Mediastinum/Nodes: Imaged thyroid  gland without nodules meeting criteria for imaging follow-up by size. Normal esophagus. No pathologically enlarged axillary, supraclavicular, mediastinal, or hilar lymph nodes. Lungs/Pleura: The central airways are patent. No focal consolidation. No pneumothorax. No pleural effusion. Upper abdomen: Normal. Musculoskeletal: No acute or abnormal lytic or blastic osseous lesions. Multilevel degenerative changes of the thoracic spine. Review of the MIP images confirms the above findings. IMPRESSION: 1.  Multifocal pulmonary emboli at the main pulmonary artery bifurcation extending into bilateral main pulmonary arteries. 2. RV: LV ratio greater than 1, suggestive of right heart strain. 3. Mildly dilated main pulmonary artery measures 3.2 cm, which can be seen in the setting of pulmonary arterial hypertension. 4. Coronary artery calcifications. Assessment for potential risk factor modification, dietary therapy or pharmacologic therapy may be warranted, if clinically indicated. Critical Value/emergent results were called by telephone at the time of interpretation on 01/23/2024 at 10:28 am to provider PHILLIP STAFFORD , who verbally acknowledged these results. Electronically Signed   By: Limin  Xu M.D.   On: 01/23/2024 10:29   US  Venous Img Lower Unilateral Left Result Date: 01/23/2024 EXAM: ULTRASOUND DUPLEX OF THE LEFT LOWER EXTREMITY VEINS TECHNIQUE: Duplex ultrasound using B-mode/gray scaled imaging and Doppler spectral analysis and color flow was obtained of the deep venous structures  of the left lower extremity. COMPARISON: Bilateral lower extremity Doppler ultrasound 05/25/last year. CLINICAL HISTORY: 58 year old male with left lower extremity edema and pain. FINDINGS: The common femoral vein, femoral vein, popliteal vein, and visible calf veins demonstrate normal compressibility with normal color flow and spectral analysis. Subcutaneous edema visible at the calf on image 23. Contralateral visible right common femoral vein is patent. IMPRESSION: 1. No evidence of left lower extremity DVT. 2. Subcutaneous edema in the left calf. Electronically signed by: Helayne Hurst MD 01/23/2024 08:31 AM EST RP Workstation: [id number]   DG Tibia/Fibula Left Result Date: 01/23/2024 EXAM: 3 VIEW(S) XRAY OF THE LEFT TIBIA AND FIBULA 01/23/2024 07:40:20 AM COMPARISON: None available. CLINICAL HISTORY: LLE edema, pain FINDINGS: BONES AND JOINTS: No acute fracture. No malalignment. SOFT TISSUES: Diffuse soft tissue swelling.  Vascular calcifications. IMPRESSION: 1. Diffuse soft tissue swelling of the left lower extremity. 2. No acute osseous findings. 3. Vascular calcifications within the left lower extremity arteries. Electronically signed by: Waddell Calk MD 01/23/2024 07:46 AM EST RP Workstation: [id number]   CT Head Wo Contrast Result Date: 01/22/2024 EXAM: CT HEAD WITHOUT 01/22/2024 12:19:17 PM TECHNIQUE: CT of the head was performed without the administration of intravenous contrast. Automated exposure control, iterative reconstruction, and/or weight based adjustment of the mA/kV was utilized to reduce the radiation dose to as low as reasonably achievable. COMPARISON: Brain MRI 06/29/2022 and Head CT 01/06/2023. CLINICAL HISTORY: 58 year old male status post fall. FINDINGS: BRAIN AND VENTRICLES: No acute intracranial hemorrhage. No mass effect or midline shift. No extra-axial fluid collection. No evidence of acute infarct. No hydrocephalus. Stable brain volume. Chronic encephalomalacia in the right hemisphere corresponding to the MCA territory, and additional confluent bilateral chronic cerebral white matter hypodensity. Chronic lacunar infarcts in the bilateral deep gray nuclei. Gray white differentiation appears stable. Chronically severe calcified atherosclerosis at the skull base. Intermittent mild motion artifact. ORBITS: No acute orbital injury. SINUSES AND MASTOIDS: Visible paranasal sinuses, tympanic cavities and mastoids remain well aerated. SOFT TISSUES AND SKULL: No acute skull fracture. Chronic vertex scalp soft tissue scarring. No acute scalp soft tissue injury. No acute soft tissue abnormality. IMPRESSION: 1. Mild motion artifact. 2. No acute traumatic injury identified. 3. Stable Non-contrast CT appearance of advanced chronic ischemic disease. Electronically signed by: Helayne Hurst MD 01/22/2024 12:39 PM EST RP Workstation: [id number]      Discharge Exam: Vitals:   01/25/24 1100 01/25/24 1200  BP: (!)  156/87 (!) 156/90  Pulse: 83 82  Resp: 17 (!) 21  Temp:    SpO2: 95% 94%   Vitals:   01/25/24 1000 01/25/24 1030 01/25/24 1100 01/25/24 1200  BP: (!) 162/98  (!) 156/87 (!) 156/90  Pulse: 86 84 83 82  Resp: (!) 22 17 17  (!) 21  Temp:      TempSrc:      SpO2: 94% 94% 95% 94%  Weight:      Height:        Constitutional:  Normal appearance. Non toxic-appearing.  HENT: Head Normocephalic and atraumatic.  Mucous membranes are moist.  Eyes:  Extraocular intact. Conjunctivae normal.  Cardiovascular: Rate and Rhythm: Normal rate and regular rhythm.  Pulmonary: Non labored, symmetric rise of chest wall.  Skin: warm and dry. not jaundiced.  Neurological: No focal deficit present. alert. Oriented.  Psychiatric: Mood and Affect congruent.    The results of significant diagnostics from this hospitalization (including imaging, microbiology, ancillary and laboratory) are listed below for reference.     Microbiology: Recent Results (from the  past 240 hours)  Resp panel by RT-PCR (RSV, Flu A&B, Covid) Anterior Nasal Swab     Status: None   Collection Time: 01/23/24  7:35 AM   Specimen: Anterior Nasal Swab  Result Value Ref Range Status   SARS Coronavirus 2 by RT PCR NEGATIVE NEGATIVE Final    Comment: (NOTE) SARS-CoV-2 target nucleic acids are NOT DETECTED.  The SARS-CoV-2 RNA is generally detectable in upper respiratory specimens during the acute phase of infection. The lowest concentration of SARS-CoV-2 viral copies this assay can detect is 138 copies/mL. A negative result does not preclude SARS-Cov-2 infection and should not be used as the sole basis for treatment or other patient management decisions. A negative result may occur with  improper specimen collection/handling, submission of specimen other than nasopharyngeal swab, presence of viral mutation(s) within the areas targeted by this assay, and inadequate number of viral copies(<138 copies/mL). A negative result must be  combined with clinical observations, patient history, and epidemiological information. The expected result is Negative.  Fact Sheet for Patients:  bloggercourse.com  Fact Sheet for Healthcare Providers:  seriousbroker.it  This test is no t yet approved or cleared by the [place]  FDA and  has been authorized for detection and/or diagnosis of SARS-CoV-2 by FDA under an Emergency Use Authorization (EUA). This EUA will remain  in effect (meaning this test can be used) for the duration of the COVID-19 declaration under Section 564(b)(1) of the Act, 21 U.S.C.section 360bbb-3(b)(1), unless the authorization is terminated  or revoked sooner.       Influenza A by PCR NEGATIVE NEGATIVE Final   Influenza B by PCR NEGATIVE NEGATIVE Final    Comment: (NOTE) The Xpert Xpress SARS-CoV-2/FLU/RSV plus assay is intended as an aid in the diagnosis of influenza from Nasopharyngeal swab specimens and should not be used as a sole basis for treatment. Nasal washings and aspirates are unacceptable for Xpert Xpress SARS-CoV-2/FLU/RSV testing.  Fact Sheet for Patients: bloggercourse.com  Fact Sheet for Healthcare Providers: seriousbroker.it  This test is not yet approved or cleared by the [place]  FDA and has been authorized for detection and/or diagnosis of SARS-CoV-2 by FDA under an Emergency Use Authorization (EUA). This EUA will remain in effect (meaning this test can be used) for the duration of the COVID-19 declaration under Section 564(b)(1) of the Act, 21 U.S.C. section 360bbb-3(b)(1), unless the authorization is terminated or revoked.     Resp Syncytial Virus by PCR NEGATIVE NEGATIVE Final    Comment: (NOTE) Fact Sheet for Patients: bloggercourse.com  Fact Sheet for Healthcare Providers: seriousbroker.it  This test is not yet  approved or cleared by the [place]  FDA and has been authorized for detection and/or diagnosis of SARS-CoV-2 by FDA under an Emergency Use Authorization (EUA). This EUA will remain in effect (meaning this test can be used) for the duration of the COVID-19 declaration under Section 564(b)(1) of the Act, 21 U.S.C. section 360bbb-3(b)(1), unless the authorization is terminated or revoked.  Performed at [facility] [address] [place], [place] [address]   MRSA Next Gen by PCR, Nasal     Status: None   Collection Time: 01/23/24  4:37 PM   Specimen: Nasal Mucosa; Nasal Swab  Result Value Ref Range Status   MRSA by PCR Next Gen NOT DETECTED NOT DETECTED Final    Comment: (NOTE) The GeneXpert MRSA Assay (FDA approved for NASAL specimens only), is one component of a comprehensive MRSA colonization surveillance program. It is not intended to diagnose  MRSA infection nor to guide or monitor treatment for MRSA infections. Test performance is not FDA approved in patients less than 60 years old. Performed at [facility], [address] [place], [place] [address]      Labs: BNP (last 3 results) Recent Labs    02/21/23 1949 02/22/23 1558  BNP 387.4* 32.7   Basic Metabolic Panel: Recent Labs  Lab 01/22/24 1105 01/23/24 0846 01/24/24 0144 01/25/24 0339  NA 140 142 141 138  K 3.7 3.1* 3.0* 3.3*  CL 103 111 105 104  CO2 27 22 26 24   GLUCOSE 325* 210* 125* 250*  BUN 17 10 12 14   CREATININE 1.10 0.70 0.79 0.98  CALCIUM  9.3 7.9* 8.9 8.7*  MG  --   --   --  1.9  PHOS  --   --   --  3.0   Liver Function Tests: Recent Labs  Lab 01/22/24 1105 01/23/24 0846 01/25/24 0339  AST 12* 13* 11*  ALT 6 <5 6  ALKPHOS 137* 101 112  BILITOT 0.3 0.3 0.3  PROT 6.3* 5.2* 5.3*  ALBUMIN 3.8 3.2* 3.1*   No results for input(s): LIPASE, AMYLASE in the last 168 hours. No results for input(s): AMMONIA  in the last 168 hours. CBC: Recent Labs  Lab  01/22/24 1105 01/23/24 0733 01/24/24 0144 01/25/24 0339  WBC 4.9 6.5 4.6 5.0  HGB 12.9* 14.3 12.5* 11.4*  HCT 40.1 44.6 38.7* 34.0*  MCV 94.6 93.9 93.3 90.9  PLT 201 230 197 182   Cardiac Enzymes: No results for input(s): CKTOTAL, CKMB, CKMBINDEX, TROPONINI in the last 168 hours. BNP: Invalid input(s): POCBNP CBG: Recent Labs  Lab 01/24/24 1113 01/24/24 1639 01/24/24 2104 01/25/24 0741 01/25/24 1129  GLUCAP 143* 236* 258* 247* 277*   D-Dimer No results for input(s): DDIMER in the last 72 hours. Hgb A1c Recent Labs    01/23/24 1145  HGBA1C 12.7*   Lipid Profile No results for input(s): CHOL, HDL, LDLCALC, TRIG, CHOLHDL, LDLDIRECT in the last 72 hours. Thyroid  function studies No results for input(s): TSH, T4TOTAL, T3FREE, THYROIDAB in the last 72 hours.  Invalid input(s): FREET3 Anemia work up No results for input(s): VITAMINB12, FOLATE, FERRITIN, TIBC, IRON, RETICCTPCT in the last 72 hours. Urinalysis    Component Value Date/Time   COLORURINE YELLOW (A) 01/22/2024 1649   APPEARANCEUR HAZY (A) 01/22/2024 1649   LABSPEC 1.039 (H) 01/22/2024 1649   PHURINE 5.0 01/22/2024 1649   GLUCOSEU >=500 (A) 01/22/2024 1649   HGBUR NEGATIVE 01/22/2024 1649   BILIRUBINUR NEGATIVE 01/22/2024 1649   KETONESUR NEGATIVE 01/22/2024 1649   PROTEINUR 100 (A) 01/22/2024 1649   NITRITE NEGATIVE 01/22/2024 1649   LEUKOCYTESUR NEGATIVE 01/22/2024 1649   Sepsis Labs Recent Labs  Lab 01/22/24 1105 01/23/24 0733 01/24/24 0144 01/25/24 0339  WBC 4.9 6.5 4.6 5.0   Microbiology Recent Results (from the past 240 hours)  Resp panel by RT-PCR (RSV, Flu A&B, Covid) Anterior Nasal Swab     Status: None   Collection Time: 01/23/24  7:35 AM   Specimen: Anterior Nasal Swab  Result Value Ref Range Status   SARS Coronavirus 2 by RT PCR NEGATIVE NEGATIVE Final    Comment: (NOTE) SARS-CoV-2 target nucleic acids are NOT DETECTED.  The  SARS-CoV-2 RNA is generally detectable in upper respiratory specimens during the acute phase of infection. The lowest concentration of SARS-CoV-2 viral copies this assay can detect is 138 copies/mL. A negative result does not preclude SARS-Cov-2 infection and should not be  used as the sole basis for treatment or other patient management decisions. A negative result may occur with  improper specimen collection/handling, submission of specimen other than nasopharyngeal swab, presence of viral mutation(s) within the areas targeted by this assay, and inadequate number of viral copies(<138 copies/mL). A negative result must be combined with clinical observations, patient history, and epidemiological information. The expected result is Negative.  Fact Sheet for Patients:  bloggercourse.com  Fact Sheet for Healthcare Providers:  seriousbroker.it  This test is no t yet approved or cleared by the [place]  FDA and  has been authorized for detection and/or diagnosis of SARS-CoV-2 by FDA under an Emergency Use Authorization (EUA). This EUA will remain  in effect (meaning this test can be used) for the duration of the COVID-19 declaration under Section 564(b)(1) of the Act, 21 U.S.C.section 360bbb-3(b)(1), unless the authorization is terminated  or revoked sooner.       Influenza A by PCR NEGATIVE NEGATIVE Final   Influenza B by PCR NEGATIVE NEGATIVE Final    Comment: (NOTE) The Xpert Xpress SARS-CoV-2/FLU/RSV plus assay is intended as an aid in the diagnosis of influenza from Nasopharyngeal swab specimens and should not be used as a sole basis for treatment. Nasal washings and aspirates are unacceptable for Xpert Xpress SARS-CoV-2/FLU/RSV testing.  Fact Sheet for Patients: bloggercourse.com  Fact Sheet for Healthcare Providers: seriousbroker.it  This test is not yet approved or  cleared by the [place]  FDA and has been authorized for detection and/or diagnosis of SARS-CoV-2 by FDA under an Emergency Use Authorization (EUA). This EUA will remain in effect (meaning this test can be used) for the duration of the COVID-19 declaration under Section 564(b)(1) of the Act, 21 U.S.C. section 360bbb-3(b)(1), unless the authorization is terminated or revoked.     Resp Syncytial Virus by PCR NEGATIVE NEGATIVE Final    Comment: (NOTE) Fact Sheet for Patients: bloggercourse.com  Fact Sheet for Healthcare Providers: seriousbroker.it  This test is not yet approved or cleared by the [place]  FDA and has been authorized for detection and/or diagnosis of SARS-CoV-2 by FDA under an Emergency Use Authorization (EUA). This EUA will remain in effect (meaning this test can be used) for the duration of the COVID-19 declaration under Section 564(b)(1) of the Act, 21 U.S.C. section 360bbb-3(b)(1), unless the authorization is terminated or revoked.  Performed at [facility], [address] [place], [place] [address]   MRSA Next Gen by PCR, Nasal     Status: None   Collection Time: 01/23/24  4:37 PM   Specimen: Nasal Mucosa; Nasal Swab  Result Value Ref Range Status   MRSA by PCR Next Gen NOT DETECTED NOT DETECTED Final    Comment: (NOTE) The GeneXpert MRSA Assay (FDA approved for NASAL specimens only), is one component of a comprehensive MRSA colonization surveillance program. It is not intended to diagnose MRSA infection nor to guide or monitor treatment for MRSA infections. Test performance is not FDA approved in patients less than 36 years old. Performed at [facility], [address] [place], [place] [address]      Time coordinating discharge: 32 min   SIGNED: Gael Delude, DO [facility] 01/25/2024, 1:00 PM Pager   If 7PM-7AM, please contact night-coverage     [1]   Allergies Allergen Reactions   Haldol  [Haloperidol  Lactate]

## 2024-01-25 NOTE — Progress Notes (Signed)
 PHARMACY CONSULT NOTE - FOLLOW UP  Pharmacy Consult for Electrolyte Monitoring and Replacement   Recent Labs: Potassium (mmol/L)  Date Value  01/25/2024 3.3 (L)   Magnesium  (mg/dL)  Date Value  87/82/7974 1.9   Calcium  (mg/dL)  Date Value  87/82/7974 8.7 (L)   Albumin (g/dL)  Date Value  87/82/7974 3.1 (L)  10/20/2017 3.8   Phosphorus (mg/dL)  Date Value  87/82/7974 3.0   Sodium (mmol/L)  Date Value  01/25/2024 138  10/20/2017 145 (H)     Assessment: 58 y/o male with a h/o HTN, substance abuse, CVA/TIA, MDD, DM, neuropathy, schizophrenia, COPD, HLD, CHF and frequent falls who is admitted with massive PE now s/p thrombectomy 12/16. Pharmacy is asked to follow and replace electrolytes while in CCU  Goal of Therapy:  Electrolytes WNL  Plan:  ---40 mEq po KCl x 1 ---recheck electrolytes in am  Adriana JONETTA Bolster ,PharmD Clinical Pharmacist 01/25/2024 7:25 AM

## 2024-01-26 ENCOUNTER — Emergency Department

## 2024-01-26 ENCOUNTER — Other Ambulatory Visit: Payer: Self-pay

## 2024-01-26 LAB — CBG MONITORING, ED
Glucose-Capillary: 147 mg/dL — ABNORMAL HIGH (ref 70–99)
Glucose-Capillary: 154 mg/dL — ABNORMAL HIGH (ref 70–99)
Glucose-Capillary: 213 mg/dL — ABNORMAL HIGH (ref 70–99)
Glucose-Capillary: 234 mg/dL — ABNORMAL HIGH (ref 70–99)

## 2024-01-26 LAB — URINALYSIS, ROUTINE W REFLEX MICROSCOPIC
Bilirubin Urine: NEGATIVE
Glucose, UA: >=500 mg/dL — AB
Hgb urine dipstick: NEGATIVE
Ketones, ur: NEGATIVE mg/dL
Nitrite: NEGATIVE
Protein, ur: NEGATIVE mg/dL
Specific Gravity, Urine: 1.013 (ref 1.005–1.030)
pH: 7 (ref 5.0–8.0)

## 2024-01-26 LAB — PRO BRAIN NATRIURETIC PEPTIDE: Pro Brain Natriuretic Peptide: 494 pg/mL — ABNORMAL HIGH (ref <300.0)

## 2024-01-26 LAB — TROPONIN T, HIGH SENSITIVITY
Troponin T High Sensitivity: 33 ng/L — ABNORMAL HIGH (ref 0–19)
Troponin T High Sensitivity: 33 ng/L — ABNORMAL HIGH (ref 0–19)

## 2024-01-26 MED ORDER — APIXABAN 5 MG PO TABS
10.0000 mg | ORAL_TABLET | Freq: Two times a day (BID) | ORAL | Status: DC
  Administered 2024-01-26 – 2024-01-30 (×9): 10 mg via ORAL
  Filled 2024-01-26 (×9): qty 2

## 2024-01-26 MED ORDER — INSULIN GLARGINE 100 UNIT/ML ~~LOC~~ SOLN
10.0000 [IU] | Freq: Every day | SUBCUTANEOUS | Status: DC
  Administered 2024-01-26 – 2024-01-30 (×5): 10 [IU] via SUBCUTANEOUS
  Filled 2024-01-26 (×5): qty 0.1

## 2024-01-26 MED ORDER — ATORVASTATIN CALCIUM 20 MG PO TABS
40.0000 mg | ORAL_TABLET | Freq: Every day | ORAL | Status: DC
  Administered 2024-01-27 – 2024-01-30 (×4): 40 mg via ORAL
  Filled 2024-01-26 (×4): qty 2

## 2024-01-26 MED ORDER — INSULIN ASPART 100 UNIT/ML IJ SOLN
0.0000 [IU] | Freq: Three times a day (TID) | INTRAMUSCULAR | Status: DC
  Administered 2024-01-26 – 2024-01-27 (×3): 5 [IU] via SUBCUTANEOUS
  Administered 2024-01-27: 2 [IU] via SUBCUTANEOUS
  Administered 2024-01-27: 3 [IU] via SUBCUTANEOUS
  Administered 2024-01-28: 2 [IU] via SUBCUTANEOUS
  Administered 2024-01-28: 5 [IU] via SUBCUTANEOUS
  Administered 2024-01-28: 8 [IU] via SUBCUTANEOUS
  Administered 2024-01-29: 2 [IU] via SUBCUTANEOUS
  Administered 2024-01-29 – 2024-01-30 (×3): 3 [IU] via SUBCUTANEOUS
  Filled 2024-01-26: qty 5
  Filled 2024-01-26: qty 2
  Filled 2024-01-26: qty 5
  Filled 2024-01-26: qty 2
  Filled 2024-01-26: qty 3
  Filled 2024-01-26: qty 4
  Filled 2024-01-26: qty 3
  Filled 2024-01-26: qty 8
  Filled 2024-01-26: qty 3
  Filled 2024-01-26: qty 2
  Filled 2024-01-26: qty 3
  Filled 2024-01-26 (×2): qty 5

## 2024-01-26 MED ORDER — ARIPIPRAZOLE 15 MG PO TABS
15.0000 mg | ORAL_TABLET | Freq: Every day | ORAL | Status: DC
  Administered 2024-01-26 – 2024-01-30 (×5): 15 mg via ORAL
  Filled 2024-01-26 (×5): qty 1

## 2024-01-26 MED ORDER — GABAPENTIN 300 MG PO CAPS
600.0000 mg | ORAL_CAPSULE | Freq: Three times a day (TID) | ORAL | Status: DC
  Administered 2024-01-26 – 2024-01-30 (×12): 600 mg via ORAL
  Filled 2024-01-26 (×12): qty 2

## 2024-01-26 MED ORDER — APIXABAN 5 MG PO TABS: 5.0000 mg | ORAL_TABLET | Freq: Two times a day (BID) | ORAL | Status: DC

## 2024-01-26 MED ORDER — AMLODIPINE BESYLATE 5 MG PO TABS
10.0000 mg | ORAL_TABLET | Freq: Every day | ORAL | Status: DC
  Administered 2024-01-26 – 2024-01-30 (×5): 10 mg via ORAL
  Filled 2024-01-26 (×5): qty 2

## 2024-01-26 MED ORDER — INSULIN ASPART 100 UNIT/ML IJ SOLN
0.0000 [IU] | Freq: Every day | INTRAMUSCULAR | Status: DC
  Administered 2024-01-26 – 2024-01-28 (×2): 2 [IU] via SUBCUTANEOUS
  Filled 2024-01-26 (×2): qty 2

## 2024-01-26 NOTE — NC FL2 (Signed)
 [place]  MEDICAID [id number] LEVEL OF CARE FORM     IDENTIFICATION  Patient Name: Henry Munoz Birthdate: [birth year] Sex: male Admission Date (Current Location): 01/25/2024  [place] and [place] Number:  [place] [id number] Facility and Address:  [facility], [address] [place], [place] [address]      Provider Number: [id number]  Attending Physician Name and Address:  Dicky Anes, MD  Relative Name and Phone Number:  Josede Cicero [phone]    Current Level of Care: Hospital Recommended Level of Care: Skilled Nursing Facility Prior Approval Number:    Date Approved/Denied:   PASRR Number: [id number]  Discharge Plan: Other (Comment) (Acute IP Rehab)    Current Diagnoses: Patient Active Problem List   Diagnosis Date Noted   Morbid obesity (HCC) 01/25/2024   Acute hypoxic respiratory failure (HCC) 02/22/2023   Polysubstance abuse (HCC) 02/22/2023   (HFpEF) heart failure with preserved ejection fraction (HCC) 02/22/2023   COPD exacerbation (HCC) 02/21/2023   Acute respiratory failure with hypoxia (HCC) 02/21/2023   Dyslipidemia 02/21/2023   Diabetic neuropathy (HCC) 02/21/2023   History of pulmonary embolism 02/21/2023   History of CVA (cerebrovascular accident) 02/21/2023   Auditory hallucinations 07/11/2022   Suicidal ideation 07/11/2022   Cocaine use 07/04/2022   Schizophrenia (HCC) 07/03/2022   PE (pulmonary thromboembolism) (HCC) 07/02/2022   Positive blood culture 06/30/2022   Cocaine abuse (HCC) 06/30/2022   Uncontrolled type 2 diabetes mellitus with hyperglycemia, with long-term current use of insulin  (HCC) 06/30/2022   Pseudohyponatremia 06/30/2022   AKI (acute kidney injury) 06/30/2022   Hyperkalemia 06/30/2022   IV infiltration, initial encounter 06/30/2022   Pulmonary embolus (HCC) 06/29/2022   Neuropathy, arm, left 10/15/2017   Acute encephalopathy 10/10/2017   Opiate abuse, episodic (HCC) 10/10/2017   MDD (major  depressive disorder) 08/12/2016   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   Tobacco use disorder 08/10/2016   Cocaine use disorder, severe, dependence (HCC) 08/10/2016   Alcohol use disorder, moderate, dependence (HCC) 08/10/2016   Cerebrovascular accident (CVA) (HCC) 08/10/2016   Diabetes (HCC) 04/27/2016   Essential hypertension 02/19/2016    Orientation RESPIRATION BLADDER Height & Weight     Self, Time, Situation, Place  Normal Continent Weight:   Height:     BEHAVIORAL SYMPTOMS/MOOD NEUROLOGICAL BOWEL NUTRITION STATUS      Continent Diet (Diet regular Fluid consistency: Thin: General starting at 12/18 0947  Fall precautions starting at 12/18 0223)  AMBULATORY STATUS COMMUNICATION OF NEEDS Skin   Extensive Assist Verbally                         Personal Care Assistance Level of Assistance  Bathing, Dressing Bathing Assistance: Limited assistance   Dressing Assistance: Limited assistance     Functional Limitations Info             SPECIAL CARE FACTORS FREQUENCY  PT (By licensed PT), OT (By licensed OT)     PT Frequency: 2x OT Frequency: 2x            Contractures Contractures Info: Not present    Additional Factors Info  Allergies   Allergies Info: Haldol            Current Medications (01/26/2024):  This is the current hospital active medication list Current Facility-Administered Medications  Medication Dose Route Frequency Provider Last Rate Last Admin   amLODipine  (NORVASC ) tablet 10 mg  10 mg Oral Daily Quale, Mark, MD   10 mg at  01/26/24 1126   apixaban  (ELIQUIS ) tablet 10 mg  10 mg Oral BID Dicky Anes, MD   10 mg at 01/26/24 1126   Followed by   NOREEN ON 02/02/2024] apixaban  (ELIQUIS ) tablet 5 mg  5 mg Oral BID Dicky Anes, MD       ARIPiprazole  (ABILIFY ) tablet 15 mg  15 mg Oral Daily Quale, Mark, MD   15 mg at 01/26/24 1125   [START ON 01/27/2024] atorvastatin  (LIPITOR) tablet 40 mg  40 mg Oral  Daily Dicky Anes, MD       gabapentin  (NEURONTIN ) capsule 600 mg  600 mg Oral TID Dicky Anes, MD       insulin  aspart (novoLOG ) injection 0-15 Units  0-15 Units Subcutaneous TID WC Dicky Anes, MD   5 Units at 01/26/24 1331   insulin  aspart (novoLOG ) injection 0-5 Units  0-5 Units Subcutaneous QHS Quale, Mark, MD       insulin  glargine (LANTUS ) injection 10 Units  10 Units Subcutaneous Daily Quale, Mark, MD   10 Units at 01/26/24 1332   Current Outpatient Medications  Medication Sig Dispense Refill   albuterol  (VENTOLIN  HFA) 108 (90 Base) MCG/ACT inhaler Inhale 2-4 puffs into the lungs every 4 (four) hours as needed for wheezing or shortness of breath. 18 g 2   APIXABAN  (ELIQUIS ) VTE STARTER PACK (10MG  AND 5MG ) Take as directed on package: start with two-5mg  tablets twice daily for 7 days. On day 8, switch to one-5mg  tablet twice daily. 74 each 0   atorvastatin  (LIPITOR) 40 MG tablet Take 1 tablet (40 mg total) by mouth daily. 30 tablet 0   amLODipine  (NORVASC ) 10 MG tablet Take 1 tablet (10 mg total) by mouth daily. 30 tablet 0   [START ON 02/25/2024] apixaban  (ELIQUIS ) 5 MG TABS tablet Take 1 tablet (5 mg total) by mouth 2 (two) times daily. (Patient not taking: Reported on 01/26/2024) 60 tablet 1   ARIPiprazole  (ABILIFY ) 15 MG tablet Take 1 tablet (15 mg total) by mouth daily. 30 tablet 0   Blood Glucose Monitoring Suppl DEVI 1 each by Does not apply route as directed. Dispense based on patient and insurance preference. Use up to four times daily as directed. (FOR ICD-10 E10.9, E11.9). 1 each 0   Fluticasone-Umeclidin-Vilant (TRELEGY ELLIPTA ) 100-62.5-25 MCG/ACT AEPB Inhale 1 application  into the lungs daily. (Patient not taking: Reported on 01/23/2024) 30 each 2   gabapentin  (NEURONTIN ) 600 MG tablet Take 600 mg by mouth 3 (three) times daily. (Patient not taking: Reported on 01/26/2024)     Glucagon  (BAQSIMI  TWO PACK) 3 MG/DOSE POWD Place 3 mg into the nose as needed for up to 2 doses (Severe  low blood sugar). Give 3 mg (one actuation) into a single nostril. 1 each 0   Glucose Blood (BLOOD GLUCOSE TEST STRIPS) STRP 1 each by Does not apply route 3 (three) times daily. Use as directed to check blood sugar. May dispense any manufacturer covered by patient's insurance and fits patient's device. 100 strip 2   Glucose Blood (BLOOD GLUCOSE TEST STRIPS) STRP 1 each by Does not apply route as directed. Dispense based on patient and insurance preference. Use up to four times daily as directed. (FOR ICD-10 E10.9, E11.9). 100 each 0   insulin  glargine (LANTUS ) 100 UNIT/ML Solostar Pen Inject 10 Units into the skin daily. May substitute as needed per insurance. (Patient not taking: Reported on 01/26/2024) 15 mL 0   insulin  lispro (HUMALOG ) 100 UNIT/ML KwikPen Inject 0-6 Units into the skin  3 (three) times daily with meals. Check Blood Glucose (BG) and inject per scale: BG <150= 0 unit; BG 150-200= 1 unit; BG 201-250= 2 unit; BG 251-300= 3 unit; BG 301-350= 4 unit; BG 351-400= 5 unit; BG >400= 6 unit and Call Primary Care. (Patient not taking: Reported on 01/26/2024) 15 mL 0   Insulin  Pen Needle (PEN NEEDLES) 31G X 5 MM MISC 1 each by Does not apply route as directed. Dispense based on patient and insurance preference. Use up to four times daily as directed. (FOR ICD-10 E10.9, E11.9). 100 each 0     Discharge Medications: Please see discharge summary for a list of discharge medications.  Relevant Imaging Results:  Relevant Lab Results:   Additional Information [phone]  Katelynn Heidler L Darnice [place], [place]

## 2024-01-26 NOTE — Evaluation (Addendum)
 Physical Therapy Evaluation Patient Details Name: Henry Munoz MRN: [id number] DOB: Aug 25, 1965 Today's Date: 01/26/2024  History of Present Illness  Henry Munoz is a 58 y.o. male who presented to ED on 01/25/24 with c/o of multiple falls. PMH significant of PE nonadherent with Eliquis , HTN, chronic HFpEF, cocaine abuse, COPD, IIDM, schizoaffective disorder, CVA presented to ED with worsening L shoulder pain and frequent falls. Pt found to have PE on recent 12/15 admission and underwent thrombectomy on 01/24/24.  Clinical Impression  Pt is a 58 y.o. male admitted for the above. Pt received in ED hallway bed-- alert and agreeable to therapy. Performed all bed mobility with CGA. Pt completed 1 STS without AD to simulate his PLOF-- +2 HHA and MOD A for steadiness d/t posterior lean. Attempted STS with RW x3 with 2+ MIN A, took fwd/back and lateral steps at bedside with notable difficulty in advancing LLE. Returned to hallway bed with all needs met. Continue to follow during hospitalization to address deficits in strength, activity tolerance, and functional mobility.          If plan is discharge home, recommend the following: Two people to help with walking and/or transfers;Two people to help with bathing/dressing/bathroom;Assist for transportation;Help with stairs or ramp for entrance   Can travel by private vehicle   No    Equipment Recommendations Other (comment) (TBD at next venue)  Recommendations for Other Services       Functional Status Assessment Patient has had a recent decline in their functional status and demonstrates the ability to make significant improvements in function in a reasonable and predictable amount of time.     Precautions / Restrictions Precautions Precautions: Fall Recall of Precautions/Restrictions: Impaired Restrictions Weight Bearing Restrictions Per Provider Order: No      Mobility  Bed Mobility Overal bed mobility: Needs Assistance Bed  Mobility: Supine to Sit, Sit to Supine     Supine to sit: Contact guard Sit to supine: Contact guard assist        Transfers Overall transfer level: Needs assistance Equipment used: Rolling walker (2 wheels) Transfers: Sit to/from Stand Sit to Stand: Min assist, From elevated surface           General transfer comment: MOD A + HHA for STS from stretcher, MIN A + RW x3 STS from stretcher; +2 safety    Ambulation/Gait Ambulation/Gait assistance: Min assist, +2 physical assistance Gait Distance (Feet): 3 Feet Assistive device: Rolling walker (2 wheels) Gait Pattern/deviations: Narrow base of support, Decreased stride length, Step-to pattern, Decreased step length - left       General Gait Details: significant difficulty advancing LLE with forward/backward and side steps  Stairs            Wheelchair Mobility     Tilt Bed    Modified Rankin (Stroke Patients Only)       Balance Overall balance assessment: Needs assistance Sitting-balance support: Feet supported, Single extremity supported Sitting balance-Leahy Scale: Poor   Postural control: Posterior lean Standing balance support: Bilateral upper extremity supported Standing balance-Leahy Scale: Poor Standing balance comment: reliant on RW for support                             Pertinent Vitals/Pain Pain Assessment Pain Assessment: 0-10 Pain Score: 8  Pain Location: R groin and L knee Pain Descriptors / Indicators: Grimacing, Guarding, Discomfort Pain Intervention(s): Limited activity within patient's tolerance, Monitored during session, Repositioned  Home Living Family/patient expects to be discharged to:: Private residence Living Arrangements: Other relatives Available Help at Discharge: Family;Personal care attendant;Available PRN/intermittently Type of Home: House Home Access: Ramped entrance       Home Layout: One level Home Equipment: Agricultural Consultant (2 wheels);Cane -  quad;Shower seat;Grab bars - tub/shower Additional Comments: pt reports brother is disabled, they have an aid 2.5 hours/day for cooking/cleaning    Prior Function Prior Level of Function : Needs assist;History of Falls (last six months)             Mobility Comments: reports IND household mobility no AD use (reports RW doesn't roll on carpet). Significant falls hx x8 in 2 weeks       Extremity/Trunk Assessment   Upper Extremity Assessment Upper Extremity Assessment: Right hand dominant;LUE deficits/detail LUE Deficits / Details: hx CVA with residual weakness    Lower Extremity Assessment Lower Extremity Assessment: Generalized weakness    Cervical / Trunk Assessment Cervical / Trunk Assessment: Normal  Communication   Communication Communication: No apparent difficulties    Cognition Arousal: Alert Behavior During Therapy: WFL for tasks assessed/performed                             Following commands: Impaired Following commands impaired: Follows multi-step commands with increased time     Cueing Cueing Techniques: Verbal cues     General Comments      Exercises     Assessment/Plan    PT Assessment Patient needs continued PT services  PT Problem List Decreased strength;Decreased range of motion;Decreased activity tolerance;Decreased balance;Decreased mobility;Decreased coordination;Decreased knowledge of use of DME;Decreased safety awareness       PT Treatment Interventions DME instruction;Gait training;Stair training;Functional mobility training;Therapeutic activities;Therapeutic exercise;Balance training;Patient/family education    PT Goals (Current goals can be found in the Care Plan section)  Acute Rehab PT Goals Patient Stated Goal: none stated PT Goal Formulation: With patient Time For Goal Achievement: 02/09/24 Potential to Achieve Goals: Good    Frequency Min 2X/week     Co-evaluation PT/OT/SLP Co-Evaluation/Treatment:  Yes Reason for Co-Treatment: For patient/therapist safety;To address functional/ADL transfers PT goals addressed during session: Mobility/safety with mobility OT goals addressed during session: ADL's and self-care       AM-PAC PT 6 Clicks Mobility  Outcome Measure Help needed turning from your back to your side while in a flat bed without using bedrails?: None Help needed moving from lying on your back to sitting on the side of a flat bed without using bedrails?: None Help needed moving to and from a bed to a chair (including a wheelchair)?: A Little Help needed standing up from a chair using your arms (e.g., wheelchair or bedside chair)?: A Little Help needed to walk in hospital room?: A Lot Help needed climbing 3-5 steps with a railing? : A Lot 6 Click Score: 18    End of Session Equipment Utilized During Treatment: Gait belt Activity Tolerance: Patient tolerated treatment well Patient left: in bed Nurse Communication: Mobility status PT Visit Diagnosis: Unsteadiness on feet (R26.81);Other abnormalities of gait and mobility (R26.89);Repeated falls (R29.6);History of falling (Z91.81);Muscle weakness (generalized) (M62.81);Difficulty in walking, not elsewhere classified (R26.2)    Time: 9085-9064 PT Time Calculation (min) (ACUTE ONLY): 21 min   Charges:                 Allena Bulls, SPT  Maryanne Finder, PT, DPT Physical Therapist - [facility] 01/26/2024, 11:11 AM

## 2024-01-26 NOTE — Progress Notes (Signed)
° ° ° °  [facility] REFERRAL        Occupational Therapy * Physical Therapy * Speech Therapy                           DATE: 01/26/2024  PATIENT NAME: Henry Munoz  PATIENT FMW:[id number]       DIAGNOSIS/DIAGNOSIS CODE: High Fall Risk   DATE OF DISCHARGE: 01/26/2024       PRIMARY CARE PHYSICIAN: [facility]       PCP PHONE/FAX:  [phone]      Dear Provider (Name: [facility] outpatient __  Fax: [phone]   I certify that I have examined this patient and that occupational/physical/speech therapy is necessary on an outpatient basis.    The patient has expressed interest in completing their recommended course of therapy at your  location.  Once a formal order from the patient's primary care physician has been obtained, please  contact him/her to schedule an appointment for evaluation at your earliest convenience.   [ ]   Physical Therapy Evaluate and Treat  [  ]  Occupational Therapy Evaluate and Treat  [  ]  Speech Therapy Evaluate and Treat         The patient's primary care physician (listed above) must furnish and be responsible for a formal order such that the recommended services may be furnished while under the primary physician's care, and that the plan of care will be established and reviewed every 30 days (or more often if condition necessitates).

## 2024-01-26 NOTE — ED Provider Notes (Addendum)
 Patient is boarding awaiting [facility] consultation finalization for plan. He was not written for a diet he is currently requesting meal.  Also noted he is needing to resume his medications he was discharged yesterday.  Pharmacist Alex reordering his appropriate medications, insulin  regimen etc.  I will review and cosign  Patient ambulatory no distress.  Resting without issue at this time    Dicky Anes, MD 01/26/24 0950  ----------------------------------------- 11:26 AM on 01/26/2024 ----------------------------------------- Have confirmed, patient was given insulin  and prescription for his anticoagulant through the meds to beds program.  Pharmacy confirmed that his prescriptions are ready at [facility].  I spoke with Dr. Leesa who is his previous hospitalist who knows him well, and patient was issued assistive devices equipment.  Social work also following to get in-home services set up for the patient.  Patient is appropriate for discharge at this time.    Dicky Anes, MD 01/26/24 [phone]

## 2024-01-26 NOTE — TOC Transition Note (Signed)
 Transition of Care [facility]) - Discharge Note   Patient Details  Name: Henry Munoz MRN: [id number] Date of Birth: [birth year]  [facility] CM/SW Contact:  Mostafa Yuan L Abdulla Pooley, LCSW Phone Number: 01/26/2024, 1:50 PM   Clinical Narrative:     Medicaid transportation has been scheduled with [employer] [phone]. [employer] has three hours to process the request.   Patient declined DME. He requested that a referral be made to [facility] for outpatient physical therapy and occupational therapy. That referral was made.   No further TOC needs. TOC signing off.         Patient Goals and CMS Choice            Discharge Placement                       Discharge Plan and Services Additional resources added to the After Visit Summary for                                       Social Drivers of Health (SDOH) Interventions SDOH Screenings   Food Insecurity: No Food Insecurity (01/24/2024)  Housing: Low Risk (01/24/2024)  Transportation Needs: No Transportation Needs (01/24/2024)  Utilities: Not At Risk (01/24/2024)  Alcohol Screen: Low Risk (07/12/2022)  Tobacco Use: High Risk (01/25/2024)     Readmission Risk Interventions    01/25/2024    1:22 PM 02/23/2023    3:36 PM  Readmission Risk Prevention Plan  Transportation Screening Complete Complete  Medication Review (RN Care Manager) Complete Complete  PCP or Specialist appointment within 3-5 days of discharge Complete Complete  HRI or Home Care Consult Complete   SW Recovery Care/Counseling Consult Complete Complete  Palliative Care Screening Not Applicable Not Applicable  Skilled Nursing Facility Not Applicable Not Applicable

## 2024-01-26 NOTE — ED Notes (Signed)
 This RN and Melissa, NT cleaned pt after having an incontinent episode of urine. New chux pads, linens and gown.

## 2024-01-26 NOTE — ED Provider Notes (Signed)
 Patient has been evaluated by physical therapy they are now concerned that he has a two-person assist requirement.  Dr. Leesa noted that they had considered trying to place him during his last hospitalization but per her he had declined that option and opted to leave from the ICU yesterday instead.   At this juncture we will continue the patient in the ED with [facility] for additional safe disposition options consistent with his care needs. Patient is now interested in rehab or placement.    Dicky Anes, MD 01/26/24 1426

## 2024-01-26 NOTE — ED Notes (Signed)
 This RN assisted pt with calling family member at this time.

## 2024-01-26 NOTE — TOC Initial Note (Addendum)
 Transition of Care [facility]) - Initial/Assessment Note    Patient Details  Name: Henry Munoz MRN: [id number] Date of Birth: [birth year]  Transition of Care [facility]) CM/SW Contact:    Keitha Kolk L Brenlee Koskela, LCSW Phone Number: 01/26/2024, 9:20 AM  Clinical Narrative:                  CSW spoke with patients brother, Alm. Alm confirmed that the power was off. He reported that the power will be on today. He stated that the power went off yesterday.   10:44am: CSW spoke with liaison from [facility]. CSW was advised that patient is receiving PCS services. CSW advised that patient could benefit from OT and PT.   Patient has [facility] Medicaid. Requests sent in the portal for home care.   10:55am: Face to Face with patient. Patient was asleep but CSW was able to wake him. Patient advised that [facility] will be providing transportation. He stated, All I have to do is call them.   CSW and patient discussed DME. He declined the [facility]. CSW advised patient that Home Health Care services was requested through the EPIC portal but patient has Medicaid and may not receive services. CSW advised that an outpatient referral for PT/OT could be made. Patient agreeable to receiving services at [facility].   12:45pm: CSW received a call from patient brother. Alm advised that patient needs to go to outpatient PT/OT. He stated that his brother is weak from the surgery. CSW advised Alm that patient was not ready for discharge yesterday from ICU but he insisted on discharging. CSW advised that patient is not making good choices for himself. Alm advised that his brother is stubborn and he fell yesterday as soon as he got to the car port. He stated that he has a walker but won't use it. Patient does not do anything that will make it easier for himself.   Choice for outpatient clinic discussed. Alm advised that he would like his brother to go to the outpatient clinic at [facility].    Alm advised that he did  not want his brother going to a nursing home.      Patient Goals and CMS Choice            Expected Discharge Plan and Services                                              Prior Living Arrangements/Services                       Activities of Daily Living      Permission Sought/Granted                  Emotional Assessment              Admission diagnosis:  Fall Patient Active Problem List   Diagnosis Date Noted   Morbid obesity (HCC) 01/25/2024   Acute hypoxic respiratory failure (HCC) 02/22/2023   Polysubstance abuse (HCC) 02/22/2023   (HFpEF) heart failure with preserved ejection fraction (HCC) 02/22/2023   COPD exacerbation (HCC) 02/21/2023   Acute respiratory failure with hypoxia (HCC) 02/21/2023   Dyslipidemia 02/21/2023   Diabetic neuropathy (HCC) 02/21/2023   History of pulmonary embolism 02/21/2023   History of CVA (cerebrovascular accident) 02/21/2023   Auditory hallucinations 07/11/2022   Suicidal  ideation 07/11/2022   Cocaine use 07/04/2022   Schizophrenia (HCC) 07/03/2022   PE (pulmonary thromboembolism) (HCC) 07/02/2022   Positive blood culture 06/30/2022   Cocaine abuse (HCC) 06/30/2022   Uncontrolled type 2 diabetes mellitus with hyperglycemia, with long-term current use of insulin  (HCC) 06/30/2022   Pseudohyponatremia 06/30/2022   AKI (acute kidney injury) 06/30/2022   Hyperkalemia 06/30/2022   IV infiltration, initial encounter 06/30/2022   Pulmonary embolus (HCC) 06/29/2022   Neuropathy, arm, left 10/15/2017   Acute encephalopathy 10/10/2017   Opiate abuse, episodic (HCC) 10/10/2017   MDD (major depressive disorder) 08/12/2016   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 [place] [address] Phone: [phone] Fax: [phone]  [facility] [address] [place] [facility] [address] [place] [address] Phone: [phone] Fax: [phone]     Social Drivers of Health (SDOH) Social History: SDOH Screenings   Food Insecurity: No Food Insecurity (01/24/2024)  Housing: Low Risk (01/24/2024)  Transportation Needs: No Transportation Needs (01/24/2024)  Utilities: Not At Risk (01/24/2024)  Alcohol Screen: Low Risk (07/12/2022)  Tobacco Use: High Risk (01/25/2024)   SDOH Interventions:     Readmission Risk Interventions    01/25/2024    1:22 PM 02/23/2023    3:36 PM  Readmission Risk Prevention Plan  Transportation Screening Complete Complete  Medication Review (RN Care Manager) Complete Complete  PCP or Specialist appointment within 3-5 days of discharge Complete Complete  HRI or Home Care Consult Complete   SW Recovery Care/Counseling Consult Complete Complete  Palliative Care Screening Not Applicable Not Applicable  Skilled Nursing Facility Not Applicable Not Applicable

## 2024-01-26 NOTE — TOC Progression Note (Addendum)
 Transition of Care [facility]) - Progression Note    Patient Details  Name: Henry Munoz MRN: [id number] Date of Birth: 1965/09/01  Transition of Care [facility]) CM/SW Contact  Jun Osment L Marlia [place], [place] Phone Number: 01/26/2024, 2:28 PM  Clinical Narrative:      Patient will not discharge today. Patient is now interested in rehab. FL2 completed and needing signature. PASSR received. Bed search initiated.     PLEASE NOTE. PATIENT HAS A HISTORY OF SUBSTANCE ABUSE WHICH MAY COMPLICATE TOC LOCATING PLACEMENT IN A SNF.     2:54pm: Bed offer received from [facility]. CSW spoke with the brother, Alm. Alm declined the offer. Patient has been declined by 10 facilities thus far. Alm expressed interest again in IP rehab at [facility]. CSW explained that with patient Medicaid, history of substance abuse and the need for a higher level of care, it is not likely that he will be offered a bed with Acute IP. CSW advised that if the bed offer is not accepted patient has no other choice but to discharge home.   2:58: CSW received a call from [facility]. [place], denied referral. Burnard advised that with the holiday [facility] is differing outside admissions. Referral denied.    3:17pm: Response received from [facility]   *Does not meet IPR criteria, no medical dx for IPR, No medicaid beds available.             Expected Discharge Plan and Services                                               Social Drivers of Health (SDOH) Interventions SDOH Screenings   Food Insecurity: No Food Insecurity (01/24/2024)  Housing: Low Risk (01/24/2024)  Transportation Needs: No Transportation Needs (01/24/2024)  Utilities: Not At Risk (01/24/2024)  Alcohol Screen: Low Risk (07/12/2022)  Tobacco Use: High Risk (01/25/2024)    Readmission Risk Interventions    01/25/2024    1:22 PM 02/23/2023    3:36 PM  Readmission Risk Prevention Plan  Transportation Screening Complete Complete   Medication Review (RN Care Manager) Complete Complete  PCP or Specialist appointment within 3-5 days of discharge Complete Complete  HRI or Home Care Consult Complete   SW Recovery Care/Counseling Consult Complete Complete  Palliative Care Screening Not Applicable Not Applicable  Skilled Nursing Facility Not Applicable Not Applicable

## 2024-01-26 NOTE — Discharge Instructions (Addendum)
 Make certain to continue all of your medications and prescriptions as you were prescribed when you discharged from the hospital yesterday.  Make certain to pick up any new prescriptions and initiate them  Follow up with PCP in 1-2 weeks to review chronic condition management Follow up with vascular surgery as scheduled   A referral was made for you to receive outpatient Physical and Occupational Therapy. Please follow up with:  [facility] [address] [place], [place] [address] Main Phone:[phone]  MEDICAID TRANSPORTATION   In [place] , Medicaid provides free Non-Emergency Medical Transportation (NEMT) for covered appointments, arranged through your managed care health plan (like [facility], [place]) or county DSS, covering rides in personal cars, taxis, vans, or public transit, with options for mileage reimbursement or even lodging for long trips, ensuring access to diagnosis, treatment, and follow-up care for eligible members.   How It Works  Check Your Plan: Your [place] Medicaid Health Plan (Managed Care or Tailored Plan) is responsible for arranging this service.  Request a Ride: Contact your health plan or their transportation broker (like MTM for [facility] members) to schedule a ride to a Medicaid-covered service.   Provide Notice: Give a few days' notice (e.g., 3 business days) for non-urgent trips.  Get Covered: You must have a Medicaid-covered service (like a doctor's visit, therapy) to qualify for the ride.  What's Covered Transportation Modes: Personal vehicles (mileage reimbursement), taxis, vans (including lift-equipped), mini-buses, public transit.   Longer Trips: Meals and lodging for trips 75+ miles one-way.  Other Needs (Plan Specific): Some plans (like [facility]) offer extra rides for rehab, DSS appointments, or community programs.   Who's Eligible   Most [place] Medicaid beneficiaries. You must be in an independent living  situation. Not for [place] [facility] or nursing home residents.  Key Providers/Brokers   MTM: Works with [employer]. [employer]: Manages Tailored Plan transport. [employer]: Offers assistance for various needs. County DSS: Customer Service Manager offices arrange transport.  [employer] [phone] [facility] Complete Health [phone] [employer] [phone] [facility] Plan [phone] [facility] [phone] [facility] [phone] [facility] [phone] [facility] Resources [phone] [facility] Total Care [phone]

## 2024-01-26 NOTE — Progress Notes (Signed)
 Patient is not able to walk the distance required to go the bathroom, or he/she is unable to safely negotiate stairs required to access the bathroom.  A 3in1 BSC will alleviate this problem

## 2024-01-26 NOTE — Evaluation (Signed)
 Occupational Therapy Evaluation Patient Details Name: Henry Munoz MRN: [id number] DOB: [birth year] Today's Date: 01/26/2024   History of Present Illness   Henry Munoz is a 58 y.o. male with medical history significant of PE nonadherent with Eliquis , HTN, chronic HFpEF, cocaine abuse, COPD, IIDM, schizoaffective disorder, CVA presented to ED with worsening L shoulder pain and frequent falls. Pt found to have PE on recent 12/15 admission and underwent  thrombectomy on 01/24/24.   Clinical Impressions Henry Munoz was seen for OT evaluation this date. Prior to hospital admission, pt was IND for household distances, endorses 6+ falls in last week. Pt lives with brother who is disabled, pt and aid (2.5 hours daily) assist per pt report. Pt presents to acute OT demonstrating impaired ADL performance and functional mobility 2/2 decreased activity tolerance and functional strength/ROM/balance deficits. Pt currently requires CGA sup<>sit, fair static sitting balance/tolerance, poor dynamic balance with posterior lean.   Requires MOD A + HHA for x2 sit<>stand from stretcher, MIN A + RW x3 STS from stretcher; +2 safety. MOD A x2 lateral steps and forward/backward steps ~3 ft. Pt demonstrates decreased safety awareness with all balance and mobility efforts. Pt would benefit from skilled OT to address noted impairments and functional limitations (see below for any additional details). Upon hospital discharge, recommend OT follow up <3 hours/day.     If plan is discharge home, recommend the following:   A lot of help with walking and/or transfers;A lot of help with bathing/dressing/bathroom;Help with stairs or ramp for entrance     Functional Status Assessment   Patient has had a recent decline in their functional status and demonstrates the ability to make significant improvements in function in a reasonable and predictable amount of time.     Equipment Recommendations   BSC/3in1      Recommendations for Other Services         Precautions/Restrictions   Precautions Precautions: Fall Recall of Precautions/Restrictions: Impaired Restrictions Weight Bearing Restrictions Per Provider Order: No     Mobility Bed Mobility Overal bed mobility: Needs Assistance Bed Mobility: Supine to Sit, Sit to Supine     Supine to sit: Contact guard Sit to supine: Contact guard assist        Transfers Overall transfer level: Needs assistance Equipment used: Rolling walker (2 wheels) Transfers: Sit to/from Stand Sit to Stand: Min assist, From elevated surface           General transfer comment: MOD A + HHA for STS from stretcher, MIN A + RW x3 STS from stretcher; +2 safety      Balance Overall balance assessment: Needs assistance Sitting-balance support: Feet supported, Single extremity supported Sitting balance-Leahy Scale: Poor   Postural control: Posterior lean Standing balance support: Bilateral upper extremity supported Standing balance-Leahy Scale: Poor                             ADL either performed or assessed with clinical judgement   ADL Overall ADL's : Needs assistance/impaired                                       General ADL Comments: MOD A + RW x2 simulated BSC t/f. MOD A don gown in sitting      Pertinent Vitals/Pain Pain Assessment Pain Assessment: 0-10 Pain Score: 8  Pain Location: R groin and L  knee Pain Descriptors / Indicators: Grimacing, Guarding, Discomfort Pain Intervention(s): Limited activity within patient's tolerance, Repositioned     Extremity/Trunk Assessment Upper Extremity Assessment Upper Extremity Assessment: Right hand dominant;LUE deficits/detail LUE Deficits / Details: hx CVA, grossly 3-/5   Lower Extremity Assessment Lower Extremity Assessment: Generalized weakness       Communication Communication Communication: No apparent difficulties   Cognition Arousal:  Alert Behavior During Therapy: WFL for tasks assessed/performed Cognition: Cognition impaired             OT - Cognition Comments: poor insight into assistance required and impaired safety awareness                 Following commands: Impaired Following commands impaired: Follows multi-step commands with increased time     Cueing  General Comments   Cueing Techniques: Verbal cues      Exercises     Shoulder Instructions      Home Living Family/patient expects to be discharged to:: Private residence Living Arrangements: Other relatives Available Help at Discharge: Family;Personal care attendant;Available PRN/intermittently Type of Home: House Home Access: Ramped entrance     Home Layout: One level     Bathroom Shower/Tub: Walk-in shower         Home Equipment: Agricultural Consultant (2 wheels);Cane - quad;Shower seat;Grab bars - tub/shower   Additional Comments: pt reports brother is disabled, they have an aid 2.5 hours/day for cooking/cleaning      Prior Functioning/Environment Prior Level of Function : Needs assist;History of Falls (last six months)             Mobility Comments: reports IND household mobility no AD use (reports RW doesn't roll on carpet). Significant falls hx x8 in 2 weeks      OT Problem List: Decreased strength;Decreased range of motion;Decreased activity tolerance;Impaired balance (sitting and/or standing)   OT Treatment/Interventions: Self-care/ADL training;Therapeutic exercise;Energy conservation;DME and/or AE instruction;Therapeutic activities;Patient/family education      OT Goals(Current goals can be found in the care plan section)   Acute Rehab OT Goals Patient Stated Goal: to go home OT Goal Formulation: With patient Time For Goal Achievement: 02/09/24 Potential to Achieve Goals: Good ADL Goals Pt Will Perform Grooming: with modified independence;standing Pt Will Perform Lower Body Dressing: with modified  independence;sit to/from stand Pt Will Transfer to Toilet: with modified independence;ambulating;regular height toilet   OT Frequency:  Min 2X/week    Co-evaluation PT/OT/SLP Co-Evaluation/Treatment: Yes Reason for Co-Treatment: For patient/therapist safety;To address functional/ADL transfers PT goals addressed during session: Mobility/safety with mobility OT goals addressed during session: ADL's and self-care      AM-PAC OT 6 Clicks Daily Activity     Outcome Measure Help from another person eating meals?: None Help from another person taking care of personal grooming?: A Little Help from another person toileting, which includes using toliet, bedpan, or urinal?: A Lot Help from another person bathing (including washing, rinsing, drying)?: A Lot Help from another person to put on and taking off regular upper body clothing?: A Lot Help from another person to put on and taking off regular lower body clothing?: A Lot 6 Click Score: 15   End of Session Equipment Utilized During Treatment: Rolling walker (2 wheels);Gait belt  Activity Tolerance: Patient tolerated treatment well Patient left: in bed;with call bell/phone within reach;with nursing/sitter in room  OT Visit Diagnosis: Other abnormalities of gait and mobility (R26.89);Muscle weakness (generalized) (M62.81)  Time: 9086-9064 OT Time Calculation (min): 22 min Charges:  OT General Charges $OT Visit: 1 Visit OT Evaluation $OT Eval Low Complexity: 1 Low OT Treatments $Self Care/Home Management : 8-22 mins  Elston Slot, M.S. OTR/L  01/26/2024, 10:03 AM  ascom [phone]

## 2024-01-26 NOTE — ED Provider Notes (Signed)
 [facility] Provider Note    Event Date/Time   First MD Initiated Contact with Patient 01/25/24 2354     (approximate)   History   Fall   HPI  Jamyson Jirak is a 58 y.o. male with history of polysubstance use disorder, schizophrenia, diastolic heart failure, hypertension, diabetes, PE status post thrombectomy on Eliquis , stroke with left-sided weakness who presents to the emergency department after multiple falls.  States his left side gives out on him.  Reports he has a walker at home but does not use it because it is no good.  Reports head injury and headache after his fall.  No neck or back pain.  No new numbness or weakness.  Denies any chest pain or shortness of breath.  No fevers, cough, vomiting or diarrhea.   History provided by patient.    Past Medical History:  Diagnosis Date   Alcohol abuse    Cocaine abuse (HCC)    Diabetes mellitus without complication (HCC)    Diastolic dysfunction    a. 06/2022 Echo (in setting of acute PE):  EF 50-55%, no rwma, mod-sev LVH, grI DD, mod reduced RV fxn, triv MR w/ MVP of post leaflet, ? bicuspid AoV w/ triv AI and Ao sclerosis.   Hypertension    Pulmonary embolism (HCC)    a. 06/2022 CTA: Large volume PE affecting all lobar braches w/ R heart strain-->s/p mech thrombectomy.   Schizo-affective schizophrenia (HCC)    Stroke [facility])    TIA (transient ischemic attack)    Tobacco abuse     Past Surgical History:  Procedure Laterality Date   PULMONARY THROMBECTOMY Bilateral 06/30/2022   Procedure: PULMONARY THROMBECTOMY;  Surgeon: Jama Cordella MATSU, MD;  Location: [facility] INVASIVE CV LAB;  Service: Cardiovascular;  Laterality: Bilateral;   PULMONARY THROMBECTOMY Bilateral 01/24/2024   Procedure: PULMONARY THROMBECTOMY;  Surgeon: Jama Cordella MATSU, MD;  Location: [facility] INVASIVE CV LAB;  Service: Cardiovascular;  Laterality: Bilateral;    MEDICATIONS:  Prior to Admission medications  Medication Sig Start  Date End Date Taking? Authorizing Provider  albuterol  (VENTOLIN  HFA) 108 (90 Base) MCG/ACT inhaler Inhale 2-4 puffs into the lungs every 4 (four) hours as needed for wheezing or shortness of breath. 01/27/23   Suzanne Kirsch, MD  amLODipine  (NORVASC ) 10 MG tablet Take 1 tablet (10 mg total) by mouth daily. 07/21/22 01/23/24  Massengill, Rankin, MD  apixaban  (ELIQUIS ) 5 MG TABS tablet Take 1 tablet (5 mg total) by mouth 2 (two) times daily. 02/25/24 04/25/24  Dezii, Alexandra, DO  APIXABAN  (ELIQUIS ) VTE STARTER PACK (10MG  AND 5MG ) Take as directed on package: start with two-5mg  tablets twice daily for 7 days. On day 8, switch to one-5mg  tablet twice daily. 01/25/24   Dezii, Alexandra, DO  ARIPiprazole  (ABILIFY ) 15 MG tablet Take 1 tablet (15 mg total) by mouth daily. 07/21/22 08/20/22  Massengill, Rankin, MD  atorvastatin  (LIPITOR) 40 MG tablet Take 1 tablet (40 mg total) by mouth daily. 01/25/24 04/24/24  Dezii, Alexandra, DO  Blood Glucose Monitoring Suppl DEVI 1 each by Does not apply route as directed. Dispense based on patient and insurance preference. Use up to four times daily as directed. (FOR ICD-10 E10.9, E11.9). 01/25/24   Dezii, Alexandra, DO  Fluticasone-Umeclidin-Vilant (TRELEGY ELLIPTA ) 100-62.5-25 MCG/ACT AEPB Inhale 1 application  into the lungs daily. Patient not taking: Reported on 01/23/2024 02/26/23   Awanda City, MD  gabapentin  (NEURONTIN ) 600 MG tablet Take 600 mg by mouth 3 (three) times daily. 11/18/22  [provider]  Glucagon  (BAQSIMI  TWO PACK) 3 MG/DOSE POWD Place 3 mg into the nose as needed for up to 2 doses (Severe low blood sugar). Give 3 mg (one actuation) into a single nostril. 01/25/24   Dezii, Alexandra, DO  Glucose Blood (BLOOD GLUCOSE TEST STRIPS) STRP 1 each by Does not apply route 3 (three) times daily. Use as directed to check blood sugar. May dispense any manufacturer covered by patient's insurance and fits patient's device. 02/26/23   Awanda City, MD  Glucose  Blood (BLOOD GLUCOSE TEST STRIPS) STRP 1 each by Does not apply route as directed. Dispense based on patient and insurance preference. Use up to four times daily as directed. (FOR ICD-10 E10.9, E11.9). 01/25/24   Dezii, Alexandra, DO  insulin  glargine (LANTUS ) 100 UNIT/ML Solostar Pen Inject 10 Units into the skin daily. May substitute as needed per insurance. 01/25/24   Dezii, Alexandra, DO  insulin  lispro (HUMALOG ) 100 UNIT/ML KwikPen Inject 0-6 Units into the skin 3 (three) times daily with meals. Check Blood Glucose (BG) and inject per scale: BG <150= 0 unit; BG 150-200= 1 unit; BG 201-250= 2 unit; BG 251-300= 3 unit; BG 301-350= 4 unit; BG 351-400= 5 unit; BG >400= 6 unit and Call Primary Care. 01/25/24   Dezii, Alexandra, DO  Insulin  Pen Needle (PEN NEEDLES) 31G X 5 MM MISC 1 each by Does not apply route as directed. Dispense based on patient and insurance preference. Use up to four times daily as directed. (FOR ICD-10 E10.9, E11.9). 01/25/24   Dezii, Alexandra, DO  Lancet Device MISC 1 each by Does not apply route as directed. Dispense based on patient and insurance preference. Use up to four times daily as directed. (FOR ICD-10 E10.9, E11.9). 01/25/24   Dezii, Alexandra, DO  Lancets MISC 1 each by Does not apply route as directed. Dispense based on patient and insurance preference. Use up to four times daily as directed. (FOR ICD-10 E10.9, E11.9). 01/25/24   Leesa Kast, DO    Physical Exam   Triage Vital Signs: ED Triage Vitals  Encounter Vitals Group     BP 01/25/24 2026 (!) 154/95     Girls Systolic BP Percentile --      Girls Diastolic BP Percentile --      Boys Systolic BP Percentile --      Boys Diastolic BP Percentile --      Pulse Rate 01/25/24 2026 86     Resp 01/25/24 2026 18     Temp 01/25/24 2026 98.1 F (36.7 C)     Temp Source 01/25/24 2026 Oral     SpO2 01/25/24 2026 95 %     Weight --      Height --      Head Circumference --      Peak Flow --      Pain Score  01/25/24 2027 8     Pain Loc --      Pain Education --      Exclude from Growth Chart --     Most recent vital signs: Vitals:   01/25/24 2026 01/26/24 0226  BP: (!) 154/95 108/79  Pulse: 86 82  Resp: 18 18  Temp: 98.1 F (36.7 C)   SpO2: 95% 97%    CONSTITUTIONAL: Alert, responds appropriately to questions.  Chronically ill-appearing, obese, oriented x 3 but has difficult time answering some questions HEAD: Normocephalic, atraumatic EYES: Conjunctivae clear, pupils appear equal, sclera nonicteric ENT: normal nose; moist mucous membranes NECK: Supple,  normal ROM, no midline spinal tenderness or step-off or deformity CARD: RRR; S1 and S2 appreciated RESP: Normal chest excursion without splinting or tachypnea; breath sounds clear and equal bilaterally; no wheezes, no rhonchi, no rales, no hypoxia or respiratory distress, speaking full sentences ABD/GI: Non-distended; soft, non-tender, no rebound, no guarding, no peritoneal signs BACK: The back appears normal, no midline spinal tenderness or step-off or deformity EXT: Patient has notable swelling in both lower extremities worse in the left leg than the right without calf tenderness.  Extremities are slightly cool to touch but I am able to Doppler 2+ DP pulse in both legs.  Compartments soft.  No bony tenderness, deformity, joint effusion. SKIN: Normal color for age and race; warm; no rash on exposed skin NEURO: Left-sided weakness which is chronic, normal speech, no facial asymmetry PSYCH: The patient's mood and manner are appropriate.   ED Results / Procedures / Treatments   LABS: (all labs ordered are listed, but only abnormal results are displayed) Labs Reviewed  COMPREHENSIVE METABOLIC PANEL WITH GFR - Abnormal; Notable for the following components:      Result Value   Glucose, Bld 210 (*)    AST 14 (*)    All other components within normal limits  URINALYSIS, ROUTINE W REFLEX MICROSCOPIC - Abnormal; Notable for the  following components:   Color, Urine STRAW (*)    APPearance CLEAR (*)    Glucose, UA >=500 (*)    Leukocytes,Ua TRACE (*)    Bacteria, UA FEW (*)    All other components within normal limits  PRO BRAIN NATRIURETIC PEPTIDE - Abnormal; Notable for the following components:   Pro Brain Natriuretic Peptide 494.0 (*)    All other components within normal limits  TROPONIN T, HIGH SENSITIVITY - Abnormal; Notable for the following components:   Troponin T High Sensitivity 33 (*)    All other components within normal limits  TROPONIN T, HIGH SENSITIVITY - Abnormal; Notable for the following components:   Troponin T High Sensitivity 33 (*)    All other components within normal limits  URINE CULTURE  CBC     EKG:  EKG Interpretation Date/Time:  Wednesday January 25 2024 20:26:44 EST Ventricular Rate:  85 PR Interval:  142 QRS Duration:  94 QT Interval:  446 QTC Calculation: 530 R Axis:   -67  Text Interpretation: Normal sinus rhythm Left anterior fascicular block Possible Anterior infarct , age undetermined T wave abnormality, consider lateral ischemia Abnormal ECG When compared with ECG of 23-Jan-2024 06:54, PREVIOUS ECG IS PRESENT Confirmed by Neomi Neptune [phone]) on 01/26/2024 12:16:01 AM         RADIOLOGY: My personal review and interpretation of imaging: Chest x-ray clear.  Doppler showed no DVT.  CT head shows no intracranial hemorrhage or acute stroke.  I have personally reviewed all radiology reports.   DG Chest Portable 1 View Result Date: 01/26/2024 EXAM: 1 VIEW(S) XRAY OF THE CHEST 01/26/2024 02:11:38 AM COMPARISON: 02/22/2023 CLINICAL HISTORY: eval for pulm edema FINDINGS: LUNGS AND PLEURA: No focal pulmonary opacity. No pleural effusion. No pneumothorax. HEART AND MEDIASTINUM: No acute abnormality of the cardiac and mediastinal silhouettes. BONES AND SOFT TISSUES: No acute osseous abnormality. IMPRESSION: 1. No acute process. Electronically signed by: Dorethia Molt MD  01/26/2024 02:19 AM EST RP Workstation: [id number]   US  Venous Img Lower Bilateral Result Date: 01/26/2024 EXAM: ULTRASOUND DUPLEX OF THE BILATERAL LOWER EXTREMITY VEINS TECHNIQUE: Duplex ultrasound using B-mode/gray scaled imaging and Doppler spectral analysis and color flow  was obtained of the deep venous structures of the bilateral lower extremity. COMPARISON: None available. CLINICAL HISTORY: Leg swelling, worse in the left than the right with negative ultrasound 3 days ago but just had pulmonary thrombectomy, leg swelling and pain has caused him to fall twice today. FINDINGS: The common femoral vein, femoral vein, popliteal vein, and posterior tibial vein demonstrate normal compressibility with normal color flow and spectral analysis. IMPRESSION: 1. No evidence of DVT. Electronically signed by: Oneil Devonshire MD 01/26/2024 01:56 AM EST RP Workstation: [id number]   CT HEAD WO CONTRAST Result Date: 01/25/2024 EXAM: CT HEAD WITHOUT CONTRAST 01/25/2024 10:05:11 PM TECHNIQUE: CT of the head was performed without the administration of intravenous contrast. Automated exposure control, iterative reconstruction, and/or weight based adjustment of the mA/kV was utilized to reduce the radiation dose to as low as reasonably achievable. COMPARISON: 01/22/2024 CLINICAL HISTORY: fall FINDINGS: BRAIN AND VENTRICLES: No acute hemorrhage. Chronic right MCA territory infarct with ex vacuo dilatation of right lateral ventricle. Background of severe chronic microvascular ischemic change, unchanged. No evidence of acute infarct. No hydrocephalus. No extra-axial collection. No mass effect or midline shift. ORBITS: No acute abnormality. SINUSES: No acute abnormality. SOFT TISSUES AND SKULL: No acute soft tissue abnormality. No skull fracture. IMPRESSION: 1. No acute intracranial abnormality. 2. Chronic right MCA territory infarct with ex vacuo dilatation of the right lateral ventricle. 3. Severe chronic microvascular ischemic  change, unchanged. Electronically signed by: Dorethia Molt MD 01/25/2024 10:11 PM EST RP Workstation: [id number]     PROCEDURES:  Critical Care performed: No    Procedures    IMPRESSION / MDM / ASSESSMENT AND PLAN / ED COURSE  I reviewed the triage vital signs and the nursing notes.    Patient here with likely mechanical fall from left-sided weakness from previous stroke.  Also has swelling in both legs worse on the left than the right.  The patient is on the cardiac monitor to evaluate for evidence of arrhythmia and/or significant heart rate changes.   DIFFERENTIAL DIAGNOSIS (includes but not limited to):   DVT, CHF exacerbation, sequela of previous stroke, UTI, intracranial hemorrhage, anemia, electrolyte derangement   Patient's presentation is most consistent with acute presentation with potential threat to life or bodily function.   PLAN: Workup initiated from triage.  Normal hemoglobin, electrolytes.  Will check BNP, troponin.  EKG nonischemic.  CT head reviewed and interpreted by myself and radiologist and shows no acute abnormality.  Will obtain venous Dopplers of both of his lower extremities.  He did have a recent negative Doppler of his left leg but is not sure if the swelling is worse today and given he recently had a pulmonary thrombectomy and admission to the hospital, would like to ensure that he does not have a new clot since starting Eliquis .   MEDICATIONS GIVEN IN ED: Medications - No data to display   ED COURSE: BNP is only 494.  Troponin minimally elevated at 33.  Will check second troponin.  2:20 AM  Repeat troponin is flat at 33.  Venous Dopplers reviewed and interpreted by myself and the radiologist and showed no DVT.  Chest x-ray clear.  Urine does show some pyuria and bacteria but does not appear significantly infected.  I do not think this is enough to say that this is the cause of his falls today but will add on urine culture.  Unfortunately at  this time there is no indication for medical admission but I do not feel patient is safe to  be discharged home given frequent falls likely from chronic left-sided weakness and feel he may benefit from [facility] placement.  He is in agreement with this plan.   CONSULTS:  none   OUTSIDE RECORDS REVIEWED: Reviewed recent hospital notes.       FINAL CLINICAL IMPRESSION(S) / ED DIAGNOSES   Final diagnoses:  Frequent falls  Leg swelling  Injury of head, initial encounter     Rx / DC Orders   ED Discharge Orders     None        Note:  This document was prepared using Dragon voice recognition software and may include unintentional dictation errors.   Gabrille Kilbride, Josette SAILOR, DO 01/26/24 [phone]

## 2024-01-26 NOTE — ED Notes (Signed)
 Pt attempting to get in touch with someone to take him home

## 2024-01-26 NOTE — ED Notes (Signed)
 Food tray provided.

## 2024-01-27 LAB — CBG MONITORING, ED
Glucose-Capillary: 129 mg/dL — ABNORMAL HIGH (ref 70–99)
Glucose-Capillary: 243 mg/dL — ABNORMAL HIGH (ref 70–99)
Glucose-Capillary: 199 mg/dL — ABNORMAL HIGH (ref 70–99)
Glucose-Capillary: 180 mg/dL — ABNORMAL HIGH (ref 70–99)

## 2024-01-27 LAB — URINE CULTURE: Culture: NO GROWTH

## 2024-01-27 NOTE — TOC Progression Note (Signed)
 Transition of Care [facility]) - Progression Note    Patient Details  Name: Mamoudou Mulvehill MRN: [id number] Date of Birth: 29-May-1965  Transition of Care [facility]) CM/SW Contact  Shenaya Lebo L Tamitha Norell, [place] Phone Number: 01/27/2024, 4:15 PM  Clinical Narrative:     Adult protective services report made with the [facility] Department of Social Services.                     Expected Discharge Plan and Services                                               Social Drivers of Health (SDOH) Interventions SDOH Screenings   Food Insecurity: No Food Insecurity (01/24/2024)  Housing: Low Risk (01/24/2024)  Transportation Needs: No Transportation Needs (01/24/2024)  Utilities: Not At Risk (01/24/2024)  Alcohol Screen: Low Risk (07/12/2022)  Tobacco Use: High Risk (01/25/2024)    Readmission Risk Interventions    01/25/2024    1:22 PM 02/23/2023    3:36 PM  Readmission Risk Prevention Plan  Transportation Screening Complete Complete  Medication Review (RN Care Manager) Complete Complete  PCP or Specialist appointment within 3-5 days of discharge Complete Complete  HRI or Home Care Consult Complete   SW Recovery Care/Counseling Consult Complete Complete  Palliative Care Screening Not Applicable Not Applicable  Skilled Nursing Facility Not Applicable Not Applicable

## 2024-01-27 NOTE — ED Notes (Signed)
 Urine emptied. Pericare provided. New brief applied and warm blankets given. Pt tolerated well.

## 2024-01-27 NOTE — ED Notes (Signed)
 This RN and [place]on, NT cleaned pt after having an incontinent episode of urine. New linen and brief applied. Pt instructed to use urinal. Pt agreeable.

## 2024-01-27 NOTE — ED Notes (Signed)
Pt given phone to speak with brother at this time.

## 2024-01-27 NOTE — TOC Progression Note (Signed)
 Transition of Care [facility]) - Progression Note    Patient Details  Name: Henry Munoz MRN: [id number] Date of Birth: 04/12/1965  [facility]) CM/SW Contact  Aditya Nastasi L Lachlyn Vanderstelt, [place] Phone Number: 01/27/2024, 8:14 AM  Clinical Narrative:     CSW met with patient. CSW reviewed the only bed offer received. CSW advised that it is uncertain if Medicaid will pay for him to go to rehab especially considering that he is in the ED for a non-medical emergency.   CSW also advised patient that he does not qualify for IP rehab. CSW advised that [facility] declined the referral. CSW advised that [facility] denied, advising that patient does not have a diagnosis to meet the criteria for IP rehab.   Patient declined SNF. CSW advised patient that the only option is for him to discharge home. He was agreeable to this.   Medical team will be notified.                    Expected Discharge Plan and Services                                               Social Drivers of Health (SDOH) Interventions SDOH Screenings   Food Insecurity: No Food Insecurity (01/24/2024)  Housing: Low Risk (01/24/2024)  Transportation Needs: No Transportation Needs (01/24/2024)  Utilities: Not At Risk (01/24/2024)  Alcohol Screen: Low Risk (07/12/2022)  Tobacco Use: High Risk (01/25/2024)    Readmission Risk Interventions    01/25/2024    1:22 PM 02/23/2023    3:36 PM  Readmission Risk Prevention Plan  Transportation Screening Complete Complete  Medication Review (RN Care Manager) Complete Complete  PCP or Specialist appointment within 3-5 days of discharge Complete Complete  HRI or Home Care Consult Complete   SW Recovery Care/Counseling Consult Complete Complete  Palliative Care Screening Not Applicable Not Applicable  Skilled Nursing Facility Not Applicable Not Applicable

## 2024-01-27 NOTE — ED Provider Notes (Signed)
 Emergency Medicine Observation Re-evaluation Note   BP (!) 138/96   Pulse 75   Temp 98.3 F (36.8 C) (Oral)   Resp 19   SpO2 99%    ED Course / MDM   No reported events during my shift at the time of this note.   Pt is awaiting dispo from consultants   Ginnie Shams MD    Shams Ginnie, MD 01/27/24 [phone]

## 2024-01-27 NOTE — ED Notes (Signed)
 Pt set up for lunch

## 2024-01-27 NOTE — Progress Notes (Signed)
 Physical Therapy Treatment Patient Details Name: Henry Munoz MRN: [id number] DOB: 23-Jul-1965 Today's Date: 01/27/2024   History of Present Illness Henry Munoz is a 58 y.o. male who presented to ED on 01/25/24 with c/o of multiple falls. PMH significant of PE nonadherent with Eliquis , HTN, chronic HFpEF, cocaine abuse, COPD, IIDM, schizoaffective disorder, CVA presented to ED with worsening L shoulder pain and frequent falls. Pt found to have PE on recent 12/15 admission and underwent thrombectomy on 01/24/24.    PT Comments  Patient seen for PT session focused on standing tolerance. Patient required modA to stand at bedside form prolonged periods used RW. Main limiting factors today were left sided weakness. Interventions aimed at improving out of bed mobility. Patient shows poor potential to make progress with continued acute level rehab. Patient continues to demonstrate moderate activity restrictions and poor tolerance for progressive mobility. Continued skilled PT recommended to progress toward functional goals and support discharge readiness. Pt making good progress toward goals, will continue to follow POC. Discharge recommendation remains appropriate     If plan is discharge home, recommend the following: Two people to help with walking and/or transfers;Two people to help with bathing/dressing/bathroom;Assist for transportation;Help with stairs or ramp for entrance   Can travel by private vehicle     No  Equipment Recommendations  Other (comment) (TBD)    Recommendations for Other Services       Precautions / Restrictions Precautions Precautions: Fall Recall of Precautions/Restrictions: Impaired Restrictions Weight Bearing Restrictions Per Provider Order: No     Mobility  Bed Mobility Overal bed mobility: Needs Assistance Bed Mobility: Supine to Sit, Sit to Supine     Supine to sit: Contact guard Sit to supine: Contact guard assist         Transfers Overall transfer level: Needs assistance Equipment used: Rolling walker (2 wheels) Transfers: Sit to/from Stand Sit to Stand: Min assist, From elevated surface, Contact guard assist           General transfer comment: sit to stand from stretcher initially requires sitting balance    Ambulation/Gait Ambulation/Gait assistance: Min assist, +2 physical assistance Gait Distance (Feet): 5 Feet   Gait Pattern/deviations: Narrow base of support, Decreased stride length, Step-to pattern, Decreased step length - left Gait velocity: decreased     General Gait Details: significant difficulty advancing LLE with forward/backward and side steps; able to take more steps when motivated   Optometrist     Tilt Bed    Modified Rankin (Stroke Patients Only)       Balance Overall balance assessment: Needs assistance Sitting-balance support: Feet supported, Single extremity supported Sitting balance-Leahy Scale: Poor   Postural control: Posterior lean Standing balance support: Bilateral upper extremity supported Standing balance-Leahy Scale: Poor Standing balance comment: reliant on RW for support                            Communication Communication Communication: No apparent difficulties  Cognition Arousal: Alert Behavior During Therapy: WFL for tasks assessed/performed   PT - Cognitive impairments: No apparent impairments                         Following commands: Impaired Following commands impaired: Follows multi-step commands with increased time    Cueing Cueing Techniques: Verbal cues  Exercises      General Comments  Pertinent Vitals/Pain Pain Assessment Pain Location: R groin and L knee Pain Descriptors / Indicators: Grimacing, Guarding, Discomfort Pain Intervention(s): Limited activity within patient's tolerance, Monitored during session, Repositioned    Home Living                           Prior Function            PT Goals (current goals can now be found in the care plan section) Acute Rehab PT Goals Patient Stated Goal: none stated PT Goal Formulation: With patient Time For Goal Achievement: 02/09/24 Potential to Achieve Goals: Good Progress towards PT goals: Progressing toward goals    Frequency    Min 2X/week      PT Plan      Co-evaluation              AM-PAC PT 6 Clicks Mobility   Outcome Measure  Help needed turning from your back to your side while in a flat bed without using bedrails?: None Help needed moving from lying on your back to sitting on the side of a flat bed without using bedrails?: None Help needed moving to and from a bed to a chair (including a wheelchair)?: A Little Help needed standing up from a chair using your arms (e.g., wheelchair or bedside chair)?: A Little Help needed to walk in hospital room?: A Lot Help needed climbing 3-5 steps with a railing? : A Lot 6 Click Score: 18    End of Session Equipment Utilized During Treatment: Gait belt Activity Tolerance: Patient tolerated treatment well Patient left: in bed Nurse Communication: Mobility status PT Visit Diagnosis: Unsteadiness on feet (R26.81);Other abnormalities of gait and mobility (R26.89);Repeated falls (R29.6);History of falling (Z91.81);Muscle weakness (generalized) (M62.81);Difficulty in walking, not elsewhere classified (R26.2)     Time: 1426-1450 PT Time Calculation (min) (ACUTE ONLY): 24 min  Charges:    $Therapeutic Activity: 8-22 mins PT General Charges $$ ACUTE PT VISIT: 1 Visit                     Henry Lesches DPT, PT    Henry Munoz 01/27/2024, 3:03 PM

## 2024-01-28 LAB — CBG MONITORING, ED
Glucose-Capillary: 133 mg/dL — ABNORMAL HIGH (ref 70–99)
Glucose-Capillary: 206 mg/dL — ABNORMAL HIGH (ref 70–99)
Glucose-Capillary: 259 mg/dL — ABNORMAL HIGH (ref 70–99)
Glucose-Capillary: 216 mg/dL — ABNORMAL HIGH (ref 70–99)

## 2024-01-28 NOTE — ED Notes (Signed)
 Lunch tray provided to pt.

## 2024-01-28 NOTE — ED Provider Notes (Signed)
 "  [facility] Observation Note   ----------------------------------------- 12:38 AM on 01/28/2024 -----------------------------------------  Henry Munoz is a 58 y.o. male currently boarding in the Emergency Department.  No acute events since last update.  Recent Vitals   Most recent vital signs: Vitals:   01/27/24 0834 01/27/24 0925  BP: (!) 153/95 (!) 153/95  Pulse: 73   Resp: 19   Temp: 98.3 F (36.8 C)   SpO2: 100%     ED Results / Procedures / Treatments   Labs (all labs ordered are listed, but only abnormal results are displayed) Labs Reviewed  COMPREHENSIVE METABOLIC PANEL WITH GFR - Abnormal; Notable for the following components:      Result Value   Glucose, Bld 210 (*)    AST 14 (*)    All other components within normal limits  URINALYSIS, ROUTINE W REFLEX MICROSCOPIC - Abnormal; Notable for the following components:   Color, Urine STRAW (*)    APPearance CLEAR (*)    Glucose, UA >=500 (*)    Leukocytes,Ua TRACE (*)    Bacteria, UA FEW (*)    All other components within normal limits  PRO BRAIN NATRIURETIC PEPTIDE - Abnormal; Notable for the following components:   Pro Brain Natriuretic Peptide 494.0 (*)    All other components within normal limits  CBG MONITORING, ED - Abnormal; Notable for the following components:   Glucose-Capillary 154 (*)    All other components within normal limits  CBG MONITORING, ED - Abnormal; Notable for the following components:   Glucose-Capillary 213 (*)    All other components within normal limits  CBG MONITORING, ED - Abnormal; Notable for the following components:   Glucose-Capillary 234 (*)    All other components within normal limits  CBG MONITORING, ED - Abnormal; Notable for the following components:   Glucose-Capillary 147 (*)    All other components within normal limits  CBG MONITORING, ED - Abnormal; Notable for the following components:   Glucose-Capillary 129 (*)    All other  components within normal limits  CBG MONITORING, ED - Abnormal; Notable for the following components:   Glucose-Capillary 243 (*)    All other components within normal limits  CBG MONITORING, ED - Abnormal; Notable for the following components:   Glucose-Capillary 199 (*)    All other components within normal limits  CBG MONITORING, ED - Abnormal; Notable for the following components:   Glucose-Capillary 180 (*)    All other components within normal limits  TROPONIN T, HIGH SENSITIVITY - Abnormal; Notable for the following components:   Troponin T High Sensitivity 33 (*)    All other components within normal limits  TROPONIN T, HIGH SENSITIVITY - Abnormal; Notable for the following components:   Troponin T High Sensitivity 33 (*)    All other components within normal limits  URINE CULTURE  CBC  CBG MONITORING, ED    MEDICATIONS ORDERED IN ED: Medications  apixaban  (ELIQUIS ) tablet 10 mg (10 mg Oral Given 01/27/24 2201)    Followed by  apixaban  (ELIQUIS ) tablet 5 mg (has no administration in time range)  insulin  aspart (novoLOG ) injection 0-15 Units (3 Units Subcutaneous Given 01/27/24 1903)  insulin  aspart (novoLOG ) injection 0-5 Units ( Subcutaneous Not Given 01/27/24 2158)  amLODipine  (NORVASC ) tablet 10 mg (10 mg Oral Given 01/27/24 0925)  atorvastatin  (LIPITOR) tablet 40 mg (40 mg Oral Given 01/27/24 0925)  ARIPiprazole  (ABILIFY ) tablet 15 mg (15 mg Oral Given 01/27/24 0925)  gabapentin  (NEURONTIN ) capsule 600  mg (600 mg Oral Given 01/27/24 2201)  insulin  glargine (LANTUS ) injection 10 Units (10 Units Subcutaneous Given 01/27/24 1010)     ED Plan   Currently awaiting placement into an appropriate living facility.  Social work is working with the patient to help achieve this.      Dorothyann Drivers, MD 01/28/24 0038  "

## 2024-01-28 NOTE — ED Notes (Signed)
 Dinner tray provided to pt

## 2024-01-28 NOTE — ED Notes (Signed)
 Pt spilt coffee on self. Pt cleaned and new gown/blankets provided to pt by EDT Niels.
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
## 2024-01-28 NOTE — ED Provider Notes (Signed)
----------------------------------------- °  7:37 AM on 01/28/2024 -----------------------------------------   Blood pressure (!) 153/95, pulse 73, temperature 98.3 F (36.8 C), temperature source Oral, resp. rate 19, SpO2 100%.  The patient is calm and cooperative at this time.  There have been no acute events since the last update.  Awaiting disposition plan from [facility] team.   Gordan Huxley, MD 01/28/24 [phone]

## 2024-01-29 LAB — CBG MONITORING, ED
Glucose-Capillary: 126 mg/dL — ABNORMAL HIGH (ref 70–99)
Glucose-Capillary: 174 mg/dL — ABNORMAL HIGH (ref 70–99)
Glucose-Capillary: 117 mg/dL — ABNORMAL HIGH (ref 70–99)
Glucose-Capillary: 117 mg/dL — ABNORMAL HIGH (ref 70–99)

## 2024-01-29 NOTE — ED Notes (Signed)
 TOC
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
## 2024-01-29 NOTE — ED Notes (Signed)
 Pt updated on status.
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
## 2024-01-29 NOTE — ED Notes (Signed)
 ED RN took over care after receiving handoff. Pt is under TOC status, awaiting plan for placement or discharge. Pt currently resting in hallway stretcher. Pt in gown and has adult brief on. Pt has eaten dinner and was provided with snack this evening. Pt ABCs intact. RR even and unlabored. Pt in NAD. Bed in lowest locked position.   Past Medical History:  Diagnosis Date   Alcohol abuse    Cocaine abuse (HCC)    Diabetes mellitus without complication (HCC)    Diastolic dysfunction    a. 06/2022 Echo (in setting of acute PE):  EF 50-55%, no rwma, mod-sev LVH, grI DD, mod reduced RV fxn, triv MR w/ MVP of post leaflet, ? bicuspid AoV w/ triv AI and Ao sclerosis.   Hypertension    Pulmonary embolism (HCC)    a. 06/2022 CTA: Large volume PE affecting all lobar braches w/ R heart strain-->s/p mech thrombectomy.   Schizo-affective schizophrenia (HCC)    Stroke [facility])    TIA (transient ischemic attack)    Tobacco abuse

## 2024-01-29 NOTE — ED Notes (Signed)
 Pt on the phone speaking with his brother

## 2024-01-29 NOTE — ED Notes (Signed)
 Pt was asked if he needed his brief changed. Pt states that he is not wet at this time. Pt asking when he can go home. Pt states that he does have power at his house. Pt repositioned in bed. Pt requesting to use the phone to call his brother. Phone provided to pt.

## 2024-01-30 ENCOUNTER — Emergency Department
Admission: EM | Admit: 2024-01-30 | Discharge: 2024-02-01 | Disposition: A | Discharge status: Rehabilitation Facility | Source: Home / Self Care

## 2024-01-30 ENCOUNTER — Emergency Department

## 2024-01-30 DIAGNOSIS — I10 Essential (primary) hypertension: Secondary | ICD-10-CM | POA: Insufficient documentation

## 2024-01-30 DIAGNOSIS — Z59819 Housing instability, housed unspecified: Secondary | ICD-10-CM | POA: Insufficient documentation

## 2024-01-30 DIAGNOSIS — Z7901 Long term (current) use of anticoagulants: Secondary | ICD-10-CM | POA: Insufficient documentation

## 2024-01-30 DIAGNOSIS — Z79899 Other long term (current) drug therapy: Secondary | ICD-10-CM | POA: Insufficient documentation

## 2024-01-30 DIAGNOSIS — S0083XA Contusion of other part of head, initial encounter: Secondary | ICD-10-CM | POA: Insufficient documentation

## 2024-01-30 DIAGNOSIS — R600 Localized edema: Secondary | ICD-10-CM | POA: Insufficient documentation

## 2024-01-30 DIAGNOSIS — R072 Precordial pain: Secondary | ICD-10-CM | POA: Insufficient documentation

## 2024-01-30 DIAGNOSIS — Z638 Other specified problems related to primary support group: Secondary | ICD-10-CM

## 2024-01-30 DIAGNOSIS — Z794 Long term (current) use of insulin: Secondary | ICD-10-CM | POA: Insufficient documentation

## 2024-01-30 DIAGNOSIS — E119 Type 2 diabetes mellitus without complications: Secondary | ICD-10-CM | POA: Insufficient documentation

## 2024-01-30 DIAGNOSIS — I2782 Chronic pulmonary embolism: Secondary | ICD-10-CM

## 2024-01-30 DIAGNOSIS — W19XXXA Unspecified fall, initial encounter: Secondary | ICD-10-CM

## 2024-01-30 LAB — CBG MONITORING, ED
Glucose-Capillary: 152 mg/dL — ABNORMAL HIGH (ref 70–99)
Glucose-Capillary: 187 mg/dL — ABNORMAL HIGH (ref 70–99)
Glucose-Capillary: 94 mg/dL (ref 70–99)

## 2024-01-30 MED ORDER — INSULIN ASPART 100 UNIT/ML IJ SOLN: 0.0000 [IU] | Freq: Every day | INTRAMUSCULAR | Status: DC

## 2024-01-30 MED ORDER — INSULIN GLARGINE-YFGN 100 UNIT/ML ~~LOC~~ SOLN: 10.0000 [IU] | Freq: Every day | SUBCUTANEOUS | Status: DC

## 2024-01-30 MED ORDER — ATORVASTATIN CALCIUM 20 MG PO TABS
40.0000 mg | ORAL_TABLET | Freq: Every day | ORAL | Status: DC
  Administered 2024-01-30 – 2024-02-01 (×3): 40 mg via ORAL
  Filled 2024-01-30 (×3): qty 2

## 2024-01-30 MED ORDER — GABAPENTIN 300 MG PO CAPS
600.0000 mg | ORAL_CAPSULE | Freq: Three times a day (TID) | ORAL | Status: DC
  Administered 2024-01-30 – 2024-02-01 (×5): 600 mg via ORAL
  Filled 2024-01-30 (×5): qty 2

## 2024-01-30 MED ORDER — INSULIN GLARGINE 100 UNIT/ML ~~LOC~~ SOLN
10.0000 [IU] | Freq: Every day | SUBCUTANEOUS | Status: DC
  Administered 2024-01-30: 10 [IU] via SUBCUTANEOUS
  Filled 2024-01-30 (×3): qty 0.1

## 2024-01-30 MED ORDER — APIXABAN 5 MG PO TABS
10.0000 mg | ORAL_TABLET | Freq: Two times a day (BID) | ORAL | Status: AC
  Administered 2024-01-30 – 2024-02-01 (×4): 10 mg via ORAL
  Filled 2024-01-30 (×4): qty 2

## 2024-01-30 MED ORDER — AMLODIPINE BESYLATE 5 MG PO TABS
10.0000 mg | ORAL_TABLET | Freq: Every day | ORAL | Status: DC
  Administered 2024-01-30 – 2024-02-01 (×3): 10 mg via ORAL
  Filled 2024-01-30 (×3): qty 2

## 2024-01-30 MED ORDER — ARIPIPRAZOLE 15 MG PO TABS
15.0000 mg | ORAL_TABLET | Freq: Once | ORAL | Status: AC
  Administered 2024-01-30: 15 mg via ORAL
  Filled 2024-01-30: qty 1

## 2024-01-30 MED ORDER — INSULIN ASPART 100 UNIT/ML IJ SOLN
0.0000 [IU] | Freq: Three times a day (TID) | INTRAMUSCULAR | Status: DC
  Administered 2024-02-01: 2 [IU] via SUBCUTANEOUS
  Filled 2024-01-30: qty 2

## 2024-01-30 MED ORDER — APIXABAN 5 MG PO TABS
5.0000 mg | ORAL_TABLET | Freq: Two times a day (BID) | ORAL | Status: DC
  Administered 2024-02-01: 5 mg via ORAL
  Filled 2024-01-30: qty 1
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
## 2024-01-30 NOTE — TOC Progression Note (Signed)
 Transition of Care [facility]) - Progression Note    Patient Details  Name: Damarea Merkel MRN: [id number] Date of Birth: 11/23/1965  Transition of Care [facility]) CM/SW Contact  Nathanael CHRISTELLA Ring, RN Phone Number: 01/30/2024, 10:12 AM  Clinical Narrative:    Called [facility] this morning to arrange EMS transportation for patient to get him home.  Verified with  him at the bedside that his address is correct on file as is his cell number but he does not have his phone with him he forgot it at the house.                       Expected Discharge Plan and Services                                               Social Drivers of Health (SDOH) Interventions SDOH Screenings   Food Insecurity: No Food Insecurity (01/24/2024)  Housing: Low Risk (01/24/2024)  Transportation Needs: No Transportation Needs (01/24/2024)  Utilities: Not At Risk (01/24/2024)  Alcohol Screen: Low Risk (07/12/2022)  Tobacco Use: High Risk (01/25/2024)    Readmission Risk Interventions    01/25/2024    1:22 PM 02/23/2023    3:36 PM  Readmission Risk Prevention Plan  Transportation Screening Complete Complete  Medication Review (RN Care Manager) Complete Complete  PCP or Specialist appointment within 3-5 days of discharge Complete Complete  HRI or Home Care Consult Complete   SW Recovery Care/Counseling Consult Complete Complete  Palliative Care Screening Not Applicable Not Applicable  Skilled Nursing Facility Not Applicable Not Applicable
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
## 2024-01-30 NOTE — ED Notes (Signed)
 Brother called for update on pt, given plan of care via phone at this time.

## 2024-01-30 NOTE — ED Provider Notes (Addendum)
 BP (!) 144/79 (BP Location: Left Arm)   Pulse 77   Temp 98.7 F (37.1 C) (Oral)   Resp 16   SpO2 95%   Patient is resting in bed.  No acute distress.  No acute events since last update.  Disposition pending TOC/social work.   Update: Social work recommending discharge home with patient's brother.  I spoke with patient who is amenable to this plan.  TOC arranged transport.  Will proceed with discharge home per social work recommendation.    Clarine Ozell LABOR, MD 01/30/24 [phone]

## 2024-01-30 NOTE — ED Notes (Signed)
 Pt reports to RN that he wants to go home. Pt reports that he will be able to care for himself at home. Pt oriented at this time to month, year, place, situation, age, name, birthday at this time.

## 2024-01-30 NOTE — ED Notes (Signed)
 Pt given snack of crackers and peanut butter.

## 2024-01-30 NOTE — ED Triage Notes (Signed)
 Pt reports hitting his head on table when he fell. Pt axox4 at this time. Pt denies pain.

## 2024-01-31 LAB — CBG MONITORING, ED
Glucose-Capillary: 105 mg/dL — ABNORMAL HIGH (ref 70–99)
Glucose-Capillary: 107 mg/dL — ABNORMAL HIGH (ref 70–99)
Glucose-Capillary: 100 mg/dL — ABNORMAL HIGH (ref 70–99)
Glucose-Capillary: 140 mg/dL — ABNORMAL HIGH (ref 70–99)

## 2024-01-31 MED ORDER — LORAZEPAM 1 MG PO TABS
1.0000 mg | ORAL_TABLET | Freq: Once | ORAL | Status: AC
  Administered 2024-01-31: 1 mg via ORAL
  Filled 2024-01-31: qty 1

## 2024-01-31 MED ORDER — MELATONIN 5 MG PO TABS
5.0000 mg | ORAL_TABLET | Freq: Every day | ORAL | Status: DC
  Administered 2024-01-31 (×2): 5 mg via ORAL
  Filled 2024-01-31 (×2): qty 1

## 2024-01-31 MED ORDER — ONDANSETRON 4 MG PO TBDP
4.0000 mg | ORAL_TABLET | Freq: Once | ORAL | Status: AC
  Administered 2024-01-31: 4 mg via ORAL
  Filled 2024-01-31: qty 1

## 2024-01-31 MED ORDER — NICOTINE 21 MG/24HR TD PT24
21.0000 mg | MEDICATED_PATCH | Freq: Once | TRANSDERMAL | Status: AC
  Administered 2024-01-31: 21 mg via TRANSDERMAL
  Filled 2024-01-31: qty 1

## 2024-01-31 NOTE — Evaluation (Signed)
 Physical Therapy Evaluation Patient Details Name: Henry Munoz MRN: [id number] DOB: [birth year] Today's Date: 01/31/2024  History of Present Illness  Patient is a 58 year old male who was just discharged from the hospital presenting after fall at home.  PMH: substance abuse, schizoaffective disorder, hypertension, diabetes, CVA with left-sided deficits, PEs on Eliquis .  Clinical Impression  Patient just seen during recent admission with SNF recommended. He reports he discharged home and then fell. He was requiring assistance for standing during the last hospital stay.  Today the patient required significant assistance with all mobility. +2 person assistance required for repositioning in bed, to sit up on edge of bed. Sitting balance is poor. +2 person assistance required for standing with poor standing tolerance as well. Unable to safely progress walking today. Rehabilitation < 3 hours/day recommended again after this hospital stay.       If plan is discharge home, recommend the following: Two people to help with walking and/or transfers;Two people to help with bathing/dressing/bathroom;Assistance with cooking/housework;Direct supervision/assist for medications management;Direct supervision/assist for financial management;Assist for transportation;Help with stairs or ramp for entrance   Can travel by private vehicle   No    Equipment Recommendations  (TBD at next level of care)  Recommendations for Other Services       Functional Status Assessment Patient has had a recent decline in their functional status and demonstrates the ability to make significant improvements in function in a reasonable and predictable amount of time.     Precautions / Restrictions Precautions Precautions: Fall Recall of Precautions/Restrictions: Impaired Restrictions Weight Bearing Restrictions Per Provider Order: No      Mobility  Bed Mobility Overal bed mobility: Needs Assistance Bed Mobility:  Supine to Sit, Sit to Supine, Rolling Rolling: Max assist   Supine to sit: Max assist, +2 for physical assistance Sit to supine: Max assist, +2 for physical assistance   General bed mobility comments: cues for initiation and technique. assistance for trunk and BLE support. difficulty with all mobility due to generalized weakness    Transfers Overall transfer level: Needs assistance Equipment used: 2 person hand held assist Transfers: Sit to/from Stand Sit to Stand: Mod assist, +2 physical assistance           General transfer comment: 2 person assistance requried for standing.    Ambulation/Gait               General Gait Details: not attmepted due to poor standing tolerance and generalized weakness  Stairs            Wheelchair Mobility     Tilt Bed    Modified Rankin (Stroke Patients Only)       Balance Overall balance assessment: Needs assistance Sitting-balance support: Feet supported Sitting balance-Leahy Scale: Poor   Postural control: Posterior lean Standing balance support: Bilateral upper extremity supported Standing balance-Leahy Scale: Zero Standing balance comment: +2 person assistance required to maintain standing balance with heavy reliance on the stretcher for posterior leg support                             Pertinent Vitals/Pain Pain Assessment Pain Assessment: No/denies pain    Home Living Family/patient expects to be discharged to:: Private residence Living Arrangements: Other relatives Available Help at Discharge: Family;Personal care attendant;Available PRN/intermittently Type of Home: House Home Access: Ramped entrance       Home Layout: One level Home Equipment: Agricultural Consultant (2 wheels);Cane - quad;Shower  seat;Grab bars - tub/shower Additional Comments: pt reports brother is disabled, they have an aid 2.5 hours/day for cooking/cleaning (information is per prior admission)    Prior Function Prior Level of  Function : Needs assist;History of Falls (last six months)             Mobility Comments: reports IND household mobility no AD use (reports RW doesn't roll on carpet). Significant falls hx x8 in 2 weeks       Extremity/Trunk Assessment   Upper Extremity Assessment Upper Extremity Assessment: Generalized weakness;LUE deficits/detail LUE Deficits / Details: residual weakness from prior stroke    Lower Extremity Assessment Lower Extremity Assessment: Generalized weakness       Communication   Communication Communication: No apparent difficulties    Cognition Arousal: Alert Behavior During Therapy: WFL for tasks assessed/performed   PT - Cognitive impairments: Safety/Judgement, Awareness                       PT - Cognition Comments: patient asking about his cigarettes Following commands: Impaired       Cueing Cueing Techniques: Verbal cues, Tactile cues, Visual cues     General Comments      Exercises     Assessment/Plan    PT Assessment Patient needs continued PT services  PT Problem List Decreased strength;Decreased range of motion;Decreased activity tolerance;Decreased balance;Decreased mobility       PT Treatment Interventions DME instruction;Gait training;Functional mobility training;Balance training;Therapeutic exercise;Therapeutic activities;Neuromuscular re-education;Cognitive remediation;Patient/family education;Wheelchair mobility training    PT Goals (Current goals can be found in the Care Plan section)  Acute Rehab PT Goals Patient Stated Goal: none stated PT Goal Formulation: With patient Time For Goal Achievement: 02/14/24 Potential to Achieve Goals: Poor    Frequency Min 1X/week     Co-evaluation               AM-PAC PT 6 Clicks Mobility  Outcome Measure Help needed turning from your back to your side while in a flat bed without using bedrails?: A Lot Help needed moving from lying on your back to sitting on the side  of a flat bed without using bedrails?: Total Help needed moving to and from a bed to a chair (including a wheelchair)?: Total Help needed standing up from a chair using your arms (e.g., wheelchair or bedside chair)?: Total Help needed to walk in hospital room?: Total Help needed climbing 3-5 steps with a railing? : Total 6 Click Score: 7    End of Session   Activity Tolerance: Patient tolerated treatment well Patient left: in bed;with call bell/phone within reach Nurse Communication: Mobility status (nurse present for mobility and adjusted the mattress on the stretcher while the patient was standing) PT Visit Diagnosis: Muscle weakness (generalized) (M62.81);Unsteadiness on feet (R26.81)    Time: 9093-9069 PT Time Calculation (min) (ACUTE ONLY): 24 min   Charges:   PT Evaluation $PT Eval Moderate Complexity: 1 Mod PT Treatments $Therapeutic Activity: 8-22 mins PT General Charges $$ ACUTE PT VISIT: 1 Visit        Randine Essex, PT, MPT  Randine LULLA Essex 01/31/2024, 9:54 AM

## 2024-01-31 NOTE — TOC Progression Note (Signed)
 Transition of Care [facility]) - Progression Note    Patient Details  Name: Henry Munoz MRN: [id number] Date of Birth: [birth year]  Transition of Care [facility] CM/SW Contact  Yulissa Needham L Malasha [place], [place] Phone Number: 01/31/2024, 8:48 AM  Clinical Narrative:      CSW met with patient in the ED to discuss several concerns. CSW advised that patient nor his brother are being honest regarding the circumstances at home. Per RNCM DOROTHA Ring, who covered the ED yesterday, DSS is advising of ongoing concerns. There is no electricity, no generator or heat source in the home. DSS is asking that patient does not discharge however, they do not have a protective order preventing patient from discharging from the ED.   Patient advised that he would like to return home. CSW advised patient that home does not seem to be an appropriate discharge plan and encouraged him to make better decisions for himself. Patient then advised that he would like to go to the nursing facility. CSW contacted [facility] to restart insurance auth.   CSW then had a discussion with patient regarding his behaviors in the ED. CSW advised that it is inappropriate and unsafe for smoking to take place inside the building where we have chemicals, gas and oxygen. Patient advised that he was unaware. CSW directly and firmly advised patient that he is being manipulative.                      Expected Discharge Plan and Services                                               Social Drivers of Health (SDOH) Interventions SDOH Screenings   Food Insecurity: No Food Insecurity (01/24/2024)  Housing: Low Risk (01/24/2024)  Transportation Needs: No Transportation Needs (01/24/2024)  Utilities: Not At Risk (01/24/2024)  Alcohol Screen: Low Risk (07/12/2022)  Tobacco Use: High Risk (01/25/2024)    Readmission Risk Interventions    01/25/2024    1:22 PM 02/23/2023    3:36 PM  Readmission Risk Prevention Plan   Transportation Screening Complete Complete  Medication Review (RN Care Manager) Complete Complete  PCP or Specialist appointment within 3-5 days of discharge Complete Complete  HRI or Home Care Consult Complete   SW Recovery Care/Counseling Consult Complete Complete  Palliative Care Screening Not Applicable Not Applicable  Skilled Nursing Facility Not Applicable Not Applicable

## 2024-01-31 NOTE — ED Provider Notes (Signed)
 Emergency Medicine Observation Re-evaluation Note  Henry Munoz is a 58 y.o. male, seen on rounds today.  Pt initially presented to the ED for complaints of Fall Currently, the patient is resting comfortably  Physical Exam  BP 128/85   Pulse 70   Temp 98.2 F (36.8 C) (Oral)   Resp 18   SpO2 98%  Physical Exam General: No acute distress Cardiac: Well-perfused Lungs: No respiratory distress   ED Course / MDM  EKG:   I have reviewed the labs performed to date as well as medications administered while in observation.  Recent changes in the last 24 hours include patient required help sleeping and received Ativan  and melatonin.  Plan  Current plan is for safe discharge however patient does have capacity to make decisions, awaiting plan per [facility] but patient remains in ED care    Nicholaus Rolland BRAVO, MD 01/31/24 [phone]

## 2024-01-31 NOTE — ED Notes (Signed)
 Pt visualized smoking a cigarette - taken and discarded into water cup, security called.

## 2024-01-31 NOTE — ED Notes (Addendum)
 SABRA

## 2024-01-31 NOTE — TOC Progression Note (Signed)
 Transition of Care [facility]) - Progression Note    Patient Details  Name: Henry Munoz MRN: [id number] Date of Birth: 13-Dec-1965  Transition of Care [facility]) CM/SW Contact  Ladarian Bonczek L Cyanne [place], [place] Phone Number: 01/31/2024, 12:15 PM  Clinical Narrative:     CSW spoke with DSS APS Supervisor, [employer] Kailee advised that the power has been off for a month. Jannis advised that she visited the home with another supervisor on Friday. She stated that there is a generator which only powers David's room. Alm has a refrigerator and reported that he uses [employer] or friends/family bring food. The generator is gas powered. The rest of the home is cold and there is no electricity. Kailee advised that there is no protective order and DSS understands if patient has to be discharged but stated that Kaya will most likely return to the ED.   Kailee advised that patients mother does not reside in the home as Alm reported. She stated that [employer] has guardianship of Ms. Rattler. She advised that [employer] has a long history with this family. She advised that as early as Monday, she can file a protective order but they have encouraged patient to go to SNF as it is appropriate vs him returning home, falling and causing serious injury.   Jannis advised that Alm is in no position to provide care or assistance to anyone. She advised that Alm has an open wound on his abdomen. He can ambulate with a walker and he should be on oxygen. She stated that when she visited the home on Friday, she observed that Alm did not use oxygen at all. She stated that he was able to ambulate for her but he got winded pretty fast.   There is a godbrother, Franky, who resides in the home but he is primarily assists Alm and not the patient. Alm has in-home care 7x a week from an agency.   Regarding the electricity, Alm paid $8000.99 towards the bill. There is a balance of $658.15 owed. DSS has committed to paying $600.00 and Alm is  still responsible for $158.15. Jannis advised that Alm has to complete paperwork so that DSS can pay and he has not done this yet. CSW informed Jannis that [facility] has initiated insurance authorization.   Jannis reiterated that they understand if patient is discharged to the home as he makes and can make his own decisions. She advised that she does not feel it is safe or appropriate. Jannis advised that DSS will continue to follow-up with Alm and the patient if patient is discharged to the home.                     Expected Discharge Plan and Services                                               Social Drivers of Health (SDOH) Interventions SDOH Screenings   Food Insecurity: No Food Insecurity (01/24/2024)  Housing: Low Risk (01/24/2024)  Transportation Needs: No Transportation Needs (01/24/2024)  Utilities: Not At Risk (01/24/2024)  Alcohol Screen: Low Risk (07/12/2022)  Tobacco Use: High Risk (01/25/2024)    Readmission Risk Interventions    01/25/2024    1:22 PM 02/23/2023    3:36 PM  Readmission Risk Prevention Plan  Transportation Screening Complete Complete  Medication Review (RN  Care Manager) Complete Complete  PCP or Specialist appointment within 3-5 days of discharge Complete Complete  HRI or Home Care Consult Complete   SW Recovery Care/Counseling Consult Complete Complete  Palliative Care Screening Not Applicable Not Applicable  Skilled Nursing Facility Not Applicable Not Applicable

## 2024-01-31 NOTE — ED Notes (Signed)
 Patient needed maximal assistance to move in bed by two RN's. Patient was unable to move hips on his own. Patient was sat up to eat breakfast but side rail had to remain up due to patient leaning to the right.

## 2024-02-01 LAB — CBG MONITORING, ED
Glucose-Capillary: 128 mg/dL — ABNORMAL HIGH (ref 70–99)
Glucose-Capillary: 99 mg/dL (ref 70–99)

## 2024-02-01 NOTE — ED Provider Notes (Signed)
 Emergency Medicine Observation Re-evaluation Note  Henry Munoz is a 58 y.o. male, seen on rounds today.  Pt initially presented to the ED for complaints of Fall  Currently, the patient is calm, no acute complaints.  Physical Exam  Blood pressure (!) 143/84, pulse 61, temperature 98.6 F (37 C), temperature source Oral, resp. rate 16, height 5' 8 (1.727 m), weight 117.9 kg, SpO2 96%. Physical Exam General: NAD Lungs: CTAB Psych: not agitated  ED Course / MDM  EKG:    I have reviewed the labs performed to date as well as medications administered while in observation.  Recent changes in the last 24 hours include no acute events overnight.    Plan  Current plan is for TOC placement.   Viviann Pastor, MD 02/01/24 [phone]

## 2024-02-01 NOTE — ED Notes (Signed)
 Pt shoes given to transport on way out the door.

## 2024-02-01 NOTE — ED Provider Notes (Signed)
 Discharged toEmergency Medicine Observation Re-evaluation Note  Henry Munoz is a 58 y.o. male, seen on rounds today.  Pt initially presented to the ED for complaints of Fall  Currently, the patient is calm, no acute complaints.  Physical Exam  Blood pressure (!) 143/84, pulse 61, temperature 98.6 F (37 C), temperature source Oral, resp. rate 16, height 5' 8 (1.727 m), weight 117.9 kg, SpO2 96%. Physical Exam General: NAD Lungs: CTAB Psych: not agitated  ED Course / MDM  EKG:    I have reviewed the labs performed to date as well as medications administered while in observation.  Recent changes in the last 24 hours include no acute events overnight.    Plan  Current plan is for discharge to facility today   Viviann Pastor, MD 02/01/24 1359

## 2024-02-01 NOTE — ED Notes (Signed)
 [facility]  called  for  transport  to  [facility]

## 2024-02-01 NOTE — ED Notes (Signed)
 Pt given lunch tray, sitting up in bed eating at this time.

## 2024-02-01 NOTE — ED Provider Notes (Signed)
----------------------------------------- °  6:02 AM on 02/01/2024 -----------------------------------------   Blood pressure 129/83, pulse 75, temperature 98.6 F (37 C), temperature source Oral, resp. rate 18, height 5' 8 (1.727 m), weight 117.9 kg, SpO2 98%.  The patient is calm and cooperative at this time.  There have been no acute events since the last update.  Awaiting disposition plan from case management/social work.    Teneka Malmberg, Josette SAILOR, DO 02/01/24 [phone]

## 2024-02-01 NOTE — ED Provider Notes (Signed)
 DISCHARGE SUMMARY       Henry Munoz FMW:[id number] DOB: 1965-03-19 DOA: 01/23/2024   PCP: [facility]   ED Discharge date: 02/01/2024     Recommendations for Outpatient Follow-up:  Follow up with PCP in 1-2 weeks to review chronic condition management Follow up with vascular surgery as scheduled     Hospital Course: Henry Munoz is a 58 year old male with history of pulmonary embolism nonadherent to his Eliquis , hypertension, chronic heart failure with preserved EF, cocaine abuse, COPD, insulin -dependent diabetes, schizoaffective disorder, prior CVA, who presented to the ED with right shoulder pain and frequent falls.  In the ED he was found to be tachycardic, blood pressure 160/102.  CTA showed multifocal pulmonary embolism with signs of right heart strain.  He was started on heparin  drip and vascular surgery was consulted.  He underwent thrombectomy on 12/16. By reevaluation on 12/17 patient was back to his baseline and requesting for discharge home. We extensively discussed the importance of staying on anticoagulation.  We also discussed the importance of smoking cessation and discontinuing cocaine abuse.  Patient endorses understanding but has little interest in cessation of the substances. Meds to beds delivered diabetes medications and Eliquis  prior to discharge.  A walker was ordered per patient request.   Massive pulmonary embolism Recurrent pulmonary embolism - Has been nonadherent to his Eliquis  - Status post thrombectomy 12/16 with vascular surgery -Initially on heparin  drip.  Have discontinued this in favor of Eliquis .  Eliquis  to be delivered to bedside few months prior to DC - Dopplers negative - Echocardiogram finished, read still pending.  Completed, read still pending   COPD, not currently in exacerbation - Presently stable on room air - Continue with home meds   Hypertension Chronic heart failure with preserved EF - Clinically  euvolemic at this time -Resume home meds   Insulin -dependent diabetes with hyperglycemia Noncompliant with insulin  regimen - Hemoglobin A1c 12.7% -Lantus  and aspart sent to pharmacy prior to DC.  Diabetes educator has worked with patient   Polysubstance abuse - Still currently using cocaine - Have educated on cessation   Tobacco abuse - Patient denies consideration of discontinuation   History of CVA - Resume statin   Schizoaffective disorder - Continue Abilify      Discharge Instructions   Discharge Instructions       Call MD for:  difficulty breathing, headache or visual disturbances   Complete by: As directed      Call MD for:  persistant dizziness or light-headedness   Complete by: As directed      Call MD for:  persistant nausea and vomiting   Complete by: As directed      Call MD for:  severe uncontrolled pain   Complete by: As directed      Call MD for:  temperature >100.4   Complete by: As directed      Diet general   Complete by: As directed      Discharge instructions   Complete by: As directed      Follow up with your primary care doctor within 1 month and vascular surgeon as scheduled.    Increase activity slowly   Complete by: As directed           Allergies as of 01/25/2024         Reactions    Haldol  [haloperidol  Lactate]              Medication List       STOP  taking these medications     apixaban  5 MG Tabs tablet Commonly known as: ELIQUIS  Replaced by: Apixaban  Starter Pack (10mg  and 5mg )    insulin  aspart 100 UNIT/ML FlexPen Commonly known as: NOVOLOG     ketoconazole 2 % cream Commonly known as: NIZORAL           TAKE these medications     albuterol  108 (90 Base) MCG/ACT inhaler Commonly known as: VENTOLIN  HFA Inhale 2-4 puffs into the lungs every 4 (four) hours as needed for wheezing or shortness of breath.    amLODipine  10 MG tablet Commonly known as: NORVASC  Take 1 tablet (10 mg total) by mouth daily.    Apixaban   Starter Pack (10mg  and 5mg ) Commonly known as: ELIQUIS  STARTER PACK Take as directed on package: start with two-5mg  tablets twice daily for 7 days. On day 8, switch to one-5mg  tablet twice daily. Replaces: apixaban  5 MG Tabs tablet    apixaban  5 MG Tabs tablet Commonly known as: ELIQUIS  Take 1 tablet (5 mg total) by mouth 2 (two) times daily. Start taking on: February 25, 2024    ARIPiprazole  15 MG tablet Commonly known as: ABILIFY  Take 1 tablet (15 mg total) by mouth daily.    atorvastatin  40 MG tablet Commonly known as: LIPITOR Take 1 tablet (40 mg total) by mouth daily.    Baqsimi  Two Pack 3 MG/DOSE Powd Generic drug: Glucagon  Place 3 mg into the nose as needed for up to 2 doses (Severe low blood sugar). Give 3 mg (one actuation) into a single nostril.    Blood Glucose Monitoring Suppl Devi 1 each by Does not apply route as directed. Dispense based on patient and insurance preference. Use up to four times daily as directed. (FOR ICD-10 E10.9, E11.9). What changed:  when to take this additional instructions    BLOOD GLUCOSE TEST STRIPS Strp 1 each by Does not apply route 3 (three) times daily. Use as directed to check blood sugar. May dispense any manufacturer covered by patient's insurance and fits patient's device. What changed: Another medication with the same name was added. Make sure you understand how and when to take each.    BLOOD GLUCOSE TEST STRIPS Strp 1 each by Does not apply route as directed. Dispense based on patient and insurance preference. Use up to four times daily as directed. (FOR ICD-10 E10.9, E11.9). What changed: You were already taking a medication with the same name, and this prescription was added. Make sure you understand how and when to take each.    gabapentin  600 MG tablet Commonly known as: NEURONTIN  Take 600 mg by mouth 3 (three) times daily.    insulin  glargine 100 UNIT/ML Solostar Pen Commonly known as: LANTUS  Inject 10 Units into the skin  daily. May substitute as needed per insurance. What changed:  how much to take additional instructions    insulin  lispro 100 UNIT/ML KwikPen Commonly known as: HUMALOG  Inject 0-6 Units into the skin 3 (three) times daily with meals. Check Blood Glucose (BG) and inject per scale: BG <150= 0 unit; BG 150-200= 1 unit; BG 201-250= 2 unit; BG 251-300= 3 unit; BG 301-350= 4 unit; BG 351-400= 5 unit; BG >400= 6 unit and Call Primary Care.    Lancet Device Misc 1 each by Does not apply route as directed. Dispense based on patient and insurance preference. Use up to four times daily as directed. (FOR ICD-10 E10.9, E11.9). What changed:  when to take this additional instructions    Lancets Misc  1 each by Does not apply route as directed. Dispense based on patient and insurance preference. Use up to four times daily as directed. (FOR ICD-10 E10.9, E11.9). What changed:  when to take this additional instructions    Pen Needles 31G X 5 MM Misc 1 each by Does not apply route as directed. Dispense based on patient and insurance preference. Use up to four times daily as directed. (FOR ICD-10 E10.9, E11.9). What changed:  when to take this additional instructions    Trelegy Ellipta  100-62.5-25 MCG/ACT Aepb Generic drug: Fluticasone-Umeclidin-Vilant Inhale 1 application  into the lungs daily.            Viviann Pastor, MD 02/01/24 [phone]

## 2024-02-01 NOTE — ED Notes (Signed)
 [facility]  reference # [id number]

## 2024-02-01 NOTE — TOC Transition Note (Signed)
 Transition of Care [facility]) - Discharge Note   Patient Details  Name: Henry Munoz MRN: [id number] Date of Birth: 09/03/1965  Transition of Care [facility]) CM/SW Contact:  Sham Alviar L Levis Nazir, LCSW Phone Number: 02/01/2024, 12:37 PM   Clinical Narrative:     Patient is medically stable for discharge. CSW confirmed with [facility] that patient can discharge today. Patient is going to room 503B. Nurse Secretary asked to call [employer] to schedule transportation.   CSW met with patient and verbally advised that he is discharging to [facility] today.   No further TOC needs assessed. TOC signing off.         Patient Goals and CMS Choice            Discharge Placement                       Discharge Plan and Services Additional resources added to the After Visit Summary for                                       Social Drivers of Health (SDOH) Interventions SDOH Screenings   Food Insecurity: No Food Insecurity (01/24/2024)  Housing: Low Risk (01/24/2024)  Transportation Needs: No Transportation Needs (01/24/2024)  Utilities: Not At Risk (01/24/2024)  Alcohol Screen: Low Risk (07/12/2022)  Tobacco Use: High Risk (01/25/2024)     Readmission Risk Interventions    01/25/2024    1:22 PM 02/23/2023    3:36 PM  Readmission Risk Prevention Plan  Transportation Screening Complete Complete  Medication Review (RN Care Manager) Complete Complete  PCP or Specialist appointment within 3-5 days of discharge Complete Complete  HRI or Home Care Consult Complete   SW Recovery Care/Counseling Consult Complete Complete  Palliative Care Screening Not Applicable Not Applicable  Skilled Nursing Facility Not Applicable Not Applicable

## 2024-02-01 NOTE — ED Notes (Signed)
 Dietary delivering lunch, spilt cup of water on pt. Pt dried, new gown placed, warm blanket given.

## 2024-02-18 ENCOUNTER — Encounter: Payer: Self-pay | Admitting: Emergency Medicine

## 2024-02-18 ENCOUNTER — Emergency Department

## 2024-02-18 ENCOUNTER — Emergency Department
Admission: EM | Admit: 2024-02-18 | Discharge: 2024-02-19 | Disposition: A | Attending: Emergency Medicine | Admitting: Emergency Medicine

## 2024-02-18 ENCOUNTER — Other Ambulatory Visit: Payer: Self-pay

## 2024-02-18 DIAGNOSIS — W19XXXA Unspecified fall, initial encounter: Secondary | ICD-10-CM

## 2024-02-18 DIAGNOSIS — W06XXXA Fall from bed, initial encounter: Secondary | ICD-10-CM | POA: Insufficient documentation

## 2024-02-18 DIAGNOSIS — I10 Essential (primary) hypertension: Secondary | ICD-10-CM | POA: Diagnosis not present

## 2024-02-18 DIAGNOSIS — W01198A Fall on same level from slipping, tripping and stumbling with subsequent striking against other object, initial encounter: Secondary | ICD-10-CM | POA: Diagnosis not present

## 2024-02-18 DIAGNOSIS — S4992XA Unspecified injury of left shoulder and upper arm, initial encounter: Secondary | ICD-10-CM | POA: Diagnosis not present

## 2024-02-18 DIAGNOSIS — S0990XA Unspecified injury of head, initial encounter: Secondary | ICD-10-CM | POA: Insufficient documentation

## 2024-02-18 DIAGNOSIS — E1165 Type 2 diabetes mellitus with hyperglycemia: Secondary | ICD-10-CM | POA: Diagnosis not present

## 2024-02-18 DIAGNOSIS — Z794 Long term (current) use of insulin: Secondary | ICD-10-CM | POA: Diagnosis not present

## 2024-02-18 DIAGNOSIS — F17200 Nicotine dependence, unspecified, uncomplicated: Secondary | ICD-10-CM | POA: Diagnosis not present

## 2024-02-18 NOTE — ED Notes (Signed)
 Registration clerk notified me that pt was on the floor requesting a nurse. Pt was laying on his back pants falling down states he stood up to pull up his pants and fell.  No one heard pt fall or witness him fall. Pt was already here for a fall and was being called to go back to a room.  Pt was assisted off the floor and taken to room 47. Dr. Cyrena sent a message about pt falling.

## 2024-02-18 NOTE — ED Provider Notes (Signed)
 "  [facility] Provider Note    Event Date/Time   First MD Initiated Contact with Patient 02/18/24 2325     (approximate)   History   Fall   HPI  Henry Munoz is a 59 y.o. male   Past medical history of previous stroke and on a blood thinner for PE here with a fall.  His pants are ill fitting so he tried to adjust them at home and he fell striking his left shoulder.  Tried to do so again in the waiting room and fell hitting his head.  Had a prior stroke and left-sided deficits unchanged.  He walks with a cane.  No other acute medical illnesses or presyncopal symptoms.    External Medical Documents Reviewed: Previous hospital notes      Physical Exam   Triage Vital Signs: ED Triage Vitals  Encounter Vitals Group     BP 02/18/24 2147 (!) 153/98     Girls Systolic BP Percentile --      Girls Diastolic BP Percentile --      Boys Systolic BP Percentile --      Boys Diastolic BP Percentile --      Pulse Rate 02/18/24 2147 87     Resp 02/18/24 2147 18     Temp 02/18/24 2147 98.3 F (36.8 C)     Temp Source 02/18/24 2147 Oral     SpO2 02/18/24 2147 97 %     Weight 02/18/24 2148 260 lb (117.9 kg)     Height --      Head Circumference --      Peak Flow --      Pain Score --      Pain Loc --      Pain Education --      Exclude from Growth Chart --     Most recent vital signs: Vitals:   02/18/24 2343 02/19/24 0000  BP: 130/85 (!) 150/86  Pulse: 81 77  Resp: 18   Temp: 98.4 F (36.9 C)   SpO2: 95% 95%    General: Awake, no distress.  CV:  Good peripheral perfusion.  Resp:  Normal effort.  Abd:  No distention.  Other:  Pleasant man in no acute distress with normal vital signs answering all my questions appropriately.  No obvious external signs of injury to the head or neck.  Neck supple full range of motion.  Head to toe examination reveals point tenderness to the left shoulder and inability to range fully, but also with baseline  left-sided deficits due to stroke.  Neurovascular intact to the affected left upper extremity and passive range of motion of bilateral hips and knees do not elicit any pain.   ED Results / Procedures / Treatments   Labs (all labs ordered are listed, but only abnormal results are displayed) Labs Reviewed - No data to display   RADIOLOGY I independently reviewed and interpreted CT head and see no obvious bleeding  I also reviewed radiologist's formal read.   PROCEDURES:  Critical Care performed: No  Procedures   MEDICATIONS ORDERED IN ED: Medications - No data to display   IMPRESSION / MDM / ASSESSMENT AND PLAN / ED COURSE  I reviewed the triage vital signs and the nursing notes.                                Patient's presentation is most consistent with acute  presentation with potential threat to life or bodily function.  Differential diagnosis includes, but is not limited to, mechanical fall and blunt traumatic injury including injury of the left shoulder, head injury, ICH skull fracture fractures or dislocations    MDM:    Mechanical fall leading to head and shoulder injury with unremarkable imaging.  No other trauma noted by patient nor my exam.  Patient with no other acute medical complaints and appropriate for discharge.        FINAL CLINICAL IMPRESSION(S) / ED DIAGNOSES   Final diagnoses:  Fall, initial encounter  Injury of head, initial encounter  Injury of left shoulder, initial encounter     Rx / DC Orders   ED Discharge Orders     None        Note:  This document was prepared using Dragon voice recognition software and may include unintentional dictation errors.    Cyrena Mylar, MD 02/19/24 0401  "

## 2024-02-18 NOTE — ED Provider Notes (Incomplete)
 "  [facility] Provider Note    Event Date/Time   First MD Initiated Contact with Patient 02/18/24 2325     (approximate)   History   Fall   HPI  Shooter Tangen is a 59 y.o. male   Past medical history of ***    Independent Historian contributed to assessment above: ***  External Medical Documents Reviewed: ***      Physical Exam   Triage Vital Signs: ED Triage Vitals  Encounter Vitals Group     BP 02/18/24 2147 (!) 153/98     Girls Systolic BP Percentile --      Girls Diastolic BP Percentile --      Boys Systolic BP Percentile --      Boys Diastolic BP Percentile --      Pulse Rate 02/18/24 2147 87     Resp 02/18/24 2147 18     Temp 02/18/24 2147 98.3 F (36.8 C)     Temp Source 02/18/24 2147 Oral     SpO2 02/18/24 2147 97 %     Weight 02/18/24 2148 260 lb (117.9 kg)     Height --      Head Circumference --      Peak Flow --      Pain Score --      Pain Loc --      Pain Education --      Exclude from Growth Chart --     Most recent vital signs: Vitals:   02/18/24 2147  BP: (!) 153/98  Pulse: 87  Resp: 18  Temp: 98.3 F (36.8 C)  SpO2: 97%    General: Awake, no distress. *** CV:  Good peripheral perfusion. *** Resp:  Normal effort. *** Abd:  No distention. *** Other:  ***   ED Results / Procedures / Treatments   Labs (all labs ordered are listed, but only abnormal results are displayed) Labs Reviewed - No data to display   I ordered and reviewed the above labs they are notable for ***  EKG  ED ECG REPORT I, Ginnie Shams, the attending physician, personally viewed and interpreted this ECG.   Date: 02/18/2024  EKG Time: ***  Rate: ***  Rhythm: {ekg findings:315101}  Axis: ***  Intervals:{conduction defects:17367}  ST&T Change: ***    RADIOLOGY I independently reviewed and interpreted *** I also reviewed radiologist's formal read.   PROCEDURES:  Critical Care performed:  {CriticalCareYesNo:19197::Yes, see critical care procedure note(s),No}  Procedures   MEDICATIONS ORDERED IN ED: Medications - No data to display  External physician / consultants:  I spoke with *** regarding care plan for this patient.   IMPRESSION / MDM / ASSESSMENT AND PLAN / ED COURSE  I reviewed the triage vital signs and the nursing notes.                                Patient's presentation is most consistent with {EM COPA:27473}  Differential diagnosis includes, but is not limited to, ***   ***The patient is on the cardiac monitor to evaluate for evidence of arrhythmia and/or significant heart rate changes.  MDM:  ***  I considered hospitalization for admission or observation ***        FINAL CLINICAL IMPRESSION(S) / ED DIAGNOSES   Final diagnoses:  None     Rx / DC Orders   ED Discharge Orders     None  Note:  This document was prepared using Dragon voice recognition software and may include unintentional dictation errors.  "

## 2024-02-18 NOTE — ED Triage Notes (Addendum)
 Pt in from home via [facility] after fall slipping out of bed, takes Eliquis . Presents with pain to L upper arm, denies any head trauma or LOC.

## 2024-02-19 ENCOUNTER — Emergency Department

## 2024-02-19 ENCOUNTER — Encounter: Payer: Self-pay | Admitting: *Deleted

## 2024-02-19 ENCOUNTER — Emergency Department
Admission: EM | Admit: 2024-02-19 | Discharge: 2024-02-22 | Disposition: A | Discharge status: Rehabilitation Facility | Attending: Emergency Medicine | Admitting: Emergency Medicine

## 2024-02-19 ENCOUNTER — Other Ambulatory Visit: Payer: Self-pay

## 2024-02-19 DIAGNOSIS — S0990XA Unspecified injury of head, initial encounter: Secondary | ICD-10-CM | POA: Insufficient documentation

## 2024-02-19 DIAGNOSIS — F17200 Nicotine dependence, unspecified, uncomplicated: Secondary | ICD-10-CM | POA: Insufficient documentation

## 2024-02-19 DIAGNOSIS — Z7901 Long term (current) use of anticoagulants: Secondary | ICD-10-CM

## 2024-02-19 DIAGNOSIS — W01198A Fall on same level from slipping, tripping and stumbling with subsequent striking against other object, initial encounter: Secondary | ICD-10-CM | POA: Insufficient documentation

## 2024-02-19 DIAGNOSIS — I10 Essential (primary) hypertension: Secondary | ICD-10-CM | POA: Insufficient documentation

## 2024-02-19 DIAGNOSIS — E1165 Type 2 diabetes mellitus with hyperglycemia: Secondary | ICD-10-CM | POA: Insufficient documentation

## 2024-02-19 DIAGNOSIS — E119 Type 2 diabetes mellitus without complications: Secondary | ICD-10-CM

## 2024-02-19 DIAGNOSIS — Z794 Long term (current) use of insulin: Secondary | ICD-10-CM | POA: Insufficient documentation

## 2024-02-19 DIAGNOSIS — W19XXXA Unspecified fall, initial encounter: Secondary | ICD-10-CM

## 2024-02-19 MED ORDER — AMLODIPINE BESYLATE 5 MG PO TABS
10.0000 mg | ORAL_TABLET | Freq: Every day | ORAL | Status: DC
  Administered 2024-02-20 – 2024-02-22 (×4): 10 mg via ORAL
  Filled 2024-02-19 (×4): qty 2

## 2024-02-19 MED ORDER — ARIPIPRAZOLE 15 MG PO TABS
15.0000 mg | ORAL_TABLET | Freq: Every day | ORAL | Status: DC
  Administered 2024-02-20 – 2024-02-22 (×4): 15 mg via ORAL
  Filled 2024-02-19 (×4): qty 1

## 2024-02-19 MED ORDER — APIXABAN 5 MG PO TABS
5.0000 mg | ORAL_TABLET | Freq: Two times a day (BID) | ORAL | Status: DC
  Administered 2024-02-20 – 2024-02-22 (×6): 5 mg via ORAL
  Filled 2024-02-19 (×6): qty 1

## 2024-02-19 MED ORDER — APIXABAN (ELIQUIS) VTE STARTER PACK (10MG AND 5MG): 10.0000 mg | ORAL_TABLET | Freq: Every day | ORAL | Status: DC

## 2024-02-19 NOTE — ED Provider Notes (Signed)
 "  [facility] Provider Note    None    (approximate)   History   Chief Complaint: Fall   HPI  Henry Munoz is a 59 y.o. male with a history of diabetes, hypertension, schizoaffective disorder who was evaluated in the ED this morning, discharged, and on his way out of the ED he fell and hit his head.  He falls frequently.  Denies any pain currently.        Past Medical History:  Diagnosis Date   Alcohol abuse    Cocaine abuse (HCC)    Diabetes mellitus without complication (HCC)    Diastolic dysfunction    a. 06/2022 Echo (in setting of acute PE):  EF 50-55%, no rwma, mod-sev LVH, grI DD, mod reduced RV fxn, triv MR w/ MVP of post leaflet, ? bicuspid AoV w/ triv AI and Ao sclerosis.   Hypertension    Pulmonary embolism (HCC)    a. 06/2022 CTA: Large volume PE affecting all lobar braches w/ R heart strain-->s/p mech thrombectomy.   Schizo-affective schizophrenia (HCC)    Stroke [facility])    TIA (transient ischemic attack)    Tobacco abuse     Current Outpatient Rx   Order #: [id number] Class: Normal   Order #: [id number] Class: Normal   [START ON 02/25/2024] Order #: [id number] Class: Normal   Order #: [id number] Class: Normal   Order #: [id number] Class: Normal   Order #: [id number] Class: Normal   Order #: [id number] Class: Normal   Order #: [id number] Class: Normal   Order #: [id number] Class: Historical Med   Order #: [id number] Class: Normal   Order #: [id number] Class: Normal   Order #: [id number] Class: Normal   Order #: [id number] Class: Normal   Order #: [id number] Class: Normal   Order #: [id number] Class: Normal    Past Surgical History:  Procedure Laterality Date   PULMONARY THROMBECTOMY Bilateral 06/30/2022   Procedure: PULMONARY THROMBECTOMY;  Surgeon: Jama Cordella MATSU, MD;  Location: [facility] INVASIVE CV LAB;  Service: Cardiovascular;  Laterality: Bilateral;   PULMONARY THROMBECTOMY Bilateral 01/24/2024   Procedure: PULMONARY THROMBECTOMY;  Surgeon:  Jama Cordella MATSU, MD;  Location: [facility] INVASIVE CV LAB;  Service: Cardiovascular;  Laterality: Bilateral;    Physical Exam   Triage Vital Signs: ED Triage Vitals  Encounter Vitals Group     BP 02/19/24 1925 (!) 140/82     Girls Systolic BP Percentile --      Girls Diastolic BP Percentile --      Boys Systolic BP Percentile --      Boys Diastolic BP Percentile --      Pulse Rate 02/19/24 1925 78     Resp 02/19/24 1925 18     Temp 02/19/24 1925 98.1 F (36.7 C)     Temp Source 02/19/24 1925 Oral     SpO2 02/19/24 1925 99 %     Weight 02/19/24 1929 260 lb (117.9 kg)     Height 02/19/24 1929 5' 7 (1.702 m)     Head Circumference --      Peak Flow --      Pain Score 02/19/24 1928 8     Pain Loc --      Pain Education --      Exclude from Growth Chart --     Most recent vital signs: Vitals:   02/19/24 1925  BP: (!) 140/82  Pulse: 78  Resp: 18  Temp: 98.1 F (36.7 C)  SpO2: 99%    General: Awake, no distress.  CV:  Good peripheral  perfusion.  Regular rate rhythm Resp:  Normal effort.  Clear lungs Abd:  No distention.  Other:  Head atraumatic, no midline spinal tenderness   ED Results / Procedures / Treatments   Labs (all labs ordered are listed, but only abnormal results are displayed) Labs Reviewed - No data to display   EKG    RADIOLOGY CT head interpreted by me, negative for intracranial hemorrhage.  Radiology report reviewed CT cervical spine and maxillofacial unremarkable   PROCEDURES:  Procedures   MEDICATIONS ORDERED IN ED: Medications - No data to display   IMPRESSION / MDM / ASSESSMENT AND PLAN / ED COURSE  I reviewed the triage vital signs and the nursing notes.  DDx: Intracranial hemorrhage, C-spine fracture, facial fracture, mechanical fall  Patient's presentation is most consistent with acute presentation with potential threat to life or bodily function.  Patient presents with recurrent fall.  Had sublet or head trauma.  CT  imaging obtained which is negative.  Awaiting social work coordination of transport home.       FINAL CLINICAL IMPRESSION(S) / ED DIAGNOSES   Final diagnoses:  Minor head injury, initial encounter  Type 2 diabetes mellitus without complication, with long-term current use of insulin  (HCC)  Current use of long term anticoagulation     Rx / DC Orders   ED Discharge Orders     None        Note:  This document was prepared using Dragon voice recognition software and may include unintentional dictation errors.   Viviann Pastor, MD 02/22/24 1346  "

## 2024-02-19 NOTE — ED Triage Notes (Signed)
 Pt brought in via ems from home. Pt was discharged from [facility] today.  Pt states he tripped and fell hit a table with hammer.  Pt struck left side of face on table.  No loc  no vomiting.  No lac/abrasion.  Pt has neck pain.  Pt taking blood thinners  pt alert   pt on stretcher in triage.

## 2024-02-19 NOTE — ED Provider Notes (Signed)
----------------------------------------- °  5:30 AM on 02/19/2024 -----------------------------------------  Patient had transportation benefits but they have expired.  No friends or family to pick him up, not safe to go in a taxi.  Consulting TOC to see if additional benefits are available   Gordan Huxley, MD 02/19/24 [phone]

## 2024-02-19 NOTE — ED Notes (Addendum)
 This RN called pt's emergency contact to arrange transportation to pick patient up. Per Alm, pt's brother. He has no way to pick the pt up.

## 2024-02-19 NOTE — ED Notes (Signed)
 The pt was assisted with standing and using a urinal. The pt was placed in a clean brief and paper pants to wear home. The pt was able to stand and ambulate with assistance to [employer]. The pt's paper work and belongings with given to the [employer].

## 2024-02-19 NOTE — ED Notes (Signed)
 Pt to ED via EMS from home, pt was d/c from facility for fall and fell again. Pt reports he hit his head, pt is on eliquis . Denies LOC. Pt has small hematoma to left forehead. HR 79 99% RA 167/93 cbg 326. Pt has not taken any meds today

## 2024-02-19 NOTE — ED Notes (Signed)
 The pt per social worker Kenya will be transported home by [employer]. I spoke with the pt's brother Bishop who confirmed the home does have working electricity. The pt also confirmed he has a working walker at home.

## 2024-02-19 NOTE — Discharge Instructions (Addendum)
 Fortunately your evaluation in the emerged apartment did not show any emergency or life-threatening injuries like broken bones, dislocations, or bleeding inside of the head.  Take Tylenol  650 mg every 6 hours as needed for pain.  Use ice to the affected shoulder to help with pain and swelling.  Thank you for choosing us  for your health care today!  Please see your primary doctor this week for a follow up appointment.   If you have any new, worsening, or unexpected symptoms call your doctor right away or come back to the emergency department for reevaluation.  It was my pleasure to care for you today.   Ginnie EDISON Cyrena, MD   [facility] cannot continue to financially support transportation home. As a courtesy, we are covering the cost of your transportation on this occasion. In the future, if transportation home is needed and your insurance will not cover it, you (or your responsible party) will be financially responsible for arranging and paying for that transportation.  Adult Protective Services has been notified of your discharge, and they will continue to work with your family and implement any necessary services.

## 2024-02-19 NOTE — TOC Initial Note (Addendum)
 Transition of Care [facility]) - Initial/Assessment Note    Patient Details  Name: Camp Gopal MRN: [id number] Date of Birth: 1966/01/01  Transition [facility]) CM/SW Contact:    Altonio Schwertner L Octavian Godek, LCSW Phone Number: 02/19/2024, 8:58 AM  Clinical Narrative:                  [facility] consult received. Patient was allegedly discharged from [facility] yesterday. Per Chart review, transportation home was attempted however his benefit expired. CSW contacted APS to advise of patient discharge from SNF. Awaiting response. CSW inquired if there was a protective order preventing patient returning to the home.   TOC leadership contacted. TOC will pay for [facility] if it is truly safe for patient to return home. Last ED admission, there was no electricity in the home and APS had concerns.   CSW awaiting a response from APS.      9:04am: Response received from Sari Kerns, ACDSS. Sari advised that DSS is actively investigating. There is no current protective order.     9:12: Additional information received from Sari Kerns.    Yes, the house has electricity now.  When we initiated the case, Mr. Wesly Whisenant was paying the bill, and I believe we were able to assist as well.  The home is very clean and well maintained, excpet for the power being off.  We are also helping him apply for [facility] to possibly get better aides in place for him.  We feel like he has a friend that helps him but its not official.   Regarding the Medicaid, Sari advised:   I don't see any notes in his Medicaid tab that identifies that he no longer has Medicaid.  I am sure if we are working with him, we will ensure his Medicaid is up to date and active.  Not really sure about the transportation piece.    10:34am: CSW met with patient. No family at bedside. Patient advised that he thought he could get up and move around at home because he had a lot of PT at [facility]. He stated that he  knows now to pace himself and use his walker. Patient confirmed that he had a walker at home that he is able to use.     CSW advised patient that as a courtesy, [facility] is arranging transportation and covering associated costs due to his Medicaid benefit lacking transportation. CSW advised that patient will be responsible for costs related to transportation in the future.     Patient Goals and CMS Choice            Expected Discharge Plan and Services                                              Prior Living Arrangements/Services                       Activities of Daily Living      Permission Sought/Granted                  Emotional Assessment              Admission diagnosis:  Fall Patient Active Problem List   Diagnosis Date Noted   Morbid obesity (HCC) 01/25/2024   Acute hypoxic respiratory failure (HCC) 02/22/2023  Polysubstance abuse (HCC) 02/22/2023   (HFpEF) heart failure with preserved ejection fraction (HCC) 02/22/2023   COPD exacerbation (HCC) 02/21/2023   Acute respiratory failure with hypoxia (HCC) 02/21/2023   Dyslipidemia 02/21/2023   Diabetic neuropathy (HCC) 02/21/2023   History of pulmonary embolism 02/21/2023   History of CVA (cerebrovascular accident) 02/21/2023   Auditory hallucinations 07/11/2022   Suicidal ideation 07/11/2022   Cocaine use 07/04/2022   Schizophrenia (HCC) 07/03/2022   PE (pulmonary thromboembolism) (HCC) 07/02/2022   Positive blood culture 06/30/2022   Cocaine abuse (HCC) 06/30/2022   Uncontrolled type 2 diabetes mellitus with hyperglycemia, with long-term current use of insulin  (HCC) 06/30/2022   Pseudohyponatremia 06/30/2022   AKI (acute kidney injury) 06/30/2022   Hyperkalemia 06/30/2022   IV infiltration, initial encounter 06/30/2022   Pulmonary embolus (HCC) 06/29/2022   Neuropathy, arm, left 10/15/2017   Acute encephalopathy 10/10/2017   Opiate abuse, episodic (HCC) 10/10/2017   MDD  (major depressive disorder) 08/12/2016   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 [place] [address] Phone: [phone] Fax: [phone]  [id number] [place], [place] - [address] [address] [place] [address] Phone: [phone] Fax: [phone]  [facility] [address], [place] [facility] [address] [place] [address] Phone: [phone] Fax: [phone]     Social Drivers of Health (SDOH) Social History: SDOH Screenings   Food Insecurity: No Food Insecurity (01/24/2024)  Housing: Low Risk (01/24/2024)  Transportation Needs: No Transportation Needs (01/24/2024)  Utilities: Not At Risk (01/24/2024)  Alcohol Screen: Low Risk (07/12/2022)  Tobacco Use: High Risk (02/18/2024)   SDOH Interventions:     Readmission Risk Interventions    01/25/2024    1:22 PM 02/23/2023    3:36 PM  Readmission Risk Prevention Plan  Transportation Screening Complete Complete  Medication Review (RN Care Manager) Complete Complete  PCP or Specialist appointment within 3-5 days of discharge Complete Complete  HRI or Home Care Consult Complete   SW Recovery Care/Counseling Consult Complete Complete  Palliative Care Screening Not Applicable Not Applicable  Skilled Nursing Facility Not Applicable Not Applicable

## 2024-02-19 NOTE — ED Provider Notes (Signed)
.----------------------------------------- °  10:43 AM on 02/19/2024 -----------------------------------------  Patient signed out pending transportation back home.  Social work looked into his situation, he is okay to go.  Was advised to use a walker at home to prevent falls.  Will discharge.      Medications - No data to display   ED Discharge Orders     None      Final diagnoses:  Fall, initial encounter  Injury of head, initial encounter  Injury of left shoulder, initial encounter      Waymond Lorelle Cummins, MD 02/19/24 1043

## 2024-02-20 LAB — CBG MONITORING, ED
Glucose-Capillary: 146 mg/dL — ABNORMAL HIGH (ref 70–99)
Glucose-Capillary: 179 mg/dL — ABNORMAL HIGH (ref 70–99)

## 2024-02-20 NOTE — [place] FL2 (Signed)
 " [place]  MEDICAID FL2 LEVEL OF CARE FORM     IDENTIFICATION  Patient Name: Henry Munoz Birthdate: [birth year] Sex: male Admission Date (Current Location): 02/19/2024  [facility] and [place] Number:  Chiropodist and Address:         Provider Number: [id number]  Attending Physician Name and Address:  Fernand Rossie HERO, MD  Relative Name and Phone Number:       Current Level of Care: Hospital Recommended Level of Care: Skilled Nursing Facility Prior Approval Number:    Date Approved/Denied:   PASRR Number:    Discharge Plan: SNF    Current Diagnoses: Patient Active Problem List   Diagnosis Date Noted   Morbid obesity (HCC) 01/25/2024   Acute hypoxic respiratory failure (HCC) 02/22/2023   Polysubstance abuse (HCC) 02/22/2023   (HFpEF) heart failure with preserved ejection fraction (HCC) 02/22/2023   COPD exacerbation (HCC) 02/21/2023   Acute respiratory failure with hypoxia (HCC) 02/21/2023   Dyslipidemia 02/21/2023   Diabetic neuropathy (HCC) 02/21/2023   History of pulmonary embolism 02/21/2023   History of CVA (cerebrovascular accident) 02/21/2023   Auditory hallucinations 07/11/2022   Suicidal ideation 07/11/2022   Cocaine use 07/04/2022   Schizophrenia (HCC) 07/03/2022   PE (pulmonary thromboembolism) (HCC) 07/02/2022   Positive blood culture 06/30/2022   Cocaine abuse (HCC) 06/30/2022   Uncontrolled type 2 diabetes mellitus with hyperglycemia, with long-term current use of insulin  (HCC) 06/30/2022   Pseudohyponatremia 06/30/2022   AKI (acute kidney injury) 06/30/2022   Hyperkalemia 06/30/2022   IV infiltration, initial encounter 06/30/2022   Pulmonary embolus (HCC) 06/29/2022   Neuropathy, arm, left 10/15/2017   Acute encephalopathy 10/10/2017   Opiate abuse, episodic (HCC) 10/10/2017   MDD (major depressive disorder) 08/12/2016   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   Tobacco use disorder 08/10/2016    Cocaine use disorder, severe, dependence (HCC) 08/10/2016   Alcohol use disorder, moderate, dependence (HCC) 08/10/2016   Cerebrovascular accident (CVA) (HCC) 08/10/2016   Diabetes (HCC) 04/27/2016   Essential hypertension 02/19/2016    Orientation RESPIRATION BLADDER Height & Weight     Self, Time, Situation, Place  Normal Continent Weight: 117.9 kg Height:  5' 7 (170.2 cm)  BEHAVIORAL SYMPTOMS/MOOD NEUROLOGICAL BOWEL NUTRITION STATUS      Continent Diet  AMBULATORY STATUS COMMUNICATION OF NEEDS Skin   Extensive Assist Verbally Normal                       Personal Care Assistance Level of Assistance  Dressing Bathing Assistance: Limited assistance   Dressing Assistance: Limited assistance     Functional Limitations Info             SPECIAL CARE FACTORS FREQUENCY  PT (By licensed PT), OT (By licensed OT)     PT Frequency: 3X/Week OT Frequency: 3X/Week            Contractures Contractures Info: Not present    Additional Factors Info  Code Status Code Status Info: Full             Current Medications (02/20/2024):  This is the current hospital active medication list Current Facility-Administered Medications  Medication Dose Route Frequency Provider Last Rate Last Admin   amLODipine  (NORVASC ) tablet 10 mg  10 mg Oral Daily Viviann Pastor, MD   10 mg at 02/20/24 1030   apixaban  (ELIQUIS ) tablet 5 mg  5 mg Oral BID Viviann Pastor, MD   5 mg at 02/20/24 1030  ARIPiprazole  (ABILIFY ) tablet 15 mg  15 mg Oral Daily Viviann Pastor, MD   15 mg at 02/20/24 1030   Current Outpatient Medications  Medication Sig Dispense Refill   albuterol  (VENTOLIN  HFA) 108 (90 Base) MCG/ACT inhaler Inhale 2-4 puffs into the lungs every 4 (four) hours as needed for wheezing or shortness of breath. 18 g 2   amLODipine  (NORVASC ) 10 MG tablet Take 1 tablet (10 mg total) by mouth daily. 30 tablet 0   APIXABAN  (ELIQUIS ) VTE STARTER PACK (10MG  AND 5MG ) Take as directed on  package: start with two-5mg  tablets twice daily for 7 days. On [name] 8, switch to one-5mg  tablet twice daily. 74 each 0   atorvastatin  (LIPITOR) 40 MG tablet Take 1 tablet (40 mg total) by mouth daily. 30 tablet 0   gabapentin  (NEURONTIN ) 600 MG tablet Take 600 mg by mouth 3 (three) times daily.     traZODone  (DESYREL ) 100 MG tablet Take 100 mg by mouth at bedtime.     traZODone  (DESYREL ) 50 MG tablet Take 50 mg by mouth at bedtime.     [START ON 02/25/2024] apixaban  (ELIQUIS ) 5 MG TABS tablet Take 1 tablet (5 mg total) by mouth 2 (two) times daily. (Patient not taking: Reported on 02/20/2024) 60 tablet 1   ARIPiprazole  (ABILIFY ) 15 MG tablet Take 1 tablet (15 mg total) by mouth daily. 30 tablet 0   Blood Glucose Monitoring Suppl DEVI 1 each by Does not apply route as directed. Dispense based on patient and insurance preference. Use up to four times daily as directed. (FOR ICD-10 E10.9, E11.9). 1 each 0   Fluticasone-Umeclidin-Vilant (TRELEGY ELLIPTA ) 100-62.5-25 MCG/ACT AEPB Inhale 1 application  into the lungs daily. (Patient not taking: Reported on 01/23/2024) 30 each 2   Glucagon  (BAQSIMI  TWO PACK) 3 MG/DOSE POWD Place 3 mg into the nose as needed for up to 2 doses (Severe low blood sugar). Give 3 mg (one actuation) into a single nostril. (Patient not taking: Reported on 01/31/2024) 1 each 0   Glucose Blood (BLOOD GLUCOSE TEST STRIPS) STRP 1 each by Does not apply route 3 (three) times daily. Use as directed to check blood sugar. May dispense any manufacturer covered by patient's insurance and fits patient's device. 100 strip 2   Glucose Blood (BLOOD GLUCOSE TEST STRIPS) STRP 1 each by Does not apply route as directed. Dispense based on patient and insurance preference. Use up to four times daily as directed. (FOR ICD-10 E10.9, E11.9). 100 each 0   insulin  glargine (LANTUS ) 100 UNIT/ML Solostar Pen Inject 10 Units into the skin daily. May substitute as needed per insurance. (Patient not taking: Reported  on 01/26/2024) 15 mL 0   insulin  lispro (HUMALOG ) 100 UNIT/ML KwikPen Inject 0-6 Units into the skin 3 (three) times daily with meals. Check Blood Glucose (BG) and inject per scale: BG <150= 0 unit; BG 150-200= 1 unit; BG 201-250= 2 unit; BG 251-300= 3 unit; BG 301-350= 4 unit; BG 351-400= 5 unit; BG >400= 6 unit and Call Primary Care. (Patient not taking: Reported on 01/26/2024) 15 mL 0   Insulin  Pen Needle (PEN NEEDLES) 31G X 5 MM MISC 1 each by Does not apply route as directed. Dispense based on patient and insurance preference. Use up to four times daily as directed. (FOR ICD-10 E10.9, E11.9). 100 each 0     Discharge Medications: Please see discharge summary for a list of discharge medications.  Relevant Imaging Results:  Relevant Lab Results:   Additional Information: SS# [id number]  Victory Jackquline RAMAN, RN     "

## 2024-02-20 NOTE — ED Notes (Signed)
 This RN and Maegan, RN took pt to private room to assist with changing clothes. Pt now wearing blue paper shirt and blue paper pants. All belongings placed in single belongings back on back of bed - jacket, black toboggan, brown sweatshirt, cigarette package, pair of black slides. Pt given night-time snack - graham crackers, peanut butter, apple sauce, and shasta cola per request. Eating at this time.

## 2024-02-20 NOTE — TOC Initial Note (Signed)
 Transition of Care [facility]) - Initial/Assessment Note    Patient Details  Name: Henry Munoz MRN: [id number] Date of Birth: Mar 06, 1965  [facility]) CM/SW Contact:    Victory Jackquline RAMAN, RN Phone Number: 02/20/2024, 2:45 PM  Clinical Narrative:       1200: RNCM met with patient at the bedside, introduced myself and my role and explained that discharge planning would be discussed. PT and MD are recommending STR. RNCM informed the patient that [facility] will take him back. Patient stated that he in agreement with STR. He said at first that he didn't want to go back to [facility] because they are too strict. I explained to him that he went home yesterday after saying he was able to be alone at home and had to return the same Lovella Hardie secondary to falling again and that he needed PT to get stronger to be able to go home alone and he agreed to go back to [facility]. MD made aware. RNCM will continue to follow for discharge planning needs.   1230: RNCM received a message from [place] with [facility] informing me that they were going to initiate authorization.          Expected Discharge Plan: Skilled Nursing Facility Barriers to Discharge: Continued Medical Work up   Patient Goals and CMS Choice            Expected Discharge Plan and Services       Living arrangements for the past 2 months: Single Family Home                                      Prior Living Arrangements/Services Living arrangements for the past 2 months: Single Family Home Lives with:: Self, Siblings Patient language and need for interpreter reviewed:: Yes Do you feel safe going back to the place where you live?: Yes      Need for Family Participation in Patient Care: Yes (Comment) Care giver support system in place?: Yes (comment) Current home services: DME Criminal Activity/Legal Involvement Pertinent to Current Situation/Hospitalization: No - Comment as needed  Activities of Daily  Living      Permission Sought/Granted                  Emotional Assessment Appearance:: Appears stated age Attitude/Demeanor/Rapport: Gracious Affect (typically observed): Calm, Adaptable, Quiet, Pleasant Orientation: : Oriented to Self, Oriented to Place, Oriented to Situation Alcohol / Substance Use: Not Applicable Psych Involvement: No (comment)  Admission diagnosis:  Fall - EMS Patient Active Problem List   Diagnosis Date Noted   Morbid obesity (HCC) 01/25/2024   Acute hypoxic respiratory failure (HCC) 02/22/2023   Polysubstance abuse (HCC) 02/22/2023   (HFpEF) heart failure with preserved ejection fraction (HCC) 02/22/2023   COPD exacerbation (HCC) 02/21/2023   Acute respiratory failure with hypoxia (HCC) 02/21/2023   Dyslipidemia 02/21/2023   Diabetic neuropathy (HCC) 02/21/2023   History of pulmonary embolism 02/21/2023   History of CVA (cerebrovascular accident) 02/21/2023   Auditory hallucinations 07/11/2022   Suicidal ideation 07/11/2022   Cocaine use 07/04/2022   Schizophrenia (HCC) 07/03/2022   PE (pulmonary thromboembolism) (HCC) 07/02/2022   Positive blood culture 06/30/2022   Cocaine abuse (HCC) 06/30/2022   Uncontrolled type 2 diabetes mellitus with hyperglycemia, with long-term current use of insulin  (HCC) 06/30/2022   Pseudohyponatremia 06/30/2022   AKI (acute kidney injury) 06/30/2022   Hyperkalemia 06/30/2022   IV  infiltration, initial encounter 06/30/2022   Pulmonary embolus (HCC) 06/29/2022   Neuropathy, arm, left 10/15/2017   Acute encephalopathy 10/10/2017   Opiate abuse, episodic (HCC) 10/10/2017   MDD (major depressive disorder) 08/12/2016   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 [place] [address] Phone: [phone] Fax: [phone]  [id number] - [place], [place] - [address] [place] [address] Phone: [phone] Fax: [phone]  [facility] [address], [place] - [address] [place] [address] Phone: [phone] Fax: [phone]     Social Drivers of Health (SDOH) Social History: SDOH Screenings   Food Insecurity: No Food Insecurity (01/24/2024)  Housing: Low Risk (01/24/2024)  Transportation Needs: No Transportation Needs (01/24/2024)  Utilities: Not At Risk (01/24/2024)  Alcohol Screen: Low Risk (07/12/2022)  Tobacco Use: High Risk (02/19/2024)   SDOH Interventions:     Readmission Risk Interventions    01/25/2024    1:22 PM 02/23/2023    3:36 PM  Readmission Risk Prevention Plan  Transportation Screening Complete Complete  Medication Review (RN Care Manager) Complete Complete  PCP or Specialist appointment within 3-5 days of discharge Complete Complete  HRI or Home Care Consult Complete   SW Recovery Care/Counseling Consult Complete Complete  Palliative Care Screening Not Applicable Not Applicable  Skilled Nursing Facility Not Applicable Not Applicable

## 2024-02-20 NOTE — ED Notes (Signed)
 Lunch tray provided to patient.

## 2024-02-20 NOTE — Evaluation (Signed)
 Occupational Therapy Evaluation Patient Details Name: Henry Munoz MRN: [id number] DOB: Apr 19, 1965 Today's Date: 02/20/2024   History of Present Illness   presented to secondary to recurrent falls in home environment (hitting head, L elbow/UE); imaging and medical work-up largely unrevealing.     Clinical Impressions Pt was seen for OT evaluation this date. Prior to hospital admission, pt was very recently at rehab where pt reports he was working with therapy. Pt reports that he lives with his brother and typically able to complete bathing and dressing without assist. Pt reports holding on to anything while walking, denies having a RW, and reports using SPC sometimes when asked about it. Pt presents with deficits in safety/deficit awareness, balance, functional LUE use, and activity tolerance limiting their ability to perform ADL management at baseline level. Pt currently requires MOD A for bed mobility, MIN A for ADL transfers with RW, MIN A for ADL mobility with RW requiring MAX VC throughout session to keep RW with him. He demo's decreased ability to maintain LUE grasp on RW especially with dual tasks. Pt requires MIN-MOD A for LB ADL tasks. Pt would benefit from skilled OT services to address noted impairments and functional limitations (see below for any additional details) in order to maximize safety and independence while minimizing future risk of falls, injury, and readmission. Anticipate the need for follow up OT services upon acute hospital DC.     If plan is discharge home, recommend the following:   A lot of help with walking and/or transfers;A lot of help with bathing/dressing/bathroom;Help with stairs or ramp for entrance;Direct supervision/assist for medications management;Assist for transportation;Assistance with cooking/housework     Functional Status Assessment   Patient has had a recent decline in their functional status and demonstrates the ability to make  significant improvements in function in a reasonable and predictable amount of time.     Equipment Recommendations   Other (comment) (defer, may benefit from w/c)     Recommendations for Other Services         Precautions/Restrictions   Precautions Precautions: Fall Recall of Precautions/Restrictions: Impaired Restrictions Weight Bearing Restrictions Per Provider Order: No     Mobility Bed Mobility Overal bed mobility: Needs Assistance Bed Mobility: Supine to Sit, Sit to Supine     Supine to sit: Mod assist Sit to supine: Mod assist   General bed mobility comments: asssist for LE management, truncal elevation    Transfers Overall transfer level: Needs assistance Equipment used: Rolling walker (2 wheels) Transfers: Sit to/from Stand Sit to Stand: Min assist           General transfer comment: increased time/effort for L UE placement on RW; excessive weight shift to R LE with movement transitions      Balance Overall balance assessment: Needs assistance Sitting-balance support: No upper extremity supported, Feet supported Sitting balance-Leahy Scale: Fair Sitting balance - Comments: Lists L with fatigue, divided attention   Standing balance support: Bilateral upper extremity supported Standing balance-Leahy Scale: Fair                             ADL either performed or assessed with clinical judgement   ADL Overall ADL's : Needs assistance/impaired     Grooming: Standing;Wash/dry hands;Contact guard assist Grooming Details (indicate cue type and reason): pushes RW to the side despite cues             Lower Body Dressing: Sit to/from stand;Minimal assistance;Cueing  for safety;Cueing for sequencing Lower Body Dressing Details (indicate cue type and reason): MIN A to slide slip on sandals on - cues to adjust L foot for proper fit to minimize risk of falls before walking; anticipate MODA  for LB dressing otherwise     Toileting-  Clothing Manipulation and Hygiene: Minimal assistance;Sit to/from stand Toileting - Clothing Manipulation Details (indicate cue type and reason): Pt stood at toilet to urinate with moderate spillage/spray outside of the toilet, MIN A for clothing mgt afterwards     Functional mobility during ADLs: Minimal assistance;Contact guard assist;Cueing for safety;Cueing for sequencing;Rolling walker (2 wheels)       Vision         Perception         Praxis         Pertinent Vitals/Pain Pain Assessment Pain Assessment: No/denies pain     Extremity/Trunk Assessment Upper Extremity Assessment Upper Extremity Assessment: LUE deficits/detail LUE Deficits / Details: grossly 2+ to 3-/5 throughout; increased time/effort for movement throughout fully, available ROM (though lacking approx 25-50% active/passive ROM L shoulder, elbow, forearm and wrist) LUE Coordination: decreased fine motor;decreased gross motor   Lower Extremity Assessment Lower Extremity Assessment: LLE deficits/detail LLE Deficits / Details: grossly 3-/5 throughout; increased time, effort to initiate   Cervical / Trunk Assessment Cervical / Trunk Assessment:  (mild elongation of L lateral trunk in unsupported sitting; lists L with fatigue, divided attention (requiring cuing for correction))   Communication Communication Communication: No apparent difficulties   Cognition Arousal: Alert Behavior During Therapy: WFL for tasks assessed/performed Cognition: Cognition impaired     Awareness: Intellectual awareness impaired, Online awareness impaired       OT - Cognition Comments: very poor insight into deficits and safety                 Following commands: Intact Following commands impaired: Only follows one step commands consistently     Cueing  General Comments   Cueing Techniques: Verbal cues;Tactile cues;Visual cues      Exercises     Shoulder Instructions      Home Living Family/patient  expects to be discharged to:: Private residence Living Arrangements: Other relatives Available Help at Discharge: Family;Personal care attendant;Available PRN/intermittently Type of Home: House Home Access: Ramped entrance     Home Layout: One level               Home Equipment: Agricultural Consultant (2 wheels);Cane - quad;Shower seat;Grab bars - tub/shower   Additional Comments: pt reports brother is disabled, they have an aid 2.5 hours/day for cooking/cleaning (information is per prior admission)      Prior Functioning/Environment Prior Level of Function : Needs assist;History of Falls (last six months)             Mobility Comments: Reports mod indep with household mobility-intermittent (inconsistent) use of RW/SPC. Multiple recent falls (at least 3-4 since discharge from STR 2-3 days prior)      OT Problem List: Decreased strength;Decreased range of motion;Decreased activity tolerance;Impaired balance (sitting and/or standing);Decreased coordination;Decreased cognition;Decreased safety awareness;Decreased knowledge of use of DME or AE;Impaired UE functional use   OT Treatment/Interventions: Self-care/ADL training;Therapeutic exercise;Energy conservation;DME and/or AE instruction;Therapeutic activities;Patient/family education;Balance training      OT Goals(Current goals can be found in the care plan section)   Acute Rehab OT Goals Patient Stated Goal: stop falling OT Goal Formulation: With patient Time For Goal Achievement: 03/05/24 Potential to Achieve Goals: Fair ADL Goals Pt Will Perform Upper Body  Dressing: with modified independence;sitting Pt Will Perform Lower Body Dressing: with modified independence;sit to/from stand Pt Will Transfer to Toilet: with modified independence;ambulating (LRAD) Pt Will Perform Toileting - Clothing Manipulation and hygiene: with modified independence;sit to/from stand;sitting/lateral leans Additional ADL Goal #1: Pt will utilize AD  with MIN-MOD VC for safety during ADL/mobility tasks, 4/4 opportunities.   OT Frequency:  Min 2X/week    Co-evaluation              AM-PAC OT 6 Clicks Daily Activity     Outcome Measure Help from another person eating meals?: None Help from another person taking care of personal grooming?: A Little Help from another person toileting, which includes using toliet, bedpan, or urinal?: A Lot Help from another person bathing (including washing, rinsing, drying)?: A Lot Help from another person to put on and taking off regular upper body clothing?: A Lot Help from another person to put on and taking off regular lower body clothing?: A Lot 6 Click Score: 15   End of Session Equipment Utilized During Treatment: Rolling walker (2 wheels) Nurse Communication: Mobility status;Other (comment) (food)  Activity Tolerance: Patient tolerated treatment well Patient left: in bed;with call bell/phone within reach  OT Visit Diagnosis: Other abnormalities of gait and mobility (R26.89);Muscle weakness (generalized) (M62.81)                Time: 8642-8588 OT Time Calculation (min): 14 min Charges:  OT General Charges $OT Visit: 1 Visit OT Evaluation $OT Eval Low Complexity: 1 Low OT Treatments $Self Care/Home Management : 8-22 mins  Warren SAUNDERS., MPH, MS, OTR/L ascom [phone] 02/20/2024, 2:46 PM

## 2024-02-20 NOTE — Evaluation (Signed)
 Physical Therapy Evaluation Patient Details Name: Henry Munoz MRN: [id number] DOB: [birth year] Today's Date: 02/20/2024  History of Present Illness  presented to secondary to recurrent falls in home environment (hitting head, L elbow/UE); imaging and medical work-up largely unrevealing.  Clinical Impression  Patient resting in on stretcher upon arrival to ED bedspace.  Sleeping, but easily arousable to voice and light touch.  Oriented to self, location and general situation; follows commands, pleasant and cooperative. Notable deficits in safety awareness and overall insight into need for assist.  At baseline, lives with brother in single-story home with ramp access; ambulatory with assist device (intermittent/inconsistent use of RW/SPC).  Does endorse frequent falls.  Has aide assist 2.5 hours/day; no current HH per patient report.  Patient with noted baseline hemiparesis throughout L hemi-body, UE  > LE.  Does have active movement at all joints, but requires significantly increased time/effort for purposeful activation.  Tends to maintain L UE in flexed/fisted position if noted cued for relaxation. Currently requiring mod assist for bed mobility; min assist for sit/stand, basic transfers and gait (45') with RW. Demonstrates step to gait pattern leading with R LE; excessive/exaggerated weight shift to R to unweight/advance L LE (due to baseline hemiparesis). Limited dynamic balance; unsafe to attempt without RW and +1 at all times  Would benefit from skilled PT to address above deficits and promote optimal return to PLOF.; recommend post-acute PT follow up as indicated by interdisciplinary care team.     If patient/family unable to manage care post-rehab, may consider transition to more supportive living environment as appropriate.        If plan is discharge home, recommend the following: Assistance with cooking/housework;Direct supervision/assist for medications management;Direct  supervision/assist for financial management;Assist for transportation;Help with stairs or ramp for entrance;A little help with walking and/or transfers;A little help with bathing/dressing/bathroom   Can travel by private vehicle        Equipment Recommendations  (may benefit from [facility] evaluation for longer-term mobility assist)  Recommendations for Other Services       Functional Status Assessment Patient has had a recent decline in their functional status and/or demonstrates limited ability to make significant improvements in function in a reasonable and predictable amount of time     Precautions / Restrictions Precautions Precautions: Fall Recall of Precautions/Restrictions: Impaired Restrictions Weight Bearing Restrictions Per Provider Order: No      Mobility  Bed Mobility Overal bed mobility: Needs Assistance Bed Mobility: Supine to Sit, Sit to Supine     Supine to sit: Mod assist Sit to supine: Mod assist   General bed mobility comments: asssist for LE management, truncal elevation    Transfers Overall transfer level: Needs assistance Equipment used: Rolling walker (2 wheels) Transfers: Sit to/from Stand Sit to Stand: Min assist           General transfer comment: increased time/effort for L UE placement on RW; excessive weight shift to R LE with movement transitions    Ambulation/Gait Ambulation/Gait assistance: Min assist Gait Distance (Feet): 45 Feet Assistive device: Rolling walker (2 wheels)         General Gait Details: step to gait pattern leading with R LE; excessive/exaggerated weight shift to R to unweight/advance L LE (due to baseline hemiparesis).  Limited dynamic balance; unsafe to attempt without RW and +1 at all times  Stairs            Wheelchair Mobility     Tilt Bed    Modified Rankin (Stroke  Patients Only)       Balance Overall balance assessment: Needs assistance Sitting-balance support: No upper extremity supported,  Feet supported Sitting balance-Leahy Scale: Fair Sitting balance - Comments: Lists L with fatigue, divided attention   Standing balance support: Bilateral upper extremity supported Standing balance-Leahy Scale: Fair                               Pertinent Vitals/Pain Pain Assessment Pain Assessment: No/denies pain    Home Living Family/patient expects to be discharged to:: Private residence Living Arrangements: Other relatives Available Help at Discharge: Family;Personal care attendant;Available PRN/intermittently Type of Home: House Home Access: Ramped entrance       Home Layout: One level Home Equipment: [employer]ant (2 wheels);Cane - quad;Shower seat;Grab bars - tub/shower Additional Comments: pt reports brother is disabled, they have an aid 2.5 hours/day for cooking/cleaning (information is per prior admission)    Prior Function Prior Level of Function : Needs assist;History of Falls (last six months)             Mobility Comments: Reports mod indep with household mobility-intermittent (inconsistent) use of RW/SPC. Multiple recent falls (at least 3-4 since discharge from STR 2-3 days prior)       Extremity/Trunk Assessment   Upper Extremity Assessment Upper Extremity Assessment: LUE deficits/detail LUE Deficits / Details: grossly 2+ to 3-/5 throughout; increased time/effort for movement throughout fully, available ROM (though lacking approx 25-50% active/passive ROM L shoulder, elbow, forearm and wrist)    Lower Extremity Assessment Lower Extremity Assessment: LLE deficits/detail LLE Deficits / Details: grossly 3-/5 throughout; increased time, effort to initiate    Cervical / Trunk Assessment Cervical / Trunk Assessment:  (mild elongation of L lateral trunk in unsupported sitting; lists L with fatigue, divided attention (requiring cuing for correction))  Communication   Communication Communication: No apparent difficulties    Cognition  Arousal: Alert Behavior During Therapy: WFL for tasks assessed/performed   PT - Cognitive impairments: Safety/Judgement, Awareness                       PT - Cognition Comments: generally focused on going outside to smoke Following commands: Intact Following commands impaired: Only follows one step commands consistently     Cueing Cueing Techniques: Verbal cues, Tactile cues, Visual cues     General Comments      Exercises     Assessment/Plan    PT Assessment Patient needs continued PT services  PT Problem List Decreased strength;Decreased range of motion;Decreased activity tolerance;Decreased balance;Decreased mobility       PT Treatment Interventions DME instruction;Gait training;Functional mobility training;Balance training;Therapeutic exercise;Therapeutic activities;Neuromuscular re-education;Cognitive remediation;Patient/family education;Wheelchair mobility training    PT Goals (Current goals can be found in the Care Plan section)  Acute Rehab PT Goals Patient Stated Goal: to stop falling PT Goal Formulation: With patient Time For Goal Achievement: 03/05/24 Potential to Achieve Goals: Fair    Frequency Min 2X/week     Co-evaluation               AM-PAC PT 6 Clicks Mobility  Outcome Measure Help needed turning from your back to your side while in a flat bed without using bedrails?: A Little Help needed moving from lying on your back to sitting on the side of a flat bed without using bedrails?: A Lot Help needed moving to and from a bed to a chair (including a wheelchair)?: A Little  Help needed standing up from a chair using your arms (e.g., wheelchair or bedside chair)?: A Little Help needed to walk in hospital room?: A Little Help needed climbing 3-5 steps with a railing? : A Lot 6 Click Score: 16    End of Session Equipment Utilized During Treatment: Gait belt Activity Tolerance: Patient tolerated treatment well Patient left: in bed Nurse  Communication: Mobility status PT Visit Diagnosis: Muscle weakness (generalized) (M62.81);Unsteadiness on feet (R26.81)    Time: 8878-8850 PT Time Calculation (min) (ACUTE ONLY): 28 min   Charges:   PT Evaluation $PT Eval Moderate Complexity: 1 Mod   PT General Charges $$ ACUTE PT VISIT: 1 Visit         Maxwell Lemen H. Delores, PT, DPT, NCS 02/20/2024, 1:59 PM [phone]

## 2024-02-20 NOTE — ED Notes (Signed)
Pt resting at this time. Chest rise and fall noted

## 2024-02-20 NOTE — ED Notes (Signed)
 OT at bedside.

## 2024-02-20 NOTE — ED Notes (Signed)
 PT at bedside.

## 2024-02-20 NOTE — ED Provider Notes (Signed)
----------------------------------------- °  4:04 PM on 02/20/2024 -----------------------------------------   Blood pressure (!) 148/88, pulse 63, temperature 98.3 F (36.8 C), temperature source Oral, resp. rate 17, height 5' 7 (1.702 m), weight 117.9 kg, SpO2 99%.  The patient is calm and cooperative at this time.  There have been no acute events since the last update.  Awaiting disposition plan from case management/social work.  Patient to the emergency department for recurrent falls.  Needing authorization to go back to his facility.  Med rec was obtained.  Placed in observation for further evaluation and help with social work for disposition.  Home medications reordered.    Suzanne Kirsch, MD 02/20/24 [phone]

## 2024-02-21 LAB — CBG MONITORING, ED
Glucose-Capillary: 154 mg/dL — ABNORMAL HIGH (ref 70–99)
Glucose-Capillary: 184 mg/dL — ABNORMAL HIGH (ref 70–99)

## 2024-02-21 NOTE — ED Notes (Signed)
 Patient on phone with brother at this time.

## 2024-02-21 NOTE — ED Notes (Signed)
 Dinner tray provided to pt

## 2024-02-21 NOTE — ED Notes (Signed)
 EDP at [facility]. Denies SI.

## 2024-02-21 NOTE — ED Notes (Signed)
 Alert, NAD, calm, intermittently sleeping, mildly restless sleep, covered with blanket.

## 2024-02-21 NOTE — ED Notes (Addendum)
 Patient was trying to get out of bed and RN stopped patient and asked what they needed. Patient stated he wanted to call his brother. Patient was given phone to call brother.

## 2024-02-21 NOTE — ED Provider Notes (Addendum)
 Vitals:   02/21/24 1037 02/21/24 1045  BP:  (!) 178/91  Pulse:  72  Resp:  20  Temp: 98.2 F (36.8 C)   SpO2:  96%    Per prior MD - Patient to the emergency department for recurrent falls. Needing authorization to go back to his facility. Med rec was obtained. Placed in observation for further evaluation and help with social work for disposition.     At this juncture, ambulance transportation back to his care facility has been arranged by our [facility] team.  He is resting comfortably without distress.  He will be transported back to his care facility for ongoing care after his fall.  Return precautions and treatment recommendations and follow-up discussed with the patient who is agreeable with the plan.  He is resting comfortably and agreeable with the plan to discharge.  He would like something for lunch, nursing checking on that as we await discharge   Dicky Anes, MD 02/21/24 1220    Dicky Anes, MD 02/21/24 1223

## 2024-02-21 NOTE — TOC Transition Note (Signed)
 Transition of Care [facility]) - Discharge Note   Patient Details  Name: Henry Munoz MRN: [id number] Date of Birth: 1966-01-25  Transition of Care [facility]) CM/SW Contact:  Henry JAYSON Carpen, LCSW Phone Number: 02/21/2024, 2:38 PM   Clinical Narrative:   Patient has orders to discharge to [facility] today. RN has already called report. Medicaid will arrange transportation. CSW notified them that we have a contract with [employer] and provided the phone number. It will take about an hour to process and set up. No further concerns. CSW signing off.  Final next level of care: Skilled Nursing Facility Barriers to Discharge: Barriers Resolved   Patient Goals and CMS Choice     Choice offered to / list presented to : Patient      Discharge Placement   Existing PASRR number confirmed : 02/21/24 ([id number])          Patient chooses bed at: Other - please specify in the comment section below: [facility]) Patient to be transferred to facility by: Medicaid is arranging. Likely [employer]. Name of family member notified: Tarance Balan Patient and family notified of of transfer: 02/21/24  Discharge Plan and Services Additional resources added to the After Visit Summary for                                       Social Drivers of Health (SDOH) Interventions SDOH Screenings   Food Insecurity: No Food Insecurity (01/24/2024)  Housing: Low Risk (01/24/2024)  Transportation Needs: No Transportation Needs (01/24/2024)  Utilities: Not At Risk (01/24/2024)  Alcohol Screen: Low Risk (07/12/2022)  Tobacco Use: High Risk (02/19/2024)     Readmission Risk Interventions    01/25/2024    1:22 PM 02/23/2023    3:36 PM  Readmission Risk Prevention Plan  Transportation Screening Complete Complete  Medication Review (RN Care Manager) Complete Complete  PCP or Specialist appointment within 3-5 days of discharge Complete  Complete  HRI or Home Care Consult Complete   SW Recovery Care/Counseling Consult Complete Complete  Palliative Care Screening Not Applicable Not Applicable  Skilled Nursing Facility Not Applicable Not Applicable

## 2024-02-21 NOTE — TOC Progression Note (Addendum)
 Transition of Care [facility]) - Progression Note    Patient Details  Name: Henry Munoz MRN: [id number] Date of Birth: Jul 02, 1965  Transition of Care [facility]) CM/SW Contact  Lauraine JAYSON Carpen, LCSW Phone Number: 02/21/2024, 9:53 AM  Clinical Narrative:   Shara approved. [facility] SNF can accept patient today. Sent secure chat to MD and RN to notify.  12:23 pm: Updated patient.  Expected Discharge Plan: Skilled Nursing Facility Barriers to Discharge: Continued Medical Work up               Expected Discharge Plan and Services       Living arrangements for the past 2 months: Single Family Home                                       Social Drivers of Health (SDOH) Interventions SDOH Screenings   Food Insecurity: No Food Insecurity (01/24/2024)  Housing: Low Risk (01/24/2024)  Transportation Needs: No Transportation Needs (01/24/2024)  Utilities: Not At Risk (01/24/2024)  Alcohol Screen: Low Risk (07/12/2022)  Tobacco Use: High Risk (02/19/2024)    Readmission Risk Interventions    01/25/2024    1:22 PM 02/23/2023    3:36 PM  Readmission Risk Prevention Plan  Transportation Screening Complete Complete  Medication Review (RN Care Manager) Complete Complete  PCP or Specialist appointment within 3-5 days of discharge Complete Complete  HRI or Home Care Consult Complete   SW Recovery Care/Counseling Consult Complete Complete  Palliative Care Screening Not Applicable Not Applicable  Skilled Nursing Facility Not Applicable Not Applicable

## 2024-02-22 NOTE — ED Notes (Signed)
 Pt given warm blanket.

## 2024-02-22 NOTE — ED Notes (Signed)
 [facility]  called  by  sarah  boswell to  take pt  to transport  pt to  [place]  [facility]

## 2024-02-22 NOTE — ED Notes (Signed)
Pt ambulated to restroom with walker independently.

## 2024-02-22 NOTE — ED Provider Notes (Signed)
 Emergency Medicine Observation Re-evaluation Note   BP (!) 178/91 (BP Location: Right Wrist)   Pulse 72   Temp 98.2 F (36.8 C) (Oral)   Resp 20   Ht 5' 7 (1.702 m)   Wt 117.9 kg   SpO2 96%   BMI 40.72 kg/m    ED Course / MDM   No reported events during my shift at the time of this note.   Pt is awaiting dispo from consultants   Ginnie Shams MD    Shams Ginnie, MD 02/22/24 [phone]

## 2024-02-22 NOTE — ED Notes (Signed)
 Pt provided with breakfast tray.

## 2024-02-22 NOTE — TOC Transition Note (Signed)
 Transition of Care [facility]) - Discharge Note   Patient Details  Name: Henry Munoz MRN: [id number] Date of Birth: 1966/01/09  Transition of Care [facility]) CM/SW Contact:  Lauraine JAYSON Carpen, LCSW Phone Number: 02/22/2024, 8:58 AM   Clinical Narrative:   Transportation did not pick patient up yesterday. CSW called Medicaid again. They did not see the ride that was set up yesterday (Ref # [id number]). Liaison scheduled with [facility] and they will be here in 90 mins-2 hours to pick him up. SNF admissions coordinator is aware. CSW signing off.  Final next level of care: Skilled Nursing Facility Barriers to Discharge: Barriers Resolved   Patient Goals and CMS Choice     Choice offered to / list presented to : Patient      Discharge Placement   Existing PASRR number confirmed : 02/21/24 ([id number])          Patient chooses bed at: Other - please specify in the comment section below: [facility]) Patient to be transferred to facility by: Medicaid is arranging. Likely [employer]. Name of family member notified: Prestin Munch Patient and family notified of of transfer: 02/21/24  Discharge Plan and Services Additional resources added to the After Visit Summary for                                       Social Drivers of Health (SDOH) Interventions SDOH Screenings   Food Insecurity: No Food Insecurity (01/24/2024)  Housing: Low Risk (01/24/2024)  Transportation Needs: No Transportation Needs (01/24/2024)  Utilities: Not At Risk (01/24/2024)  Alcohol Screen: Low Risk (07/12/2022)  Tobacco Use: High Risk (02/19/2024)     Readmission Risk Interventions    01/25/2024    1:22 PM 02/23/2023    3:36 PM  Readmission Risk Prevention Plan  Transportation Screening Complete Complete  Medication Review (RN Care Manager) Complete Complete  PCP or Specialist appointment within 3-5 days of discharge Complete Complete  HRI or Home Care  Consult Complete   SW Recovery Care/Counseling Consult Complete Complete  Palliative Care Screening Not Applicable Not Applicable  Skilled Nursing Facility Not Applicable Not Applicable
